# Patient Record
Sex: Female | Born: 1950
Health system: Southern US, Community
[De-identification: ages and names within clinical notes are randomized; demographics above are authoritative.]

## PROBLEM LIST (undated history)

## (undated) DIAGNOSIS — C439 Malignant melanoma of skin, unspecified: Secondary | ICD-10-CM

## (undated) DIAGNOSIS — IMO0002 Reserved for concepts with insufficient information to code with codable children: Secondary | ICD-10-CM

## (undated) DIAGNOSIS — C4491 Basal cell carcinoma of skin, unspecified: Secondary | ICD-10-CM

## (undated) DIAGNOSIS — K602 Anal fissure, unspecified: Secondary | ICD-10-CM

## (undated) DIAGNOSIS — F419 Anxiety disorder, unspecified: Secondary | ICD-10-CM

## (undated) DIAGNOSIS — I341 Nonrheumatic mitral (valve) prolapse: Secondary | ICD-10-CM

## (undated) DIAGNOSIS — M545 Low back pain, unspecified: Secondary | ICD-10-CM

## (undated) DIAGNOSIS — L719 Rosacea, unspecified: Secondary | ICD-10-CM

## (undated) DIAGNOSIS — K5790 Diverticulosis of intestine, part unspecified, without perforation or abscess without bleeding: Secondary | ICD-10-CM

## (undated) DIAGNOSIS — E876 Hypokalemia: Secondary | ICD-10-CM

## (undated) DIAGNOSIS — D649 Anemia, unspecified: Secondary | ICD-10-CM

## (undated) HISTORY — PX: VEIN SURGERY: SHX48

## (undated) HISTORY — DX: Hypokalemia: E87.6

## (undated) HISTORY — DX: Anxiety disorder, unspecified: F41.9

## (undated) HISTORY — PX: BREAST BIOPSY: SHX20

## (undated) HISTORY — DX: Basal cell carcinoma of skin, unspecified: C44.91

## (undated) HISTORY — DX: Diverticulosis of intestine, part unspecified, without perforation or abscess without bleeding: K57.90

## (undated) HISTORY — DX: Anal fissure, unspecified: K60.2

## (undated) HISTORY — DX: Reserved for concepts with insufficient information to code with codable children: IMO0002

## (undated) HISTORY — DX: Low back pain, unspecified: M54.50

## (undated) HISTORY — DX: Malignant melanoma of skin, unspecified: C43.9

## (undated) HISTORY — DX: Nonrheumatic mitral (valve) prolapse: I34.1

## (undated) HISTORY — DX: Rosacea, unspecified: L71.9

## (undated) HISTORY — PX: TUBAL LIGATION: SHX77

## (undated) HISTORY — DX: Anemia, unspecified: D64.9

## (undated) HISTORY — DX: Low back pain: M54.5

---
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
## 1983-02-10 HISTORY — PX: ABDOMINAL HYSTERECTOMY: SHX81

## 1987-02-10 HISTORY — PX: REPAIR KNEE LIGAMENT: SUR1188

## 1988-02-10 DIAGNOSIS — I341 Nonrheumatic mitral (valve) prolapse: Secondary | ICD-10-CM

## 1988-02-10 HISTORY — DX: Nonrheumatic mitral (valve) prolapse: I34.1

## 1992-02-10 HISTORY — PX: OOPHORECTOMY: SHX86

## 1997-08-29 ENCOUNTER — Other Ambulatory Visit: Admission: RE | Admit: 1997-08-29 | Discharge: 1997-08-29 | Payer: Self-pay | Admitting: Gynecology

## 1997-10-08 ENCOUNTER — Ambulatory Visit (HOSPITAL_COMMUNITY): Admission: RE | Admit: 1997-10-08 | Discharge: 1997-10-08 | Payer: Self-pay | Admitting: Otolaryngology

## 1999-08-19 ENCOUNTER — Other Ambulatory Visit: Admission: RE | Admit: 1999-08-19 | Discharge: 1999-08-19 | Payer: Self-pay | Admitting: Gynecology

## 2002-01-25 ENCOUNTER — Encounter: Admission: RE | Admit: 2002-01-25 | Discharge: 2002-01-25 | Payer: Self-pay | Admitting: Obstetrics & Gynecology

## 2002-01-25 ENCOUNTER — Encounter (INDEPENDENT_AMBULATORY_CARE_PROVIDER_SITE_OTHER): Payer: Self-pay | Admitting: *Deleted

## 2002-01-25 ENCOUNTER — Encounter: Payer: Self-pay | Admitting: Obstetrics & Gynecology

## 2002-01-30 ENCOUNTER — Encounter: Admission: RE | Admit: 2002-01-30 | Discharge: 2002-01-30 | Payer: Self-pay | Admitting: Obstetrics & Gynecology

## 2002-01-30 ENCOUNTER — Encounter: Payer: Self-pay | Admitting: Obstetrics & Gynecology

## 2002-02-22 ENCOUNTER — Encounter: Admission: RE | Admit: 2002-02-22 | Discharge: 2002-02-22 | Payer: Self-pay | Admitting: Obstetrics & Gynecology

## 2002-02-22 ENCOUNTER — Encounter: Payer: Self-pay | Admitting: Obstetrics & Gynecology

## 2002-04-13 ENCOUNTER — Encounter: Payer: Self-pay | Admitting: Internal Medicine

## 2002-04-13 DIAGNOSIS — K5730 Diverticulosis of large intestine without perforation or abscess without bleeding: Secondary | ICD-10-CM | POA: Insufficient documentation

## 2002-09-19 ENCOUNTER — Other Ambulatory Visit: Admission: RE | Admit: 2002-09-19 | Discharge: 2002-09-19 | Payer: Self-pay | Admitting: Gynecology

## 2004-03-03 ENCOUNTER — Ambulatory Visit: Payer: Self-pay | Admitting: Internal Medicine

## 2004-07-11 ENCOUNTER — Ambulatory Visit: Payer: Self-pay | Admitting: Family Medicine

## 2004-07-29 ENCOUNTER — Ambulatory Visit: Payer: Self-pay | Admitting: Family Medicine

## 2005-09-07 ENCOUNTER — Ambulatory Visit: Payer: Self-pay | Admitting: Family Medicine

## 2005-09-24 ENCOUNTER — Ambulatory Visit: Payer: Self-pay | Admitting: Family Medicine

## 2005-09-25 ENCOUNTER — Ambulatory Visit: Payer: Self-pay | Admitting: Internal Medicine

## 2005-10-01 ENCOUNTER — Ambulatory Visit: Payer: Self-pay | Admitting: Family Medicine

## 2005-10-04 ENCOUNTER — Emergency Department (HOSPITAL_COMMUNITY): Admission: EM | Admit: 2005-10-04 | Discharge: 2005-10-04 | Payer: Self-pay | Admitting: Emergency Medicine

## 2005-10-05 ENCOUNTER — Ambulatory Visit: Payer: Self-pay | Admitting: Family Medicine

## 2005-10-16 ENCOUNTER — Ambulatory Visit: Payer: Self-pay | Admitting: Internal Medicine

## 2005-10-26 ENCOUNTER — Ambulatory Visit: Payer: Self-pay | Admitting: Cardiovascular Disease

## 2005-11-23 ENCOUNTER — Ambulatory Visit: Payer: Self-pay | Admitting: Internal Medicine

## 2006-04-27 ENCOUNTER — Ambulatory Visit: Payer: Self-pay | Admitting: Family Medicine

## 2006-06-24 ENCOUNTER — Ambulatory Visit: Payer: Self-pay | Admitting: Family Medicine

## 2006-09-07 ENCOUNTER — Telehealth (INDEPENDENT_AMBULATORY_CARE_PROVIDER_SITE_OTHER): Payer: Self-pay | Admitting: *Deleted

## 2006-09-08 ENCOUNTER — Ambulatory Visit (HOSPITAL_COMMUNITY): Admission: RE | Admit: 2006-09-08 | Discharge: 2006-09-08 | Payer: Self-pay | Admitting: Internal Medicine

## 2006-09-08 ENCOUNTER — Ambulatory Visit: Payer: Self-pay | Admitting: Internal Medicine

## 2006-09-09 ENCOUNTER — Inpatient Hospital Stay (HOSPITAL_COMMUNITY): Admission: EM | Admit: 2006-09-09 | Discharge: 2006-09-12 | Payer: Self-pay | Admitting: Emergency Medicine

## 2006-09-09 LAB — CONVERTED CEMR LAB
ALT: 13 units/L (ref 0–35)
AST: 15 units/L (ref 0–37)
Albumin: 4 g/dL (ref 3.5–5.2)
Alkaline Phosphatase: 72 units/L (ref 39–117)
Amylase: 69 units/L (ref 27–131)
BUN: 8 mg/dL (ref 6–23)
Basophils Absolute: 0.2 10*3/uL — ABNORMAL HIGH (ref 0.0–0.1)
Basophils Relative: 1.8 % — ABNORMAL HIGH (ref 0.0–1.0)
Bilirubin Urine: NEGATIVE
Bilirubin, Direct: 0.1 mg/dL (ref 0.0–0.3)
CO2: 32 meq/L (ref 19–32)
Calcium: 10.1 mg/dL (ref 8.4–10.5)
Chloride: 101 meq/L (ref 96–112)
Creatinine, Ser: 0.7 mg/dL (ref 0.4–1.2)
Eosinophils Absolute: 0 10*3/uL (ref 0.0–0.6)
Eosinophils Relative: 0.1 % (ref 0.0–5.0)
GFR calc Af Amer: 111 mL/min
GFR calc non Af Amer: 92 mL/min
Glucose, Bld: 120 mg/dL — ABNORMAL HIGH (ref 70–99)
HCT: 37.3 % (ref 36.0–46.0)
Hemoglobin, Urine: NEGATIVE
Hemoglobin: 13 g/dL (ref 12.0–15.0)
Ketones, ur: 15 mg/dL — AB
Leukocytes, UA: NEGATIVE
Lymphocytes Relative: 12.4 % (ref 12.0–46.0)
MCHC: 34.8 g/dL (ref 30.0–36.0)
MCV: 93.3 fL (ref 78.0–100.0)
Monocytes Absolute: 0.9 10*3/uL — ABNORMAL HIGH (ref 0.2–0.7)
Monocytes Relative: 8.5 % (ref 3.0–11.0)
Neutro Abs: 8.2 10*3/uL — ABNORMAL HIGH (ref 1.4–7.7)
Neutrophils Relative %: 77.2 % — ABNORMAL HIGH (ref 43.0–77.0)
Nitrite: NEGATIVE
Platelets: 246 10*3/uL (ref 150–400)
Potassium: 3.9 meq/L (ref 3.5–5.1)
RBC / HPF: NONE SEEN
RBC: 4 M/uL (ref 3.87–5.11)
RDW: 11.4 % — ABNORMAL LOW (ref 11.5–14.6)
Sodium: 140 meq/L (ref 135–145)
Specific Gravity, Urine: 1.025 (ref 1.000–1.03)
Total Bilirubin: 0.6 mg/dL (ref 0.3–1.2)
Total Protein, Urine: NEGATIVE mg/dL
Total Protein: 7.5 g/dL (ref 6.0–8.3)
Urine Glucose: NEGATIVE mg/dL
Urobilinogen, UA: 0.2 (ref 0.0–1.0)
WBC: 10.6 10*3/uL — ABNORMAL HIGH (ref 4.5–10.5)
pH: 6 (ref 5.0–8.0)

## 2006-09-11 ENCOUNTER — Ambulatory Visit: Payer: Self-pay | Admitting: Internal Medicine

## 2006-09-12 ENCOUNTER — Encounter (INDEPENDENT_AMBULATORY_CARE_PROVIDER_SITE_OTHER): Payer: Self-pay | Admitting: *Deleted

## 2006-09-15 ENCOUNTER — Ambulatory Visit: Payer: Self-pay | Admitting: Gastroenterology

## 2006-09-15 ENCOUNTER — Ambulatory Visit: Payer: Self-pay | Admitting: Family Medicine

## 2006-09-20 ENCOUNTER — Ambulatory Visit: Payer: Self-pay | Admitting: Family Medicine

## 2006-09-20 ENCOUNTER — Telehealth: Payer: Self-pay | Admitting: Family Medicine

## 2006-09-20 DIAGNOSIS — I4949 Other premature depolarization: Secondary | ICD-10-CM | POA: Insufficient documentation

## 2006-09-24 ENCOUNTER — Ambulatory Visit: Payer: Self-pay | Admitting: Internal Medicine

## 2006-09-27 ENCOUNTER — Ambulatory Visit: Payer: Self-pay | Admitting: Family Medicine

## 2006-09-27 LAB — CONVERTED CEMR LAB
AST: 15 units/L (ref 0–37)
Albumin: 4.1 g/dL (ref 3.5–5.2)
Basophils Absolute: 0 10*3/uL (ref 0.0–0.1)
Basophils Relative: 0.4 % (ref 0.0–1.0)
Bilirubin Urine: NEGATIVE
Bilirubin, Direct: 0.1 mg/dL (ref 0.0–0.3)
CO2: 33 meq/L — ABNORMAL HIGH (ref 19–32)
Eosinophils Absolute: 0 10*3/uL (ref 0.0–0.6)
Eosinophils Relative: 0.7 % (ref 0.0–5.0)
GFR calc Af Amer: 111 mL/min
HDL: 49.8 mg/dL (ref >39.0)
MCV: 94.2 fL (ref 78.0–100.0)
Neutrophils Relative %: 56 % (ref 43.0–77.0)
RBC: 3.67 M/uL — ABNORMAL LOW (ref 3.87–5.11)
TSH: 0.8 microintl units/mL (ref 0.35–5.50)
Total Bilirubin: 0.9 mg/dL (ref 0.3–1.2)
Total CHOL/HDL Ratio: 3.5
Triglycerides: 97 mg/dL (ref 0–149)
WBC: 6.5 10*3/uL (ref 4.5–10.5)
pH: 6

## 2006-11-11 ENCOUNTER — Ambulatory Visit: Payer: Self-pay | Admitting: Family Medicine

## 2007-04-06 ENCOUNTER — Telehealth (INDEPENDENT_AMBULATORY_CARE_PROVIDER_SITE_OTHER): Payer: Self-pay | Admitting: *Deleted

## 2007-04-07 ENCOUNTER — Ambulatory Visit: Payer: Self-pay | Admitting: Family Medicine

## 2007-04-07 DIAGNOSIS — L578 Other skin changes due to chronic exposure to nonionizing radiation: Secondary | ICD-10-CM

## 2007-04-07 DIAGNOSIS — J309 Allergic rhinitis, unspecified: Secondary | ICD-10-CM | POA: Insufficient documentation

## 2007-05-14 DIAGNOSIS — G43909 Migraine, unspecified, not intractable, without status migrainosus: Secondary | ICD-10-CM

## 2007-05-14 DIAGNOSIS — K589 Irritable bowel syndrome without diarrhea: Secondary | ICD-10-CM | POA: Insufficient documentation

## 2007-05-14 DIAGNOSIS — Z8679 Personal history of other diseases of the circulatory system: Secondary | ICD-10-CM | POA: Insufficient documentation

## 2007-09-28 ENCOUNTER — Ambulatory Visit: Payer: Self-pay | Admitting: Family Medicine

## 2007-09-28 LAB — CONVERTED CEMR LAB
BUN: 10 mg/dL (ref 6–23)
Basophils Absolute: 0 10*3/uL (ref 0.0–0.1)
Basophils Relative: 0.2 % (ref 0.0–3.0)
Bilirubin Urine: NEGATIVE
Bilirubin, Direct: 0.1 mg/dL (ref 0.0–0.3)
Blood in Urine, dipstick: NEGATIVE
Creatinine, Ser: 0.8 mg/dL (ref 0.4–1.2)
GFR calc Af Amer: 94 mL/min
HCT: 39.2 % (ref 36.0–46.0)
Lymphocytes Relative: 17.3 % (ref 12.0–46.0)
MCHC: 34.3 g/dL (ref 30.0–36.0)
MCV: 96.2 fL (ref 78.0–100.0)
Monocytes Absolute: 0.9 10*3/uL (ref 0.1–1.0)
Neutro Abs: 5.4 10*3/uL (ref 1.4–7.7)
Neutrophils Relative %: 70.7 % (ref 43.0–77.0)
Nitrite: NEGATIVE
Potassium: 4.3 meq/L (ref 3.5–5.1)
Sodium: 142 meq/L (ref 135–145)
TSH: 0.79 microintl units/mL (ref 0.35–5.50)
Total Bilirubin: 0.7 mg/dL (ref 0.3–1.2)
VLDL: 27 mg/dL (ref 0–40)
WBC Urine, dipstick: NEGATIVE
pH: 7

## 2007-10-03 ENCOUNTER — Telehealth: Payer: Self-pay | Admitting: Internal Medicine

## 2007-11-09 ENCOUNTER — Encounter: Payer: Self-pay | Admitting: Family Medicine

## 2007-11-09 ENCOUNTER — Ambulatory Visit: Payer: Self-pay | Admitting: Family Medicine

## 2007-11-14 ENCOUNTER — Ambulatory Visit: Payer: Self-pay | Admitting: Family Medicine

## 2008-02-09 ENCOUNTER — Telehealth: Payer: Self-pay | Admitting: Internal Medicine

## 2008-02-17 ENCOUNTER — Ambulatory Visit: Payer: Self-pay | Admitting: Internal Medicine

## 2008-02-20 ENCOUNTER — Ambulatory Visit: Payer: Self-pay | Admitting: Cardiology

## 2008-02-21 ENCOUNTER — Ambulatory Visit: Payer: Self-pay | Admitting: Internal Medicine

## 2008-02-21 LAB — CONVERTED CEMR LAB
BUN: 16 mg/dL (ref 6–23)
Basophils Relative: 0.1 % (ref 0.0–3.0)
Calcium Ionized: 1.33 mmol/L — ABNORMAL HIGH (ref 1.12–1.32)
Calcium: 10.6 mg/dL — ABNORMAL HIGH (ref 8.4–10.5)
Chloride: 101 meq/L (ref 96–112)
Creatinine, Ser: 0.6 mg/dL (ref 0.4–1.2)
Eosinophils Absolute: 0.1 10*3/uL (ref 0.0–0.7)
Eosinophils Relative: 0.8 % (ref 0.0–5.0)
GFR calc Af Amer: 132 mL/min
Glucose, Bld: 107 mg/dL — ABNORMAL HIGH (ref 70–99)
Hemoglobin: 13.5 g/dL (ref 12.0–15.0)
MCHC: 34.9 g/dL (ref 30.0–36.0)
MCV: 94.6 fL (ref 78.0–100.0)
Monocytes Relative: 7.1 % (ref 3.0–12.0)
Neutro Abs: 8.4 10*3/uL — ABNORMAL HIGH (ref 1.4–7.7)
Platelets: 300 10*3/uL (ref 150–400)
Potassium: 4.4 meq/L (ref 3.5–5.1)
RBC: 4.1 M/uL (ref 3.87–5.11)

## 2008-02-22 ENCOUNTER — Telehealth: Payer: Self-pay | Admitting: Gastroenterology

## 2008-02-22 ENCOUNTER — Telehealth: Payer: Self-pay | Admitting: Internal Medicine

## 2008-02-29 ENCOUNTER — Telehealth: Payer: Self-pay | Admitting: Internal Medicine

## 2008-03-01 ENCOUNTER — Ambulatory Visit: Payer: Self-pay | Admitting: Internal Medicine

## 2008-03-02 ENCOUNTER — Telehealth: Payer: Self-pay | Admitting: Internal Medicine

## 2008-03-08 ENCOUNTER — Encounter (INDEPENDENT_AMBULATORY_CARE_PROVIDER_SITE_OTHER): Payer: Self-pay | Admitting: *Deleted

## 2008-03-08 ENCOUNTER — Emergency Department (HOSPITAL_COMMUNITY): Admission: EM | Admit: 2008-03-08 | Discharge: 2008-03-08 | Payer: Self-pay | Admitting: Emergency Medicine

## 2008-03-09 ENCOUNTER — Telehealth: Payer: Self-pay | Admitting: Internal Medicine

## 2008-04-16 ENCOUNTER — Ambulatory Visit: Payer: Self-pay | Admitting: Internal Medicine

## 2008-04-19 ENCOUNTER — Ambulatory Visit: Payer: Self-pay | Admitting: Internal Medicine

## 2008-08-17 ENCOUNTER — Encounter: Admission: RE | Admit: 2008-08-17 | Discharge: 2008-08-17 | Payer: Self-pay | Admitting: Obstetrics and Gynecology

## 2008-08-27 ENCOUNTER — Encounter: Admission: RE | Admit: 2008-08-27 | Discharge: 2008-08-27 | Payer: Self-pay | Admitting: Obstetrics and Gynecology

## 2008-11-06 ENCOUNTER — Telehealth: Payer: Self-pay | Admitting: Internal Medicine

## 2008-11-08 ENCOUNTER — Telehealth: Payer: Self-pay | Admitting: Internal Medicine

## 2008-11-09 ENCOUNTER — Ambulatory Visit: Payer: Self-pay | Admitting: Gastroenterology

## 2008-11-09 LAB — CONVERTED CEMR LAB
Basophils Absolute: 0 10*3/uL (ref 0.0–0.1)
MCHC: 34 g/dL (ref 30.0–36.0)
Neutro Abs: 8.1 10*3/uL — ABNORMAL HIGH (ref 1.4–7.7)
Platelets: 274 10*3/uL (ref 150.0–400.0)
RDW: 12.1 % (ref 11.5–14.6)

## 2008-11-16 ENCOUNTER — Ambulatory Visit: Payer: Self-pay | Admitting: Family Medicine

## 2008-11-16 LAB — CONVERTED CEMR LAB
AST: 18 units/L (ref 0–37)
Alkaline Phosphatase: 55 units/L (ref 39–117)
BUN: 15 mg/dL (ref 6–23)
Basophils Relative: 0.2 % (ref 0.0–3.0)
Bilirubin, Direct: 0.1 mg/dL (ref 0.0–0.3)
Blood in Urine, dipstick: NEGATIVE
Calcium: 9.9 mg/dL (ref 8.4–10.5)
Chloride: 104 meq/L (ref 96–112)
Creatinine, Ser: 0.7 mg/dL (ref 0.4–1.2)
Glucose, Bld: 93 mg/dL (ref 70–99)
HCT: 40.4 % (ref 36.0–46.0)
Lymphocytes Relative: 31.2 % (ref 12.0–46.0)
Lymphs Abs: 1.8 10*3/uL (ref 0.7–4.0)
Monocytes Relative: 7.5 % (ref 3.0–12.0)
Neutro Abs: 3.5 10*3/uL (ref 1.4–7.7)
Neutrophils Relative %: 60 % (ref 43.0–77.0)
Nitrite: NEGATIVE
Platelets: 243 10*3/uL (ref 150.0–400.0)
RDW: 11.9 % (ref 11.5–14.6)
TSH: 0.68 microintl units/mL (ref 0.35–5.50)
Total Bilirubin: 0.8 mg/dL (ref 0.3–1.2)
Total CHOL/HDL Ratio: 3
Total Protein: 7.6 g/dL (ref 6.0–8.3)
Triglycerides: 85 mg/dL (ref 0.0–149.0)
VLDL: 17 mg/dL (ref 0.0–40.0)
WBC: 5.8 10*3/uL (ref 4.5–10.5)
pH: 7

## 2008-11-30 ENCOUNTER — Ambulatory Visit: Payer: Self-pay | Admitting: Family Medicine

## 2009-08-27 ENCOUNTER — Encounter: Admission: RE | Admit: 2009-08-27 | Discharge: 2009-08-27 | Payer: Self-pay | Admitting: Obstetrics and Gynecology

## 2009-09-20 ENCOUNTER — Encounter: Admission: RE | Admit: 2009-09-20 | Discharge: 2009-09-20 | Payer: Self-pay | Admitting: Obstetrics and Gynecology

## 2009-11-26 ENCOUNTER — Ambulatory Visit: Payer: Self-pay | Admitting: Family Medicine

## 2009-11-26 LAB — CONVERTED CEMR LAB
ALT: 11 units/L (ref 0–35)
AST: 17 units/L (ref 0–37)
Basophils Absolute: 0 10*3/uL (ref 0.0–0.1)
CO2: 32 meq/L (ref 19–32)
Chloride: 104 meq/L (ref 96–112)
Cholesterol: 163 mg/dL (ref 0–200)
Eosinophils Relative: 1.3 % (ref 0.0–5.0)
GFR calc non Af Amer: 84.75 mL/min (ref >60)
Glucose, Urine, Semiquant: NEGATIVE
HDL: 55.7 mg/dL (ref >39.00)
Hemoglobin: 12 g/dL (ref 12.0–15.0)
Ketones, urine, test strip: NEGATIVE
LDL Cholesterol: 90 mg/dL (ref 0–99)
Lymphocytes Relative: 33.5 % (ref 12.0–46.0)
MCHC: 33.8 g/dL (ref 30.0–36.0)
MCV: 96.2 fL (ref 78.0–100.0)
Monocytes Absolute: 0.5 10*3/uL (ref 0.1–1.0)
Monocytes Relative: 9 % (ref 3.0–12.0)
Neutro Abs: 3.4 10*3/uL (ref 1.4–7.7)
Neutrophils Relative %: 55.6 % (ref 43.0–77.0)
Platelets: 240 10*3/uL (ref 150.0–400.0)
RBC: 3.69 M/uL — ABNORMAL LOW (ref 3.87–5.11)
RDW: 13.2 % (ref 11.5–14.6)
Triglycerides: 86 mg/dL (ref 0.0–149.0)
pH: 7

## 2009-12-04 ENCOUNTER — Telehealth: Payer: Self-pay | Admitting: Family Medicine

## 2009-12-09 ENCOUNTER — Ambulatory Visit: Payer: Self-pay | Admitting: Family Medicine

## 2009-12-09 ENCOUNTER — Encounter: Payer: Self-pay | Admitting: Family Medicine

## 2009-12-09 ENCOUNTER — Telehealth: Payer: Self-pay | Admitting: Family Medicine

## 2009-12-10 ENCOUNTER — Encounter: Admission: RE | Admit: 2009-12-10 | Discharge: 2009-12-10 | Payer: Self-pay | Admitting: Gynecology

## 2010-03-11 NOTE — Assessment & Plan Note (Signed)
Summary: cpx/cjr   Vital Signs:  Patient profile:   60 year old female Height:      61 inches Weight:      117 pounds BMI:     22.19 Temp:     97.8 degrees F oral BP sitting:   120 / 78  (left arm) Cuff size:   regular  Vitals Entered By: Kern Reap CMA Duncan Dull) (December 09, 2009 2:20 PM) CC: cpox Is Patient Diabetic? No   Primary Care Provider:  Kelle Darting, MD  CC:  cpox.  History of Present Illness: [name] is a 60 year old, married female, nonsmoker, who comes in today for evaluation.  A couple weeks ago she noted a black spot on her left anterior chest wall.  She went to see her dermatologist, Dr. Mayford Knife and he removed the lesion..the path   showed it to be.  malignant with some superficial margins involved.  She subsequently went and had wide excision.  This past Friday.  She's always had a light skin and light eyes and a lot of sex push her in the past.  Has not worn her sunscreens religiously.  She will in the future  She also had a mammogram a couple weeks ago.  She is on recall because of a shadow in her left breast.  She feels a lump.  It's tender movable pain.  She continues to take estrogen from Dr. Greta Doom her GYN.  Advised to stop the estrogen.  She gets routine eye care, dental care, BSE monthly, and your mammography, colonoscopy, normal, and GI, intermittent bright red rectal bleeding with constipation.  Normal colonoscopy 4 years ago.  Tetanus 2003  Allergies: 1)  ! Sulfa 2)  ! * Combid 3)  ! * "glyc..." 4)  ! Prednisone (Prednisone) 5)  ! Codeine  Past History:  Past medical, surgical, family and social histories (including risk factors) reviewed, and no changes noted (except as noted below).  Past Medical History: Reviewed history from 11/09/2008 and no changes required. MVP/PSA Rosacea  Low back pain Hypokalemia migraine headaches hysterectomy in 1985, DU B. torn ligament left knee childbirth x 2 BTL 80 tonsillectomy 53 Anxiety  Past  Surgical History: Reviewed history from 03/01/2008 and no changes required. Vein Surgery Lt. Breast Biopsy; secondary to infection Hysterectomy (1985) Tonsillectomy (1955) Tubal ligation (1982) Lt. Oophorectomy (1993) Lt. knee; torn ligament (1989) colonoscopy  Family History: Reviewed history from 11/09/2008 and no changes required.  no brothers or sisters No FH of Colon Cancer: Family History of Colitis/Crohn's: Father (colitis) Family History of Heart Disease: Mother, Father  Social History: Reviewed history from 03/01/2008 and no changes required. Occupation: [employer] Services operational officer Married Never Smoked Alcohol use-yes-rare Daily Caffeine Use-1 cup daily Illicit Drug Use - no Patient does not get regular exercise.   Review of Systems      See HPI  Physical Exam  General:  Well-developed,well-nourished,in no acute distress; alert,appropriate and cooperative throughout examination Head:  Normocephalic and atraumatic without obvious abnormalities. No apparent alopecia or balding. Eyes:  No corneal or conjunctival inflammation noted. EOMI. Perrla. Funduscopic exam benign, without hemorrhages, exudates or papilledema. Vision grossly normal. Ears:  External ear exam shows no significant lesions or deformities.  Otoscopic examination reveals clear canals, tympanic membranes are intact bilaterally without bulging, retraction, inflammation or discharge. Hearing is grossly normal bilaterally. Nose:  External nasal examination shows no deformity or inflammation. Nasal mucosa are pink and moist without lesions or exudates. Mouth:  Oral mucosa and oropharynx without lesions or exudates.  Teeth  in good repair. Neck:  No deformities, masses, or tenderness noted. Chest Wall:  No deformities, masses, or tenderness noted. Breasts:  right breast normal left breast normal except for a marble size, soft, movable lesion, an inch from the nipple at the 4 o'clock position.  Left breast Lungs:  Normal  respiratory effort, chest expands symmetrically. Lungs are clear to auscultation, no crackles or wheezes. Heart:  Normal rate and regular rhythm. S1 and S2 normal without gallop, murmur, click, rub or other extra sounds. Abdomen:  Bowel sounds positive,abdomen soft and non-tender without masses, organomegaly or hernias noted. Msk:  No deformity or scoliosis noted of thoracic or lumbar spine.   Pulses:  R and L carotid,radial,femoral,dorsalis pedis and posterior tibial pulses are full and equal bilaterally Extremities:  No clubbing, cyanosis, edema, or deformity noted with normal full range of motion of all joints.   Neurologic:  No cranial nerve deficits noted. Station and gait are normal. Plantar reflexes are down-going bilaterally. DTRs are symmetrical throughout. Sensory, motor and coordinative functions appear intact. Skin:  total body skin exam normal except for a Band-Aid.  Left upper chest wall, where she's had the melanoma removed Cervical Nodes:  No lymphadenopathy noted Axillary Nodes:  No palpable lymphadenopathy Inguinal Nodes:  No significant adenopathy Psych:  Cognition and judgment appear intact. Alert and cooperative with normal attention span and concentration. No apparent delusions, illusions, hallucinations   Impression & Recommendations:  Problem # 1:  DERMATITIS DUE TO SOLAR RADIATION (ICD-692.79) Assessment Unchanged  Orders: Prescription Created Electronically [phone])  Problem # 2:  HEALTH SCREENING (ICD-V70.0) Assessment: Unchanged  Orders: Prescription Created Electronically [phone])  Complete Medication List: 1)  Atenolol 25 Mg Tabs (Atenolol) .Marland Kitchen.. 1 tab @ bedtime 2)  Robitussin Dm 100-10 Mg/55ml Syrp (Dextromethorphan-guaifenesin) .... As needed 3)  Fiorinal 50-325-40 Mg Caps (Butalbital-aspirin-caffeine) .... One q6h as needed migraine 4)  Florastor 250 Mg Caps (Saccharomyces boulardii) .Marland Kitchen.. 1 tab once daily 5)  Cephalexin 500 Mg Caps (Cephalexin) .... Use  as directed 6)  Vivelle-dot 0.0375 Mg/24hr Pttw (Estradiol) .... Use as directed 7)  Ra Calcium Plus Vitamin D 600-125 Mg-unit Tabs (Calcium-vitamin d) .... Take one tab by mouth once daily 8)  Vitamin E 100 Unit Caps (Vitamin e) .... Once daily 9)  Vit C  .... Once daily 10)  B Complex-b12 Tabs (B complex vitamins) .... Take one tab by mouth once daily  Patient Instructions: 1)  I would recommend that you stop the estrogen. 2)  Please schedule a follow-up appointment in 3 months.for complete skin exam 3)  It is important that you exercise regularly at least 20 minutes 5 times a week. If you develop chest pain, have severe difficulty breathing, or feel very tired , stop exercising immediately and seek medical attention. 4)  Schedule your mammogram. 5)  Schedule a colonoscopy/sigmoidoscopy to help detect colon cancer. 6)  Take calcium +Vitamin D daily. 7)  Take an Aspirin every day.   Orders Added: 1)  Prescription Created Electronically [[id number]] 2)  Est. Patient 40-64 years [phone]

## 2010-03-11 NOTE — Progress Notes (Signed)
Summary: fyi  Phone Note Call from Patient Call back at Work Phone [phone]   Caller: Patient Summary of Call: Pt called req lab results. Pt is sch to have a melanoma removed on Friday and just wants to make sure everything is ok.    Initial call taken by: Lucy Antigua,  December 04, 2009 8:39 AM  Follow-up for Phone Call        Fleet Contras please call labs normal Follow-up by: Roderick Pee MD,  December 04, 2009 10:24 AM  Additional Follow-up for Phone Call Additional follow up Details #1::        patient is going to see dr Kelly Splinter on friday for bx. Additional Follow-up by: Kern Reap CMA Duncan Dull),  December 04, 2009 11:57 AM

## 2010-03-11 NOTE — Progress Notes (Signed)
  Prescriptions: FIORINAL 50-325-40 MG  CAPS (BUTALBITAL-ASPIRIN-CAFFEINE) one q6h as needed migraine  #30 x 2   Entered and Authorized by:   Roderick Pee MD   Signed by:   Roderick Pee MD on 12/09/2009   Method used:   Print then Give to Patient   RxID:   [id number] ATENOLOL 25 MG  TABS (ATENOLOL) 1 tab @ bedtime  #100 x 3   Entered and Authorized by:   Roderick Pee MD   Signed by:   Roderick Pee MD on 12/09/2009   Method used:   Print then Give to Patient   RxID:   [id number]

## 2010-04-04 ENCOUNTER — Emergency Department (HOSPITAL_BASED_OUTPATIENT_CLINIC_OR_DEPARTMENT_OTHER)
Admission: EM | Admit: 2010-04-04 | Discharge: 2010-04-04 | Disposition: A | Discharge status: Home / Self Care | Payer: BC Managed Care – PPO | Attending: Emergency Medicine | Admitting: Emergency Medicine

## 2010-04-04 DIAGNOSIS — R51 Headache: Secondary | ICD-10-CM | POA: Insufficient documentation

## 2010-04-04 DIAGNOSIS — Y929 Unspecified place or not applicable: Secondary | ICD-10-CM | POA: Insufficient documentation

## 2010-04-04 DIAGNOSIS — M542 Cervicalgia: Secondary | ICD-10-CM | POA: Insufficient documentation

## 2010-04-04 DIAGNOSIS — M2569 Stiffness of other specified joint, not elsewhere classified: Secondary | ICD-10-CM | POA: Insufficient documentation

## 2010-04-04 DIAGNOSIS — Z79899 Other long term (current) drug therapy: Secondary | ICD-10-CM | POA: Insufficient documentation

## 2010-04-04 DIAGNOSIS — W08XXXA Fall from other furniture, initial encounter: Secondary | ICD-10-CM | POA: Insufficient documentation

## 2010-04-04 DIAGNOSIS — Y990 Civilian activity done for income or pay: Secondary | ICD-10-CM | POA: Insufficient documentation

## 2010-04-15 ENCOUNTER — Emergency Department (HOSPITAL_BASED_OUTPATIENT_CLINIC_OR_DEPARTMENT_OTHER)
Admission: EM | Admit: 2010-04-15 | Discharge: 2010-04-15 | Disposition: A | Discharge status: Home / Self Care | Payer: BC Managed Care – PPO | Attending: Emergency Medicine | Admitting: Emergency Medicine

## 2010-04-15 ENCOUNTER — Emergency Department (INDEPENDENT_AMBULATORY_CARE_PROVIDER_SITE_OTHER): Payer: BC Managed Care – PPO

## 2010-04-15 DIAGNOSIS — M5030 Other cervical disc degeneration, unspecified cervical region: Secondary | ICD-10-CM | POA: Insufficient documentation

## 2010-04-15 DIAGNOSIS — R51 Headache: Secondary | ICD-10-CM

## 2010-04-15 DIAGNOSIS — W19XXXA Unspecified fall, initial encounter: Secondary | ICD-10-CM

## 2010-04-15 DIAGNOSIS — M542 Cervicalgia: Secondary | ICD-10-CM

## 2010-05-14 ENCOUNTER — Other Ambulatory Visit: Payer: Self-pay | Admitting: Orthopedic Surgery

## 2010-05-14 DIAGNOSIS — M542 Cervicalgia: Secondary | ICD-10-CM

## 2010-05-15 ENCOUNTER — Other Ambulatory Visit: Payer: Self-pay | Admitting: Gastroenterology

## 2010-05-16 ENCOUNTER — Other Ambulatory Visit: Payer: BC Managed Care – PPO

## 2010-05-17 ENCOUNTER — Other Ambulatory Visit: Payer: BC Managed Care – PPO

## 2010-05-26 LAB — BASIC METABOLIC PANEL
CO2: 24 mEq/L (ref 19–32)
Glucose, Bld: 120 mg/dL — ABNORMAL HIGH (ref 70–99)
Potassium: 3.9 mEq/L (ref 3.5–5.1)

## 2010-06-03 ENCOUNTER — Other Ambulatory Visit: Payer: Self-pay | Admitting: Gynecology

## 2010-06-03 DIAGNOSIS — Z09 Encounter for follow-up examination after completed treatment for conditions other than malignant neoplasm: Secondary | ICD-10-CM

## 2010-06-10 ENCOUNTER — Ambulatory Visit: Payer: No Typology Code available for payment source | Attending: Orthopedic Surgery | Admitting: Physical Therapy

## 2010-06-10 ENCOUNTER — Ambulatory Visit
Admission: RE | Admit: 2010-06-10 | Discharge: 2010-06-10 | Disposition: A | Discharge status: Home / Self Care | Payer: BC Managed Care – PPO | Source: Ambulatory Visit | Attending: Gynecology | Admitting: Gynecology

## 2010-06-10 DIAGNOSIS — M542 Cervicalgia: Secondary | ICD-10-CM | POA: Insufficient documentation

## 2010-06-10 DIAGNOSIS — Z09 Encounter for follow-up examination after completed treatment for conditions other than malignant neoplasm: Secondary | ICD-10-CM

## 2010-06-10 DIAGNOSIS — X58XXXA Exposure to other specified factors, initial encounter: Secondary | ICD-10-CM | POA: Insufficient documentation

## 2010-06-10 DIAGNOSIS — IMO0001 Reserved for inherently not codable concepts without codable children: Secondary | ICD-10-CM | POA: Insufficient documentation

## 2010-06-10 DIAGNOSIS — M2569 Stiffness of other specified joint, not elsewhere classified: Secondary | ICD-10-CM | POA: Insufficient documentation

## 2010-06-23 ENCOUNTER — Ambulatory Visit: Payer: No Typology Code available for payment source | Admitting: Physical Therapy

## 2010-06-24 NOTE — Assessment & Plan Note (Signed)
[facility]                                 ON-CALL NOTE   [name], [name]                          MRN:          [id number]  DATE:09/14/2006                            DOB:          11-13-1950    Phone [phone]   Dr. Nelida Meuse patient   Patient was recently hospitalized for severe diarrhea and discharged 2  days ago after receiving a good amount of IV fluids, and is on 2  antibiotics, Cipro and Flagyl.  She had some leg edema when she was  discharged, and Dr. Juanda Chance and Dr. Arlyce Dice had noted it, and felt it was  secondary to her IV fluids.  However, today she noticed from her knee up  to her thigh, it was severely swollen without significant pain, chest  pain, or shortness of breath.  Her diarrhea is much better.  She is  concerned about her leg swelling.  Her hospital admission weight was  209, and she now weighs 225.  We discussed this as probably fluid  overload, and she needs to be seen earlier than the 2 weeks post  hospitalization, to call or come in early in the morning to see Dr.  Tawanna Cooler.  Call back on alarmed symptoms such as chest pain, shortness of  breath, severe cough.     Neta Mends. Panosh, MD  Electronically Signed    WKP/MedQ  DD: 09/15/2006  DT: 09/15/2006  Job #: [id number]

## 2010-06-24 NOTE — Assessment & Plan Note (Signed)
[facility]                         GASTROENTEROLOGY OFFICE NOTE   [name]                        MRN:          [id number]  DATE:09/24/2006                            DOB:          01/16/1951    [name] is a 60 year old white female with a history of irritable  bowel syndrome and a recent hospitalization at [facility] from  September 09, 2006 to September 12, 2006, for acute gastroenteritis and  infectious diarrhea.  She was treated with Cipro and Flagyl after  presenting with watery stool, crampy abdominal pain.  Her stool for C.  diff was negative as well as O&P.  Her stool lactoferrin was positive  but stool culture was not obtained.  After 3 days of hydration, the  patient was discharged on a low residue diet and has done quite well.  She is about 75-80% improved.  Stools are now formed.  There is no  fever.   PHYSICAL EXAMINATION:  VITAL SIGNS:  Blood pressure 98/52, pulse 76, and  weight 109 pounds, usually weighs 120 pounds.  GENERAL:  She was alert, oriented, appeared healthy.  LUNGS:  Clear to auscultation.  COR:  Irregular heart beat.  ABDOMEN:  Soft, nontender with normoactive bowel sounds.  No distention.  No abnormal rashes.   IMPRESSION:  A 60 year old white female with status post infectious  diarrhea most likely salmonella.  Stool cultures were not obtained.  She  responded to Flagyl and Cipro.  Antibiotics have been discontinued and  she is doing well.   PLAN:  1. Continue to increase fiber in her diet.  2. No further antibiotics unless stools become loose again.  3. If diarrhea recurs obtain stool cultures for the usual pathogens.     Hedwig Morton. Juanda Chance, MD  Electronically Signed    DMB/MedQ  DD: 09/24/2006  DT: 09/24/2006  Job #: [id number]

## 2010-06-24 NOTE — Discharge Summary (Signed)
[name], [name]                 ACCOUNT NO.:  [id number]   MEDICAL RECORD NO.:  [id number]          PATIENT TYPE:  INP   LOCATION:  6734                         FACILITY:  [facility]   PHYSICIAN:  Bruce Rexene Edison. Swords, MD    DATE OF BIRTH:  November 27, 1950   DATE OF ADMISSION:  09/09/2006  DATE OF DISCHARGE:  09/12/2006                               DISCHARGE SUMMARY   DISCHARGE DIAGNOSES:  1. Acute gastroenteritis with diarrhea, nausea felt secondary to      infection (etiology unknown).  2. Migraine headaches.  3. Diverticulosis.   DISCHARGE MEDICATIONS:  1. Atenolol 12.5 mg p.o. daily.  2. Topamax 25 mg b.i.d.  3. Cipro 500 mg b.i.d. x3 days.  4. Flagyl 250 mg p.o. t.i.d. x3 days.   FOLLOWUP PLANS:  Dr. Tawanna Cooler in 2-3 weeks.   CONDITION ON DISCHARGE:  Improved   HOSPITAL LABORATORIES:  C. difficile toxins were negative.  Fecal  lactoferrin was positive.  CBC on September 11, 2006, with a hemoglobin of  10.9, white count 9.4, platelets 203.  Fecal occult blood negative.  Stool for Giardia and cryptosporidium negative.   PROCEDURES:  None.   CONSULTATIONS:  Gastroenterology.   HOSPITAL COURSE:  #1 - The patient was admitted to the hospital service  on September 09, 2006.  I saw the patient as an outpatient previous to that.  She had abdominal discomfort.  CT scan was obtained and was essentially  unremarkable.  She developed diarrhea after the CT scan.  She was  admitted for abdominal discomfort and diarrhea.  She has improved  dramatically.  She feels well, had one bowel movement this morning.  She  was eating a normal diet.  No further evaluation necessary.  #2 - MIGRAINE HEADACHES.  Not an issue in the hospital.  #3 - HISTORY OF HYPERTENSION.  Blood pressure well controlled.  Note:  She will resume her Atenolol because she takes that as migraine  prophylactic medication.      Bruce Rexene Edison Swords, MD  Electronically Signed     BHS/MEDQ  D:  09/12/2006  T:  09/12/2006  Job:  [id number]   cc:   Tinnie Gens A. Tawanna Cooler, MD

## 2010-06-24 NOTE — H&P (Signed)
[name], [name] NO.:  [id number]   MEDICAL RECORD NO.:  [id number]          PATIENT TYPE:  INP   LOCATION:  1828                         FACILITY:  [facility]   PHYSICIAN:  Hollice Espy, M.D.DATE OF BIRTH:  [birth year]   DATE OF ADMISSION:  09/09/2006  DATE OF DISCHARGE:                              HISTORY & PHYSICAL   The patient's PCP is Dr. Tawanna Rivera of [facility].   CHIEF COMPLAINT:  Abdominal pain and diarrhea.   HISTORY OF PRESENT ILLNESS:  The patient is a 60 year old white female  with past medical history of diverticulosis and hypertension who  starting 4 days ago along with her husband starting having complaints of  diarrhea. These symptoms persisted for several days, and finally, she  followed up with Dr. Nelida Rivera partner, Dr. Cato Rivera, as an outpatient. Dr.  Cato Rivera after examining her performed an abdominal exam, pushing hard on  her belly. She said since then she started having crampy abdominal pain  in all of her lower quadrants. She has also continued to have diarrhea.  She has noticed no blood, no diarrhea. She has had no belly pain in her  upper abdominal area. She has also had some nausea and vomiting. These  symptoms persisted. She was referred to radiology and received a CAT  scan which was done day prior on September 08, 2006. This CAT scan ended up  being completely normal. She was also put on Cipro and Flagyl for 1 day.  Her symptoms persisted with the abdominal pain and diarrhea, and she  came to the emergency room for further evaluation here at [facility]. In  the emergency room, she was noted to have a temperature of 101.2 and a  heart rate of 103. Her blood pressure is ranging anywhere from 95/54 to  110/69, respirations 20, O2 saturation 96% on room air. Labs were  ordered on the patient. She was found to have a white count of 10.1, but  she was noted to have a 92% shift. The rest of her labs were essentially  unremarkable.   The patient was  given multiple medications for pain which helped her  symptoms initially, but then her pain started to recur. With these  symptoms persisting, it was felt best she come in for further  evaluation.   Currently, she is feeling a little better. She is complaining of a mild  headache but no vision changes. No dysphagia. No chest pain,  palpitations, shortness of breath. No wheezing. She did complain of some  lower quadrant abdominal pain but no hematuria, dysuria or constipation.  She continues to have diarrhea. No focal extremity numbness, weakness or  pain. Review of systems is otherwise essentially negative.   The patient's past medical history includes:  1. Hypertension.  2. Diverticulosis.   MEDICATIONS:  She is on atenolol and Topamax for migraines. She was just  started on Cipro and Flagyl recently.   SHE HAS ALLERGIES TO COMPAZINE.   SOCIAL HISTORY:  She denies any tobacco, heavy alcohol or heavy drug  use.   FAMILY HISTORY:  Noncontributory.  PHYSICAL EXAMINATION:  The patient's vital signs on admission:  Temperature 101.2, heart rate 103, blood pressure 110/69, respirations  20, O2 saturation 96% on room air.  In general, the patient is alert and oriented x3 in no apparent  distress.  HEENT:  Normocephalic, atraumatic. Mucous membranes are moist. She has  no carotid bruits.  HEART:  Regular rate and rhythm. S1/S2.  LUNGS:  Clear to auscultation bilaterally.  ABDOMEN:  Soft, slightly distended, nontender.  EXTREMITIES:  Showed no clubbing, cyanosis, or edema. Good 2+ pulses.   LABORATORY DATA:  White count 10.1, H&H 11.9 and 35, MCV 93, platelet  count 223, 93% neutrophils. UA:  Only notes 40 of ketones. Sodium 135,  potassium 6.4, chloride 101, bicarb 27, BUN 6, creatinine 0.9, glucose  120.   ASSESSMENT AND PLAN:  1. Diarrhea. Considering the nature of the symptoms and the fact that      she has a little bit of a white count with a differential and a       temperature, I suspect that she has a gastroenteritis, viral versus      bacterial versus food poisoning. She has not been on any      antibiotics, and with her white count being close to normal, I do      not think she has clostridium difficile. Will send stool studies      for fecal white blood cells, ova and parasites. If these return      back positive, then we will treat according. In the meantime, we      will treat this more like viral gastritis with supportive measures      and pain and nausea control.  2. Abdominal pain. Again, suspect a viral gastritis.  3. Hypertension. Stable. Currently, the patient is slightly      hypotensive. Will treat with IV fluids.      Hollice Espy, M.D.  Electronically Signed     SKK/MEDQ  D:  09/09/2006  T:  09/09/2006  Job:  [id number]   cc:   Tinnie Gens A. Tracey Cooler, MD

## 2010-06-24 NOTE — Assessment & Plan Note (Signed)
[facility]                                 ON-CALL NOTE   [name], [name]                          MRN:          [id number]  DATE:09/08/2006                            DOB:          1950/07/25    TIME:  9:30 p.m.   PHONE NUMBER:  [phone].   OBJECTIVE:  The patient has diarrhea.  Was seen in the hospital, had a  CT scan of the lower abdomen done, evidently they called the hospital  doctor on call, he told them to cover her with Cipro and Flagyl.  I am  not sure what the CT scan report was.  She is now having diarrhea, and  wants to know if she can take Imodium.   ASSESSMENT:  Diarrhea after CT scan.   PLAN:  She was given contrast for the CT scan.  She had to drink that  stuff.  Would have her hold taking any medicine for diarrhea and allow  the diarrhea to progress, at least at this point.  If she either becomes  rectally uncomfortable or diarrhea continues throughout the night and  she becomes lightheaded or dizzy or feels dehydrated, would then  possibly take one dose of Imodium, but would otherwise try and hold off.  If she is still having diarrhea tomorrow, would call the office and try  to be seen.  She is to call anyway to learn the CT scan results.  Presume that she has diverticulitis and is being treated with Cipro and  Flagyl.   PRIMARY CARE Jasiel [facility]:  Dr. Tawanna Cooler.   HOME OFFICE:  [facility].     Arta Silence, MD  Electronically Signed    RNS/MedQ  DD: 09/08/2006  DT: 09/09/2006  Job #: [id number]

## 2010-06-27 NOTE — Assessment & Plan Note (Signed)
[facility]                                   ON-CALL NOTE   [name], [name]                        MRN:          [id number]  DATE:10/03/2005                            DOB:          12/17/1950    TIME CALLED:  9:45 a.m.   CHIEF COMPLAINT:  Diarrhea.   HISTORY:  I spoke with the patient yesterday. She is on Cipro and Flagyl for  diverticulitis and had a dose of milk of magnesia and started to have  diarrhea yesterday.  She said now her diarrhea is severe.  She cannot take a  sip of fluid without running to the bathroom and having very watery bowel  movement.  At this point she is also having abdominal pain again which was  improved yesterday.  I told her given the fact that she has diverticulitis  and is on antibiotics, I am not sure if the diarrhea is from the milk of  magnesia, the antibiotics or the diverticulitis.  And given the fact that  her abdominal pain is coming back, I really feel that she needs to be seen  today and I advised her to go to the emergency room for further evaluation  now.                                   [name], MD   MAT/MedQ  DD:  10/04/2005  DT:  10/05/2005  Job #:  [id number]   cc:   Tinnie Gens A. Tawanna Cooler, MD

## 2010-06-27 NOTE — Assessment & Plan Note (Signed)
[facility]                                   ON-CALL NOTE   [name], [name]                        MRN:          [id number]  DATE:10/03/2005                            DOB:          05-23-1950    TIME CALLED:  8:24 p.m.   TELEPHONE NUMBER:  [phone]   CHIEF COMPLAINT:  Diarrhea.   HISTORY:  The patient said she saw Dr. Tawanna Cooler on Thursday with diagnosis of  diverticulitis, is on clear liquids, Cipro and Flagyl.  She was also told to  take two tablespoons of milk of magnesia b.i.d.  She said that overall her  pain is better.  She is not having fever but the diarrhea is much worse.  She took one dose of milk of magnesia at 12 o'clock and has been to the  bathroom 10 times since.  She says that her stools are watery without much  cramping.  She wants to know if this is from the milk of magnesia and if she  should stop it and what she should do if it does not improved.  I told her  to take sips of clear fluid through the night to try to keep from getting  dehydrated and watch the diarrhea and to not take anymore milk of magnesia  until further notice.  Then if her pain returns or her symptoms worsen in  any way to call back or go to the emergency room and if she develops fever,  also to contact us.  If her diarrhea does not improve in the next day, she  will also need followup with Dr. Tawanna Cooler in the office.                                   [name], MD   MAT/MedQ  DD:  10/04/2005  DT:  10/05/2005  Job #:  [id number]   cc:   Tinnie Gens A. Tawanna Cooler, MD

## 2010-06-27 NOTE — Assessment & Plan Note (Signed)
[facility]                                 ON-CALL NOTE   [name], [name]                          MRN:          [id number]  DATE:05/28/2006                            DOB:          [birth year]    PRIMARY:  Dr. Tawanna Cooler.   PHONE NUMBER:  [phone].   SUBJECTIVE:  A 60 year old female who is currently in [place] on  vacation.  She noticed, on her right forehead, near her temple, near her  hairline, enlarged vessels over the past few days.  She says that they  are mildly tender, about 4/10 on a pain scale.  She has no other  symptoms, such as vision change, headache, nausea, vomiting, or ill  feelings.  She does have a sensation that, when she moves her jaw, there  is a pulling sensation in her forehead.   ASSESSMENT AND PLAN:  Recommended if symptoms worsen, seeing a physician  before returning home from [place] at an urgent care.  If her symptoms  do continue, follow up with Dr. Tawanna Cooler early next week.     Kerby Nora, MD  Electronically Signed    AB/MedQ  DD: 05/28/2006  DT: 05/29/2006  Job #: [id number]

## 2010-06-27 NOTE — Consult Note (Signed)
NAMEALYCEN, [name] NO.:  [id number]   MEDICAL RECORD NO.:  [id number]          PATIENT TYPE:  INP   LOCATION:  6734                         FACILITY:  [facility]   PHYSICIAN:  Georgiana Spinner, M.D.    DATE OF BIRTH:  27-Mar-1950   DATE OF CONSULTATION:  DATE OF DISCHARGE:  09/12/2006                                 CONSULTATION   Ms. Kocak called this morning, stating that she had been up all night  with nausea, vomiting and diarrhea.  Diarrhea was watery.  There was no  abdominal pain, no fever, but there was feeling of chills.  She states  that this started abruptly last night after going to bed and she was  concerned because about four weeks ago she was hospitalized for 3 to 4  days with salmonella enteritis.  She was given Flagyl and Cipro and  responded to that and was concerned about these symptoms.  She was  concerned about also dehydration and the fact that she had her high  school reunion to attend later this evening.  At this point, I told her  that it sounded more like a viral illness or possibly it could be an  early C. Difficile colitis infection secondary to the antibiotics that  she had gotten.  Just a few weeks ago, I suggested she try drinking  fluids and getting Imodium and if her symptoms persisted she could be  seen at an Urgent Care Center or come to the emergency room for further  evaluation, but told her that if she were in fact dehydrated, she would  probably need fluids and would probably have to stay in the hospital for  some time.  She told me she would see how things went over the next hour  and if her diarrhea persisted she would come to the emergency room.           ______________________________  Georgiana Spinner, M.D.     GMO/MEDQ  D:  10/16/2006  T:  10/16/2006  Job:  [id number]   cc:   Hedwig Morton. Juanda Chance, MD  [address]  [place]  [place] [address]

## 2010-06-27 NOTE — Assessment & Plan Note (Signed)
[facility]                           GASTROENTEROLOGY OFFICE NOTE   [name], [name]                        MRN:          [id number]  DATE:11/23/2005                            DOB:          1950/09/09    [name] is a very nice 60 year old female who has had upper abdominal  pain.  Her current GI problems started acutely on September 29, 2005 when she  took Trimox antibiotic before dental procedure.  She took 3 pills and, 2  days later, developed back pain, for which she took Naprosyn 2 tablets x2  doses.  She then woke up in the middle of the night with severe lower  abdominal pain and severe diarrhea, which lasted several days.  She saw Dr.  Tawanna Rivera and was thought to have diverticulitis, and put on Cipro and Flagyl.  Her diarrhea persisted, as well as abdominal pain, and she was evaluated on  October 05, 2005 in the emergency room, and again continued on Flagyl and  Cipro for presumed diverticulitis.  CT scan of the abdomen and pelvis on  September 17 showed a presence of diverticula but no inflammatory changes in  her colon.  She got better, but suddenly started having epigastric pain.  This morning, she had severe pain in the epigastrium, but no nausea or  vomiting.  Her diarrhea has resolved.  She has had some mild constipation.   MEDICATIONS:  Atenolol.  Calcium supplements.  Vivelle.   PAST HISTORY:  Significant for heart rhythm problems, chronic headaches.   OPERATIONS:  Hysterectomy, tubal ligation, and breast surgery.  She also has  a history of mitral valve prolapse.   FAMILY HISTORY:  Father has heart disease.   SOCIAL HISTORY:  Married, 2 children.  She works in the lower school in the  office.  She does not smoke.  Does not drink alcohol.   REVIEW OF SYSTEMS:  Positive for back pain.   PHYSICAL EXAMINATION:  Blood pressure 106/64, pulse 60, weight 115 pounds.  She was alert and oriented, in no distress.  NECK:  Supple.  No  adenopathy.  LUNGS:  Clear to auscultation bilaterally.  COR:  Normal S1, normal S2.  ABDOMEN:  Soft with normoactive bowel sounds.  There was tenderness in the  epigastrium and above the umbilicus in the midline.  Liver edge at costal  margin.  Lower abdomen at this time was unremarkable.  RECTAL:  Soft, Hemoccult negative stool.   IMPRESSION:  A 60 year old female with acute epigastric pain following  treatment for diverticulitis.  A CT scan of the abdomen recently did not  show any inflammatory changes or any mass.  I think we are dealing with  gastritis related to the use of Naprosyn.  Possibly, we may be also dealing  with post-infectious irritable bowel syndrome.  I did a colonoscopy on her  in March 2004, which showed diverticulosis of the left colon.   PLAN:  1. Samples of Protonix 40 mg daily.  2. Prescription for Robinul 2 mg take 1/2 to 1 tablet up to twice a day  p.r.n. crampy abdominal pain.  3. Soft diet for the next 3 days, then advance to high-fiber diet.  If the      symptoms continue I would consider upper endoscopy.       [name]. Tracey Chance, MD      DMB/MedQ  DD:  11/23/2005  DT:  11/24/2005  Job #:  [id number]   cc:   Tracey Gens A. Tracey Cooler, MD

## 2010-07-09 ENCOUNTER — Other Ambulatory Visit: Payer: Self-pay | Admitting: Dermatology

## 2010-07-09 ENCOUNTER — Ambulatory Visit (INDEPENDENT_AMBULATORY_CARE_PROVIDER_SITE_OTHER): Payer: No Typology Code available for payment source | Admitting: Family Medicine

## 2010-07-09 ENCOUNTER — Encounter: Payer: Self-pay | Admitting: Family Medicine

## 2010-07-09 DIAGNOSIS — K625 Hemorrhage of anus and rectum: Secondary | ICD-10-CM

## 2010-07-09 DIAGNOSIS — R0789 Other chest pain: Secondary | ICD-10-CM

## 2010-07-09 DIAGNOSIS — R079 Chest pain, unspecified: Secondary | ICD-10-CM

## 2010-07-09 MED ORDER — HYDROCORTISONE ACETATE 25 MG RE SUPP: RECTAL | Status: AC

## 2010-07-09 NOTE — Progress Notes (Signed)
  Subjective:    Patient ID: [name], female    DOB: [birth year], 60 y.o.   MRN: [id number]  HPI Darel Hong is a 60 year old, married female, nonsmoker, who comes in today for evaluation of two problems.  She states today she began having chest pain.  She describes it as sometimes dull, sometimes sharp.  She points to the left lower sternal area as the source for pain.  It's been constant for the last 3 hours and, now it's gone.  She has no cardiac or pulmonary symptoms.  She does have a history of reflux esophagitis.  She also noticed some bright red blood the bleeding.  This morning.  It was painless.  Colonoscopy 5 years ago, normal   Review of Systems    General GI and cardiovascular review of systems negative Objective:   Physical Exam    Well-developed and nourished, female in no acute distress.  Cardiopulmonary exam negative.  Abdominal exam negative.  Rectum shows a rectal fissure at the 6 o'clock position, however, it's painless.  Also, internally I can palpate no abnormal lesions.  Stool was brown but guaiac-positive    Assessment & Plan:  Atypical chest pain reassured.  Bright red rectal bleeding.  Plan stool softener warm soaks daily suppository daily at bedtime for two weeks.  Follow-up in 3 weeks

## 2010-07-09 NOTE — Patient Instructions (Signed)
Warm soaks nightly, and insert one of the medicated suppositories in your rectum nightly for the next two weeks.  Follow-up with me in 3 weeks.  Take Prilosec OTC one tab daily in the morning for 6 weeks for the reflux esophagitis

## 2010-07-10 ENCOUNTER — Ambulatory Visit: Payer: No Typology Code available for payment source | Admitting: Physical Therapy

## 2010-07-30 ENCOUNTER — Ambulatory Visit: Payer: No Typology Code available for payment source | Attending: Orthopedic Surgery | Admitting: Physical Therapy

## 2010-07-30 ENCOUNTER — Ambulatory Visit (INDEPENDENT_AMBULATORY_CARE_PROVIDER_SITE_OTHER): Payer: No Typology Code available for payment source | Admitting: Family Medicine

## 2010-07-30 DIAGNOSIS — M542 Cervicalgia: Secondary | ICD-10-CM | POA: Insufficient documentation

## 2010-07-30 DIAGNOSIS — X58XXXA Exposure to other specified factors, initial encounter: Secondary | ICD-10-CM | POA: Insufficient documentation

## 2010-07-30 DIAGNOSIS — K5900 Constipation, unspecified: Secondary | ICD-10-CM

## 2010-07-30 DIAGNOSIS — K625 Hemorrhage of anus and rectum: Secondary | ICD-10-CM

## 2010-07-30 DIAGNOSIS — K644 Residual hemorrhoidal skin tags: Secondary | ICD-10-CM

## 2010-07-30 DIAGNOSIS — M2569 Stiffness of other specified joint, not elsewhere classified: Secondary | ICD-10-CM | POA: Insufficient documentation

## 2010-07-30 DIAGNOSIS — IMO0001 Reserved for inherently not codable concepts without codable children: Secondary | ICD-10-CM | POA: Insufficient documentation

## 2010-07-30 MED ORDER — HYDROCORTISONE 2.5 % RE CREA: TOPICAL_CREAM | RECTAL | Status: DC

## 2010-07-30 NOTE — Progress Notes (Signed)
  Subjective:    Patient ID: [name], female    DOB: [birth year], 60 y.o.   MRN: [id number]  HPIShe is a delightful, 60 year old female, married, nonsmoker, who comes in today for evaluation of multiple issues.  We saw a couple weeks ago, which had a flareup in her IBS constipation type and developed some rectal bleeding.  We put her on a bowel program comes in now soft.  Bleeding has normal stopped.  She occasionally has some bright red bleeding.  She's been using the warm soaks in the Anusol suppositories.  She's also has some soreness.  The basilar scar where she fell a couple months ago.  She went to emerge from O. Exam CT scan, etc., were normal.  She also has a spot on the back of her leg.  She would like checked.  Recent history of melanoma    Review of Systems General in GI review of systems otherwise negative    Objective:   Physical Exam    Well-developed well-nourished, female, in no acute distress.  Examination and was normal.  Examination rectum shows the hemorrhoid is mostly healed.  Also the small rectal fissure is about 95% healed.  Also.  Spot on her leg appears to be a seborrheic keratosis.  Skull exam normal.    Assessment & Plan:  Rectal bleeding internal hemorrhoid and rectal fissure, resolving slowly.  Plan continue diet, stool softener, warm soaks with medicated cream.  GI follow-up in two to 3 weeks.  If bleeding persists

## 2010-07-30 NOTE — Patient Instructions (Signed)
Continue the stool softener so your having two to 3 loose bowel movements daily.  Warm soaks at bedtime and applied the medicated ointment to the rectum.  Do this for the next two to 3 weeks and the bleeding should completely stopped.  If not, then I would recommend that you see your gastroenterologist for further treatment.

## 2010-08-01 ENCOUNTER — Ambulatory Visit: Payer: No Typology Code available for payment source | Admitting: Physical Therapy

## 2010-08-20 ENCOUNTER — Ambulatory Visit: Payer: BC Managed Care – PPO | Attending: Orthopedic Surgery | Admitting: Physical Therapy

## 2010-08-20 DIAGNOSIS — IMO0001 Reserved for inherently not codable concepts without codable children: Secondary | ICD-10-CM | POA: Insufficient documentation

## 2010-08-20 DIAGNOSIS — X58XXXA Exposure to other specified factors, initial encounter: Secondary | ICD-10-CM | POA: Insufficient documentation

## 2010-08-20 DIAGNOSIS — M542 Cervicalgia: Secondary | ICD-10-CM | POA: Insufficient documentation

## 2010-08-20 DIAGNOSIS — M2569 Stiffness of other specified joint, not elsewhere classified: Secondary | ICD-10-CM | POA: Insufficient documentation

## 2010-09-22 ENCOUNTER — Other Ambulatory Visit: Payer: Self-pay | Admitting: Family Medicine

## 2010-09-23 ENCOUNTER — Other Ambulatory Visit: Payer: Self-pay | Admitting: *Deleted

## 2010-09-23 NOTE — Telephone Encounter (Signed)
Pt is requesting a refill of Fiorinal is this okay to fill?

## 2010-09-23 NOTE — Telephone Encounter (Signed)
rx called in

## 2010-09-23 NOTE — Telephone Encounter (Signed)
[facility] please call to my knowledge, they no longer make this medication

## 2010-10-07 ENCOUNTER — Ambulatory Visit: Payer: BC Managed Care – PPO | Attending: Orthopedic Surgery | Admitting: Physical Therapy

## 2010-10-07 DIAGNOSIS — M2569 Stiffness of other specified joint, not elsewhere classified: Secondary | ICD-10-CM | POA: Insufficient documentation

## 2010-10-07 DIAGNOSIS — X58XXXA Exposure to other specified factors, initial encounter: Secondary | ICD-10-CM | POA: Insufficient documentation

## 2010-10-07 DIAGNOSIS — M542 Cervicalgia: Secondary | ICD-10-CM | POA: Insufficient documentation

## 2010-10-07 DIAGNOSIS — IMO0001 Reserved for inherently not codable concepts without codable children: Secondary | ICD-10-CM | POA: Insufficient documentation

## 2010-10-22 ENCOUNTER — Ambulatory Visit: Payer: BC Managed Care – PPO | Attending: Orthopedic Surgery | Admitting: Physical Therapy

## 2010-10-22 DIAGNOSIS — M542 Cervicalgia: Secondary | ICD-10-CM | POA: Insufficient documentation

## 2010-10-22 DIAGNOSIS — IMO0001 Reserved for inherently not codable concepts without codable children: Secondary | ICD-10-CM | POA: Insufficient documentation

## 2010-10-22 DIAGNOSIS — X58XXXA Exposure to other specified factors, initial encounter: Secondary | ICD-10-CM | POA: Insufficient documentation

## 2010-10-22 DIAGNOSIS — M2569 Stiffness of other specified joint, not elsewhere classified: Secondary | ICD-10-CM | POA: Insufficient documentation

## 2010-10-29 ENCOUNTER — Other Ambulatory Visit: Payer: Self-pay | Admitting: Dermatology

## 2010-11-24 LAB — I-STAT 8, (EC8 V) (CONVERTED LAB)
BUN: 6
Chloride: 101
Glucose, Bld: 120 — ABNORMAL HIGH
Hemoglobin: 12.2
Potassium: 3.4 — ABNORMAL LOW
Sodium: 135
pH, Ven: 7.44 — ABNORMAL HIGH

## 2010-11-24 LAB — CBC
HCT: 35.1 — ABNORMAL LOW
Hemoglobin: 10.9 — ABNORMAL LOW
Hemoglobin: 9.9 — ABNORMAL LOW
Hemoglobin: 11.9 — ABNORMAL LOW
MCV: 93.9
Platelets: 179
Platelets: 223
RBC: 3.44 — ABNORMAL LOW
RBC: 3.74 — ABNORMAL LOW
RDW: 12.3
RDW: 12.6
WBC: 10.1

## 2010-11-24 LAB — FECAL LACTOFERRIN, QUANT: Fecal Lactoferrin: POSITIVE

## 2010-11-24 LAB — CLOSTRIDIUM DIFFICILE EIA: C difficile Toxins A+B, EIA: NEGATIVE

## 2010-11-24 LAB — DIFFERENTIAL
Basophils Absolute: 0
Eosinophils Absolute: 0
Eosinophils Relative: 0
Lymphocytes Relative: 31
Lymphs Abs: 0.4 — ABNORMAL LOW
Monocytes Absolute: 0.3
Monocytes Relative: 3
Neutro Abs: 3.2
Neutrophils Relative %: 53

## 2010-11-24 LAB — URINALYSIS, ROUTINE W REFLEX MICROSCOPIC
Glucose, UA: NEGATIVE
Hgb urine dipstick: NEGATIVE
Protein, ur: NEGATIVE
pH: 5

## 2010-11-24 LAB — GIARDIA/CRYPTOSPORIDIUM SCREEN(EIA)
Giardia Screen - EIA: NEGATIVE
Giardia Screen - EIA: NEGATIVE
Giardia Screen - EIA: NEGATIVE
Giardia Screen - EIA: NEGATIVE

## 2010-11-24 LAB — OCCULT BLOOD X 1 CARD TO LAB, STOOL
Fecal Occult Bld: NEGATIVE
Fecal Occult Bld: NEGATIVE

## 2010-12-01 ENCOUNTER — Ambulatory Visit (INDEPENDENT_AMBULATORY_CARE_PROVIDER_SITE_OTHER): Payer: No Typology Code available for payment source | Admitting: Internal Medicine

## 2010-12-01 ENCOUNTER — Encounter: Payer: Self-pay | Admitting: Internal Medicine

## 2010-12-01 VITALS — BP 100/60 | HR 64 | Ht 61.5 in | Wt 117.0 lb

## 2010-12-01 DIAGNOSIS — K602 Anal fissure, unspecified: Secondary | ICD-10-CM

## 2010-12-01 DIAGNOSIS — K5900 Constipation, unspecified: Secondary | ICD-10-CM

## 2010-12-01 NOTE — Progress Notes (Signed)
[name] August 24, 1950 MRN [id number]    History of Present Illness:  This is a 60 year old white female with irritable bowel syndrome and predominant constipation who has noticed intermittent low-volume rectal bleeding. She was diagnosed with an anal fissure by Dr. Tawanna Cooler in June 2012. She has used topical cortisone cream. She still sees some blood occasionally. She remains constipated, taking Colace 50 mg daily. She just started on a high-fiber diet. There is a history of melanoma in November 2011. Her last colonoscopy in March 2004 showed mild diverticulosis but no polyps. A CT scan of the abdomen in June 2012 was negative for active disease. She has occasional burning in the epigastrium for which she takes Prilosec 20 mg daily.   Past Medical History  Diagnosis Date  . MVP (mitral valve prolapse) 1990    psa  . Rosacea   . LBP (low back pain)   . Hypokalemia   . Migraine   . Anxiety   . Diverticulosis   . DDD (degenerative disc disease)    Past Surgical History  Procedure Date  . Abdominal hysterectomy 1985  . Repair knee ligament 1989    left  . Tubal ligation   . Tonsillectomy 1955  . Vein surgery   . Breast surgery     left breast bx: secondary to infection  . Salpingoophorectomy 1994    left  . Breast biopsy 2003    left    reports that she has never smoked. She has never used smokeless tobacco. She reports that she drinks alcohol. She reports that she does not use illicit drugs. family history includes Colitis in her father; Colon polyps in her father and mother; and Heart disease in her father and mother. Allergies  Allergen Reactions  . Codeine   . Compazine   . Prednisone     REACTION: makes heart beat hard \\T \ fast  . Sulfonamide Derivatives         Review of Systems: Denies dysphagia chest pain odynophagia or shortness of breath  The remainder of the 10 point ROS is negative except as outlined in H&P   Physical Exam: General appearance  Well  developed, in no distress. Eyes- non icteric. HEENT nontraumatic, normocephalic. Mouth no lesions, tongue papillated, no cheilosis. Neck supple without adenopathy, thyroid not enlarged, no carotid bruits, no JVD. Lungs Clear to auscultation bilaterally. Cor normal S1, normal S2, regular rhythm, no murmur,  quiet precordium. Abdomen: Soft, nontender with normal active bowel sounds. Rectal and anoscopic exam: reveals normal perianal area. Normal rectal sphincter tone. Healed anal fissure. At 3:00, palpated digitally was a hard nodular tissue. Direct visualization shows mild indentation and healed up scar tissue. There is no bleeding from the fissure. There are no significant hemorrhoids except a small hemorrhoid at 9:00 within the anal canal which does not seem to be active. Stool is Hemoccult negative. Skin no lesions. Neurological alert and oriented x 3. Psychological normal mood and affect.  Assessment and Plan:  Problem #1 Healed anal fissure is likely source of intermittent bleeding as a result of straining and breaking the scar tissue. She does not have any pain from the fissure and generally has been improved using the topical steroids. We have discussed treating her constipation. She will double up on the stool softeners and start Benefiber 1 teaspoon daily. She will also continue on a high-fiber diet. I would not recommend surgical treatment of the fissure at this time. She will be due for a colonoscopy in March 2014  or sooner if she develops specific problems.   12/01/2010 Lina Sar

## 2010-12-01 NOTE — Patient Instructions (Signed)
You will be due for a recall colonoscopy in 04/2012. We will send you a reminder in the mail when it gets closer to that time. CC:Dr Tawanna Cooler

## 2010-12-05 ENCOUNTER — Ambulatory Visit (INDEPENDENT_AMBULATORY_CARE_PROVIDER_SITE_OTHER): Payer: No Typology Code available for payment source | Admitting: Family Medicine

## 2010-12-05 ENCOUNTER — Other Ambulatory Visit: Payer: No Typology Code available for payment source

## 2010-12-05 ENCOUNTER — Encounter: Payer: Self-pay | Admitting: Family Medicine

## 2010-12-05 ENCOUNTER — Telehealth: Payer: Self-pay | Admitting: Family Medicine

## 2010-12-05 VITALS — BP 108/78 | HR 58 | Temp 97.6°F | Wt 117.0 lb

## 2010-12-05 DIAGNOSIS — D649 Anemia, unspecified: Secondary | ICD-10-CM

## 2010-12-05 DIAGNOSIS — S239XXA Sprain of unspecified parts of thorax, initial encounter: Secondary | ICD-10-CM

## 2010-12-05 DIAGNOSIS — R42 Dizziness and giddiness: Secondary | ICD-10-CM

## 2010-12-05 DIAGNOSIS — IMO0002 Reserved for concepts with insufficient information to code with codable children: Secondary | ICD-10-CM

## 2010-12-05 DIAGNOSIS — Z0000 Encounter for general adult medical examination without abnormal findings: Secondary | ICD-10-CM

## 2010-12-05 LAB — CBC WITH DIFFERENTIAL/PLATELET
Basophils Absolute: 0 10*3/uL (ref 0.0–0.1)
Basophils Relative: 0.5 % (ref 0.0–3.0)
HCT: 40.3 % (ref 36.0–46.0)
Hemoglobin: 13.5 g/dL (ref 12.0–15.0)
Lymphocytes Relative: 19.3 % (ref 12.0–46.0)
Lymphs Abs: 1.7 10*3/uL (ref 0.7–4.0)
Monocytes Relative: 5.2 % (ref 3.0–12.0)
Neutro Abs: 6.4 10*3/uL (ref 1.4–7.7)
RBC: 4.22 Mil/uL (ref 3.87–5.11)
RDW: 13.3 % (ref 11.5–14.6)

## 2010-12-05 LAB — VITAMIN B12: Vitamin B-12: 1189 pg/mL — ABNORMAL HIGH (ref 211–911)

## 2010-12-05 LAB — BASIC METABOLIC PANEL
CO2: 30 mEq/L (ref 19–32)
Calcium: 9.7 mg/dL (ref 8.4–10.5)
GFR: 90.05 mL/min (ref >60.00)
Glucose, Bld: 97 mg/dL (ref 70–99)
Potassium: 4.1 mEq/L (ref 3.5–5.1)
Sodium: 142 mEq/L (ref 135–145)

## 2010-12-05 LAB — TSH: TSH: 0.73 u[IU]/mL (ref 0.35–5.50)

## 2010-12-05 LAB — POCT URINALYSIS DIPSTICK
Bilirubin, UA: NEGATIVE
Glucose, UA: NEGATIVE
Ketones, UA: NEGATIVE
Leukocytes, UA: NEGATIVE
Nitrite, UA: NEGATIVE
pH, UA: 7

## 2010-12-05 LAB — HEPATIC FUNCTION PANEL
ALT: 13 U/L (ref 0–35)
AST: 16 U/L (ref 0–37)
Albumin: 4.6 g/dL (ref 3.5–5.2)
Alkaline Phosphatase: 60 U/L (ref 39–117)
Total Protein: 7.9 g/dL (ref 6.0–8.3)

## 2010-12-05 LAB — LIPID PANEL: Total CHOL/HDL Ratio: 3

## 2010-12-05 LAB — FERRITIN: Ferritin: 41.7 ng/mL (ref 10.0–291.0)

## 2010-12-05 MED ORDER — KETOROLAC TROMETHAMINE 60 MG/2ML IM SOLN
60.0000 mg | Freq: Once | INTRAMUSCULAR | Status: AC
  Administered 2010-12-05: 60 mg via INTRAMUSCULAR

## 2010-12-05 MED ORDER — CYCLOBENZAPRINE HCL 10 MG PO TABS: 10.0000 mg | ORAL_TABLET | Freq: Three times a day (TID) | ORAL | Status: AC | PRN

## 2010-12-05 NOTE — Telephone Encounter (Signed)
Spoke with pt and it is okay.

## 2010-12-05 NOTE — Telephone Encounter (Signed)
Pt was in and rcvd a toradol inj by Dr Clent Ridges and the doctor mentioned pt needed to get a neck massage. Pt wants to know if Dr Clent Ridges really wanted pt to get one, or not?

## 2010-12-05 NOTE — Progress Notes (Signed)
  Subjective:    Patient ID: [name], female    DOB: [birth year], 60 y.o.   MRN: [id number]  HPI Here for 3 things. First she has been dizzy off and on for about a week. This is true vertigo with the room spinning when she moves suddenly. Meclizine helps. No HA or tinnitis. No nausea. Also she has had 3 days of a sharp pain in the left upper back. She thinks this came from reaching up to work on a porch swing. Third she recently saw Dr. Juanda Chance for rectal bleeding, and she wants to check some labs for this.    Review of Systems  Constitutional: Negative.   HENT: Negative.   Respiratory: Negative.   Cardiovascular: Negative.   Musculoskeletal: Positive for back pain.  Neurological: Positive for dizziness.       Objective:   Physical Exam  Constitutional: She is oriented to person, place, and time. She appears well-developed and well-nourished.  HENT:  Head: Normocephalic and atraumatic.  Right Ear: External ear normal.  Left Ear: External ear normal.  Nose: Nose normal.  Mouth/Throat: Oropharynx is clear and moist.  Eyes: Conjunctivae are normal. Pupils are equal, round, and reactive to light.  Neck: Neck supple. No thyromegaly present.  Musculoskeletal:       Tender in the left upper back near the scapula  Lymphadenopathy:    She has no cervical adenopathy.  Neurological: She is alert and oriented to person, place, and time. No cranial nerve deficit. Coordination normal.          Assessment & Plan:  Given a Toradol shot for the back strain. Use Motrin at home. Add FLexeril prn. Labs ordered. Use Meclizine for the vertigo

## 2010-12-05 NOTE — Progress Notes (Signed)
Addended by: Aniceto Boss A on: 12/05/2010 12:02 PM   Modules accepted: Orders

## 2010-12-06 ENCOUNTER — Ambulatory Visit (INDEPENDENT_AMBULATORY_CARE_PROVIDER_SITE_OTHER): Payer: No Typology Code available for payment source | Admitting: Family Medicine

## 2010-12-06 ENCOUNTER — Encounter: Payer: Self-pay | Admitting: Family Medicine

## 2010-12-06 VITALS — BP 110/80 | HR 72 | Temp 97.5°F | Wt 118.8 lb

## 2010-12-06 DIAGNOSIS — M62838 Other muscle spasm: Secondary | ICD-10-CM

## 2010-12-06 MED ORDER — HYDROCODONE-ACETAMINOPHEN 5-325 MG PO TABS: 1.0000 | ORAL_TABLET | Freq: Four times a day (QID) | ORAL | Status: AC | PRN

## 2010-12-06 NOTE — Progress Notes (Signed)
Back pain.  Saw Dr. Abran Cantor yesterday. Flexeril isn't helping but she was a little drowsy.  Had toradol injection yesterday. Was working on a swing and that may be the trigger.  L sided upper back pain.  Massage didn't help.   Trouble sleeping from pain.  She had some left over hydrocodone, took if a few minutes before the eval today.  No fevers. No other trauma.  Using heat and ice.    Meds, vitals, and allergies reviewed.   ROS: See HPI.  Otherwise, noncontributory.  nad rrr ctab ttp with spasm medial to L scapula. Normal ROM L shoulder.

## 2010-12-06 NOTE — Patient Instructions (Signed)
Massage the area, use a heating pad, take flexeril and vicodin.  Sedation caution on both. Should gradually improve.

## 2010-12-06 NOTE — Assessment & Plan Note (Signed)
Flexeril and hydrocodone prn for pain, stretch and local heat. F/u prn with PMD.

## 2010-12-10 ENCOUNTER — Telehealth: Payer: Self-pay | Admitting: Family Medicine

## 2010-12-10 NOTE — Telephone Encounter (Signed)
Message copied by Baldemar Friday on Wed Dec 10, 2010  5:20 PM ------      Message from: Gershon Crane A      Created: Mon Dec 08, 2010  6:03 AM       All normal. She is not anemic

## 2010-12-10 NOTE — Telephone Encounter (Signed)
Left voice message with results.

## 2010-12-11 ENCOUNTER — Other Ambulatory Visit: Payer: Self-pay | Admitting: Dermatology

## 2010-12-11 ENCOUNTER — Encounter: Payer: No Typology Code available for payment source | Admitting: Family Medicine

## 2010-12-15 ENCOUNTER — Ambulatory Visit (INDEPENDENT_AMBULATORY_CARE_PROVIDER_SITE_OTHER): Payer: No Typology Code available for payment source | Admitting: Family Medicine

## 2010-12-15 ENCOUNTER — Encounter: Payer: Self-pay | Admitting: Family Medicine

## 2010-12-15 VITALS — BP 104/76 | Temp 97.4°F | Ht 61.5 in | Wt 116.0 lb

## 2010-12-15 DIAGNOSIS — Z820 Family history of epilepsy and other diseases of the nervous system: Secondary | ICD-10-CM

## 2010-12-15 DIAGNOSIS — I4949 Other premature depolarization: Secondary | ICD-10-CM

## 2010-12-15 MED ORDER — ATENOLOL 25 MG PO TABS: 12.5000 mg | ORAL_TABLET | Freq: Every day | ORAL | Status: DC

## 2010-12-15 MED ORDER — BUTALBITAL-ASA-CAFFEINE 50-325-40 MG PO CAPS: 1.0000 | ORAL_CAPSULE | ORAL | Status: DC | PRN

## 2010-12-15 NOTE — Progress Notes (Signed)
  Subjective:    Patient ID: [name], female    DOB: 16-Jul-1950, 60 y.o.   MRN: [id number]  [name] is a 60 year old, married female, nonsmoker, who comes in today for annual physical examination  She has a history of migraine headaches for which he takes 12.5 mg of Tenormin daily and Fiorinal p.r.n.  She is currently taking estrogen via GYN advised to taper.  She takes Prilosec OTC 20 mg daily for reflux.  Recently had an episode of back pain, which only turned out to be shingles.  She was given Valtrex by her dermatologist, which he is tapering.     Review of Systems  Constitutional: Negative.   HENT: Negative.   Eyes: Negative.   Respiratory: Negative.   Cardiovascular: Negative.   Gastrointestinal: Negative.   Genitourinary: Negative.   Musculoskeletal: Negative.   Neurological: Negative.   Hematological: Negative.   Psychiatric/Behavioral: Negative.        Objective:   Physical Exam  Constitutional: She appears well-developed and well-nourished.  HENT:  Head: Normocephalic and atraumatic.  Right Ear: External ear normal.  Left Ear: External ear normal.  Nose: Nose normal.  Mouth/Throat: Oropharynx is clear and moist.  Eyes: EOM are normal. Pupils are equal, round, and reactive to light.  Neck: Normal range of motion. Neck supple. No thyromegaly present.  Cardiovascular: Normal rate, regular rhythm, normal heart sounds and intact distal pulses.  Exam reveals no gallop and no friction rub.   No murmur heard. Pulmonary/Chest: Effort normal and breath sounds normal.  Abdominal: Soft. Bowel sounds are normal. She exhibits no distension and no mass. There is no tenderness. There is no rebound.  Genitourinary:       Bilateral breast exam shows scar from previous biopsy.  No palpable masses except very small BB sized diffuse fibrocystic changes  Musculoskeletal: Normal range of motion.  Lymphadenopathy:    She has no cervical adenopathy.  Neurological: She is  alert. She has normal reflexes. No cranial nerve deficit. She exhibits normal muscle tone. Coordination normal.  Skin: Skin is warm and dry.  Psychiatric: She has a normal mood and affect. Her behavior is normal. Judgment and thought content normal.          Assessment & Plan:  Healthy female.  Migraine headaches.  Continue current medication.  Reflux esophagitis.  Continue current medication.  Follow-up in one year sooner if any problems

## 2010-12-15 NOTE — Patient Instructions (Signed)
Continue your current medications.  Follow-up in one year, sooner if any problem

## 2010-12-16 ENCOUNTER — Telehealth: Payer: Self-pay | Admitting: Family Medicine

## 2010-12-16 DIAGNOSIS — I4949 Other premature depolarization: Secondary | ICD-10-CM

## 2010-12-16 MED ORDER — ATENOLOL 25 MG PO TABS: 25.0000 mg | ORAL_TABLET | Freq: Every day | ORAL | Status: DC

## 2010-12-16 NOTE — Telephone Encounter (Signed)
Patient is still having tingling and numbness down left hand and finger.  There is burring and increases at night.  It is better but not gone.  Should med's or change the prescription?  Patient will need a refill.

## 2010-12-16 NOTE — Telephone Encounter (Signed)
Changes made to atenolol sig and resent to [facility].

## 2010-12-16 NOTE — Telephone Encounter (Signed)
Wants to speak with [facility] Contras about her medication. Still having issues with her back. Thanks.

## 2010-12-18 MED ORDER — VALACYCLOVIR HCL 1 G PO TABS: 1000.0000 mg | ORAL_TABLET | Freq: Two times a day (BID) | ORAL | Status: DC

## 2010-12-18 NOTE — Telephone Encounter (Signed)
Per Dr. Tawanna Cooler, patient to take half tab twice day until next appointment.  Patient aware, rx sent, and appointment made.

## 2010-12-29 ENCOUNTER — Telehealth: Payer: Self-pay | Admitting: Family Medicine

## 2010-12-29 DIAGNOSIS — R05 Cough: Secondary | ICD-10-CM

## 2010-12-29 NOTE — Telephone Encounter (Signed)
Patient is calling -  1- in the morning she has coughed up bright red blood.  It is not coming from her nose and it only happens in the morning. Any suggestions? 2- she is still having pain with her left shoulder where her shingles outbreak was.  It is not burning but more of an ache. She does have an appointment coming up and she is still taking her Rx for shingles. 3- she is coming in on Wednesday afternoon with her husband.  Is it okay for her to receive her flu vaccine? Call back on her cell phone please.

## 2010-12-29 NOTE — Telephone Encounter (Signed)
Pt has shingles. Pt could taste blood in her mouth. Pt has concerns requesting rachel to return her call.

## 2010-12-29 NOTE — Telephone Encounter (Signed)
Rachel please call.......... Send her for a chest x-ray, because she's coughing up blood.  I need to sit down and talk to her about the options, and she still having symptoms from her shingles.  Located at the flu vaccine

## 2010-12-30 NOTE — Telephone Encounter (Signed)
Spoke with patient and x-ray ordered and patient coming in tomorrow for flu vaccine

## 2010-12-31 ENCOUNTER — Telehealth: Payer: Self-pay | Admitting: Family Medicine

## 2010-12-31 ENCOUNTER — Ambulatory Visit (INDEPENDENT_AMBULATORY_CARE_PROVIDER_SITE_OTHER)
Admission: RE | Admit: 2010-12-31 | Discharge: 2010-12-31 | Disposition: A | Discharge status: Home / Self Care | Payer: No Typology Code available for payment source | Source: Ambulatory Visit | Attending: Family Medicine | Admitting: Family Medicine

## 2010-12-31 ENCOUNTER — Ambulatory Visit (INDEPENDENT_AMBULATORY_CARE_PROVIDER_SITE_OTHER): Payer: No Typology Code available for payment source | Admitting: Family Medicine

## 2010-12-31 DIAGNOSIS — R05 Cough: Secondary | ICD-10-CM

## 2010-12-31 DIAGNOSIS — Z23 Encounter for immunization: Secondary | ICD-10-CM

## 2010-12-31 NOTE — Telephone Encounter (Signed)
Spoke with patient.

## 2010-12-31 NOTE — Telephone Encounter (Signed)
Pt is getting over shingles and has a slight runny nose and wanted to check to make sure it was ok to get her flu shot

## 2011-01-12 ENCOUNTER — Ambulatory Visit (INDEPENDENT_AMBULATORY_CARE_PROVIDER_SITE_OTHER): Payer: No Typology Code available for payment source | Admitting: Family Medicine

## 2011-01-12 ENCOUNTER — Encounter: Payer: Self-pay | Admitting: Family Medicine

## 2011-01-12 VITALS — BP 108/68

## 2011-01-12 DIAGNOSIS — B029 Zoster without complications: Secondary | ICD-10-CM

## 2011-01-12 MED ORDER — AMITRIPTYLINE HCL 25 MG PO TABS: 25.0000 mg | ORAL_TABLET | Freq: Every day | ORAL | Status: DC

## 2011-01-12 NOTE — Progress Notes (Signed)
  Subjective:    Patient ID: [name], female    DOB: [birth year], 60 y.o.   MRN: [id number]  HPI Darel Hong is a delightful, 60 year old, married female, nonsmoker, who comes in today to follow-up on zoster.  Her rash and pain has mostly resolved except she has an occasional sharp pain that occurs in her back and shoots down her left arm.  It's diminishing over time.  She tells takes Valtrex 1000 mg daily.     Review of Systems    General and dermatologic review of systems otherwise negative except for sleep dysfunction Objective:   Physical Exam Well-developed well-nourished, female, in no acute distress.  Examination.  Skin shows the shingles lesions completely healed.       Assessment & Plan:  Post herpetic neuralgia, mild.  Plan Elavil 25 nightly

## 2011-01-12 NOTE — Patient Instructions (Signed)
Stop the acyclovir.  Begin Elavil 25 mg at bedtime.  Return p.r.n.

## 2011-02-05 ENCOUNTER — Telehealth: Payer: Self-pay | Admitting: Family Medicine

## 2011-02-05 NOTE — Telephone Encounter (Signed)
Pt called and said that she has a place on lft side of temple, that blood vessel is protruding and needs to know if she needs to be concerned. Pt is req call from nurse.

## 2011-02-05 NOTE — Telephone Encounter (Signed)
Rachel please call......Marland Kitchen Date symptomatically with ice for the swelling, blood vessel, if it persists for a week.  Office visit next week

## 2011-02-05 NOTE — Telephone Encounter (Signed)
Left message on machine for patient

## 2011-03-10 DIAGNOSIS — C44310 Basal cell carcinoma of skin of unspecified parts of face: Secondary | ICD-10-CM | POA: Insufficient documentation

## 2011-04-20 ENCOUNTER — Other Ambulatory Visit: Payer: Self-pay | Admitting: Dermatology

## 2011-07-02 ENCOUNTER — Other Ambulatory Visit: Payer: Self-pay | Admitting: Family Medicine

## 2011-07-14 ENCOUNTER — Encounter: Payer: Self-pay | Admitting: Family Medicine

## 2011-07-14 ENCOUNTER — Ambulatory Visit (INDEPENDENT_AMBULATORY_CARE_PROVIDER_SITE_OTHER): Payer: BC Managed Care – PPO | Admitting: Family Medicine

## 2011-07-14 VITALS — BP 102/72 | Temp 97.7°F | Wt 115.0 lb

## 2011-07-14 DIAGNOSIS — K589 Irritable bowel syndrome without diarrhea: Secondary | ICD-10-CM

## 2011-07-14 DIAGNOSIS — C4490 Unspecified malignant neoplasm of skin, unspecified: Secondary | ICD-10-CM | POA: Insufficient documentation

## 2011-07-14 DIAGNOSIS — Z2911 Encounter for prophylactic immunotherapy for respiratory syncytial virus (RSV): Secondary | ICD-10-CM

## 2011-07-14 DIAGNOSIS — Z0000 Encounter for general adult medical examination without abnormal findings: Secondary | ICD-10-CM

## 2011-07-14 NOTE — Progress Notes (Signed)
  Subjective:    Patient ID: [name], female    DOB: 27-Jun-1950, 61 y.o.   MRN: [id number]  HPI [name] is a 61 -year-old married female nonsmoker who comes in today for evaluation of 2 problems  She has a history of IBS and recently has been having the alternating constipation and diarrhea times.  In the winter she had a basal cell removed and had Mohs surgery right forehead and would like a skin exam   Review of Systems    review of systems negative except for panic attacks related to an amusement park rides Objective:   Physical Exam Well-developed well-nourished female in acute distress total body skin exam shows no obvious abnormal appearing lesions scars from previous lesions removed are well-healed including the right for head  Abdominal exam negative       Assessment & Plan:  IBS reassured  Chronic sun damage with history of skin cancers,,,,,,,, SPF 50+ recheck skin every 3 months

## 2011-07-14 NOTE — Patient Instructions (Signed)
Remember to wear your sunscreens  Going forward I would recommend we check your skin every 3 months  Try avoiding lactose and gluten for a week to see if that does not help your abdominal discomfort

## 2011-10-28 ENCOUNTER — Other Ambulatory Visit: Payer: Self-pay | Admitting: Dermatology

## 2011-11-11 DIAGNOSIS — Z719 Counseling, unspecified: Secondary | ICD-10-CM | POA: Insufficient documentation

## 2011-12-01 ENCOUNTER — Telehealth: Payer: Self-pay | Admitting: Family Medicine

## 2011-12-01 NOTE — Telephone Encounter (Signed)
Caller: [name]/Patient; Patient Name: [name]; PCP: Kelle Darting [facility]); Best Callback Phone Number: [phone];   Patient states she developed redness, swelling, inflammation around cuticle of fingernail of right index finger. Onset 11/30/11. States area is warm and tender to touch. No drainage. Afebrile. Triage per Hand Non-Injury Protocol. No emergent sx identified. Care advice given per guidelines related to positive triage assessment for  "New signs of local infection." Patient advised warm soaks/compresses with Epsom salts, elevation. Patient advised Ibuprofen 600mg . q 6 hours as needed for pain, advised to take with food. RN recommeded that patient be seen within 24 hours, per Protocol. Patient states she is out of town until 12/07/11. Patient advised to be seen at local Urgent Care within 24 hours since she is out of town. Call back parameters reviewed. Patient verbalizes understanding.

## 2011-12-15 ENCOUNTER — Other Ambulatory Visit (INDEPENDENT_AMBULATORY_CARE_PROVIDER_SITE_OTHER): Payer: BC Managed Care – PPO

## 2011-12-15 DIAGNOSIS — Z0000 Encounter for general adult medical examination without abnormal findings: Secondary | ICD-10-CM

## 2011-12-15 LAB — CBC WITH DIFFERENTIAL/PLATELET
Basophils Absolute: 0.1 10*3/uL (ref 0.0–0.1)
Eosinophils Absolute: 0.1 10*3/uL (ref 0.0–0.7)
HCT: 39.9 % (ref 36.0–46.0)
Hemoglobin: 13.1 g/dL (ref 12.0–15.0)
Lymphs Abs: 2.2 10*3/uL (ref 0.7–4.0)
MCHC: 32.8 g/dL (ref 30.0–36.0)
Neutro Abs: 3.7 10*3/uL (ref 1.4–7.7)
Platelets: 276 10*3/uL (ref 150.0–400.0)
RDW: 13 % (ref 11.5–14.6)

## 2011-12-15 LAB — HEPATIC FUNCTION PANEL
Albumin: 4.1 g/dL (ref 3.5–5.2)
Total Bilirubin: 0.6 mg/dL (ref 0.3–1.2)

## 2011-12-15 LAB — BASIC METABOLIC PANEL
BUN: 13 mg/dL (ref 6–23)
CO2: 30 mEq/L (ref 19–32)
Calcium: 9.5 mg/dL (ref 8.4–10.5)
GFR: 97.77 mL/min (ref >60.00)
Glucose, Bld: 90 mg/dL (ref 70–99)
Sodium: 136 mEq/L (ref 135–145)

## 2011-12-15 LAB — POCT URINALYSIS DIPSTICK
Bilirubin, UA: NEGATIVE
Glucose, UA: NEGATIVE
Leukocytes, UA: NEGATIVE
Nitrite, UA: NEGATIVE
Urobilinogen, UA: 0.2

## 2011-12-15 LAB — TSH: TSH: 0.84 u[IU]/mL (ref 0.35–5.50)

## 2011-12-15 LAB — LIPID PANEL
Cholesterol: 167 mg/dL (ref 0–200)
Total CHOL/HDL Ratio: 3
Triglycerides: 125 mg/dL (ref 0.0–149.0)

## 2011-12-22 ENCOUNTER — Encounter: Payer: Self-pay | Admitting: Family Medicine

## 2011-12-22 ENCOUNTER — Ambulatory Visit (INDEPENDENT_AMBULATORY_CARE_PROVIDER_SITE_OTHER): Payer: BC Managed Care – PPO | Admitting: Family Medicine

## 2011-12-22 VITALS — BP 110/70 | Temp 98.2°F | Ht 61.25 in | Wt 116.0 lb

## 2011-12-22 DIAGNOSIS — J309 Allergic rhinitis, unspecified: Secondary | ICD-10-CM

## 2011-12-22 DIAGNOSIS — N61 Mastitis without abscess: Secondary | ICD-10-CM

## 2011-12-22 DIAGNOSIS — B029 Zoster without complications: Secondary | ICD-10-CM

## 2011-12-22 DIAGNOSIS — C4490 Unspecified malignant neoplasm of skin, unspecified: Secondary | ICD-10-CM

## 2011-12-22 DIAGNOSIS — K589 Irritable bowel syndrome without diarrhea: Secondary | ICD-10-CM

## 2011-12-22 DIAGNOSIS — G43909 Migraine, unspecified, not intractable, without status migrainosus: Secondary | ICD-10-CM

## 2011-12-22 DIAGNOSIS — N951 Menopausal and female climacteric states: Secondary | ICD-10-CM | POA: Insufficient documentation

## 2011-12-22 DIAGNOSIS — K5730 Diverticulosis of large intestine without perforation or abscess without bleeding: Secondary | ICD-10-CM

## 2011-12-22 DIAGNOSIS — Z23 Encounter for immunization: Secondary | ICD-10-CM

## 2011-12-22 DIAGNOSIS — Z8679 Personal history of other diseases of the circulatory system: Secondary | ICD-10-CM

## 2011-12-22 DIAGNOSIS — L578 Other skin changes due to chronic exposure to nonionizing radiation: Secondary | ICD-10-CM

## 2011-12-22 DIAGNOSIS — N959 Unspecified menopausal and perimenopausal disorder: Secondary | ICD-10-CM

## 2011-12-22 DIAGNOSIS — I4949 Other premature depolarization: Secondary | ICD-10-CM

## 2011-12-22 MED ORDER — OMEPRAZOLE 20 MG PO CPDR: 20.0000 mg | DELAYED_RELEASE_CAPSULE | Freq: Every day | ORAL | Status: DC

## 2011-12-22 MED ORDER — ESTROGENS CONJUGATED 0.3 MG PO TABS: ORAL_TABLET | ORAL | Status: DC

## 2011-12-22 MED ORDER — AMITRIPTYLINE HCL 25 MG PO TABS: 25.0000 mg | ORAL_TABLET | Freq: Every day | ORAL | Status: DC

## 2011-12-22 MED ORDER — CEPHALEXIN 500 MG PO CAPS: ORAL_CAPSULE | ORAL | Status: DC

## 2011-12-22 MED ORDER — ATENOLOL 25 MG PO TABS: 25.0000 mg | ORAL_TABLET | Freq: Every day | ORAL | Status: DC

## 2011-12-22 NOTE — Patient Instructions (Signed)
Return next week for removal of the lesion on your back that has been persistently itching  Stop the hormonal patches  Begin Premarin 0.3,,,,,,,,,, 1 tab Monday Wednesday Friday  Keflex 500 mg............ 2 tabs twice daily if the mastitis or the finger infection recurs  Another option would be to see Dr. Margaree Mackintosh the hand surgeon

## 2011-12-22 NOTE — Progress Notes (Signed)
  Subjective:    Patient ID: [name], female    DOB: 06-28-1950, 61 y.o.   MRN: [id number]  HPI Darel Hong is a delightful 61 year old married female nonsmoker who comes in today for general physical examination  She takes Elavil 25 mg each bedtime for sleep dysfunction  She takes Tenormin 25 mg daily because of palpitations she has underlying MVP  She continues to hormonal patches one twice weekly and I have convinced her to try to taper off the hormones. Because she's had a T AH and BSO we will start on low dose Premarin and taper over the next couple months.  She uses Prilosec daily for reflux  She's had recurrent mastitis of her breasts. Every time she and her husband have sex and he rubs her breasts she gets secondary mastitis.  She has recurrent infections on the index finger of her right hand at the cuticle     Review of Systems  Constitutional: Negative.   HENT: Negative.   Eyes: Negative.   Respiratory: Negative.   Cardiovascular: Negative.   Gastrointestinal: Negative.   Genitourinary: Negative.   Musculoskeletal: Negative.   Neurological: Negative.   Hematological: Negative.   Psychiatric/Behavioral: Negative.        Objective:   Physical Exam  Constitutional: She appears well-developed and well-nourished.  HENT:  Head: Normocephalic and atraumatic.  Right Ear: External ear normal.  Left Ear: External ear normal.  Nose: Nose normal.  Mouth/Throat: Oropharynx is clear and moist.  Eyes: EOM are normal. Pupils are equal, round, and reactive to light.  Neck: Normal range of motion. Neck supple. No thyromegaly present.  Cardiovascular: Normal rate, regular rhythm, normal heart sounds and intact distal pulses.  Exam reveals no gallop and no friction rub.   No murmur heard. Pulmonary/Chest: Effort normal and breath sounds normal.  Abdominal: Soft. Bowel sounds are normal. She exhibits no distension and no mass. There is no tenderness. There is no rebound.    Genitourinary: Vagina normal and uterus normal. Guaiac negative stool. No vaginal discharge found.  Musculoskeletal: Normal range of motion.  Lymphadenopathy:    She has no cervical adenopathy.  Neurological: She is alert. She has normal reflexes. No cranial nerve deficit. She exhibits normal muscle tone. Coordination normal.  Skin: Skin is warm and dry.       Total body skin exam normal she had a lesion removed mid back which was a basal cell another lesion above that that she says itches all the time advised to return for removal  Psychiatric: She has a normal mood and affect. Her behavior is normal. Judgment and thought content normal.          Assessment & Plan:  Healthy female  Postmenopausal begin tapering with low-dose Premarin  Sleep dysfunction continue Elavil  Palpitations continue atenolol 25 mg daily  Reflux esophagitis continue Prilosec  History of skin cancer with new lesion itching on her back return for removal  Recurrent mastitis unknown etiology Keflex when necessary  Recurrent paronychia right index finger and consult when necessary

## 2012-01-05 ENCOUNTER — Ambulatory Visit (INDEPENDENT_AMBULATORY_CARE_PROVIDER_SITE_OTHER): Payer: BC Managed Care – PPO | Admitting: Family Medicine

## 2012-01-05 DIAGNOSIS — D239 Other benign neoplasm of skin, unspecified: Secondary | ICD-10-CM

## 2012-01-05 DIAGNOSIS — L820 Inflamed seborrheic keratosis: Secondary | ICD-10-CM

## 2012-01-05 DIAGNOSIS — D229 Melanocytic nevi, unspecified: Secondary | ICD-10-CM

## 2012-01-05 NOTE — Progress Notes (Signed)
  Subjective:    Patient ID: [name], female    DOB: 28-May-1950, 61 y.o.   MRN: [id number]  HPI [name] is a 61 year old married female nonsmoker who comes in today for removal of a periodic lesion on her back  She's had a history of chronic sun exposure and multiple abnormal lesions including on occasion 1 melanoma.  She's had a. Lesion on her back now for a couple weeks  After informed consent she was taken the treatment room the lesion was cleaned with alcohol and anesthetized with 1% Xylocaine with epinephrine. It was removed with 2 mm margins the base was cauterized Band-Aids was applied. The specimen was spent for pathologic analysis clinically appears to be it inflamed seborrheic keratosis Path pending   Review of Systems Dermatologic review of systems pertinent she's had a history of skin cancer    Objective:   Physical Exam  Procedure see above      Assessment & Plan:  Clinically appears to be a inflamed seborrheic keratosis Path pending

## 2012-01-05 NOTE — Patient Instructions (Signed)
Within 2 weeks we will call you the report 

## 2012-01-25 ENCOUNTER — Telehealth: Payer: Self-pay | Admitting: Family Medicine

## 2012-01-25 NOTE — Telephone Encounter (Signed)
Dr Todd spoke with patient 

## 2012-01-25 NOTE — Telephone Encounter (Signed)
Pt states that inside of nose is very sore and irritated. No bleeding. Tried Neosporin. Would like a cream sent to [facility]. Please advise.

## 2012-03-04 ENCOUNTER — Other Ambulatory Visit: Payer: Self-pay | Admitting: *Deleted

## 2012-03-07 ENCOUNTER — Encounter: Payer: Self-pay | Admitting: Family Medicine

## 2012-03-07 ENCOUNTER — Ambulatory Visit (INDEPENDENT_AMBULATORY_CARE_PROVIDER_SITE_OTHER): Payer: BC Managed Care – PPO | Admitting: Family Medicine

## 2012-03-07 ENCOUNTER — Ambulatory Visit: Payer: BC Managed Care – PPO | Admitting: Family Medicine

## 2012-03-07 VITALS — BP 110/80 | Temp 97.8°F | Wt 115.0 lb

## 2012-03-07 DIAGNOSIS — I499 Cardiac arrhythmia, unspecified: Secondary | ICD-10-CM

## 2012-03-07 DIAGNOSIS — I4949 Other premature depolarization: Secondary | ICD-10-CM

## 2012-03-07 NOTE — Patient Instructions (Signed)
Increase the Tenormin to 1 tablet twice daily until the palpitations stop and go back to one a day  Remember in the future to avoid all pseudoephedrine like products

## 2012-03-07 NOTE — Progress Notes (Signed)
  Subjective:    Patient ID: [name], female    DOB: 1950/06/11, 62 y.o.   MRN: [id number]  HPI [name] is a 62 year old married female nonsmoker who comes in today for palpitations  She states about 2 weeks ago she took some pseudoephedrine which made her heart race. That lasted for about 2 days. Then she began having skipping. She's had a history of mitral valve prolapse with PVCs. She's currently on Tenormin 25 mg daily.   Review of Systems    cardiac review of systems otherwise negative Objective:   Physical Exam  Well-developed well nourished female no acute distress cardiac exam is normal  EKG shows a unifocal PVC      Assessment & Plan:  PVCs plan increase beta blocker again avoid pseudoephedrine

## 2012-03-14 ENCOUNTER — Telehealth: Payer: Self-pay | Admitting: Internal Medicine

## 2012-03-14 NOTE — Telephone Encounter (Signed)
Advised pt that she will need to be seen pt agreed. appt made.

## 2012-03-14 NOTE — Telephone Encounter (Signed)
New Problem:    Patient called in because she is currently taking atenolol (TENORMIN) 25 MG tablet and patient took sutafin and her heart began skipping beats.  After seeing her PCP was told to increase her atenolol (TENORMIN) 25 MG tablet and after a week of this her heart is still skipping beats.  Please call back.

## 2012-04-04 ENCOUNTER — Telehealth: Payer: Self-pay | Admitting: Family Medicine

## 2012-04-04 ENCOUNTER — Encounter: Payer: Self-pay | Admitting: Internal Medicine

## 2012-04-04 ENCOUNTER — Ambulatory Visit (INDEPENDENT_AMBULATORY_CARE_PROVIDER_SITE_OTHER): Payer: BC Managed Care – PPO | Admitting: Internal Medicine

## 2012-04-04 VITALS — BP 107/64 | HR 61 | Ht 62.0 in | Wt 117.0 lb

## 2012-04-04 DIAGNOSIS — R002 Palpitations: Secondary | ICD-10-CM

## 2012-04-04 NOTE — Progress Notes (Signed)
HPI Patient is a 62 yo woman who presents for reevaluation of palpitations The patent says that I saw her may years ago.  Records not available  The patient says that she has had a long history of palpitiations.  Isolated skips.  She has been on atenolol for these and has cut back at times.  Now on 25 qd Recently the patient notes that after taking pseudophed she noticed more isolated skips  She was seen by Jim Desanctis.  Has stopped using decongestant and continued on b blocker.  This has improved.  The patient otherwise has been doing well from a cardiac standpoint.  No SOB  No CP  Very active.  No dizziness Allergies  Allergen Reactions  . Codeine   . Compazine   . Gabapentin   . Prednisone     REACTION: makes heart beat hard \\T \ fast  . Sulfonamide Derivatives     Current Outpatient Prescriptions  Medication Sig Dispense Refill  . Ascorbic Acid (VITAMIN C) 100 MG tablet Take 100 mg by mouth daily.        Marland Kitchen aspirin 81 MG tablet Take 81 mg by mouth. ON OCCASION      . atenolol (TENORMIN) 25 MG tablet Take 1 tablet (25 mg total) by mouth daily.  90 tablet  3  . butalbital-aspirin-caffeine (FIORINAL) 50-325-40 MG per capsule TAKE 1 CAPSULE BY MOUTH EVERY 4 HOURS AS NEEDED HEADACHE  30 capsule  0  . calcium-vitamin D 250-100 MG-UNIT per tablet Take 1 tablet by mouth daily.        . cephALEXin (KEFLEX) 500 MG capsule ON HOLD WHILE PT IS TAKING THE TAMIFLU      . hydrocortisone (ANUSOL-HC) 2.5 % rectal cream Apply rectally at bedtime, x 3 weeks  30 g  1  . Lysine 500 MG TABS Take by mouth. DAILY      . MINIVELLE 0.0375 MG/24HR 1 PATCH TWICE A WEEK ON MON & tHUR      . omeprazole (PRILOSEC) 20 MG capsule Take 20 mg by mouth. AS NEEDED      . vitamin E 100 UNIT capsule Take 100 Units by mouth daily.         No current facility-administered medications for this visit.    Past Medical History  Diagnosis Date  . MVP (mitral valve prolapse) 1990    psa  . Rosacea   . LBP (low back pain)   .  Hypokalemia   . Migraine   . Anxiety   . Diverticulosis   . DDD (degenerative disc disease)     Past Surgical History  Procedure Laterality Date  . Abdominal hysterectomy  1985  . Repair knee ligament  1989    left  . Tubal ligation    . Tonsillectomy  1955  . Vein surgery    . Breast surgery      left breast bx: secondary to infection  . Salpingoophorectomy  1994    left  . Breast biopsy  2003    left    Family History  Problem Relation Age of Onset  . Heart disease Mother   . Heart disease Father   . Colitis Father   . Colon polyps Mother   . Colon polyps Father     History   Social History  . Marital Status: Married    Spouse Name: N/A    Number of Children: 2  . Years of Education: N/A   Occupational History  . retired school system  Social History Main Topics  . Smoking status: Never Smoker   . Smokeless tobacco: Never Used  . Alcohol Use: 0.5 oz/week    1 drink(s) per week     Comment: social wine  . Drug Use: No  . Sexually Active: Not on file   Other Topics Concern  . Not on file   Social History Narrative  . No narrative on file    Review of Systems:  All systems reviewed.  They are negative to the above problem except as previously stated.  Vital Signs: BP 107/64  Pulse 61  Ht 5\' 2"  (1.575 m)  Wt 117 lb (53.071 kg)  BMI 21.39 kg/m2  Physical Exam Patinet is in NAD HEENT:  Normocephalic, atraumatic. EOMI, PERRLA.  Neck: JVP is normal.  No bruits.  Lungs: clear to auscultation. No rales no wheezes.  Heart: Regular rate and rhythm. Normal S1, S2. No S3.   No significant murmurs. PMI not displaced.  Abdomen:  Supple, nontender. Normal bowel sounds. No masses. No hepatomegaly.  Extremities:   Good distal pulses throughout. No lower extremity edema.  Musculoskeletal :moving all extremities.  Neuro:   alert and oriented x3.  CN II-XII grossly intact.  EKG:  SR with PVC  61 bpm  Rhythm strip without PVCs  Assessment and Plan:  1.   Palpitations.  They sound like isolated skips, may be PVCs.  Overall improved. I reassured patient .  Her exam is normal.  I am not concerned that these are a harbinger of more cardiac problems.   I will review old records  Requested from storage. Stay active.  Keep on atenolol.  Avoid caffeine, decongestants, other stimulants Will see as needed.  2.  Hx MVP  Told had MVP in past.  No clicks or murmurs on exam

## 2012-04-04 NOTE — Telephone Encounter (Signed)
[facility] Triage Call Report Triage Record Num: [id number] Operator: Caswell Corwin Patient Name: [name] Call Date & Time: 04/02/2012 8:44:26AM Patient Phone: [phone] PCP: Eugenio Hoes. Todd Patient Gender: Female PCP Fax : [phone] Patient DOB: Jan 21, 1951 [facility] Name: [facility] Reason for Call: Caller: [name]/Patient; PCP: Kelle Darting [facility]); CB#: [phone]; Call regarding Cough/Congestion. She was seen at Urgent Care 04/01/12 and diagnosed with Flu and started on Tamilflu and Tessalon Pearles. She also had some antibiotics this past week for a Dental visit and ongoing finger infection. Now can she take anything else for the congestion? Triaged Upper Respiratory Infection. All emergent SX R/O. Disposition is to provide home care for ear congestion pressure and fullness. Home care and call back instructions given. Recommended Neti Pot. Protocol(s) Used: Upper Respiratory Infection (URI) Recommended Outcome per Protocol: Provide Home/Self Care Reason for Outcome: Ear congestion, pressure, fullness Care Advice: ~ Use a cool mist humidifier to moisten air. Be sure to clean according to manufacturer's instructions. ~ Call provider if symptoms do not resolve in 3 days or if new symptoms develop. Resting or sleeping with head elevated, such as semi-reclining in a recliner, may help reduce inner ear pressure and discomfort. ~ Consider nonprescription decongestant (Sudafed, Drixoral) for relief of symptoms after checking with a provider, especially if there is a history of hypertension, hyperthyroidism, heart disease, diabetes, glaucoma, urinary retention caused by prostatic hypertrophy. ~ Analgesic/Antipyretic Advice - Acetaminophen: Consider acetaminophen as directed on label or by pharmacist/provider for pain or fever PRECAUTIONS: - Use if there is no history of liver disease, alcoholism, or intake of three or more alcohol drinks per day - Only if  approved by provider during pregnancy or when breastfeeding - During pregnancy, acetaminophen should not be taken more than 3 consecutive days without telling provider - Do not exceed recommended dose or frequency ~ Speak with your provider as soon as possible if: - any temperature elevation in a frail elderly or immunocompromised patient (such as diabetes, HIV/AIDS, renal disease, chemotherapy, organ transplant, or chronic steroid use). - pregnant and temperature elevation of 100.5 F (38C) or above. - fever does not respond despite 2 doses of fever reducing medication. - fever responds to home care but persists for 3 days or more. ~ Analgesic/Antipyretic Advice - NSAIDs: Consider aspirin, ibuprofen, naproxen or ketoprofen for pain or fever as directed on label or by pharmacist/provider. PRECAUTIONS: - If over 64 years of age, should not take longer than 1 week without consulting provider. EXCEPTIONS: - Should not be used if taking blood thinners or have bleeding problems. - Do not use if have history of sensitivity/allergy to any of these medications; or history of cardiovascular, ulcer, kidney, liver disease or diabetes unless approved by provider. - Do not exceed recommended dose or frequency. ~ 04/02/2012 9:08:41AM Page 1 of 1 [id number]

## 2012-04-04 NOTE — Telephone Encounter (Signed)
Patient Information:  Caller Name: [name]  Phone: [phone]  Patient: ,   Gender: Female  DOB: [birth year]  Age: 62 Years  PCP: Kelle Darting [facility])  Office Follow Up:  Does the office need to follow up with this patient?: Yes  Instructions For The Office: Patient wishes for Dr Tawanna Cooler to know that [place] and is on Tamiflu.  Also taking Mucinex which is helping with cough and secretions.  RN Note:  Patient verbalized understanding of care advice.  Symptoms  Reason For Call & Symptoms: Has tested postive for the flu and is on Tamiflu, Bad cough, especially at ngiht  Reviewed Health History In EMR: Yes  Reviewed Medications In EMR: Yes  Reviewed Allergies In EMR: Yes  Reviewed Surgeries / Procedures: Yes  Date of Onset of Symptoms: 03/31/2012  Guideline(s) Used:  Cough  Disposition Per Guideline:   Home Care  Reason For Disposition Reached:   Cough with no complications  Advice Given:  Prevent Dehydration:  Drink adequate liquids.  This will help soothe an irritated or dry throat and loosen up the phlegm.  Avoid Tobacco Smoke:  Smoking or being exposed to smoke makes coughs much worse.  Cough Medicines:  Home Remedy - Honey: This old home remedy has been shown to help decrease coughing at night. The adult dosage is 2 teaspoons (10 ml) at bedtime. Honey should not be given to infants under one year of age.  Reassurance  Coughing is the way that our lungs remove irritants and mucus. It helps protect our lungs from getting pneumonia.  Here is some care advice that should help.  Coughing Spasms:  Drink warm fluids. Inhale warm mist (Reason: both relax the airway and loosen up the phlegm).  Prevent Dehydration:  Drink adequate liquids.  This will help soothe an irritated or dry throat and loosen up the phlegm.  Call Back If:  Difficulty breathing  You become worse.

## 2012-04-04 NOTE — Patient Instructions (Signed)
Follow up PRN with Dr. Tenny Craw.

## 2012-04-05 NOTE — Telephone Encounter (Signed)
Noted and Dr Tawanna Cooler informed

## 2012-04-20 ENCOUNTER — Encounter: Payer: Self-pay | Admitting: Internal Medicine

## 2012-04-20 ENCOUNTER — Ambulatory Visit (INDEPENDENT_AMBULATORY_CARE_PROVIDER_SITE_OTHER): Payer: BC Managed Care – PPO | Admitting: Family Medicine

## 2012-04-20 ENCOUNTER — Encounter: Payer: Self-pay | Admitting: Family Medicine

## 2012-04-20 VITALS — BP 110/70 | HR 65 | Temp 97.8°F | Wt 117.0 lb

## 2012-04-20 DIAGNOSIS — H109 Unspecified conjunctivitis: Secondary | ICD-10-CM

## 2012-04-20 MED ORDER — NEOMYCIN-POLYMYXIN-HC 3.5-10000-1 OP SUSP: 2.0000 [drp] | Freq: Four times a day (QID) | OPHTHALMIC | Status: DC

## 2012-04-21 ENCOUNTER — Encounter: Payer: Self-pay | Admitting: Family Medicine

## 2012-04-21 NOTE — Progress Notes (Signed)
  Subjective:    Patient ID: [name], female    DOB: [birth year], 62 y.o.   MRN: [id number]  HPI Here for 2 days of redness, burning, and crusty DC in the right eye. The left eye is fine. No URI syptoms. She did take care of several young grandchildren 2 days ago who both had colds.    Review of Systems  Constitutional: Negative.   HENT: Negative.   Eyes: Positive for discharge, redness and itching. Negative for photophobia, pain and visual disturbance.  Respiratory: Negative.        Objective:   Physical Exam  Constitutional: She appears well-developed and well-nourished.  HENT:  Right Ear: External ear normal.  Left Ear: External ear normal.  Nose: Nose normal.  Mouth/Throat: Oropharynx is clear and moist.  Eyes: Pupils are equal, round, and reactive to light.  Right conjunctiva is pink, cornea is clear. The left eye is clear.  Neck: No thyromegaly present.  Pulmonary/Chest: Effort normal and breath sounds normal.  Lymphadenopathy:    She has no cervical adenopathy.          Assessment & Plan:  Recheck prn

## 2012-04-26 ENCOUNTER — Other Ambulatory Visit: Payer: Self-pay | Admitting: *Deleted

## 2012-04-26 NOTE — Telephone Encounter (Signed)
Patient is calling requesting a refill of fiorinal. Okay to fill?

## 2012-04-27 MED ORDER — BUTALBITAL-ASA-CAFFEINE 50-325-40 MG PO CAPS: ORAL_CAPSULE | ORAL | Status: DC

## 2012-04-27 NOTE — Telephone Encounter (Signed)
Okay to refill per Dr Tawanna Cooler

## 2012-04-28 ENCOUNTER — Telehealth: Payer: Self-pay | Admitting: Internal Medicine

## 2012-04-28 ENCOUNTER — Telehealth: Payer: Self-pay | Admitting: Family Medicine

## 2012-04-28 NOTE — Telephone Encounter (Signed)
RX called in .

## 2012-04-29 NOTE — Telephone Encounter (Signed)
She needs an OV with me first to prevent her from possible cancellation due to anxiety.

## 2012-04-29 NOTE — Telephone Encounter (Signed)
Left a message for patient to call me. 

## 2012-04-29 NOTE — Telephone Encounter (Signed)
Spoke with patient and she is due for 10 year recall colonoscopy. She states she would like it to be done at [facility]. She has a tremendous fear of elevators. She takes the stairs everywhere because she has panic attacks in elevators. She states she is much worse with this than she was 10 years ago. She also has trouble with the taste of things and is concerned she will not be able to drink the prep. Please, advise.

## 2012-05-02 NOTE — Telephone Encounter (Signed)
Scheduled patient on 05/17/12 at 2:00 PM with Dr. Juanda Chance.

## 2012-05-05 ENCOUNTER — Encounter: Payer: Self-pay | Admitting: *Deleted

## 2012-05-16 IMAGING — CR DG CHEST 2V
2 series · 2 of 2 positions shown · non-contrast
Comparison: None.

CLINICAL DATA: Cough, hemoptysis, chest pain

CHEST - 2 VIEW

[view not recorded (1 of 2)]
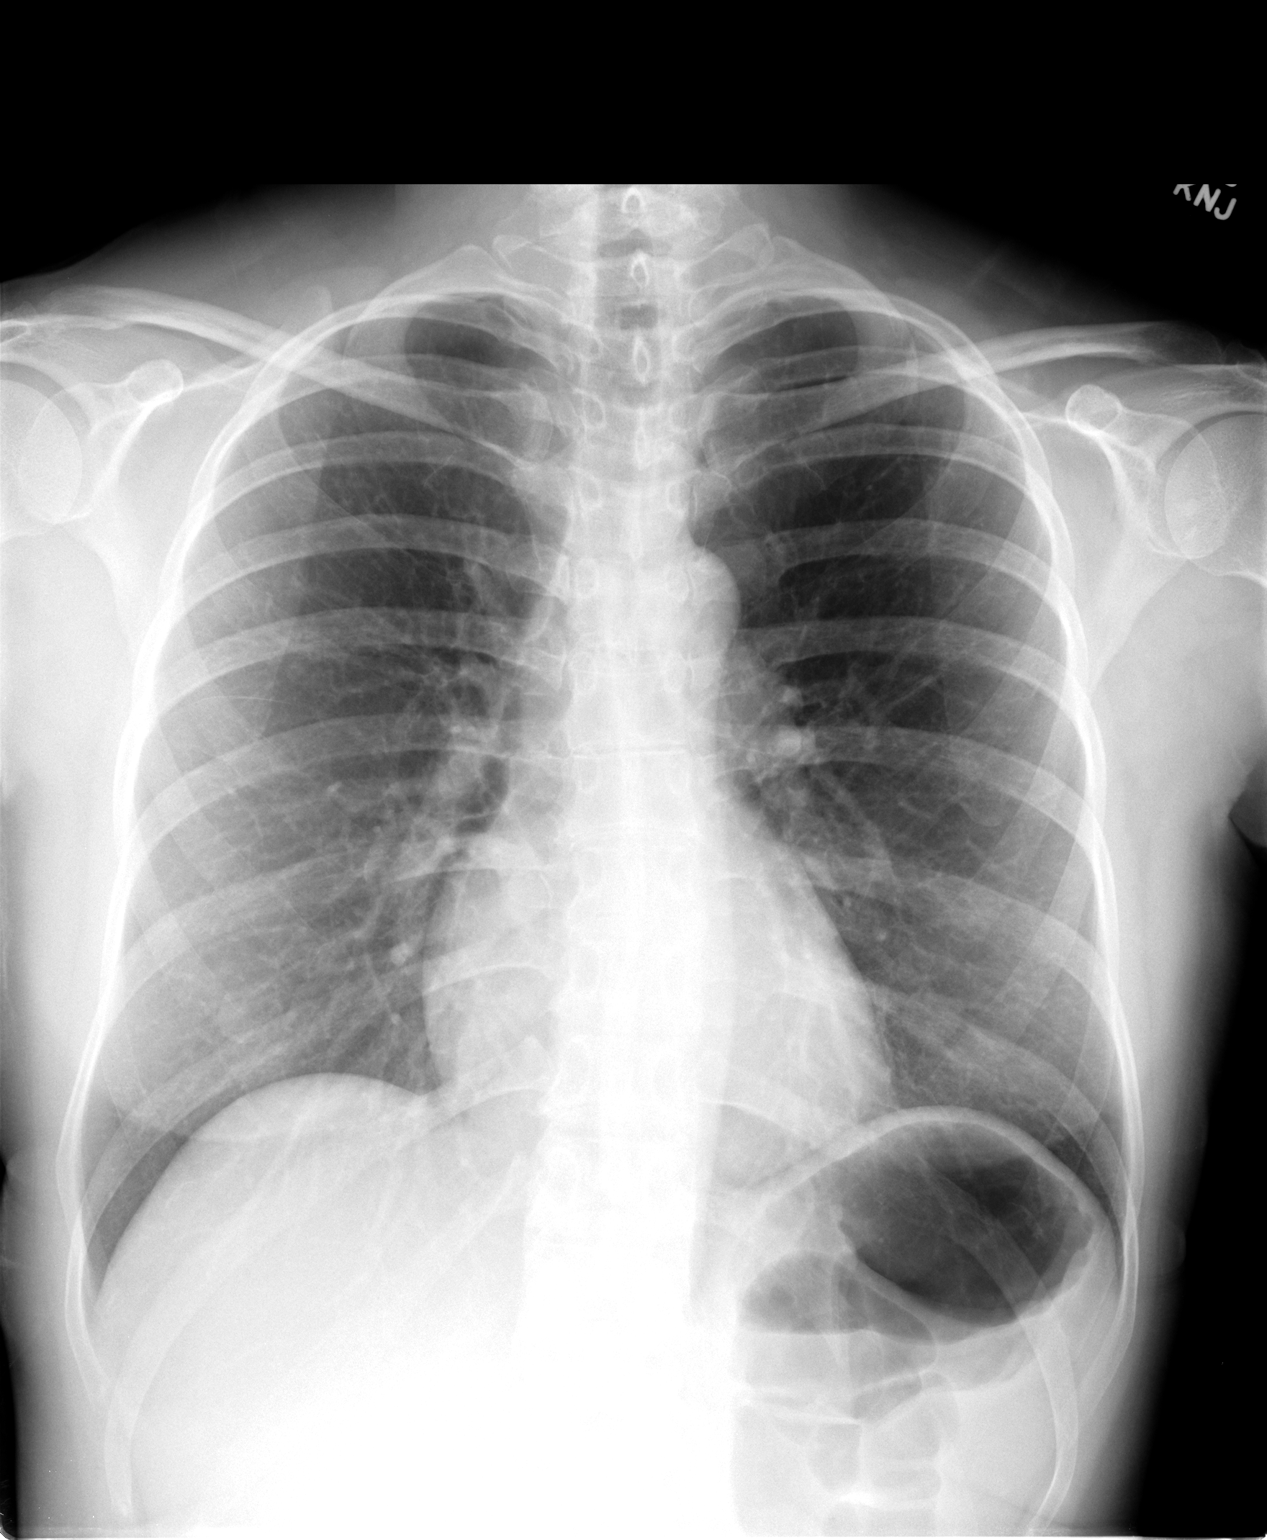

[view not recorded (2 of 2)]
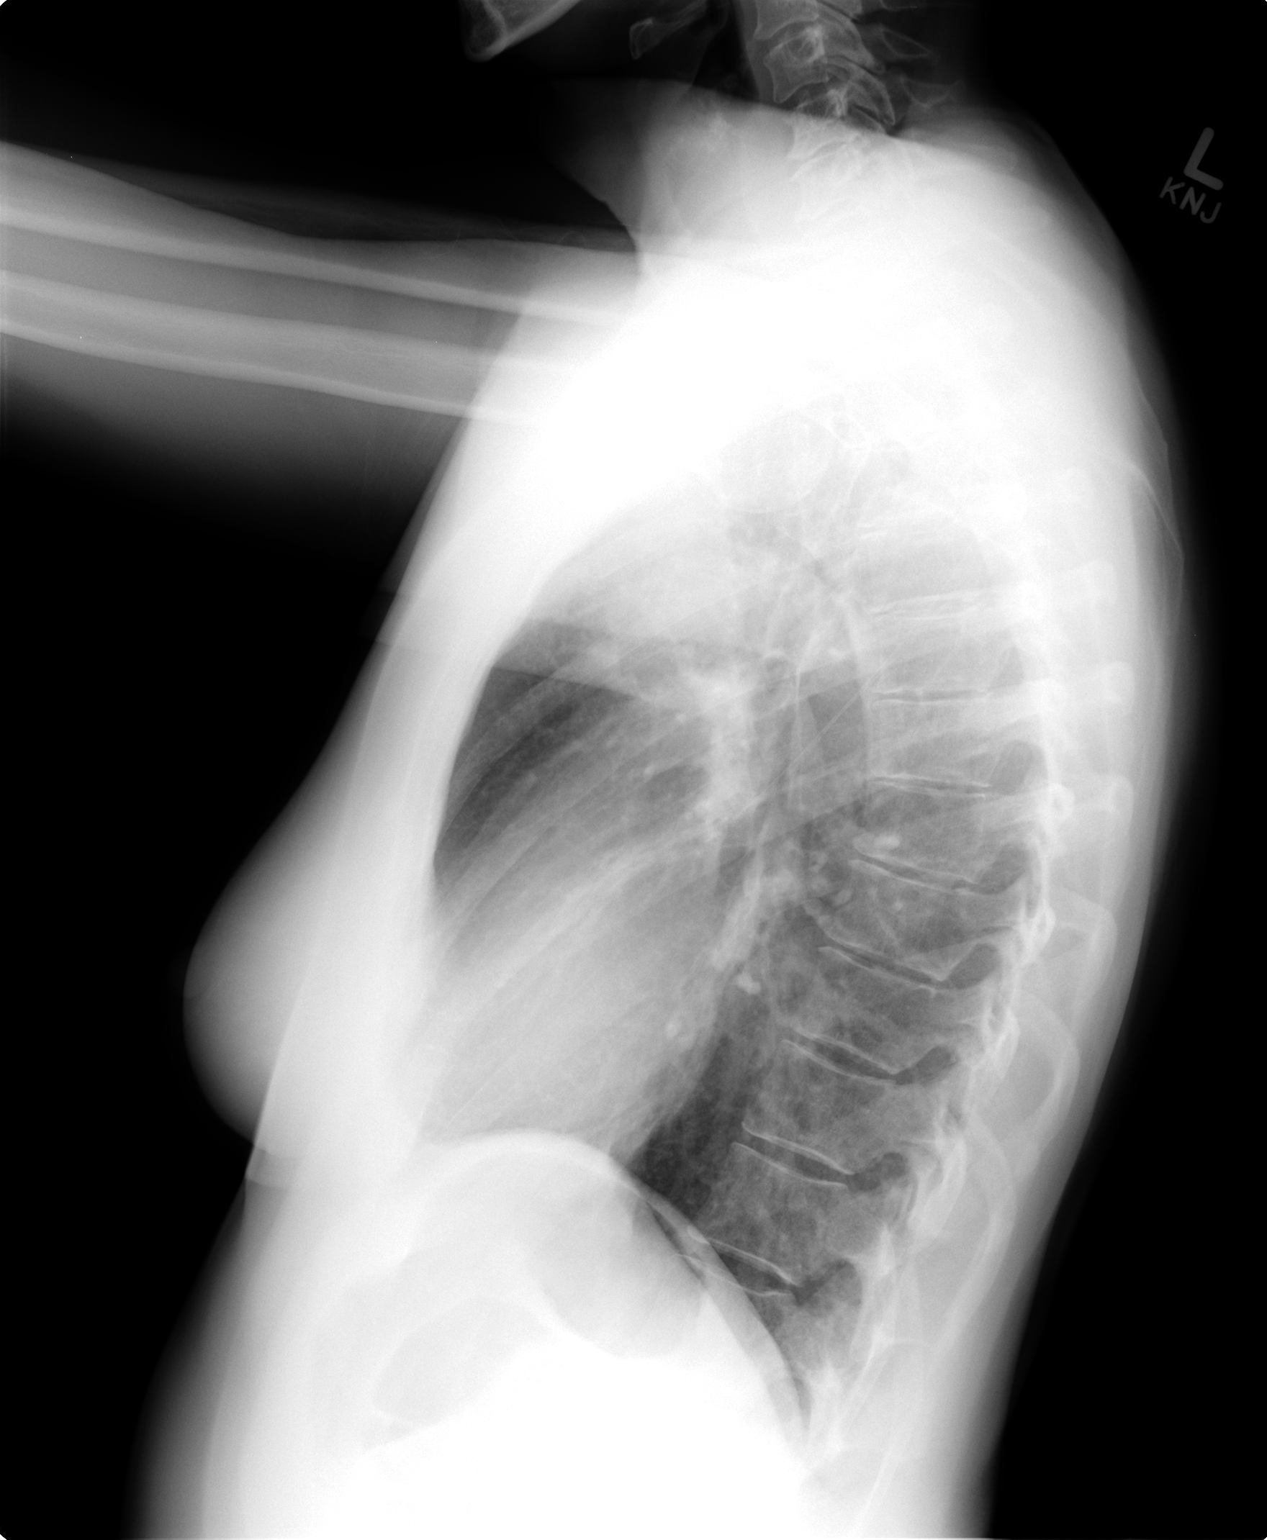

[2 of 2 positions shown; findings below may reference images not displayed]

FINDINGS: Cardiomediastinal silhouette is unremarkable.  No acute
infiltrate or pleural effusion.  No pulmonary edema.  Minimal lower
thoracic levoscoliosis.
IMPRESSION: No active disease.  Minimal lower thoracic levoscoliosis.

## 2012-05-17 ENCOUNTER — Encounter: Payer: Self-pay | Admitting: Internal Medicine

## 2012-05-17 ENCOUNTER — Ambulatory Visit (INDEPENDENT_AMBULATORY_CARE_PROVIDER_SITE_OTHER): Payer: BC Managed Care – PPO | Admitting: Internal Medicine

## 2012-05-17 VITALS — BP 98/60 | HR 63 | Ht 61.5 in | Wt 117.0 lb

## 2012-05-17 DIAGNOSIS — Z1211 Encounter for screening for malignant neoplasm of colon: Secondary | ICD-10-CM

## 2012-05-17 MED ORDER — MOVIPREP 100 G PO SOLR: 1.0000 | Freq: Once | ORAL | Status: DC

## 2012-05-17 MED ORDER — POTASSIUM CHLORIDE CRYS ER 20 MEQ PO TBCR: EXTENDED_RELEASE_TABLET | ORAL | Status: DC

## 2012-05-17 NOTE — Progress Notes (Signed)
PHYILLIS DASCOLI 1951-01-09 MRN [id number]  History of Present Illness:  This is a  62 year old white female who is due for a screening colonoscopy. She wanted to discuss the prep and the location of the procedure since she is afraid of elevators. She had a screening colonoscopy in March 2004 which showed moderate diverticulosis of the left colon. She has occasional constipation for which she takes stool softeners and probiotisc. She very rarely sees a small amount of blood which she attributes to straining. There is no family history of colon cancer.   Past Medical History  Diagnosis Date  . MVP (mitral valve prolapse) 1990    psa  . Rosacea   . LBP (low back pain)   . Hypokalemia   . Migraine   . Anxiety   . Diverticulosis   . DDD (degenerative disc disease)   . Melanoma   . Anal fissure   . Basal cell carcinoma    Past Surgical History  Procedure Laterality Date  . Abdominal hysterectomy  1985  . Repair knee ligament Left 1989  . Tubal ligation    . Tonsillectomy  1955  . Vein surgery    . Breast biopsy Left     secondary to infection  . Oophorectomy Left 1994    reports that she has never smoked. She has never used smokeless tobacco. She reports that she drinks about 0.5 ounces of alcohol per week. She reports that she does not use illicit drugs. family history includes Colitis in her father; Colon polyps in her father and mother; and Heart disease in her father and mother.  There is no history of Colon cancer. Allergies  Allergen Reactions  . Codeine   . Compazine   . Gabapentin   . Prednisone     REACTION: makes heart beat hard \\T \ fast  . Sulfonamide Derivatives         Review of Systems:  The remainder of the 10 point ROS is negative except as outlined in H&P   Assessment and Plan:  Problem #1 This is a 62 year old white female who is due for screening colonoscopy. We have discussed extensively her fear of elevators. She is willing to have the procedure done  in [facility] and will walk up the stairs for the procedure. On the way home, she wants to walk down the stairs with assistance. We have discussed moviprep, the sedation as well as the procedure itself. Because of frequent palpitations, I will give her supplemental potassium during her prep and on the day of the procedure. We will monitor her cardiac rhythm and will send a strip to her cardiologist.   05/17/2012 Lina Sar

## 2012-05-17 NOTE — Patient Instructions (Addendum)
You have been scheduled for a colonoscopy with propofol. Please follow written instructions given to you at your visit today.  Please pick up your prep kit at the pharmacy within the next 1-3 days. If you use inhalers (even only as needed), please bring them with you on the day of your procedure.  Dr Juanda Chance has requested that you take Potassium 20 mEq before your procedure as follows: Take 1 potassium tablet the morning before your test (05/30/12) Take 1 potassium tablet the day before your test AFTER drinking the 1st part of your prep. Take 1 potassium tablet the day of your test (05/31/12) AFTER drinking the 2nd part of your prep.  CC: Dr Tawanna Cooler

## 2012-05-31 ENCOUNTER — Telehealth: Payer: Self-pay | Admitting: Internal Medicine

## 2012-05-31 ENCOUNTER — Encounter: Payer: Self-pay | Admitting: Internal Medicine

## 2012-05-31 ENCOUNTER — Ambulatory Visit (AMBULATORY_SURGERY_CENTER): Payer: BC Managed Care – PPO | Admitting: Internal Medicine

## 2012-05-31 VITALS — BP 108/57 | HR 52 | Temp 96.8°F | Resp 9 | Ht 61.5 in | Wt 117.0 lb

## 2012-05-31 DIAGNOSIS — Z1211 Encounter for screening for malignant neoplasm of colon: Secondary | ICD-10-CM

## 2012-05-31 DIAGNOSIS — D126 Benign neoplasm of colon, unspecified: Secondary | ICD-10-CM

## 2012-05-31 MED ORDER — SODIUM CHLORIDE 0.9 % IV SOLN: 500.0000 mL | INTRAVENOUS | Status: DC

## 2012-05-31 NOTE — Progress Notes (Addendum)
Patient did not have preoperative order for IV antibiotic SSI prophylaxis. [phone])  Patient has card regarding colon clips.  Patient did not experience any of the following events: a burn prior to discharge; a fall within the facility; wrong site/side/patient/procedure/implant event; or a hospital transfer or hospital admission upon discharge from the facility. [phone])

## 2012-05-31 NOTE — Telephone Encounter (Signed)
Pt wondered if bowel movement would dislodge clip.  RN advised not to worry but immediately report an increase in amount or frequency of bleeding.  Asked size of polyp (15mm per report).

## 2012-05-31 NOTE — Patient Instructions (Addendum)
YOU HAD AN ENDOSCOPIC PROCEDURE TODAY AT THE [facility]: Refer to the procedure report that was given to you for any specific questions about what was found during the examination.  If the procedure report does not answer your questions, please call your gastroenterologist to clarify.  If you requested that your care partner not be given the details of your procedure findings, then the procedure report has been included in a sealed envelope for you to review at your convenience later.  YOU SHOULD EXPECT: Some feelings of bloating in the abdomen. Passage of more gas than usual.  Walking can help get rid of the air that was put into your GI tract during the procedure and reduce the bloating. If you had a lower endoscopy (such as a colonoscopy or flexible sigmoidoscopy) you may notice spotting of blood in your stool or on the toilet paper. If you underwent a bowel prep for your procedure, then you may not have a normal bowel movement for a few days. NO ASPIRIN NOR NSAIDS FOR 10 DAYS DUE TO METAL CLIPS IN YOUR COLON.  DIET: Your first meal following the procedure should be a light meal and then it is ok to progress to your normal diet.  A half-sandwich or bowl of soup is an example of a good first meal.  Heavy or fried foods are harder to digest and may make you feel nauseous or bloated.  Likewise meals heavy in dairy and vegetables can cause extra gas to form and this can also increase the bloating.  Drink plenty of fluids but you should avoid alcoholic beverages for 24 hours.  ACTIVITY: Your care partner should take you home directly after the procedure.  You should plan to take it easy, moving slowly for the rest of the day.  You can resume normal activity the day after the procedure however you should NOT DRIVE or use heavy machinery for 24 hours (because of the sedation medicines used during the test).    SYMPTOMS TO REPORT IMMEDIATELY: A gastroenterologist can be reached at any hour.  During  normal business hours, 8:30 AM to 5:00 PM Monday through Friday, call [phone].  After hours and on weekends, please call the GI answering service at [phone] who will take a message and have the physician on call contact you.   Following lower endoscopy (colonoscopy or flexible sigmoidoscopy):  Excessive amounts of blood in the stool  Significant tenderness or worsening of abdominal pains  Swelling of the abdomen that is new, acute  Fever of 100F or higher  FOLLOW UP: If any biopsies were taken you will be contacted by phone or by letter within the next 1-3 weeks.  Call your gastroenterologist if you have not heard about the biopsies in 3 weeks.  Our staff will call the home number listed on your records the next business day following your procedure to check on you and address any questions or concerns that you may have at that time regarding the information given to you following your procedure. This is a courtesy call and so if there is no answer at the home number and we have not heard from you through the emergency physician on call, we will assume that you have returned to your regular daily activities without incident.  SIGNATURES/CONFIDENTIALITY: You and/or your care partner have signed paperwork which will be entered into your electronic medical record.  These signatures attest to the fact that that the information above on your After Visit Summary  has been reviewed and is understood.  Full responsibility of the confidentiality of this discharge information lies with you and/or your care-partner. 

## 2012-05-31 NOTE — Progress Notes (Signed)
Called to room to assist during endoscopic procedure.  Patient ID and intended procedure confirmed with present staff. Received instructions for my participation in the procedure from the performing physician.  Attempt times once to use hot snare. Failed use cold snare and two clips applied.

## 2012-05-31 NOTE — Op Note (Signed)
[facility] [address] [place], [address]   COLONOSCOPY PROCEDURE REPORT  PATIENT: Tracey, Rivera  MR#: [id number] BIRTHDATE: December 09, 1950 , 61  yrs. old GENDER: Female ENDOSCOPIST: Hart Carwin, MD REFERRED BY:  recall colonoscopy PROCEDURE DATE:  05/31/2012 PROCEDURE:   Colonoscopy with snare polypectomy ASA CLASS:   Class II INDICATIONS:Average risk patient for colon cancer and last colon 2004- mild diverticulosis. MEDICATIONS: MAC sedation, administered by CRNA and propofol (Diprivan) 250mg  IV  DESCRIPTION OF PROCEDURE:   After the risks and benefits and of the procedure were explained, informed consent was obtained.  A digital rectal exam revealed no abnormalities of the rectum.    The LB [id number]-[id number]  endoscope was introduced through the anus and advanced to the cecum, which was identified by both the appendix and ileocecal valve .  The quality of the prep was excellent, using MoviPrep .  The instrument was then slowly withdrawn as the colon was fully examined.     COLON FINDINGS: A semi-pedunculated polyp measuring 15 mm in size with a friable surface was found in the rectum.  A polypectomy was performed using snare cautery.However the cauetery malfunctioned and polypectomy was carried out cold. Another  Cautery unit was brought in and the residual stalk was cauterized but, again no heat was generated. At this point 2 Endo clips were placed on the postpolypectomy site, resulting in complete hemostasis .  The resection was complete and the polyp tissue was completely retrieved.     Retroflexed views revealed no abnormalities.     The scope was then withdrawn from the patient and the procedure completed.  COMPLICATIONS: There were no complications. ENDOSCOPIC IMPRESSION: Semi-pedunculated polyp measuring 15 mm in size was found in the rectum; polypectomy was performed using snare cautery , attempted hot snare but  due to cautery  malfunction it was a cold snare polypectomt.  Endo clips applied to the polypectomy site resulting in complete hemostasis.  RECOMMENDATIONS: Await pathology results No ASA or NSAIDs x 10 days.   REPEAT EXAM: In 5 year(s)  for Colonoscopy.  cc:  _______________________________ eSignedHart Carwin, MD 05/31/2012 10:26 AM     PATIENT NAME:  Tracey, Rivera MR#: [id number]

## 2012-06-01 ENCOUNTER — Telehealth: Payer: Self-pay | Admitting: Family Medicine

## 2012-06-01 ENCOUNTER — Telehealth: Payer: Self-pay | Admitting: *Deleted

## 2012-06-01 NOTE — Telephone Encounter (Signed)
Pt had colonoscopy yesterday. A polyp was removed near the rectum.  Pt wanted you to know.

## 2012-06-01 NOTE — Telephone Encounter (Signed)
  Follow up Call-  Call back number 05/31/2012  Post procedure Call Back phone  # [phone]  Permission to leave phone message Yes     Patient questions:  Do you have a fever, pain , or abdominal swelling? no Pain Score  0 *  Have you tolerated food without any problems? yes  Have you been able to return to your normal activities? yes  Do you have any questions about your discharge instructions: Diet   no Medications  no Follow up visit  no  Do you have questions or concerns about your Care? no  Actions: * If pain score is 4 or above: No action needed, pain <4.  Spoke with spouse

## 2012-06-02 ENCOUNTER — Telehealth: Payer: Self-pay | Admitting: Internal Medicine

## 2012-06-02 NOTE — Telephone Encounter (Signed)
Patient states she is just checking in to let us know she has had 2 bowel movements and has had bright, red blood small amount with both. She also had some blood on the tissue paper when wiping. The amount has not increased. Reassured patient this can occur for several days. She understands to call us if bleeding increases or has pain.

## 2012-06-02 NOTE — Telephone Encounter (Signed)
She could have blood in stool for 1 week. If she calls again, please, obtain CBC,

## 2012-06-07 ENCOUNTER — Encounter: Payer: Self-pay | Admitting: Internal Medicine

## 2012-06-10 ENCOUNTER — Telehealth: Payer: Self-pay | Admitting: Internal Medicine

## 2012-06-10 NOTE — Telephone Encounter (Signed)
Informed pt of path results and the implications of the adenomatous polyp. Pt reports she was afraid she had rectal cancer and we then discussed her COLON further; she stated understanding. She has not received her path letter so I mailed another one with the path report.

## 2012-07-01 ENCOUNTER — Telehealth: Payer: Self-pay | Admitting: Family Medicine

## 2012-07-01 NOTE — Telephone Encounter (Signed)
Patient Information:  Caller Name: Darel Hong  Phone: [phone]  Patient: [name]  Gender: Female  DOB: 30-Aug-1950  Age: 63 Years  PCP: Kelle Darting [facility])  Office Follow Up:  Does the office need to follow up with this patient?: No  Instructions For The Office: N/A  RN Note:  [facility] [address] [place],  [phone].  Per standing orders will phone in RX to pharmacy provided.  Symptoms  Reason For Call & Symptoms: Pt treated for conjunctivitis.  Pt left medication at home for pink eyel  Sx are presenting again in right eye.  Crusting in the eye lashes and eye stuck together in am.    Reviewed Health History In EMR: Yes  Reviewed Medications In EMR: Yes  Reviewed Allergies In EMR: Yes  Reviewed Surgeries / Procedures: Yes  Date of Onset of Symptoms: 06/24/2012  Treatments Tried: Flushed eye.  Treatments Tried Worked: No  Guideline(s) Used:  Eye - Pus or Discharge  Disposition Per Guideline:   Home Care  Reason For Disposition Reached:   Eye with yellow/green discharge or eyelashes stick together, and PCP standing order to call in antibiotic eye drops  Advice Given:  Eyelid Cleansing:   Gently wash eyelids and lashes with warm water and wet cotton balls (or cotton gauze). Remove all the dried and liquid pus.  Do this as often as needed.  Contagiousness:  Pinkeye is contagious. It is very easy to spread to other people. You can spread it by shaking hands.  Try not to touch your eyes. Wash your hands often. Do not share towels. Try not to touch your eyes. Wash your hands frequently. Do not share towels.  You may return to work or school. Avoid physical contact (e.g., shaking hands) until the symptoms have resolved.  Call Back If  Pus increases in amount  Pus in corner of eye lasts over 3 days  Eyelid becomes red or swollen  You become worse.  Patient Will Follow Care Advice:  YES

## 2012-07-25 ENCOUNTER — Ambulatory Visit: Payer: BC Managed Care – PPO | Attending: Physical Medicine and Rehabilitation | Admitting: Physical Therapy

## 2012-07-25 DIAGNOSIS — M545 Low back pain, unspecified: Secondary | ICD-10-CM | POA: Insufficient documentation

## 2012-07-25 DIAGNOSIS — IMO0001 Reserved for inherently not codable concepts without codable children: Secondary | ICD-10-CM | POA: Insufficient documentation

## 2012-07-27 ENCOUNTER — Ambulatory Visit: Payer: BC Managed Care – PPO | Admitting: Physical Therapy

## 2012-07-28 ENCOUNTER — Ambulatory Visit: Payer: BC Managed Care – PPO | Admitting: Physical Therapy

## 2012-08-01 ENCOUNTER — Encounter: Payer: BC Managed Care – PPO | Admitting: Physical Therapy

## 2012-09-09 ENCOUNTER — Encounter (HOSPITAL_BASED_OUTPATIENT_CLINIC_OR_DEPARTMENT_OTHER): Payer: Self-pay | Admitting: *Deleted

## 2012-09-09 ENCOUNTER — Other Ambulatory Visit: Payer: Self-pay | Admitting: Orthopedic Surgery

## 2012-09-09 ENCOUNTER — Encounter (HOSPITAL_BASED_OUTPATIENT_CLINIC_OR_DEPARTMENT_OTHER)
Admission: RE | Disposition: A | Discharge status: Home / Self Care | Payer: Self-pay | Source: Ambulatory Visit | Attending: Orthopedic Surgery

## 2012-09-09 ENCOUNTER — Ambulatory Visit (HOSPITAL_BASED_OUTPATIENT_CLINIC_OR_DEPARTMENT_OTHER)
Admission: RE | Admit: 2012-09-09 | Discharge: 2012-09-09 | Disposition: A | Discharge status: Home / Self Care | Payer: BC Managed Care – PPO | Source: Ambulatory Visit | Attending: Orthopedic Surgery | Admitting: Orthopedic Surgery

## 2012-09-09 DIAGNOSIS — L03019 Cellulitis of unspecified finger: Secondary | ICD-10-CM | POA: Insufficient documentation

## 2012-09-09 HISTORY — PX: I&D EXTREMITY: SHX5045

## 2012-09-09 SURGERY — MINOR IRRIGATION AND DEBRIDEMENT EXTREMITY
Anesthesia: LOCAL | Site: Thumb | Laterality: Right | Wound class: Dirty or Infected

## 2012-09-09 MED ORDER — CHLORHEXIDINE GLUCONATE 4 % EX LIQD: 60.0000 mL | Freq: Once | CUTANEOUS | Status: DC

## 2012-09-09 MED ORDER — BUPIVACAINE HCL (PF) 0.25 % IJ SOLN
INTRAMUSCULAR | Status: DC | PRN
  Administered 2012-09-09: 3 mL

## 2012-09-09 MED ORDER — HYDROCODONE-ACETAMINOPHEN 5-325 MG PO TABS: 1.0000 | ORAL_TABLET | Freq: Four times a day (QID) | ORAL | Status: DC | PRN

## 2012-09-09 MED ORDER — LIDOCAINE HCL (PF) 1 % IJ SOLN
INTRAMUSCULAR | Status: DC | PRN
  Administered 2012-09-09: 3 mL

## 2012-09-09 SURGICAL SUPPLY — 45 items
BAG DECANTER FOR FLEXI CONT (MISCELLANEOUS) IMPLANT
BANDAGE GAUZE ELAST BULKY 4 IN (GAUZE/BANDAGES/DRESSINGS) IMPLANT
BLADE MINI RND TIP GREEN BEAV (BLADE) IMPLANT
BLADE SURG 15 STRL LF DISP TIS (BLADE) ×1 IMPLANT
BLADE SURG 15 STRL SS (BLADE) ×2
BNDG CMPR 9X4 STRL LF SNTH (GAUZE/BANDAGES/DRESSINGS)
BNDG COHESIVE 1X5 TAN STRL LF (GAUZE/BANDAGES/DRESSINGS) ×2 IMPLANT
BNDG COHESIVE 3X5 TAN STRL LF (GAUZE/BANDAGES/DRESSINGS) IMPLANT
BNDG ESMARK 4X9 LF (GAUZE/BANDAGES/DRESSINGS) IMPLANT
CHLORAPREP W/TINT 26ML (MISCELLANEOUS) ×2 IMPLANT
CLOTH BEACON ORANGE TIMEOUT ST (SAFETY) ×2 IMPLANT
CORDS BIPOLAR (ELECTRODE) IMPLANT
COVER MAYO STAND STRL (DRAPES) ×2 IMPLANT
CUFF TOURNIQUET SINGLE 18IN (TOURNIQUET CUFF) IMPLANT
DRAIN PENROSE 1/2X12 LTX STRL (WOUND CARE) IMPLANT
DRAIN PENROSE 1/4X12 LTX STRL (WOUND CARE) IMPLANT
DRAPE SURG 17X23 STRL (DRAPES) IMPLANT
GAUZE PACKING IODOFORM 1/4X5 (PACKING) IMPLANT
GAUZE XEROFORM 1X8 LF (GAUZE/BANDAGES/DRESSINGS) ×2 IMPLANT
GLOVE BIO SURGEON STRL SZ 6.5 (GLOVE) ×2 IMPLANT
GLOVE BIOGEL PI IND STRL 8 (GLOVE) ×1 IMPLANT
GLOVE BIOGEL PI INDICATOR 8 (GLOVE) ×1
GLOVE SURG ORTHO 8.0 STRL STRW (GLOVE) ×2 IMPLANT
LOOP VESSEL MAXI BLUE (MISCELLANEOUS) IMPLANT
NEEDLE 27GAX1X1/2 (NEEDLE) ×2 IMPLANT
NS IRRIG 1000ML POUR BTL (IV SOLUTION) ×2 IMPLANT
PACK BASIN DAY SURGERY FS (CUSTOM PROCEDURE TRAY) ×2 IMPLANT
PAD CAST 3X4 CTTN HI CHSV (CAST SUPPLIES) IMPLANT
PADDING CAST ABS 4INX4YD NS (CAST SUPPLIES)
PADDING CAST ABS COTTON 4X4 ST (CAST SUPPLIES) IMPLANT
PADDING CAST COTTON 3X4 STRL (CAST SUPPLIES)
SPLINT FINGER 5/8X3.25 (SOFTGOODS) ×1 IMPLANT
SPLINT FINGER FOAM 3 9119 05 (SOFTGOODS) ×2
SPLINT PLASTER CAST XFAST 3X15 (CAST SUPPLIES) IMPLANT
SPLINT PLASTER XTRA FASTSET 3X (CAST SUPPLIES)
SPONGE GAUZE 4X4 12PLY (GAUZE/BANDAGES/DRESSINGS) ×2 IMPLANT
SUT VICRYL RAPID 5 0 P 3 (SUTURE) IMPLANT
SUT VICRYL RAPIDE 4/0 PS 2 (SUTURE) IMPLANT
SWAB COLLECTION DEVICE MRSA (MISCELLANEOUS) ×2 IMPLANT
SYR BULB 3OZ (MISCELLANEOUS) ×2 IMPLANT
SYR CONTROL 10ML LL (SYRINGE) ×2 IMPLANT
TOWEL OR 17X24 6PK STRL BLUE (TOWEL DISPOSABLE) ×2 IMPLANT
TUBE ANAEROBIC SPECIMEN COL (MISCELLANEOUS) ×2 IMPLANT
TUBE FEEDING 5FR 15 INCH (TUBING) IMPLANT
UNDERPAD 30X30 INCONTINENT (UNDERPADS AND DIAPERS) ×2 IMPLANT

## 2012-09-09 NOTE — H&P (Signed)
[name] returns to the office today for examination of her right thumb.  She is a 62 year-old right-hand dominant female, she has had pain and bruising of her right thumb.  This began this past week.  She was placed on doxycycline 50 mg., she has only taken one. She states it began as a bruise in the thumb nail, became very red and swollen and has increased over the past day or two.  She recalls no history of diabetes, she is not taking any other medications for this.  She is preparing to leave for [facility].  ALLERGIES:   Codeine.  MEDICATIONS:  Atenolol, doxycycline, meloxicam.  SURGICAL HISTORY:  Hysterectomy, tonsillectomy.  FAMILY MEDICAL HISTORY:  Positive for heart disease, high blood pressure and arthritis.  SOCIAL HISTORY:  She does not smoke or drink, married and retired.   REVIEW OF SYSTEMS:   Positive for blood in her stool, headaches, otherwise negative 14 points [name] is an 62 y.o. female.   Chief Complaint: paronychia rt thumb HPI: see above  Past Medical History  Diagnosis Date  . MVP (mitral valve prolapse) 1990    psa  . Rosacea   . LBP (low back pain)   . Hypokalemia   . Migraine   . Anxiety   . Diverticulosis   . DDD (degenerative disc disease)   . Melanoma   . Anal fissure   . Basal cell carcinoma   . Anemia     during pregnancy  . Ulcer     as a child    Past Surgical History  Procedure Laterality Date  . Abdominal hysterectomy  1985  . Repair knee ligament Left 1989  . Tubal ligation    . Tonsillectomy  1955  . Vein surgery    . Breast biopsy Left     secondary to infection  . Oophorectomy Left 1994    Family History  Problem Relation Age of Onset  . Heart disease Mother   . Colon polyps Mother   . Heart disease Father   . Colitis Father   . Colon polyps Father   . Colon cancer Neg Hx   . Esophageal cancer Maternal Aunt    Social History:  reports that she has never smoked. She has never used smokeless tobacco. She  reports that she drinks about 0.5 ounces of alcohol per week. She reports that she does not use illicit drugs.  Allergies:  Allergies  Allergen Reactions  . Codeine   . Compazine   . Gabapentin   . Prednisone     REACTION: makes heart beat hard \\T \ fast  . Sulfonamide Derivatives     Medications Prior to Admission  Medication Sig Dispense Refill  . Ascorbic Acid (VITAMIN C) 100 MG tablet Take 100 mg by mouth daily.        Marland Kitchen aspirin 81 MG tablet Take 81 mg by mouth. ON OCCASION      . atenolol (TENORMIN) 25 MG tablet Take 1 tablet (25 mg total) by mouth daily.  90 tablet  3  . butalbital-aspirin-caffeine (FIORINAL) 50-325-40 MG per capsule TAKE 1 CAPSULE BY MOUTH EVERY 4 HOURS AS NEEDED HEADACHE  90 capsule  1  . calcium-vitamin D 250-100 MG-UNIT per tablet Take 1 tablet by mouth daily.        . hydrocortisone (ANUSOL-HC) 2.5 % rectal cream Apply rectally at bedtime, x 3 weeks  30 g  1  . meloxicam (MOBIC) 15 MG tablet Take 15 mg  by mouth daily.      Marland Kitchen MINIVELLE 0.0375 MG/24HR 1 PATCH TWICE A WEEK ON MON & tHUR      . neomycin-polymyxin-hydrocortisone (CORTISPORIN) 3.5-10000-1 ophthalmic suspension Place 2 drops into the right eye 4 (four) times daily.  7.5 mL  0  . omeprazole (PRILOSEC) 20 MG capsule Take 20 mg by mouth. AS NEEDED      . vitamin E 100 UNIT capsule Take 100 Units by mouth daily.        . cephALEXin (KEFLEX) 500 MG capsule ON HOLD WHILE PT IS TAKING THE TAMIFLU      . Lysine 500 MG TABS Take by mouth. DAILY      . potassium chloride SA (K-DUR,KLOR-CON) 20 MEQ tablet Use as directed with colonoscopy prep  5 tablet  0  . Urea Nail Film 40 % SUSP         No results found for this or any previous visit (from the past 48 hour(s)).  No results found.   Pertinent items are noted in HPI.  Blood pressure 137/71, pulse 59, temperature 97.6 F (36.4 C), temperature source Oral, resp. rate 20, SpO2 100.00%.  General appearance: alert, cooperative and appears stated  age Head: Normocephalic, without obvious abnormality Neck: no JVD Resp: clear to auscultation bilaterally Cardio: regular rate and rhythm, S1, S2 normal, no murmur, click, rub or gallop GI: soft, non-tender; bowel sounds normal; no masses,  no organomegaly Extremities: extremities normal, atraumatic, no cyanosis or edema Pulses: 2+ and symmetric Skin: Skin color, texture, turgor normal. No rashes or lesions Neurologic: Grossly normal Incision/Wound: Nail bed infection  Assessment/Plan RADIOGRAPHS:   X-rays are negative.    RECOMMENDATIONS/PLAN:   We would recommend removal of her nail with increasing her doxycycline.  We will see her back on Monday for whirlpool.  This will be scheduled today as an outpatient under metacarpal block for I&D right thumb. We will plan on removal of the nail plate.  Rhealyn Cullen R 09/09/2012, 11:51 AM

## 2012-09-09 NOTE — Brief Op Note (Signed)
09/09/2012  12:50 PM  PATIENT:  [name]  62 y.o. female  PRE-OPERATIVE DIAGNOSIS:  paronychia right thumb  POST-OPERATIVE DIAGNOSIS:  paronychia right thumb  PROCEDURE:  Procedure(s): Minor IRRIGATION AND DEBRIDEMENT EXTREMITY paronychia of right thumb (Right)  SURGEON:  Surgeon(s) and Role:    * Nicki Reaper, MD - Primary  PHYSICIAN ASSISTANT:   ASSISTANTS: none   ANESTHESIA:   local  EBL:     BLOOD ADMINISTERED:none  DRAINS: none   LOCAL MEDICATIONS USED:  MARCAINE    and XYLOCAINE   SPECIMEN:  Source of Specimen:  C&S  DISPOSITION OF SPECIMEN:  PATHOLOGY  COUNTS:  YES  TOURNIQUET:   Total Tourniquet Time Documented: area (Right) - 14 minutes Total: area (Right) - 14 minutes   DICTATION: .Other Dictation: Dictation Number [id number]  PLAN OF CARE: Discharge to home after PACU  PATIENT DISPOSITION:  PACU - hemodynamically stable.

## 2012-09-09 NOTE — Op Note (Signed)
NAMEHOLLEE, FATE NO.:  [id number]  MEDICAL RECORD NO.:  [id number]  LOCATION:                                 FACILITY:  PHYSICIAN:  Cindee Salt, M.D.       DATE OF BIRTH:  1951-02-07  DATE OF PROCEDURE:  09/09/2012 DATE OF DISCHARGE:                              OPERATIVE REPORT   PREOPERATIVE DIAGNOSIS:  Paronychia, right thumb.  POSTOPERATIVE DIAGNOSIS:  Paronychia, right thumb.  OPERATION:  I and D paronychia with removal of nail plate.  SURGEON:  Cindee Salt, MD  ANESTHESIA:  Metacarpal block.  HISTORY:  The patient is a 62 year old female with a history of a week and half of swelling of her right thumb paronychial area.  She has been on doxycycline 50 mg a day.  This has progressed.  She has an area of fluctuance under the nail plate proximally.  She is admitted for incision and drainage with removal of the nail plate.  Pre, peri, and postoperative course have been discussed along with risks and complications.  She is aware of the possibility of further infection, inadequate drainage, possibility of further debridement, irregularity of the nail matrix as it regrows.  In the preoperative area, the patient was seen, the extremity marked by both patient and surgeon.  DESCRIPTION OF PROCEDURE:  The patient was brought to the operating room, where a metacarpal block was given with 0.25% Marcaine without epinephrine, 1% Xylocaine without epinephrine, 8 mL was used.  A Penrose drain was used for tourniquet control at the base of the finger.  After adequate anesthesia was afforded, the nail plate was removed and lifting the proximal nail fold, pus immediately extruded from the area.  This was cultured for both aerobic and anaerobic bacteria.  The nail plate was entirely removed.  The area copiously irrigated with saline, packed open.  Sterile compressive dressing and splint was applied.  On deflation of the removal of the Penrose, the finger immediately  pinked. This was placed before the compressive dressing and splint was applied. The patient tolerated the procedure well and was taken to the recovery room.  She was discharged on Vicodin and doxycycline.          ______________________________ Cindee Salt, M.D.     GK/MEDQ  D:  09/09/2012  T:  09/09/2012  Job:  [id number]

## 2012-09-09 NOTE — Op Note (Signed)
Dictation Number [id number]

## 2012-09-12 LAB — WOUND CULTURE: Gram Stain: NONE SEEN

## 2012-09-13 ENCOUNTER — Encounter (HOSPITAL_BASED_OUTPATIENT_CLINIC_OR_DEPARTMENT_OTHER): Payer: Self-pay | Admitting: Orthopedic Surgery

## 2012-09-26 ENCOUNTER — Telehealth: Payer: Self-pay | Admitting: Family Medicine

## 2012-09-26 NOTE — Telephone Encounter (Signed)
OTC Prilosec twice daily

## 2012-09-26 NOTE — Telephone Encounter (Signed)
Fleet Contras - pt could not get through to Triage. She states she's having increased indigestion problems - unable to eat. She is out of town but has had this before. Wants to know if there is any OTC treatment you can recommend. Please call.

## 2012-09-26 NOTE — Telephone Encounter (Signed)
Patient is aware and she will try the prilosec

## 2012-09-27 ENCOUNTER — Telehealth: Payer: Self-pay | Admitting: Internal Medicine

## 2012-09-27 NOTE — Telephone Encounter (Signed)
Patient states when she eats it uncomfortable. States it hurts at the top of her stomach right under her bra. She called her PCP and was told to take Prilosec OTC BID. She has done this for 2 days and it is not helping. She does report lots of stress. [facility] [place] [facility] [employer] [facility] [place] Please, advise.

## 2012-09-27 NOTE — Telephone Encounter (Signed)
Please call in GI cocktail,  8 oz, 2 tablesp po tid prn epigastric pain

## 2012-09-28 NOTE — Telephone Encounter (Signed)
Patient has an allergy to Compazine. A warning comes up with GI cocktail. Please, advise.

## 2012-09-28 NOTE — Telephone Encounter (Signed)
Gaviscon liquid 30cc po pc and hs

## 2012-09-28 NOTE — Telephone Encounter (Signed)
Left a message for patient to call me. 

## 2012-09-28 NOTE — Telephone Encounter (Signed)
Spoke with patient and gave her Dr. Brodie's recommendation.  

## 2012-10-02 LAB — FUNGUS CULTURE W SMEAR: Fungal Smear: NONE SEEN

## 2012-10-22 LAB — AFB CULTURE WITH SMEAR (NOT AT ARMC): Acid Fast Smear: NONE SEEN

## 2012-10-27 ENCOUNTER — Other Ambulatory Visit: Payer: Self-pay | Admitting: Dermatology

## 2012-12-15 ENCOUNTER — Other Ambulatory Visit: Payer: Self-pay

## 2012-12-15 ENCOUNTER — Other Ambulatory Visit (INDEPENDENT_AMBULATORY_CARE_PROVIDER_SITE_OTHER): Payer: BC Managed Care – PPO

## 2012-12-15 DIAGNOSIS — Z0000 Encounter for general adult medical examination without abnormal findings: Secondary | ICD-10-CM

## 2012-12-15 LAB — TSH: TSH: 0.69 u[IU]/mL (ref 0.35–5.50)

## 2012-12-15 LAB — POCT URINALYSIS DIPSTICK
Bilirubin, UA: NEGATIVE
Blood, UA: NEGATIVE
Leukocytes, UA: NEGATIVE
Nitrite, UA: NEGATIVE
Protein, UA: NEGATIVE
Urobilinogen, UA: 0.2
pH, UA: 7

## 2012-12-15 LAB — CBC WITH DIFFERENTIAL/PLATELET
Basophils Relative: 0.6 % (ref 0.0–3.0)
Eosinophils Relative: 1.7 % (ref 0.0–5.0)
HCT: 38.8 % (ref 36.0–46.0)
Hemoglobin: 13.1 g/dL (ref 12.0–15.0)
Lymphs Abs: 2 10*3/uL (ref 0.7–4.0)
MCV: 93.5 fl (ref 78.0–100.0)
Monocytes Absolute: 0.6 10*3/uL (ref 0.1–1.0)
Monocytes Relative: 7.7 % (ref 3.0–12.0)
Neutro Abs: 4.5 10*3/uL (ref 1.4–7.7)
Platelets: 269 10*3/uL (ref 150.0–400.0)
WBC: 7.3 10*3/uL (ref 4.5–10.5)

## 2012-12-15 LAB — LIPID PANEL
HDL: 57.6 mg/dL (ref >39.00)
Total CHOL/HDL Ratio: 3

## 2012-12-15 LAB — BASIC METABOLIC PANEL
BUN: 15 mg/dL (ref 6–23)
Chloride: 102 mEq/L (ref 96–112)
GFR: 85.24 mL/min (ref >60.00)
Glucose, Bld: 98 mg/dL (ref 70–99)
Potassium: 4.2 mEq/L (ref 3.5–5.1)
Sodium: 139 mEq/L (ref 135–145)

## 2012-12-15 LAB — HEPATIC FUNCTION PANEL
ALT: 14 U/L (ref 0–35)
AST: 17 U/L (ref 0–37)
Albumin: 4.2 g/dL (ref 3.5–5.2)
Total Bilirubin: 0.6 mg/dL (ref 0.3–1.2)
Total Protein: 7.5 g/dL (ref 6.0–8.3)

## 2012-12-26 ENCOUNTER — Encounter: Payer: Self-pay | Admitting: Family Medicine

## 2012-12-27 ENCOUNTER — Encounter: Payer: Self-pay | Admitting: Family Medicine

## 2012-12-27 ENCOUNTER — Ambulatory Visit (INDEPENDENT_AMBULATORY_CARE_PROVIDER_SITE_OTHER): Payer: BC Managed Care – PPO | Admitting: Family Medicine

## 2012-12-27 VITALS — BP 110/70 | Temp 97.6°F | Ht 61.25 in | Wt 116.0 lb

## 2012-12-27 DIAGNOSIS — G43909 Migraine, unspecified, not intractable, without status migrainosus: Secondary | ICD-10-CM

## 2012-12-27 DIAGNOSIS — Z8679 Personal history of other diseases of the circulatory system: Secondary | ICD-10-CM

## 2012-12-27 DIAGNOSIS — I4949 Other premature depolarization: Secondary | ICD-10-CM

## 2012-12-27 DIAGNOSIS — J309 Allergic rhinitis, unspecified: Secondary | ICD-10-CM

## 2012-12-27 MED ORDER — BUTALBITAL-ASA-CAFFEINE 50-325-40 MG PO CAPS: ORAL_CAPSULE | ORAL | Status: DC

## 2012-12-27 MED ORDER — ATENOLOL 25 MG PO TABS: 25.0000 mg | ORAL_TABLET | Freq: Every day | ORAL | Status: DC

## 2012-12-27 NOTE — Progress Notes (Signed)
  Subjective:    Patient ID: [name], female    DOB: 1951/01/03, 62 y.o.   MRN: [id number]  HPI  Darel Hong is a 62 year old married female nonsmoker who comes in today for general physical examination it's because of a history of migraine headaches 6  She also has a history of IBS followed in GI. She also is continuing on the post menopausal scar multiple patches but she's cut the dose back to a half a patch twice a week.  She gets routine eye care, dental care, BSE monthly, and you mammography, recent colonoscopy showed a polyp  She's also been followed by ophthalmology Dr. Hazle Quant and found to have dry eyes so she's on no medication  She takes omeprazole when necessary for reflux  She also went to orthopedics because of some hip pain. Evaluation was negative except for some arthritis  Review of Systems  Constitutional: Negative.   HENT: Negative.   Eyes: Negative.   Respiratory: Negative.   Cardiovascular: Negative.   Gastrointestinal: Negative.   Endocrine: Negative.   Genitourinary: Negative.   Musculoskeletal: Negative.   Allergic/Immunologic: Negative.   Neurological: Negative.   Hematological: Negative.   Psychiatric/Behavioral: Negative.        Objective:   Physical Exam  Nursing note and vitals reviewed. Constitutional: She appears well-developed and well-nourished.  HENT:  Head: Normocephalic and atraumatic.  Right Ear: External ear normal.  Left Ear: External ear normal.  Nose: Nose normal.  Mouth/Throat: Oropharynx is clear and moist.  Eyes: EOM are normal. Pupils are equal, round, and reactive to light.  Neck: Normal range of motion. Neck supple. No thyromegaly present.  Cardiovascular: Normal rate, regular rhythm, normal heart sounds and intact distal pulses.  Exam reveals no gallop and no friction rub.   No murmur heard. Pulmonary/Chest: Effort normal and breath sounds normal.  Abdominal: Soft. Bowel sounds are normal. She exhibits no distension and no  mass. There is no tenderness. There is no rebound.  Genitourinary:  Bilateral breast exam normal  Musculoskeletal: Normal range of motion.  Lymphadenopathy:    She has no cervical adenopathy.  Neurological: She is alert. She has normal reflexes. No cranial nerve deficit. She exhibits normal muscle tone. Coordination normal.  Skin: Skin is warm and dry.  Total body skin exam normal except for scars from previous skin cancer removal the most prominent is on the left side of her upper chest.  Psychiatric: She has a normal mood and affect. Her behavior is normal. Judgment and thought content normal.          Assessment & Plan:  Healthy female  Migraine headaches refill Fiorinal and Tenormin  Reflux esophagitis Prilosec 20 mg when necessary  Again discussed the of the post chronic long-term estrogen which increases her chance of getting breast cancer.  Dry eyes followed by ophthalmology Dr. Hazle Quant

## 2012-12-27 NOTE — Progress Notes (Signed)
Pre visit review using our clinic review tool, if applicable. No additional management support is needed unless otherwise documented below in the visit note. 

## 2012-12-27 NOTE — Patient Instructions (Signed)
Continue your good health habits and exercise  Return in one year sooner if any problems

## 2013-08-29 ENCOUNTER — Other Ambulatory Visit: Payer: Self-pay | Admitting: Orthopedic Surgery

## 2013-08-29 DIAGNOSIS — M545 Low back pain, unspecified: Secondary | ICD-10-CM

## 2013-09-01 ENCOUNTER — Other Ambulatory Visit: Payer: BC Managed Care – PPO

## 2013-09-13 ENCOUNTER — Ambulatory Visit
Admission: RE | Admit: 2013-09-13 | Discharge: 2013-09-13 | Disposition: A | Discharge status: Home / Self Care | Payer: PRIVATE HEALTH INSURANCE | Source: Ambulatory Visit | Attending: Orthopedic Surgery | Admitting: Orthopedic Surgery

## 2013-09-13 DIAGNOSIS — M545 Low back pain, unspecified: Secondary | ICD-10-CM

## 2013-09-26 ENCOUNTER — Ambulatory Visit: Payer: Medicare Other | Attending: Orthopedic Surgery | Admitting: Physical Therapy

## 2013-09-26 DIAGNOSIS — M545 Low back pain, unspecified: Secondary | ICD-10-CM | POA: Insufficient documentation

## 2013-10-03 ENCOUNTER — Ambulatory Visit: Payer: Medicare Other | Admitting: Physical Therapy

## 2013-10-03 DIAGNOSIS — M545 Low back pain, unspecified: Secondary | ICD-10-CM | POA: Diagnosis not present

## 2013-10-05 ENCOUNTER — Ambulatory Visit: Payer: Medicare Other | Admitting: Physical Therapy

## 2013-10-05 DIAGNOSIS — M545 Low back pain, unspecified: Secondary | ICD-10-CM | POA: Diagnosis not present

## 2013-10-09 ENCOUNTER — Ambulatory Visit: Payer: Medicare Other | Admitting: Physical Therapy

## 2013-10-09 DIAGNOSIS — M545 Low back pain, unspecified: Secondary | ICD-10-CM | POA: Diagnosis not present

## 2013-12-21 ENCOUNTER — Other Ambulatory Visit (INDEPENDENT_AMBULATORY_CARE_PROVIDER_SITE_OTHER): Payer: 59

## 2013-12-21 DIAGNOSIS — Z8679 Personal history of other diseases of the circulatory system: Secondary | ICD-10-CM

## 2013-12-21 DIAGNOSIS — I4949 Other premature depolarization: Secondary | ICD-10-CM

## 2013-12-21 DIAGNOSIS — Z0000 Encounter for general adult medical examination without abnormal findings: Secondary | ICD-10-CM

## 2013-12-21 LAB — BASIC METABOLIC PANEL
BUN: 16 mg/dL (ref 6–23)
CHLORIDE: 101 meq/L (ref 96–112)
CO2: 30 mEq/L (ref 19–32)
CREATININE: 0.7 mg/dL (ref 0.4–1.2)
Calcium: 9.6 mg/dL (ref 8.4–10.5)
GFR: 92.22 mL/min (ref >60.00)
Glucose, Bld: 98 mg/dL (ref 70–99)
Potassium: 3.4 mEq/L — ABNORMAL LOW (ref 3.5–5.1)
Sodium: 136 mEq/L (ref 135–145)

## 2013-12-21 LAB — CBC WITH DIFFERENTIAL/PLATELET
BASOS ABS: 0.1 10*3/uL (ref 0.0–0.1)
Basophils Relative: 0.8 % (ref 0.0–3.0)
EOS ABS: 0.1 10*3/uL (ref 0.0–0.7)
Eosinophils Relative: 1.4 % (ref 0.0–5.0)
HCT: 39 % (ref 36.0–46.0)
HEMOGLOBIN: 12.9 g/dL (ref 12.0–15.0)
LYMPHS PCT: 32.4 % (ref 12.0–46.0)
Lymphs Abs: 2.5 10*3/uL (ref 0.7–4.0)
MCHC: 33 g/dL (ref 30.0–36.0)
MCV: 95.2 fl (ref 78.0–100.0)
Monocytes Absolute: 0.6 10*3/uL (ref 0.1–1.0)
Monocytes Relative: 7.2 % (ref 3.0–12.0)
NEUTROS ABS: 4.6 10*3/uL (ref 1.4–7.7)
NEUTROS PCT: 58.2 % (ref 43.0–77.0)
Platelets: 267 10*3/uL (ref 150.0–400.0)
RBC: 4.1 Mil/uL (ref 3.87–5.11)
RDW: 12.8 % (ref 11.5–15.5)
WBC: 7.9 10*3/uL (ref 4.0–10.5)

## 2013-12-21 LAB — LIPID PANEL
CHOLESTEROL: 182 mg/dL (ref 0–200)
HDL: 51.9 mg/dL (ref >39.00)
LDL CALC: 110 mg/dL — AB (ref 0–99)
NonHDL: 130.1
TRIGLYCERIDES: 103 mg/dL (ref 0.0–149.0)
Total CHOL/HDL Ratio: 4
VLDL: 20.6 mg/dL (ref 0.0–40.0)

## 2013-12-21 LAB — POCT URINALYSIS DIPSTICK
BILIRUBIN UA: NEGATIVE
Blood, UA: NEGATIVE
GLUCOSE UA: NEGATIVE
KETONES UA: NEGATIVE
Leukocytes, UA: NEGATIVE
Nitrite, UA: NEGATIVE
Protein, UA: NEGATIVE
SPEC GRAV UA: 1.01
Urobilinogen, UA: 0.2
pH, UA: 7

## 2013-12-21 LAB — HEPATIC FUNCTION PANEL
ALK PHOS: 65 U/L (ref 39–117)
ALT: 15 U/L (ref 0–35)
AST: 20 U/L (ref 0–37)
Albumin: 3.7 g/dL (ref 3.5–5.2)
BILIRUBIN TOTAL: 0.6 mg/dL (ref 0.2–1.2)
Bilirubin, Direct: 0 mg/dL (ref 0.0–0.3)
TOTAL PROTEIN: 7.4 g/dL (ref 6.0–8.3)

## 2013-12-21 LAB — TSH: TSH: 1.33 u[IU]/mL (ref 0.35–4.50)

## 2013-12-28 ENCOUNTER — Encounter: Payer: Self-pay | Admitting: Family Medicine

## 2013-12-28 ENCOUNTER — Ambulatory Visit (INDEPENDENT_AMBULATORY_CARE_PROVIDER_SITE_OTHER): Payer: Medicare Other | Admitting: Family Medicine

## 2013-12-28 VITALS — BP 110/70 | Temp 97.9°F | Ht 61.0 in | Wt 115.0 lb

## 2013-12-28 DIAGNOSIS — K589 Irritable bowel syndrome without diarrhea: Secondary | ICD-10-CM

## 2013-12-28 DIAGNOSIS — J309 Allergic rhinitis, unspecified: Secondary | ICD-10-CM

## 2013-12-28 DIAGNOSIS — Z23 Encounter for immunization: Secondary | ICD-10-CM

## 2013-12-28 DIAGNOSIS — Z8679 Personal history of other diseases of the circulatory system: Secondary | ICD-10-CM

## 2013-12-28 DIAGNOSIS — Z8669 Personal history of other diseases of the nervous system and sense organs: Secondary | ICD-10-CM

## 2013-12-28 DIAGNOSIS — G43809 Other migraine, not intractable, without status migrainosus: Secondary | ICD-10-CM

## 2013-12-28 MED ORDER — ATENOLOL 25 MG PO TABS: 25.0000 mg | ORAL_TABLET | Freq: Every day | ORAL | Status: DC

## 2013-12-28 MED ORDER — BUTALBITAL-ASA-CAFFEINE 50-325-40 MG PO CAPS: ORAL_CAPSULE | ORAL | Status: DC

## 2013-12-28 NOTE — Patient Instructions (Signed)
Continue your current medications  Do a thorough skin exam monthly at home  Return to see me in one year sooner if any problems

## 2013-12-28 NOTE — Progress Notes (Signed)
Pre visit review using our clinic review tool, if applicable. No additional management support is needed unless otherwise documented below in the visit note. 

## 2013-12-28 NOTE — [facility] Notes (Signed)
   Subjective:    Patient ID: [name], female    DOB: Jun 07, 1950, 63 y.o.   MRN: [id number]  HPI [name] is a 63 year old married female nonsmoker who comes in today for evaluation of migraine headaches  She takes Tenormin 25 mg daily and Fiorinal 1 capsule every 4 hours. She has about 1 migraine per month.  She continues estrogen patches from her GYN.  She gets routine eye care, dental care, BSE monthly, and you mammography, colonoscopy 2014 normal  Vaccinations updated by [name]  She says she is under a lot of stress her daughter [name] and shear having a disagreement. Over the grandchildren   Review of Systems  Constitutional: Negative.   HENT: Negative.   Eyes: Negative.   Respiratory: Negative.   Cardiovascular: Negative.   Gastrointestinal: Negative.   Endocrine: Negative.   Genitourinary: Negative.   Musculoskeletal: Negative.   Skin: Negative.   Allergic/Immunologic: Negative.   Neurological: Negative.   Hematological: Negative.   Psychiatric/Behavioral: Negative.        Objective:   Physical Exam  Constitutional: She appears well-developed and well-nourished.  HENT:  Head: Normocephalic and atraumatic.  Right Ear: External ear normal.  Left Ear: External ear normal.  Nose: Nose normal.  Mouth/Throat: Oropharynx is clear and moist.  Eyes: EOM are normal. Pupils are equal, round, and reactive to light.  Neck: Normal range of motion. Neck supple. No JVD present. No tracheal deviation present. No thyromegaly present.  Cardiovascular: Normal rate, regular rhythm, normal heart sounds and intact distal pulses.  Exam reveals no gallop and no friction rub.   No murmur heard. Pulmonary/Chest: Effort normal and breath sounds normal. No stridor. No respiratory distress. She has no wheezes. She has no rales. She exhibits no tenderness.  Abdominal: Soft. Bowel sounds are normal. She exhibits no distension and no mass. There is no tenderness. There is no rebound and no  guarding.  Genitourinary:  Bilateral breast exam normal    Pelvic by GYN therefore deferred  Musculoskeletal: Normal range of motion. She exhibits no edema or tenderness.  Lymphadenopathy:    She has no cervical adenopathy.  Neurological: She is alert. She has normal reflexes. No cranial nerve deficit. She exhibits normal muscle tone. Coordination normal.  Skin: Skin is warm and dry. No rash noted. No erythema. No pallor.  Total body skin exam normal except for scar right upper anterior chest wall she had a melanoma removed from that site level 0,,,,,,,,, the register freckles and moles look okay  Psychiatric: She has a normal mood and affect. Her behavior is normal. Judgment and thought content normal.  Nursing note and vitals reviewed.         Assessment & Plan:  Healthy female  Migraine headaches continue current protocol  History of skin cancer,,,,,, melanoma level 0,,,,,,,,,,,, continue sunscreens and monthly surveillance at home yearly evaluation here every year by dermatology to,,,,

## 2014-01-26 ENCOUNTER — Encounter (HOSPITAL_BASED_OUTPATIENT_CLINIC_OR_DEPARTMENT_OTHER)
Admission: RE | Disposition: A | Discharge status: Home / Self Care | Payer: Self-pay | Source: Ambulatory Visit | Attending: Orthopedic Surgery

## 2014-01-26 ENCOUNTER — Ambulatory Visit (HOSPITAL_BASED_OUTPATIENT_CLINIC_OR_DEPARTMENT_OTHER)
Admission: RE | Admit: 2014-01-26 | Discharge: 2014-01-26 | Disposition: A | Discharge status: Home / Self Care | Payer: Medicare Other | Source: Ambulatory Visit | Attending: Orthopedic Surgery | Admitting: Orthopedic Surgery

## 2014-01-26 ENCOUNTER — Other Ambulatory Visit: Payer: Self-pay | Admitting: Orthopedic Surgery

## 2014-01-26 ENCOUNTER — Encounter (HOSPITAL_BASED_OUTPATIENT_CLINIC_OR_DEPARTMENT_OTHER): Payer: Self-pay | Admitting: *Deleted

## 2014-01-26 DIAGNOSIS — F419 Anxiety disorder, unspecified: Secondary | ICD-10-CM | POA: Diagnosis not present

## 2014-01-26 DIAGNOSIS — L03012 Cellulitis of left finger: Secondary | ICD-10-CM | POA: Diagnosis present

## 2014-01-26 DIAGNOSIS — G43909 Migraine, unspecified, not intractable, without status migrainosus: Secondary | ICD-10-CM | POA: Insufficient documentation

## 2014-01-26 DIAGNOSIS — Z85828 Personal history of other malignant neoplasm of skin: Secondary | ICD-10-CM | POA: Insufficient documentation

## 2014-01-26 DIAGNOSIS — Z7982 Long term (current) use of aspirin: Secondary | ICD-10-CM | POA: Insufficient documentation

## 2014-01-26 HISTORY — PX: I&D EXTREMITY: SHX5045

## 2014-01-26 SURGERY — MINOR IRRIGATION AND DEBRIDEMENT EXTREMITY
Anesthesia: LOCAL | Site: Finger | Laterality: Left

## 2014-01-26 MED ORDER — CHLORHEXIDINE GLUCONATE 4 % EX LIQD: 60.0000 mL | Freq: Once | CUTANEOUS | Status: DC

## 2014-01-26 MED ORDER — LIDOCAINE HCL 1 % IJ SOLN
INTRAMUSCULAR | Status: DC | PRN
  Administered 2014-01-26: 7 mL via INTRAMUSCULAR

## 2014-01-26 MED ORDER — HYDROCODONE-ACETAMINOPHEN 5-325 MG PO TABS: 1.0000 | ORAL_TABLET | Freq: Four times a day (QID) | ORAL | Status: DC | PRN

## 2014-01-26 MED ORDER — DOXYCYCLINE HYCLATE 50 MG PO CAPS: 50.0000 mg | ORAL_CAPSULE | Freq: Two times a day (BID) | ORAL | Status: DC

## 2014-01-26 SURGICAL SUPPLY — 39 items
BAG DECANTER FOR FLEXI CONT (MISCELLANEOUS) IMPLANT
BLADE MINI RND TIP GREEN BEAV (BLADE) IMPLANT
BLADE SURG 15 STRL LF DISP TIS (BLADE) ×1 IMPLANT
BLADE SURG 15 STRL SS (BLADE) ×2
BNDG CMPR 9X4 STRL LF SNTH (GAUZE/BANDAGES/DRESSINGS)
BNDG COHESIVE 1X5 TAN STRL LF (GAUZE/BANDAGES/DRESSINGS) IMPLANT
BNDG COHESIVE 3X5 TAN STRL LF (GAUZE/BANDAGES/DRESSINGS) IMPLANT
BNDG ESMARK 4X9 LF (GAUZE/BANDAGES/DRESSINGS) IMPLANT
BNDG GAUZE ELAST 4 BULKY (GAUZE/BANDAGES/DRESSINGS) IMPLANT
CHLORAPREP W/TINT 26ML (MISCELLANEOUS) ×2 IMPLANT
CORDS BIPOLAR (ELECTRODE) IMPLANT
COVER MAYO STAND STRL (DRAPES) ×2 IMPLANT
CUFF TOURNIQUET SINGLE 18IN (TOURNIQUET CUFF) IMPLANT
DRAIN PENROSE 1/2X12 LTX STRL (WOUND CARE) IMPLANT
DRAIN PENROSE 1/4X12 LTX STRL (WOUND CARE) IMPLANT
DRAPE SURG 17X23 STRL (DRAPES) ×2 IMPLANT
GAUZE PACKING IODOFORM 1/4X15 (GAUZE/BANDAGES/DRESSINGS) IMPLANT
GAUZE SPONGE 4X4 12PLY STRL (GAUZE/BANDAGES/DRESSINGS) ×2 IMPLANT
GAUZE XEROFORM 1X8 LF (GAUZE/BANDAGES/DRESSINGS) ×2 IMPLANT
GLOVE BIOGEL PI IND STRL 8 (GLOVE) ×1 IMPLANT
GLOVE BIOGEL PI INDICATOR 8 (GLOVE) ×1
GLOVE SURG ORTHO 8.0 STRL STRW (GLOVE) ×2 IMPLANT
LOOP VESSEL MAXI BLUE (MISCELLANEOUS) IMPLANT
NEEDLE PRECISIONGLIDE 27X1.5 (NEEDLE) IMPLANT
NS IRRIG 1000ML POUR BTL (IV SOLUTION) ×2 IMPLANT
PACK BASIN DAY SURGERY FS (CUSTOM PROCEDURE TRAY) ×2 IMPLANT
PAD CAST 3X4 CTTN HI CHSV (CAST SUPPLIES) IMPLANT
PADDING CAST ABS 4INX4YD NS (CAST SUPPLIES) ×1
PADDING CAST ABS COTTON 4X4 ST (CAST SUPPLIES) ×1 IMPLANT
PADDING CAST COTTON 3X4 STRL (CAST SUPPLIES)
SPLINT PLASTER CAST XFAST 3X15 (CAST SUPPLIES) IMPLANT
SPLINT PLASTER XTRA FASTSET 3X (CAST SUPPLIES)
SWAB COLLECTION DEVICE MRSA (MISCELLANEOUS) IMPLANT
SYR BULB 3OZ (MISCELLANEOUS) ×2 IMPLANT
SYR CONTROL 10ML LL (SYRINGE) IMPLANT
TOWEL OR 17X24 6PK STRL BLUE (TOWEL DISPOSABLE) ×4 IMPLANT
TUBE ANAEROBIC SPECIMEN COL (MISCELLANEOUS) IMPLANT
TUBE FEEDING 5FR 15 INCH (TUBING) IMPLANT
UNDERPAD 30X30 INCONTINENT (UNDERPADS AND DIAPERS) ×2 IMPLANT

## 2014-01-26 NOTE — Op Note (Signed)
Dictation Number [id number]

## 2014-01-26 NOTE — H&P (Signed)
[name] is a 63 year-old right-hand dominant female who calls today complaining of pain and swelling of her left index finger.  This has been going on for the past three to four days, gradually increasing.  It got particularly painful for her. She says she had her nails done a week ago with a hang nail which was pulled by the patient.  She is complaining of increasing pain and redness.  ALLERGIES:   Codeine, Combid and sulfa.  MEDICATIONS:  Atenolol and Minivelle.  SURGICAL HISTORY:  Tonsillectomy, hysterectomy, paronychias in the past.    FAMILY MEDICAL HISTORY:  Negative.  SOCIAL HISTORY:   She does not smoke, relates no alcohol.  She is married, retired.    REVIEW OF SYSTEMS:   Headaches, otherwise negative 14 points.   [name] is an 63 y.o. female.   Chief Complaint: pain swelling left index HPI: see above  Past Medical History  Diagnosis Date  . MVP (mitral valve prolapse) 1990    psa  . Rosacea   . LBP (low back pain)   . Hypokalemia   . Migraine   . Anxiety   . Diverticulosis   . DDD (degenerative disc disease)   . Melanoma   . Anal fissure   . Basal cell carcinoma   . Anemia     during pregnancy  . Ulcer     as a child    Past Surgical History  Procedure Laterality Date  . Abdominal hysterectomy  1985  . Repair knee ligament Left 1989  . Tubal ligation    . Tonsillectomy  1955  . Vein surgery    . Breast biopsy Left     secondary to infection  . Oophorectomy Left 1994  . I&d extremity Right 09/09/2012    Procedure: Minor IRRIGATION AND DEBRIDEMENT EXTREMITY paronychia of right thumb;  Surgeon: Wynonia Sours, MD;  Location: [facility];  Service: Orthopedics;  Laterality: Right;    Family History  Problem Relation Age of Onset  . Heart disease Mother   . Colon polyps Mother   . Heart disease Father   . Colitis Father   . Colon polyps Father   . Colon cancer Neg Hx   . Esophageal cancer Maternal Aunt    Social History:  reports  that she has never smoked. She has never used smokeless tobacco. She reports that she drinks about 0.5 oz of alcohol per week. She reports that she does not use illicit drugs.  Allergies:  Allergies  Allergen Reactions  . Codeine   . Compazine   . Gabapentin   . Prednisone     REACTION: makes heart beat hard \\T \ fast  . Sulfonamide Derivatives     Medications Prior to Admission  Medication Sig Dispense Refill  . Ascorbic Acid (VITAMIN C) 100 MG tablet Take 100 mg by mouth daily.      Marland Kitchen aspirin 81 MG tablet Take 81 mg by mouth. ON OCCASION    . atenolol (TENORMIN) 25 MG tablet Take 1 tablet (25 mg total) by mouth daily. 90 tablet 3  . butalbital-aspirin-caffeine (FIORINAL) 50-325-40 MG per capsule TAKE 1 CAPSULE BY MOUTH EVERY 4 HOURS AS NEEDED HEADACHE 20 capsule 1  . calcium-vitamin D 250-100 MG-UNIT per tablet Take 1 tablet by mouth daily.      . carboxymethylcellulose (REFRESH PLUS) 0.5 % SOLN 1 drop 3 (three) times daily as needed.    . doxylamine, Sleep, (UNISOM) 25 MG tablet Take 25  mg by mouth at bedtime as needed.    . hydrocortisone (ANUSOL-HC) 2.5 % rectal cream Apply rectally at bedtime, x 3 weeks 30 g 1  . Lysine 500 MG TABS Take by mouth. DAILY    . meloxicam (MOBIC) 15 MG tablet Take 15 mg by mouth daily.    Marland Kitchen MINIVELLE 0.0375 MG/24HR 1 PATCH TWICE A WEEK ON MON & tHUR    . neomycin-polymyxin-hydrocortisone (CORTISPORIN) 3.5-10000-1 ophthalmic suspension Place 2 drops into the right eye 4 (four) times daily. 7.5 mL 0  . NON FORMULARY Hydro eye    . omeprazole (PRILOSEC) 20 MG capsule Take 20 mg by mouth. AS NEEDED    . Polyethyl Glycol-Propyl Glycol (SYSTANE) 0.4-0.3 % SOLN Apply to eye.    . potassium chloride SA (K-DUR,KLOR-CON) 20 MEQ tablet Use as directed with colonoscopy prep 5 tablet 0  . Urea Nail Film 40 % SUSP     . vitamin E 100 UNIT capsule Take 100 Units by mouth daily.        No results found for this or any previous visit (from the past 48  hour(s)).  No results found.   Pertinent items are noted in HPI.  Blood pressure 121/52, pulse 81, temperature 97.5 F (36.4 C), temperature source Oral, resp. rate 16, SpO2 100 %.  General appearance: alert, cooperative and appears stated age Head: Normocephalic, without obvious abnormality Neck: no JVD Resp: clear to auscultation bilaterally Cardio: regular rate and rhythm, S1, S2 normal, no murmur, click, rub or gallop GI: soft, non-tender; bowel sounds normal; no masses,  no organomegaly Extremities: swelling left index finger Pulses: 2+ and symmetric Skin: Skin color, texture, turgor normal. No rashes or lesions Neurologic: Grossly normal Incision/Wound: na  Assessment/Plan RADIOGRAPHS:     X-rays are negative.   DIAGNOSIS:    Paronychia, left index finger.   RECOMMENDATIONS/PLAN:   We would recommend incision and drainage, removal of nail plate.  This will be scheduled as an outpatient today under local anesthesia.  She is well aware of risks and complications.   Anshi Jalloh R 01/26/2014, 2:43 PM

## 2014-01-26 NOTE — Brief Op Note (Signed)
01/26/2014  3:55 PM  PATIENT:  [name]  63 y.o. female  PRE-OPERATIVE DIAGNOSIS:  paronychia Left index finger   POST-OPERATIVE DIAGNOSIS:  paronychia Left index finger   PROCEDURE:  Procedure(s) with comments: MINOR IINCISION AND DRAINAGE paronychia left index finger (Left) - left index  SURGEON:  Surgeon(s) and Role:    * Daryll Brod, MD - Primary  PHYSICIAN ASSISTANT:   ASSISTANTS: none   ANESTHESIA:   local  EBL:     BLOOD ADMINISTERED:none  DRAINS: none   LOCAL MEDICATIONS USED:  BUPIVICAINE  and XYLOCAINE   SPECIMEN:  Source of Specimen:  culture  DISPOSITION OF SPECIMEN:  PATHOLOGY  COUNTS:  YES  TOURNIQUET:    DICTATION: .Other Dictation: Dictation Number [id number]  PLAN OF CARE: Discharge to home after PACU  PATIENT DISPOSITION:  PACU - hemodynamically stable.

## 2014-01-27 NOTE — Op Note (Signed)
[name], [name] NO.:  [id number]  MEDICAL RECORD NO.:  [id number]  LOCATION:                                 FACILITY:  PHYSICIAN:  Daryll Brod, M.D.       DATE OF BIRTH:  Jan 04, 1951  DATE OF PROCEDURE:  01/26/2014 DATE OF DISCHARGE:                              OPERATIVE REPORT   PREOPERATIVE DIAGNOSIS:  Paronychia, left index finger.  POSTOPERATIVE DIAGNOSIS:  Paronychia, left index finger.  OPERATION:  Incision and drainage, removal of nail plate paronychia, left index finger.  SURGEON:  Daryll Brod, M.D.  ANESTHESIA:  Metacarpal block.  HISTORY:  The patient is a 63 year old female with a history of paronychia of her left index finger.  This has been present for approximately 3 days despite being on Keflex.  She has had progressive pain and swelling, has fluctuance with obvious purulence under the nail matrix.  She is admitted for incision and drainage.  Pre, peri, and postoperative course have been discussed along with risks and complications.  She has been through this in the past with a thumb on right side.  DESCRIPTION OF PROCEDURE:  The patient was seen in the preoperative area.  The extremity marked by both the patient and surgeon.  Questions encouraged and answered.  She was brought to the operating room, where the finger which had been marked in the preoperative area was prepped using ChloraPrep.  A time-out taken confirming the patient and procedure.  The left index finger was then injected with a metacarpal block using 0.25% bupivacaine and 1% Xylocaine both without epinephrine. After adequate anesthesia was afforded, a Penrose drain was applied to the base of the finger.  The nail plate was removed.  Purulent material was immediately encountered under the nail plate and the nail fold. This was opened.  Cultures were taken for both aerobic and anaerobic cultures.  The wound was copiously irrigated with saline.  Then packed with a  nonadherent gauze, especially into the nail fold.  A sterile compressive dressing and splint was applied.  The tourniquet removed. The patient taken to the recovery room for observation in satisfactory condition.  She will be discharged home to return to the [facility] in 1 week on Norco and doxycycline.          ______________________________ Daryll Brod, M.D.     GK/MEDQ  D:  01/26/2014  T:  01/27/2014  Job:  [id number]

## 2014-01-29 ENCOUNTER — Encounter (HOSPITAL_BASED_OUTPATIENT_CLINIC_OR_DEPARTMENT_OTHER): Payer: Self-pay | Admitting: Orthopedic Surgery

## 2014-01-29 LAB — WOUND CULTURE

## 2014-01-31 LAB — ANAEROBIC CULTURE

## 2014-02-09 LAB — HM MAMMOGRAPHY

## 2014-02-20 ENCOUNTER — Other Ambulatory Visit: Payer: Self-pay | Admitting: Family Medicine

## 2014-03-01 ENCOUNTER — Ambulatory Visit (INDEPENDENT_AMBULATORY_CARE_PROVIDER_SITE_OTHER): Payer: Medicare Other | Admitting: Family Medicine

## 2014-03-01 VITALS — BP 118/64 | Temp 97.6°F | Wt 114.0 lb

## 2014-03-01 DIAGNOSIS — H109 Unspecified conjunctivitis: Secondary | ICD-10-CM

## 2014-03-01 DIAGNOSIS — J309 Allergic rhinitis, unspecified: Secondary | ICD-10-CM

## 2014-03-01 MED ORDER — FLUTICASONE PROPIONATE 50 MCG/ACT NA SUSP: 1.0000 | Freq: Every day | NASAL | Status: DC

## 2014-03-01 NOTE — Progress Notes (Signed)
Pre visit review using our clinic review tool, if applicable. No additional management support is needed unless otherwise documented below in the visit note. 

## 2014-03-01 NOTE — Patient Instructions (Signed)
Afrin nasal spray......Marland Kitchen 1 shot up each nostril at bedtime...Marland KitchenMarland KitchenMarland Kitchen 5 night limit  Then use these steroid nasal spray...Marland KitchenMarland KitchenMarland Kitchen 1 shot up each nostril at bedtime  OTC Clear Eyes........ one drop 4 times daily for conjunctivitis

## 2014-03-01 NOTE — [facility] Notes (Signed)
   Subjective:    Patient ID: Phillips Odor, female    DOB: 25-Jun-1950, 64 y.o.   MRN: [id number]  HPI [name] is a 64 year old married female nonsmoker who comes in today with a four-day history of nasal congestion and conjunctivitis  She has history of allergic rhinitis in the past.    Review of Systems Review of systems otherwise negative    Objective:   Physical Exam Well-developed well-nourished female no acute distress vital signs stable she's afebrile left eye normal right eye shows some conjunctivitis  Nasal exam shows septum in the midline a marked nasal edema       Assessment & Plan:  Conjunctivitis right eye...... OTC Clear Eyes 4 times a day  Nasal edema..........Marland Kitchen Afrin and steroid nasal spray

## 2014-05-31 ENCOUNTER — Encounter: Payer: Self-pay | Admitting: Physical Therapy

## 2014-05-31 ENCOUNTER — Ambulatory Visit: Payer: Medicare Other | Attending: Orthopedic Surgery | Admitting: Physical Therapy

## 2014-05-31 DIAGNOSIS — M25511 Pain in right shoulder: Secondary | ICD-10-CM

## 2014-05-31 DIAGNOSIS — M25611 Stiffness of right shoulder, not elsewhere classified: Secondary | ICD-10-CM

## 2014-05-31 NOTE — Therapy (Signed)
[facility] [address] [place], [place], [address] Phone: [phone]   Fax:  [phone]  Physical Therapy Evaluation  Patient Details  Name: [name] MRN: [id number] Date of Birth: 14-Apr-1950 Referring Provider:  Almedia Balls, MD  Encounter Date: 05/31/2014      PT End of Session - 05/31/14 1050    Visit Number 1   Date for PT Re-Evaluation 07/31/14   PT Start Time 1005   PT Stop Time 1057   PT Time Calculation (min) 52 min   Activity Tolerance Patient tolerated treatment well      Past Medical History  Diagnosis Date  . MVP (mitral valve prolapse) 1990    psa  . Rosacea   . LBP (low back pain)   . Hypokalemia   . Migraine   . Anxiety   . Diverticulosis   . DDD (degenerative disc disease)   . Melanoma   . Anal fissure   . Basal cell carcinoma   . Anemia     during pregnancy  . Ulcer     as a child    Past Surgical History  Procedure Laterality Date  . Abdominal hysterectomy  1985  . Repair knee ligament Left 1989  . Tubal ligation    . Tonsillectomy  1955  . Vein surgery    . Breast biopsy Left     secondary to infection  . Oophorectomy Left 1994  . I&d extremity Right 09/09/2012    Procedure: Minor IRRIGATION AND DEBRIDEMENT EXTREMITY paronychia of right thumb;  Surgeon: Wynonia Sours, MD;  Location: [facility];  Service: Orthopedics;  Laterality: Right;  . I&d extremity Left 01/26/2014    Procedure: MINOR IINCISION AND DRAINAGE paronychia left index finger;  Surgeon: Daryll Brod, MD;  Location: [facility];  Service: Orthopedics;  Laterality: Left;  left index    There were no vitals filed for this visit.  Visit Diagnosis:  Right shoulder pain - Plan: PT plan of care cert/re-cert  Decreased ROM of right shoulder - Plan: PT plan of care cert/re-cert      Subjective Assessment - 05/31/14 1015    Subjective Patient reports that about 8 weeks ago, no known  cause.  Since the pain started she reports that she has lost ROM.  Had a cortisone injection yesterday   Limitations Lifting;House hold activities   Diagnostic tests x-rays negative   Patient Stated Goals dress and do things without pain   Currently in Pain? Yes   Pain Score 8    Pain Location Shoulder   Pain Orientation Right   Pain Descriptors / Indicators Stabbing;Burning   Pain Type Chronic pain   Pain Onset More than a month ago   Pain Frequency Intermittent   Aggravating Factors  trying to dress and do hair, reaching   Pain Relieving Factors nothing really helps   Effect of Pain on Daily Activities limits ADL's            [facility] PT Assessment - 05/31/14 0001    Assessment   Medical Diagnosis right frozen shoulder   Onset Date 04/05/14   Prior Therapy none   Precautions   Precautions None   Balance Screen   Has the patient fallen in the past 6 months No   Has the patient had a decrease in activity level because of a fear of falling?  No   Is the patient reluctant to leave their home  because of a fear of falling?  No   Home Environment   Additional Comments does her own housework, and gardening   Prior Function   Leisure walks for exercise   Posture/Postural Control   Posture Comments fwd head and rounded sholders   ROM / Strength   AROM / PROM / Strength --  strength was 4-/5 in narrow ROM   AROM   Right Shoulder Flexion 90 Degrees   Right Shoulder ABduction 92 Degrees   Right Shoulder Internal Rotation 60 Degrees   Right Shoulder External Rotation 60 Degrees   PROM   Overall PROM Comments PROM was flexion 99, abduction 100, ER 65 and IR 60 degrees respectively   Palpation   Palpation she is very tender over the right biceps and into the deltoid area   Special Tests    Special Tests Rotator Cuff Impingement   Rotator Cuff Impingment tests Empty Can test   Neer Impingement test    Findings Positive   Empty Can test   Findings Negative                    [facility] Adult PT Treatment/Exercise - 05/31/14 0001    Modalities   Modalities Ultrasound   Ultrasound   Ultrasound Location right anterior and lateral shoulder   Ultrasound Parameters 100% 1MHz   Ultrasound Goals Pain                PT Education - 05/31/14 1049    Education provided Yes   Education Details gentle PROM of the shoulder   Person(s) Educated Patient   Methods Explanation;Demonstration;Handout   Comprehension Verbalized understanding;Returned demonstration;Verbal cues required;Tactile cues required          PT Short Term Goals - 05/31/14 1052    PT SHORT TERM GOAL #1   Title independent with initial HEP   Time 2   Period Weeks   Status New           PT Long Term Goals - 05/31/14 1053    PT LONG TERM GOAL #1   Title decrease pain 50%   Time 8   Period Weeks   Status New   PT LONG TERM GOAL #2   Title increase AROM of the right shoulder to 150 degrees flexion, and IR by 70 degrees    Time 8   Period Weeks   Status New   PT LONG TERM GOAL #3   Title Dress and do hair without pain   Time 8   Period Weeks   Status New   PT LONG TERM GOAL #4   Title play golf   Time 8   Period Weeks   Status New               Plan - 05/31/14 1050    Clinical Impression Statement Patient with right shoulder pain and diagnosis of adhesive capsulitis.  She is right handed, has significant pain and limited ROM, having difficulty dressing and doing hair.  Pain is anterior and lateral shoulder, reports that prior to the cortisone injection yesterday reports tht pain was posterior and lateral   Pt will benefit from skilled therapeutic intervention in order to improve on the following deficits Decreased range of motion;Decreased strength;Impaired flexibility;Impaired UE functional use;Pain   Rehab Potential Good   PT Frequency 2x / week   PT Duration 8 weeks   PT Treatment/Interventions Cryotherapy;Electrical  Stimulation;Ultrasound;Moist Heat;Therapeutic exercise;Therapeutic activities;Patient/family education;Manual techniques   PT Next Visit Plan  add PROM   Consulted and Agree with Plan of Care Patient          G-Codes - 2014/06/23 1055    Functional Assessment Tool Used FOTO   Functional Limitation Other PT primary   Other PT Primary Current Status (M2111) At least 60 percent but less than 80 percent impaired, limited or restricted   Other PT Primary Goal Status (B5208) At least 40 percent but less than 60 percent impaired, limited or restricted       Problem List Patient Active Problem List   Diagnosis Date Noted  . Conjunctivitis 03/01/2014  . Seborrheic keratosis, inflamed 01/05/2012  . Postmenopausal disorder 12/22/2011  . Skin cancer 07/14/2011  . Migraine 05/14/2007  . IRRITABLE BOWEL SYNDROME 05/14/2007  . History of cardiovascular disorder 05/14/2007  . Allergic rhinitis 04/07/2007  . DERMATITIS DUE TO SOLAR RADIATION 04/07/2007  . PREMATURE VENTRICULAR CONTRACTIONS, FREQUENT 09/20/2006  . DIVERTICULOSIS, COLON 04/13/2002    Sumner Boast, PT 06/23/2014, 11:01 AM  [facility] [address] [place], [place], [address] Phone: [phone]   Fax:  [phone]

## 2014-06-11 ENCOUNTER — Ambulatory Visit: Payer: Medicare Other | Attending: Orthopedic Surgery | Admitting: Physical Therapy

## 2014-06-11 ENCOUNTER — Encounter: Payer: Self-pay | Admitting: Physical Therapy

## 2014-06-11 DIAGNOSIS — M25611 Stiffness of right shoulder, not elsewhere classified: Secondary | ICD-10-CM

## 2014-06-11 DIAGNOSIS — M25511 Pain in right shoulder: Secondary | ICD-10-CM | POA: Diagnosis present

## 2014-06-11 NOTE — Therapy (Addendum)
[facility] [address] [place], [place], [address] Phone: [phone]   Fax:  [phone]  Physical Therapy Treatment  Patient Details  Name: [name] MRN: [id number] Date of Birth: 1950-06-22 Referring Provider:  Almedia Balls, MD  Encounter Date: 06/11/2014      PT End of Session - 06/11/14 1351    Visit Number 2   PT Start Time 0938   PT Stop Time 1829   PT Time Calculation (min) 51 min      Past Medical History  Diagnosis Date  . MVP (mitral valve prolapse) 1990    psa  . Rosacea   . LBP (low back pain)   . Hypokalemia   . Migraine   . Anxiety   . Diverticulosis   . DDD (degenerative disc disease)   . Melanoma   . Anal fissure   . Basal cell carcinoma   . Anemia     during pregnancy  . Ulcer     as a child    Past Surgical History  Procedure Laterality Date  . Abdominal hysterectomy  1985  . Repair knee ligament Left 1989  . Tubal ligation    . Tonsillectomy  1955  . Vein surgery    . Breast biopsy Left     secondary to infection  . Oophorectomy Left 1994  . I&d extremity Right 09/09/2012    Procedure: Minor IRRIGATION AND DEBRIDEMENT EXTREMITY paronychia of right thumb;  Surgeon: Wynonia Sours, MD;  Location: [facility];  Service: Orthopedics;  Laterality: Right;  . I&d extremity Left 01/26/2014    Procedure: MINOR IINCISION AND DRAINAGE paronychia left index finger;  Surgeon: Daryll Brod, MD;  Location: [facility];  Service: Orthopedics;  Laterality: Left;  left index    There were no vitals filed for this visit.  Visit Diagnosis:  Right shoulder pain  Decreased ROM of right shoulder      Subjective Assessment - 06/11/14 1315    Subjective I am feeling better, less pain in the front of the shoulder   Currently in Pain? Yes   Pain Score 2    Pain Location Shoulder   Pain Orientation Right   Pain Descriptors / Indicators Aching   Pain Type Chronic  pain   Pain Onset More than a month ago                         [facility] Adult PT Treatment/Exercise - 06/11/14 0001    Neck Exercises: Machines for Strengthening   UBE (Upper Arm Bike) Level 2 x 4 minutes   Cybex Row 15#   Cybex Chest Press lats 15#   Other Machines for Strengthening all wand exercises   Other Machines for Strengthening reviewed and performed HEP   Modalities   Modalities Electrical Stimulation;Moist Heat   Moist Heat Therapy   Number Minutes Moist Heat 15 Minutes   Moist Heat Location Shoulder   Electrical Stimulation   Electrical Stimulation Location right shoulder   Electrical Stimulation Parameters IFC   Electrical Stimulation Goals Pain   Manual Therapy   Manual Therapy Passive ROM;Myofascial release   Myofascial Release right upper arm   Passive ROM right shouldr all motions                  PT Short Term Goals - 05/31/14 1052    PT SHORT TERM GOAL #1   Title  independent with initial HEP   Time 2   Period Weeks   Status New           PT Long Term Goals - 05/31/14 1053    PT LONG TERM GOAL #1   Title decrease pain 50%   Time 8   Period Weeks   Status New   PT LONG TERM GOAL #2   Title increase AROM of the right shoulder to 150 degrees flexion, and IR by 70 degrees    Time 8   Period Weeks   Status New   PT LONG TERM GOAL #3   Title Dress and do hair without pain   Time 8   Period Weeks   Status New   PT LONG TERM GOAL #4   Title play golf   Time 8   Period Weeks   Status New               Plan - 06/11/14 1352    Clinical Impression Statement Much improved right shoulder AROM, still sore and tight, does not like to stretch into pain/discomfort.  Has a tender knot in the right lateral upper arm   PT Next Visit Plan may see in a week to work on advanced HEP   Consulted and Agree with Plan of Care Patient        Problem List Patient Active Problem List   Diagnosis Date Noted  . Conjunctivitis  03/01/2014  . Seborrheic keratosis, inflamed 01/05/2012  . Postmenopausal disorder 12/22/2011  . Skin cancer 07/14/2011  . Migraine 05/14/2007  . IRRITABLE BOWEL SYNDROME 05/14/2007  . History of cardiovascular disorder 05/14/2007  . Allergic rhinitis 04/07/2007  . DERMATITIS DUE TO SOLAR RADIATION 04/07/2007  . PREMATURE VENTRICULAR CONTRACTIONS, FREQUENT 09/20/2006  . DIVERTICULOSIS, COLON 04/13/2002    Sumner Boast, PT 06/11/2014, 1:54 PM  [address] [address] [place], [place], [address] Phone: [phone]   Fax:  [phone]     PHYSICAL THERAPY DISCHARGE SUMMARY  Visits from Start of Care: 2  Current functional level related to goals / functional outcomes: Good improvement in shoulder pain    Remaining deficits: Some tenderness and continued discomfort    Education / Equipment: HEP  Plan: Patient agrees to discharge.  Patient goals were partially met. Patient is being discharged due to being pleased with the current functional level.  ?????   Celyn P. Helene Kelp PT, MPH 12/26/2014 11:25 AM

## 2014-06-13 ENCOUNTER — Other Ambulatory Visit (HOSPITAL_COMMUNITY): Payer: Self-pay | Admitting: Dermatology

## 2014-07-17 ENCOUNTER — Ambulatory Visit (INDEPENDENT_AMBULATORY_CARE_PROVIDER_SITE_OTHER): Payer: Medicare Other | Admitting: Family Medicine

## 2014-07-17 ENCOUNTER — Encounter: Payer: Self-pay | Admitting: Family Medicine

## 2014-07-17 VITALS — BP 116/70 | HR 64 | Temp 97.9°F | Wt 116.0 lb

## 2014-07-17 DIAGNOSIS — R531 Weakness: Secondary | ICD-10-CM

## 2014-07-17 LAB — POCT GLUCOSE (DEVICE FOR HOME USE): GLUCOSE FASTING, POC: 103 mg/dL — AB (ref 70–99)

## 2014-07-17 NOTE — Progress Notes (Signed)
Subjective:    Patient ID: [name], female    DOB: 08/29/1950, 64 y.o.   MRN: [id number]  HPI Patient with concerns for possible "hypoglycemia". She is not diabetic and does not take any medications which should lower blood sugar. She states for years she tends to feel shaky when she goes long periods without eating. She came this morning fasting feeling somewhat shaky and blood sugar 103. Recent thyroid functions normal. No appetite or weight changes. No tachycardia. She has occasional palpitations and skipped heartbeats. She does drink a significant amount of caffeine. No recent exertional chest pains.  Past Medical History  Diagnosis Date  . MVP (mitral valve prolapse) 1990    psa  . Rosacea   . LBP (low back pain)   . Hypokalemia   . Migraine   . Anxiety   . Diverticulosis   . DDD (degenerative disc disease)   . Melanoma   . Anal fissure   . Basal cell carcinoma   . Anemia     during pregnancy  . Ulcer     as a child   Past Surgical History  Procedure Laterality Date  . Abdominal hysterectomy  1985  . Repair knee ligament Left 1989  . Tubal ligation    . Tonsillectomy  1955  . Vein surgery    . Breast biopsy Left     secondary to infection  . Oophorectomy Left 1994  . I&d extremity Right 09/09/2012    Procedure: Minor IRRIGATION AND DEBRIDEMENT EXTREMITY paronychia of right thumb;  Surgeon: Wynonia Sours, MD;  Location: [facility];  Service: Orthopedics;  Laterality: Right;  . I&d extremity Left 01/26/2014    Procedure: MINOR IINCISION AND DRAINAGE paronychia left index finger;  Surgeon: Daryll Brod, MD;  Location: [place];  Service: Orthopedics;  Laterality: Left;  left index    reports that she has never smoked. She has never used smokeless tobacco. She reports that she drinks about 0.5 oz of alcohol per week. She reports that she does not use illicit drugs. family history includes Colitis in her father; Colon polyps in her father  and mother; Esophageal cancer in her maternal aunt; Heart disease in her father and mother. There is no history of Colon cancer. Allergies  Allergen Reactions  . Codeine   . Compazine   . Gabapentin   . Prednisone     REACTION: makes heart beat hard \\T \ fast  . Sulfonamide Derivatives       Review of Systems  Constitutional: Negative for fever, chills, appetite change and unexpected weight change.  Respiratory: Negative for shortness of breath.   Cardiovascular: Negative for chest pain.  Endocrine: Negative for cold intolerance, heat intolerance, polydipsia and polyuria.  Neurological: Positive for weakness. Negative for dizziness and syncope.       Objective:   Physical Exam  Constitutional: She appears well-developed and well-nourished. No distress.  Neck: Neck supple. No thyromegaly present.  Cardiovascular: Normal rate.   Occasional skipped beat.  Pulmonary/Chest: Effort normal and breath sounds normal. No respiratory distress. She has no wheezes. She has no rales.  Musculoskeletal: She exhibits no edema.  Lymphadenopathy:    She has no cervical adenopathy.          Assessment & Plan:  Patient does with long-standing history of occasional mild weakness which she attributes to low blood sugars. She has blood sugar today of 103 with no documented hypoglycemia. We have talked about reducing high glycemic  foods and balancing carbohydrate intake with quality proteins and fats. Reassurance given. Gradually reduce caffeine intake.

## 2014-07-17 NOTE — Addendum Note (Signed)
Addended by: Noe Gens E on: 07/17/2014 10:40 AM   Modules accepted: Orders

## 2014-07-17 NOTE — Patient Instructions (Signed)

## 2014-07-17 NOTE — Progress Notes (Signed)
Pre visit review using our clinic review tool, if applicable. No additional management support is needed unless otherwise documented below in the visit note. 

## 2014-07-23 ENCOUNTER — Telehealth: Payer: Self-pay

## 2014-07-23 NOTE — Telephone Encounter (Signed)
Pt states she had mammorgam Jan 2016 at [facility] (Dr Melinda Crutch) I asked she to have results faxed to [place].

## 2014-09-05 ENCOUNTER — Encounter: Payer: Self-pay | Admitting: Cardiology

## 2014-10-12 ENCOUNTER — Other Ambulatory Visit: Payer: Self-pay | Admitting: Family Medicine

## 2014-10-12 DIAGNOSIS — L659 Nonscarring hair loss, unspecified: Secondary | ICD-10-CM

## 2014-10-17 ENCOUNTER — Other Ambulatory Visit (INDEPENDENT_AMBULATORY_CARE_PROVIDER_SITE_OTHER): Payer: Medicare Other

## 2014-10-17 DIAGNOSIS — L659 Nonscarring hair loss, unspecified: Secondary | ICD-10-CM | POA: Diagnosis not present

## 2014-10-17 LAB — TSH: TSH: 1.29 u[IU]/mL (ref 0.35–4.50)

## 2014-10-22 ENCOUNTER — Telehealth: Payer: Self-pay | Admitting: Family Medicine

## 2014-10-22 NOTE — Telephone Encounter (Signed)
Pt would like results of labs. Please call back. °

## 2014-10-23 NOTE — Telephone Encounter (Signed)
Patient is aware of lab results.

## 2014-12-06 ENCOUNTER — Ambulatory Visit (INDEPENDENT_AMBULATORY_CARE_PROVIDER_SITE_OTHER): Payer: Medicare Other

## 2014-12-06 ENCOUNTER — Ambulatory Visit: Payer: Medicare Other

## 2014-12-06 DIAGNOSIS — Z23 Encounter for immunization: Secondary | ICD-10-CM | POA: Diagnosis not present

## 2014-12-31 ENCOUNTER — Other Ambulatory Visit (INDEPENDENT_AMBULATORY_CARE_PROVIDER_SITE_OTHER): Payer: Medicare Other

## 2014-12-31 DIAGNOSIS — Z0000 Encounter for general adult medical examination without abnormal findings: Secondary | ICD-10-CM

## 2014-12-31 LAB — HEPATIC FUNCTION PANEL
ALK PHOS: 64 U/L (ref 39–117)
ALT: 11 U/L (ref 0–35)
AST: 15 U/L (ref 0–37)
Albumin: 4.1 g/dL (ref 3.5–5.2)
BILIRUBIN TOTAL: 0.5 mg/dL (ref 0.2–1.2)
Bilirubin, Direct: 0.1 mg/dL (ref 0.0–0.3)
Total Protein: 7.1 g/dL (ref 6.0–8.3)

## 2014-12-31 LAB — BASIC METABOLIC PANEL
BUN: 13 mg/dL (ref 6–23)
CO2: 29 mEq/L (ref 19–32)
Calcium: 9.8 mg/dL (ref 8.4–10.5)
Chloride: 103 mEq/L (ref 96–112)
Creatinine, Ser: 0.71 mg/dL (ref 0.40–1.20)
GFR: 87.46 mL/min (ref >60.00)
Glucose, Bld: 97 mg/dL (ref 70–99)
POTASSIUM: 4.5 meq/L (ref 3.5–5.1)
Sodium: 139 mEq/L (ref 135–145)

## 2014-12-31 LAB — POCT URINALYSIS DIPSTICK
Bilirubin, UA: NEGATIVE
Glucose, UA: NEGATIVE
Ketones, UA: NEGATIVE
NITRITE UA: NEGATIVE
Protein, UA: NEGATIVE
Spec Grav, UA: 1.02
Urobilinogen, UA: 0.2
pH, UA: 6.5

## 2014-12-31 LAB — LIPID PANEL
CHOL/HDL RATIO: 3
Cholesterol: 171 mg/dL (ref 0–200)
HDL: 59 mg/dL (ref >39.00)
LDL CALC: 89 mg/dL (ref 0–99)
NONHDL: 111.51
Triglycerides: 113 mg/dL (ref 0.0–149.0)
VLDL: 22.6 mg/dL (ref 0.0–40.0)

## 2014-12-31 LAB — TSH: TSH: 1.39 u[IU]/mL (ref 0.35–4.50)

## 2015-01-01 LAB — CBC WITH DIFFERENTIAL/PLATELET
Basophils Absolute: 0 10*3/uL (ref 0.0–0.1)
Basophils Relative: 0.2 % (ref 0.0–3.0)
EOS PCT: 2 % (ref 0.0–5.0)
Eosinophils Absolute: 0.1 10*3/uL (ref 0.0–0.7)
HCT: 37.6 % (ref 36.0–46.0)
HEMOGLOBIN: 12.4 g/dL (ref 12.0–15.0)
Lymphocytes Relative: 29.8 % (ref 12.0–46.0)
Lymphs Abs: 2.2 10*3/uL (ref 0.7–4.0)
MCHC: 33 g/dL (ref 30.0–36.0)
MCV: 94.5 fl (ref 78.0–100.0)
MONO ABS: 0.4 10*3/uL (ref 0.1–1.0)
MONOS PCT: 4.7 % (ref 3.0–12.0)
NEUTROS PCT: 63.3 % (ref 43.0–77.0)
Neutro Abs: 4.7 10*3/uL (ref 1.4–7.7)
Platelets: 248 10*3/uL (ref 150.0–400.0)
RBC: 3.98 Mil/uL (ref 3.87–5.11)
RDW: 13.4 % (ref 11.5–15.5)
WBC: 7.5 10*3/uL (ref 4.0–10.5)

## 2015-01-01 LAB — HEPATITIS C ANTIBODY: HCV Ab: NEGATIVE

## 2015-01-08 ENCOUNTER — Encounter: Payer: Self-pay | Admitting: Family Medicine

## 2015-01-08 ENCOUNTER — Ambulatory Visit (INDEPENDENT_AMBULATORY_CARE_PROVIDER_SITE_OTHER): Payer: Medicare Other | Admitting: Family Medicine

## 2015-01-08 VITALS — BP 110/60 | HR 65 | Temp 97.6°F | Ht 61.02 in | Wt 115.0 lb

## 2015-01-08 DIAGNOSIS — I4949 Other premature depolarization: Secondary | ICD-10-CM | POA: Diagnosis not present

## 2015-01-08 DIAGNOSIS — C4490 Unspecified malignant neoplasm of skin, unspecified: Secondary | ICD-10-CM

## 2015-01-08 DIAGNOSIS — Z8679 Personal history of other diseases of the circulatory system: Secondary | ICD-10-CM | POA: Diagnosis not present

## 2015-01-08 DIAGNOSIS — M8580 Other specified disorders of bone density and structure, unspecified site: Secondary | ICD-10-CM

## 2015-01-08 DIAGNOSIS — Z23 Encounter for immunization: Secondary | ICD-10-CM | POA: Diagnosis not present

## 2015-01-08 DIAGNOSIS — Z8669 Personal history of other diseases of the nervous system and sense organs: Secondary | ICD-10-CM

## 2015-01-08 MED ORDER — ATENOLOL 25 MG PO TABS: 25.0000 mg | ORAL_TABLET | Freq: Every day | ORAL | Status: DC

## 2015-01-08 MED ORDER — BUTALBITAL-ASA-CAFFEINE 50-325-40 MG PO CAPS: ORAL_CAPSULE | ORAL | Status: DC

## 2015-01-08 NOTE — Progress Notes (Signed)
Subjective:    Patient ID: [name], female    DOB: Jun 30, 1950, 64 y.o.   MRN: [id number]  HPI [name] is a 64 year old married female nonsmoker who comes in today for general physical examination because of a history of skin cancer... She had a melanoma in her chest 5 years ago.. Cardiac arrhythmia palpitations control with Tenormin 25 mg daily  She also has allergic rhinitis for uses steroid nasal spray. She has reflux esophagitis occasionally and takes Prilosec when necessary  She sees her GYN on a regular basis. She is on a hormonal patch. Advised to take an aspirin tablet daily. She gets routine eye care, dental care, BSE monthly, annual mammography, colonoscopy 2014 normal  She tells me she had a bone density about 8 years ago showed some osteopenia.  Vaccinations she was given the Pneumovax 23.  Cognitive function normal she walks on a regular basis home self safety reviewed no issues identified, no guns in the house, she does not have a healthcare power of attorney nor living well...Marland KitchenMarland KitchenMarland Kitchen encouraged to do that   Review of Systems  Constitutional: Negative.   HENT: Negative.   Eyes: Negative.   Respiratory: Negative.   Cardiovascular: Negative.   Gastrointestinal: Negative.   Endocrine: Negative.   Genitourinary: Negative.   Musculoskeletal: Negative.   Skin: Negative.   Allergic/Immunologic: Negative.   Neurological: Negative.   Hematological: Negative.   Psychiatric/Behavioral: Negative.        Objective:   Physical Exam  Constitutional: She appears well-developed and well-nourished.  HENT:  Head: Normocephalic and atraumatic.  Right Ear: External ear normal.  Left Ear: External ear normal.  Nose: Nose normal.  Mouth/Throat: Oropharynx is clear and moist.  Eyes: EOM are normal. Pupils are equal, round, and reactive to light.  Neck: Normal range of motion. Neck supple. No JVD present. No tracheal deviation present. No thyromegaly present.  Cardiovascular:  Normal rate, regular rhythm, normal heart sounds and intact distal pulses.  Exam reveals no gallop and no friction rub.   No murmur heard. Pulmonary/Chest: Effort normal and breath sounds normal. No stridor. No respiratory distress. She has no wheezes. She has no rales. She exhibits no tenderness.  Abdominal: Soft. Bowel sounds are normal. She exhibits no distension and no mass. There is no tenderness. There is no rebound and no guarding.  Genitourinary:  Bilateral breast exam normal  Musculoskeletal: Normal range of motion.  Lymphadenopathy:    She has no cervical adenopathy.  Neurological: She is alert. She has normal reflexes. No cranial nerve deficit. She exhibits normal muscle tone. Coordination normal.  Skin: Skin is warm and dry. No rash noted. No erythema. No pallor.  Total body skin exam with a bright halogen light shows no evidence of any appreciable abnormalities. She has a scar in the upper left chest wall anterior from previous melanoma removal. She has a garden variety of freckles mole seborrheic keratosis and capillary hemangiomas.  She has a seborrheic keratosis on her face which would like removed. I referred her to dermatology  Psychiatric: She has a normal mood and affect. Her behavior is normal. Judgment and thought content normal.  Nursing note and vitals reviewed.         Assessment & Plan:  Healthy female  History of palpitations......... continue Inderal 25 mg daily  Allergic rhinitis........ antihistamine and steroid nasal spray  Postmenopausal HRT view GYN.......Marland Kitchen recommend daily aspirin because of the hands of blood clots with estrogen.  Migraine headaches under fair control refill  Fiorinal.

## 2015-01-08 NOTE — Progress Notes (Signed)
Pre visit review using our clinic review tool, if applicable. No additional management support is needed unless otherwise documented below in the visit note. 

## 2015-01-08 NOTE — Patient Instructions (Signed)
Continue your good health habits diet and exercise  Return in one year sooner if any problems  [name]'s extension is [phone]  We'll also set you up for a bone density

## 2015-01-09 ENCOUNTER — Telehealth: Payer: Self-pay | Admitting: Family Medicine

## 2015-01-09 NOTE — Telephone Encounter (Signed)
Pt has allergic reaction to PCN injection. Pt was seen yesterday. Pt arm is swollen a little red. Pt advise

## 2015-01-09 NOTE — Telephone Encounter (Signed)
Spoke with patient.  She is taking benadryl and it is improving.  An appointment was made, but will call back and cancel if not needed.

## 2015-01-10 ENCOUNTER — Ambulatory Visit (INDEPENDENT_AMBULATORY_CARE_PROVIDER_SITE_OTHER): Payer: Medicare Other | Admitting: Family Medicine

## 2015-01-10 ENCOUNTER — Encounter: Payer: Self-pay | Admitting: Family Medicine

## 2015-01-10 ENCOUNTER — Ambulatory Visit: Payer: Medicare Other | Admitting: Adult Health

## 2015-01-10 VITALS — BP 100/72 | HR 62 | Temp 97.6°F | Ht 61.02 in

## 2015-01-10 DIAGNOSIS — L089 Local infection of the skin and subcutaneous tissue, unspecified: Secondary | ICD-10-CM | POA: Diagnosis not present

## 2015-01-10 DIAGNOSIS — T50Z95A Adverse effect of other vaccines and biological substances, initial encounter: Secondary | ICD-10-CM | POA: Diagnosis not present

## 2015-01-10 NOTE — Progress Notes (Signed)
HPI:  Acute visit for:  Possible reaction to pneumonia vaccine: -redness and soreness at vaccine site after pneumoccocal vaccine 01/08/15 -was swollen as well - [facility] -much better now, but still with some redness and soreness -denies: fevers, chills, malaise, SOB, swelling elsewhere, facial swelling  -she is finishing up course of keflex for a toe infection  ROS: See pertinent positives and negatives per HPI.  Past Medical History  Diagnosis Date  . MVP (mitral valve prolapse) 1990    psa  . Rosacea   . LBP (low back pain)   . Hypokalemia   . Migraine   . Anxiety   . Diverticulosis   . DDD (degenerative disc disease)   . Melanoma ([place])   . Anal fissure   . Basal cell carcinoma   . Anemia     during pregnancy  . Ulcer     as a child    Past Surgical History  Procedure Laterality Date  . Abdominal hysterectomy  1985  . Repair knee ligament Left 1989  . Tubal ligation    . Tonsillectomy  1955  . Vein surgery    . Breast biopsy Left     secondary to infection  . Oophorectomy Left 1994  . I&d extremity Right 09/09/2012    Procedure: Minor IRRIGATION AND DEBRIDEMENT EXTREMITY paronychia of right thumb;  Surgeon: Wynonia Sours, MD;  Location: [place];  Service: Orthopedics;  Laterality: Right;  . I&d extremity Left 01/26/2014    Procedure: MINOR IINCISION AND DRAINAGE paronychia left index finger;  Surgeon: Daryll Brod, MD;  Location: [place];  Service: Orthopedics;  Laterality: Left;  left index    Family History  Problem Relation Age of Onset  . Heart disease Mother   . Colon polyps Mother   . Heart disease Father   . Colitis Father   . Colon polyps Father   . Colon cancer Neg Hx   . Esophageal cancer Maternal Aunt     Social History   Social History  . Marital Status: Married    Spouse Name: N/A  . Number of Children: 2  . Years of Education: N/A   Occupational History  . retired  school system    Social History Main Topics  . Smoking status: Never Smoker   . Smokeless tobacco: Never Used  . Alcohol Use: 0.5 oz/week    1 drink(s) per week     Comment: social wine  . Drug Use: No  . Sexual Activity: Not Asked   Other Topics Concern  . None   Social History Narrative     Current outpatient prescriptions:  .  Ascorbic Acid (VITAMIN C) 100 MG tablet, Take 100 mg by mouth daily.  , Disp: , Rfl:  .  aspirin 81 MG tablet, Take 81 mg by mouth. ON OCCASION, Disp: , Rfl:  .  atenolol (TENORMIN) 25 MG tablet, Take 1 tablet (25 mg total) by mouth daily., Disp: 90 tablet, Rfl: 3 .  BIOTIN PO, Take by mouth., Disp: , Rfl:  .  butalbital-aspirin-caffeine (FIORINAL) 50-325-40 MG capsule, TAKE 1 CAPSULE BY MOUTH EVERY 4 HOURS AS NEEDED HEADACHE, Disp: 20 capsule, Rfl: 1 .  calcium-vitamin D 250-100 MG-UNIT per tablet, Take 1 tablet by mouth daily.  , Disp: , Rfl:  .  carboxymethylcellulose (REFRESH PLUS) 0.5 % SOLN, 1 drop 3 (three) times daily as needed., Disp: , Rfl:  .  cephALEXin (KEFLEX) 500 MG capsule,  TAKE 1 CAPSULE (500 MG TOTAL) EVERY 6 HOURS FOR 10 DAYS., Disp: , Rfl: 0 .  doxylamine, Sleep, (UNISOM) 25 MG tablet, Take 25 mg by mouth at bedtime as needed., Disp: , Rfl:  .  fluticasone (FLONASE) 50 MCG/ACT nasal spray, Place 1 spray into both nostrils daily., Disp: 16 g, Rfl: 6 .  hydrocortisone (ANUSOL-HC) 2.5 % rectal cream, Apply rectally at bedtime, x 3 weeks, Disp: 30 g, Rfl: 1 .  loteprednol (LOTEMAX) 0.2 % SUSP, 1 drop 4 (four) times daily., Disp: , Rfl:  .  MINIVELLE 0.0375 MG/24HR, 1 PATCH TWICE A WEEK ON MON & tHUR, Disp: , Rfl:  .  NON FORMULARY, Hydro eye, Disp: , Rfl:  .  omeprazole (PRILOSEC) 20 MG capsule, Take 20 mg by mouth. AS NEEDED, Disp: , Rfl:  .  Polyethyl Glycol-Propyl Glycol (SYSTANE) 0.4-0.3 % SOLN, Apply to eye., Disp: , Rfl:  .  Urea Nail Film 40 % SUSP, , Disp: , Rfl:  .  vitamin E 100 UNIT capsule, Take 100 Units by mouth daily.  ,  Disp: , Rfl:   EXAM:  Filed Vitals:   01/10/15 1339  BP: 100/72  Pulse: 62  Temp: 97.6 F (36.4 C)    There is no weight on file to calculate BMI.  GENERAL: vitals reviewed and listed above, alert, oriented, appears well hydrated and in no acute distress  HEENT: atraumatic, conjunttiva clear, no obvious abnormalities on inspection of external nose and ears  NECK: no obvious masses on inspection  LUNGS: clear to auscultation bilaterally, no wheezes, rales or rhonchi, good air movement  CV: HRRR, no peripheral edema  SKIN: patch of mild erythema L UE upper arm without induration, edema or ulceration  MS: moves all extremities without noticeable abnormality  PSYCH: pleasant and cooperative, no obvious depression or anxiety  ASSESSMENT AND PLAN:  Discussed the following assessment and plan:  Vaccine reaction, initial encounter  Skin infection  -likely vaccine reaction given improving - not likely infection, but she is on keflex for other infection -return precuations -Patient advised to return or notify a doctor immediately if symptoms worsen or persist or new concerns arise.  Patient Instructions  Continue benadryl at night OR pepcid  Ice and tylenol or ibuprofen for pain if needed  Can continue keflex for 3 more days  Follow up if worsening, fevers, malaise, symptoms persist longer then expected or other concerns     [name] R.

## 2015-01-10 NOTE — Progress Notes (Signed)
Pre visit review using our clinic review tool, if applicable. No additional management support is needed unless otherwise documented below in the visit note. Patient declines weight measurement today. 

## 2015-01-10 NOTE — Patient Instructions (Signed)
Continue benadryl at night OR pepcid  Ice and tylenol or ibuprofen for pain if needed  Can continue keflex for 3 more days  Follow up if worsening, fevers, malaise, symptoms persist longer then expected or other concerns

## 2015-01-14 ENCOUNTER — Encounter: Payer: Self-pay | Admitting: Internal Medicine

## 2015-02-21 ENCOUNTER — Encounter: Payer: Self-pay | Admitting: *Deleted

## 2015-03-04 ENCOUNTER — Other Ambulatory Visit: Payer: Medicare Other

## 2015-03-08 ENCOUNTER — Ambulatory Visit (INDEPENDENT_AMBULATORY_CARE_PROVIDER_SITE_OTHER)
Admission: RE | Admit: 2015-03-08 | Discharge: 2015-03-08 | Disposition: A | Discharge status: Home / Self Care | Payer: Medicare Other | Source: Ambulatory Visit | Attending: Family Medicine | Admitting: Family Medicine

## 2015-03-08 DIAGNOSIS — M8580 Other specified disorders of bone density and structure, unspecified site: Secondary | ICD-10-CM

## 2015-03-21 ENCOUNTER — Telehealth: Payer: Self-pay | Admitting: Family Medicine

## 2015-03-21 NOTE — Telephone Encounter (Signed)
Patient would like the results of her recent Bone Density test.

## 2015-03-22 NOTE — Telephone Encounter (Signed)
Patient is aware 

## 2015-08-15 ENCOUNTER — Other Ambulatory Visit: Payer: Self-pay | Admitting: Family Medicine

## 2015-09-24 ENCOUNTER — Encounter: Payer: Self-pay | Admitting: Family Medicine

## 2015-09-24 ENCOUNTER — Telehealth: Payer: Self-pay | Admitting: Family Medicine

## 2015-09-24 ENCOUNTER — Ambulatory Visit (INDEPENDENT_AMBULATORY_CARE_PROVIDER_SITE_OTHER)
Admission: RE | Admit: 2015-09-24 | Discharge: 2015-09-24 | Disposition: A | Discharge status: Home / Self Care | Payer: Medicare Other | Source: Ambulatory Visit | Attending: Family Medicine | Admitting: Family Medicine

## 2015-09-24 ENCOUNTER — Ambulatory Visit (INDEPENDENT_AMBULATORY_CARE_PROVIDER_SITE_OTHER): Payer: Medicare Other | Admitting: Family Medicine

## 2015-09-24 VITALS — BP 130/78 | HR 72 | Temp 97.7°F | Resp 16 | Ht 61.02 in | Wt 115.2 lb

## 2015-09-24 DIAGNOSIS — K219 Gastro-esophageal reflux disease without esophagitis: Secondary | ICD-10-CM

## 2015-09-24 DIAGNOSIS — J069 Acute upper respiratory infection, unspecified: Secondary | ICD-10-CM | POA: Diagnosis not present

## 2015-09-24 DIAGNOSIS — R059 Cough, unspecified: Secondary | ICD-10-CM

## 2015-09-24 DIAGNOSIS — R05 Cough: Secondary | ICD-10-CM

## 2015-09-24 MED ORDER — BENZONATATE 100 MG PO CAPS: 200.0000 mg | ORAL_CAPSULE | Freq: Two times a day (BID) | ORAL | 0 refills | Status: AC | PRN

## 2015-09-24 NOTE — Progress Notes (Signed)
Pre visit review using our clinic review tool, if applicable. No additional management support is needed unless otherwise documented below in the visit note. 

## 2015-09-24 NOTE — Telephone Encounter (Signed)
Pt calling to see if Xray result had been received and would to have results please.

## 2015-09-24 NOTE — Telephone Encounter (Signed)
Called and gave patient the results based off of Dr. Doug Sou patient email, pt doesn't use mychart. Patient verbalized understanding & will make an appointment to follow up with Dr. Sherren Mocha in September.

## 2015-09-24 NOTE — Patient Instructions (Addendum)
  [name] I have seen you today for an acute visit because your primary care provider was not available. Monitor for signs of worsening symptoms and seek immediate medical attention if any concerning/warning symptom as we discussed. If symptoms are not resolved in 1-2 weeks you should schedule a follow up appointment with your doctor, before if symptoms get worse.  A few things to remember from today's visit:   Cough - Plan: DG Chest 2 View, benzonatate (TESSALON) 100 MG capsule  URI, acute  Gastroesophageal reflux disease without esophagitis   Current illness seems to be a vital infection.  Viral infections are self-limited and we treat each symptom depending of severity.  Over the counter medications as decongestants and cold medications usually help, they need to be taken with caution if there is a history of high blood pressure or palpitations. Tylenol and/or Ibuprofen also helps with most symptoms (headache, muscle aching, fever,etc) Plenty of fluids. Honey helps with cough. Steam inhalations helps with runny nose, nasal congestion, and may prevent sinus infections. Cough and nasal congestion could last a few days and sometimes weeks. Please follow in not any better in 1-2 weeks or if symptoms get worse.  Cough can exacerbate acid reflux, so resume omeprazole 20 mg daily and take it for 2-3 weeks. Avoid food intake within 3 hours before going to bed.  Antibiotic can cause diarrhea, so a daily probiotic recommended.  Please be sure medication list is accurate. If a new problem present, please set up appointment sooner than planned today.

## 2015-09-24 NOTE — Progress Notes (Addendum)
HPI:  ACUTE VISIT:  Chief Complaint  Patient presents with  . Cough    [facility]. Patient was coughing up green mucous, now it is clear. patient had a coughing spell last night and couldn't catch her breath    [name] is a 65 y.o. female, who is here today with husband complaining of 4 days of respiratory symptoms.  2 days ago she was evaluated at the [facility] and prescribed Augmentin, has taken 5 tabs total. It is causing mild diarrhea.  Productive cough, worse at night, small amount of greenish sputum, causes chest wall pain. She would like a CXR done.  + fever, fatigue, and muscle aches. Last time she had fever about 2 days ago, 68 F.  No chill or arthralgias. + Nasal congestion, rhinorrhea, sore throat, and post nasal drainage.  Denies exertional chest pain, dyspnea, or wheezing.   No Hx of recent travel. Sick contact: grandson Dx with strep about 13 days ago.  . No known insect bite. No Hx of allergies.   Symptoms otherwise stable.  Hx of GERD, no longer on Omeprazole, acid reflux at night aggravated by cough spells. Mild nausea this morning, resolved. No vomiting or abdominal pain.    Review of Systems  Constitutional: Positive for fever. Negative for appetite change, chills, fatigue and unexpected weight change.  HENT: Positive for congestion, postnasal drip, rhinorrhea, sneezing and sore throat. Negative for ear pain, mouth sores, sinus pressure, trouble swallowing and voice change.   Eyes: Negative for discharge, redness and itching.  Respiratory: Positive for cough. Negative for shortness of breath, wheezing and stridor.   Cardiovascular: Positive for chest pain (with cough, no exertional CP). Negative for palpitations and leg swelling.  Gastrointestinal: Positive for diarrhea. Negative for abdominal pain, blood in stool, nausea and vomiting.       Acid reflux at night aggravated by cough   Musculoskeletal: Negative for back pain, myalgias and neck pain.  Skin: Negative for color change and rash.  Allergic/Immunologic: Negative for environmental allergies.  Neurological: Negative for syncope, weakness, numbness and headaches.  Hematological: Negative for adenopathy. Does not bruise/bleed easily.  Psychiatric/Behavioral: Negative for confusion. The patient is nervous/anxious.       Current Outpatient Prescriptions on File Prior to Visit  Medication Sig Dispense Refill  . Ascorbic Acid (VITAMIN C) 100 MG tablet Take 100 mg by mouth daily.      Marland Kitchen aspirin 81 MG tablet Take 81 mg by mouth. ON OCCASION    . atenolol (TENORMIN) 25 MG tablet Take 1 tablet (25 mg total) by mouth daily. 90 tablet 3  . BIOTIN PO Take by mouth.    . butalbital-aspirin-caffeine (FIORINAL) 50-325-40 MG capsule TAKE 1 CAPSULE BY MOUTH EVERY 4 HOURS AS NEEDED FOR HEADACHE 20 capsule 0  . calcium-vitamin D 250-100 MG-UNIT per tablet Take 1 tablet by mouth daily.      Marland Kitchen doxylamine, Sleep, (UNISOM) 25 MG tablet Take 25 mg by mouth at bedtime as needed.    . hydrocortisone (ANUSOL-HC) 2.5 % rectal cream Apply rectally at bedtime, x 3 weeks 30 g 1  . loteprednol (LOTEMAX) 0.2 % SUSP 1 drop 4 (four) times daily.    . NON FORMULARY Hydro eye    . omeprazole (PRILOSEC) 20 MG capsule Take 20 mg by mouth. AS NEEDED    . Polyethyl Glycol-Propyl Glycol (SYSTANE) 0.4-0.3 % SOLN Apply to eye.    Marland Kitchen  vitamin E 100 UNIT capsule Take 100 Units by mouth daily.       No current facility-administered medications on file prior to visit.      Past Medical History:  Diagnosis Date  . Anal fissure   . Anemia    during pregnancy  . Anxiety   . Basal cell carcinoma   . DDD (degenerative disc disease)   . Diverticulosis   . Hypokalemia   . LBP (low back pain)   . Melanoma ([place])   . Migraine   . MVP (mitral valve prolapse) 1990   psa  . Rosacea   . Ulcer    as a child   Allergies  Allergen Reactions  . Codeine     . Compazine   . Gabapentin   . Prednisone     REACTION: makes heart beat hard \\T \ fast  . Sulfonamide Derivatives     Social History   Social History  . Marital status: Married    Spouse name: N/A  . Number of children: 2  . Years of education: N/A   Occupational History  . retired school system    Social History Main Topics  . Smoking status: Never Smoker  . Smokeless tobacco: Never Used  . Alcohol use 0.5 oz/week    1 drink(s) per week     Comment: social wine  . Drug use: No  . Sexual activity: Not Asked   Other Topics Concern  . None   Social History Narrative  . None    Vitals:   09/24/15 0829  BP: 130/78  Pulse: 72  Resp: 16  Temp: 97.7 F (36.5 C)   Body mass index is 21.76 kg/m.   O2 at RA 98%.   Physical Exam  Constitutional: She is oriented to person, place, and time. She appears well-developed and well-nourished. She does not appear ill. No distress.  HENT:  Head: Atraumatic.  Right Ear: Hearing, tympanic membrane, external ear and ear canal normal.  Left Ear: Hearing, tympanic membrane, external ear and ear canal normal.  Nose: Rhinorrhea and septal deviation present. Right sinus exhibits no maxillary sinus tenderness and no frontal sinus tenderness. Left sinus exhibits no maxillary sinus tenderness and no frontal sinus tenderness.  Mouth/Throat: Uvula is midline, oropharynx is clear and moist and mucous membranes are normal.  Nasal voice. Hypertrophic turbinates. Mild postnasal drainage.  Eyes: Conjunctivae and EOM are normal.  Neck: No muscular tenderness present. No edema and no erythema present.  Cardiovascular: Normal rate and regular rhythm.   Respiratory: Effort normal and breath sounds normal. No respiratory distress.  Lymphadenopathy:       Head (right side): No submandibular adenopathy present.       Head (left side): No submandibular adenopathy present.    She has no cervical adenopathy.  Neurological: She is alert and  oriented to person, place, and time.  Skin: Skin is warm. No rash noted. No erythema.  Psychiatric: Her speech is normal. Her mood appears anxious.  Well groomed, good eye contact.      ASSESSMENT AND PLAN:     Kylie was seen today for cough.  Diagnoses and all orders for this visit:  URI, acute  Symptoms suggests a viral etiology, I explained patient that symptomatic treatment is usually recommended in this case. Some side effects of antibiotics discussed, diarrhea and nausea could be related with abx; recommended taking a daily probiotic.  Instructed to monitor for signs of complications, including new onset of fever among some,  clearly instructed about warning signs. I also explained that cough and nasal congestion can last a few days and sometimes weeks. F/U as needed.   Cough  Explained that cough can last a few weeks, lung auscultation today negative. CXR ordered.  -     DG Chest 2 View; Future -     benzonatate (TESSALON) 100 MG capsule; Take 2 capsules (200 mg total) by mouth 2 (two) times daily as needed for cough.   Gastroesophageal reflux disease without esophagitis  Explained that GERD can be aggravated by cough and could cause persistent cough. Resume Omeprazole 40 mg and take it for 2-3 weeks. GERD precautions. Follow-up as needed.       -[name] advised to return or notify a doctor immediately if symptoms worsen or persist or new concerns arise.       Averill Winters G. [place], MD  [facility]. [place] office.

## 2015-11-18 ENCOUNTER — Ambulatory Visit (INDEPENDENT_AMBULATORY_CARE_PROVIDER_SITE_OTHER): Payer: Medicare Other | Admitting: Family Medicine

## 2015-11-18 ENCOUNTER — Encounter: Payer: Self-pay | Admitting: Family Medicine

## 2015-11-18 DIAGNOSIS — Z23 Encounter for immunization: Secondary | ICD-10-CM

## 2015-11-18 DIAGNOSIS — R938 Abnormal findings on diagnostic imaging of other specified body structures: Secondary | ICD-10-CM

## 2015-11-18 DIAGNOSIS — R9389 Abnormal findings on diagnostic imaging of other specified body structures: Secondary | ICD-10-CM | POA: Insufficient documentation

## 2015-11-18 NOTE — Progress Notes (Signed)
[name] is a 65 year old married female nonsmoker comes in today for evaluation of an abnormal chest x-ray  She was the beach this summer and had a cold and a cough. She returned had a chest x-ray which showed no pneumonia however there is a nodular density adjacent to the right heart border. Reviewing her chest x-ray from 2012 it's unchanged. Pulmonary-wise she is asymptomatic. I do not think she needs any follow-up x-rays  Physical examination vital signs stable she's afebrile examination of the lungs is normal  Impression #1 nodular density right middle lobe unchanged from many years observe

## 2015-11-18 NOTE — Patient Instructions (Signed)
Return when necessary 

## 2015-11-19 ENCOUNTER — Other Ambulatory Visit: Payer: Self-pay | Admitting: Emergency Medicine

## 2015-11-19 MED ORDER — ATENOLOL 25 MG PO TABS: 25.0000 mg | ORAL_TABLET | Freq: Every day | ORAL | 2 refills | Status: DC

## 2016-01-30 ENCOUNTER — Other Ambulatory Visit (INDEPENDENT_AMBULATORY_CARE_PROVIDER_SITE_OTHER): Payer: Medicare Other

## 2016-01-30 DIAGNOSIS — Z0000 Encounter for general adult medical examination without abnormal findings: Secondary | ICD-10-CM | POA: Diagnosis not present

## 2016-01-30 LAB — HEPATIC FUNCTION PANEL
ALBUMIN: 4.3 g/dL (ref 3.5–5.2)
ALK PHOS: 56 U/L (ref 39–117)
ALT: 13 U/L (ref 0–35)
AST: 16 U/L (ref 0–37)
Bilirubin, Direct: 0.1 mg/dL (ref 0.0–0.3)
Total Bilirubin: 0.4 mg/dL (ref 0.2–1.2)
Total Protein: 7.5 g/dL (ref 6.0–8.3)

## 2016-01-30 LAB — LIPID PANEL
CHOL/HDL RATIO: 3
CHOLESTEROL: 173 mg/dL (ref 0–200)
HDL: 66.3 mg/dL (ref >39.00)
LDL Cholesterol: 89 mg/dL (ref 0–99)
NonHDL: 106.72
TRIGLYCERIDES: 89 mg/dL (ref 0.0–149.0)
VLDL: 17.8 mg/dL (ref 0.0–40.0)

## 2016-01-30 LAB — CBC WITH DIFFERENTIAL/PLATELET
Basophils Absolute: 0 10*3/uL (ref 0.0–0.1)
Basophils Relative: 0.4 % (ref 0.0–3.0)
EOS PCT: 1.4 % (ref 0.0–5.0)
Eosinophils Absolute: 0.1 10*3/uL (ref 0.0–0.7)
HEMATOCRIT: 38.7 % (ref 36.0–46.0)
HEMOGLOBIN: 13.1 g/dL (ref 12.0–15.0)
LYMPHS PCT: 33.5 % (ref 12.0–46.0)
Lymphs Abs: 2.7 10*3/uL (ref 0.7–4.0)
MCHC: 33.8 g/dL (ref 30.0–36.0)
MCV: 94.1 fl (ref 78.0–100.0)
MONOS PCT: 8.4 % (ref 3.0–12.0)
Monocytes Absolute: 0.7 10*3/uL (ref 0.1–1.0)
Neutro Abs: 4.6 10*3/uL (ref 1.4–7.7)
Neutrophils Relative %: 56.3 % (ref 43.0–77.0)
Platelets: 273 10*3/uL (ref 150.0–400.0)
RBC: 4.11 Mil/uL (ref 3.87–5.11)
RDW: 12.6 % (ref 11.5–15.5)
WBC: 8.1 10*3/uL (ref 4.0–10.5)

## 2016-01-30 LAB — TSH: TSH: 1.62 u[IU]/mL (ref 0.35–4.50)

## 2016-01-30 LAB — POC URINALSYSI DIPSTICK (AUTOMATED)
Bilirubin, UA: NEGATIVE
Blood, UA: NEGATIVE
GLUCOSE UA: NEGATIVE
Ketones, UA: NEGATIVE
LEUKOCYTES UA: NEGATIVE
NITRITE UA: NEGATIVE
PROTEIN UA: NEGATIVE
SPEC GRAV UA: 1.015
UROBILINOGEN UA: 0.2
pH, UA: 7

## 2016-01-30 LAB — BASIC METABOLIC PANEL
BUN: 17 mg/dL (ref 6–23)
CO2: 33 mEq/L — ABNORMAL HIGH (ref 19–32)
Calcium: 9.7 mg/dL (ref 8.4–10.5)
Chloride: 101 mEq/L (ref 96–112)
Creatinine, Ser: 0.72 mg/dL (ref 0.40–1.20)
GFR: 85.78 mL/min (ref >60.00)
GLUCOSE: 95 mg/dL (ref 70–99)
POTASSIUM: 3.8 meq/L (ref 3.5–5.1)
SODIUM: 138 meq/L (ref 135–145)

## 2016-02-18 ENCOUNTER — Ambulatory Visit (INDEPENDENT_AMBULATORY_CARE_PROVIDER_SITE_OTHER): Payer: Medicare Other | Admitting: Family Medicine

## 2016-02-18 ENCOUNTER — Encounter: Payer: Self-pay | Admitting: Family Medicine

## 2016-02-18 VITALS — BP 102/68 | HR 63 | Temp 97.3°F | Ht 61.0 in | Wt 118.7 lb

## 2016-02-18 DIAGNOSIS — K625 Hemorrhage of anus and rectum: Secondary | ICD-10-CM

## 2016-02-18 DIAGNOSIS — K644 Residual hemorrhoidal skin tags: Secondary | ICD-10-CM

## 2016-02-18 DIAGNOSIS — G43C0 Periodic headache syndromes in child or adult, not intractable: Secondary | ICD-10-CM

## 2016-02-18 DIAGNOSIS — N951 Menopausal and female climacteric states: Secondary | ICD-10-CM

## 2016-02-18 DIAGNOSIS — I4949 Other premature depolarization: Secondary | ICD-10-CM | POA: Diagnosis not present

## 2016-02-18 MED ORDER — BUTALBITAL-ASPIRIN-CAFFEINE 50-325-40 MG PO CAPS: ORAL_CAPSULE | ORAL | 1 refills | Status: DC

## 2016-02-18 MED ORDER — ZOLMITRIPTAN 2.5 MG PO TBDP: 2.5000 mg | ORAL_TABLET | ORAL | 4 refills | Status: DC | PRN

## 2016-02-18 MED ORDER — HYDROCORTISONE 2.5 % RE CREA: TOPICAL_CREAM | RECTAL | 4 refills | Status: DC

## 2016-02-18 MED ORDER — ATENOLOL 25 MG PO TABS: 25.0000 mg | ORAL_TABLET | Freq: Every day | ORAL | 4 refills | Status: DC

## 2016-02-18 MED ORDER — OMEPRAZOLE 20 MG PO CPDR: 20.0000 mg | DELAYED_RELEASE_CAPSULE | Freq: Every day | ORAL | 4 refills | Status: DC

## 2016-02-18 NOTE — Progress Notes (Signed)
[name] is a 66 year old married female nonsmoker who comes in today for general physical examination because of a history of palpitations well control with Tenormin 25 mg daily, migraine headaches for which she takes 1 or 2 Fiorinal a month. We talked about some of the new medication she would like to try some sublingual prophylactic medication  She takes Estrace 0.5 mg daily because of postmenopausal symptoms.  She uses Anusol. For hemorrhoids and Prilosec 20 mg daily because history of reflux.  She's on amoxicillin 500 mg 3 times a day by her dentist Dr. Gloriann Loan the right TMJ was inflamed but no evidence of infection.  She gets routine eye care, dental care, BSE monthly, annual mammography, colonoscopy 2014 normal  Vaccinations up-to-date she has had the shingles vaccine.  She had her uterus removed 1985 left ovary 1997. She continues to see her GYN yearly although she does not need pelvics her Paps anymore  She has light skin and light eyes and a lot of skin lesions removed so been some low-grade cancers.  Social history...Marland KitchenMarland KitchenMarland Kitchen married lives here in [place] daughter [name] has 3 children. She exercises on a daily basis  Cognitive function normal home health safety reviewed no issues identified, no guns in the house, she does have a healthcare power of attorney and living well  14 point review of systems reviewed and otherwise negative  BP 102/68 (BP Location: Left Arm, Patient Position: Sitting, Cuff Size: Normal)   Pulse 63   Temp 97.3 F (36.3 C) (Oral)   Ht 5\' 1"  (1.549 m)   Wt 118 lb 11.2 oz (53.8 kg)   BMI 22.43 kg/m  Examination of the HEENT were negative neck was supple no adenopathy thyroid normal cardiopulmonary exam normal breast exam normal abdominal exam normal pelvic rectal deferred extremities normal skin no peripheral pulses normal  #1 palpitations..... Continue Tenormin 25 mg daily  #2 migraine headaches....... trial of sublingual medication  #3  postmenopausal.......Marland Kitchen refill Estrace  #4 reflux esophagitis............. Prilosec but decreased to Monday Wednesday Friday  #5 history of skin cancer........ normal exam today

## 2016-02-18 NOTE — Patient Instructions (Signed)
Zomig 2.5 mg sublingual.......... one sublingual at the first sign of a headache may repeat in 2 hours as needed  Continue other medications  Follow-up in one year sooner if any problem

## 2016-03-23 ENCOUNTER — Telehealth: Payer: Self-pay

## 2016-03-23 NOTE — Telephone Encounter (Signed)
Received PA request from insurance company for Fiorinal [id number] stating that the medication would no longer be covered. PA submitted & is pending. Key: [name]

## 2016-03-24 NOTE — Telephone Encounter (Signed)
PA denied because the use is not supported by the Food and Drug Administration or by one of the Medicare approved references for treating your medical condition.

## 2016-03-25 ENCOUNTER — Telehealth: Payer: Self-pay | Admitting: Emergency Medicine

## 2016-03-25 NOTE — Telephone Encounter (Signed)
Spoke with Pt and gave her Dr.Todd recommendations since PA was denied for fiorinal to take [facility] and it should be a lot cheaper.

## 2016-04-28 ENCOUNTER — Telehealth: Payer: Self-pay

## 2016-04-28 NOTE — Telephone Encounter (Signed)
Spoke with pt and she states that this morning her BP and HR have returned to normal. Advised pt to continue to monitor today and if remains normal can take night time dose of medication. Advised pt not to drive when dizzy. Scheduled appt with Dr. Elease Hashimoto tomorrow as Dr. Sherren Mocha not in office. Pt advised if BP or HR extremely out of range to call O/C or seek UC/ED for evaluation.    [facility] Patient Name: [name] Gender: Female DOB: Dec 30, 1950 Age: 10 Y 14 M 13 D Return Phone Number: [phone] (Primary) City/State/Zip: [place] Client [facility] Night - Client Client Site [facility] - Night Physician Christie Nottingham - MD Who Is Calling Patient / Member / Family / Caregiver Call Type Triage / Clinical Caller Name [name] Relationship To Patient Self Return Phone Number [phone] (Primary) Chief Complaint Dizziness Reason for Call Symptomatic / Request for Health Information Initial Comment Call states she has problems with dizziness. BP is 109/52, pulse 54. It is time to take her dose of atenolol 25 mg, and not she is not sure if she should take it or not with these symptoms. Date/Time Eilene Ghazi Time): 04/27/2016 10:04:49 PM Confirm and document reason for call. If symptomatic, describe symptoms. ---Caller states was dizzy earlier, has rapid HR and mitral valve prolapse and take atenolol. BP 109/52 normally 110/68. Current HR 54, usually 64-72. Wonders if she should take night time dose. 25mg  atenolol. DoctorName Phone DateTime Action Result/Outcome Notes Pricilla Holm- MD [phone] 04/27/2016 10:13:04 PM Doctor Paged Hulen Skains On Call Provider - Caprice Kluver- MD 04/27/2016 10:33:21 PM Message Result Spoke with On Call - General MD recommends pt hold atenolol dose tonight and re-check in the morning. RN relayed message to caller who verbalized understandng. RN advised caller to contact pcp  tomorrow

## 2016-04-29 ENCOUNTER — Encounter: Payer: Self-pay | Admitting: Family Medicine

## 2016-04-29 ENCOUNTER — Ambulatory Visit (INDEPENDENT_AMBULATORY_CARE_PROVIDER_SITE_OTHER): Payer: Medicare Other | Admitting: Family Medicine

## 2016-04-29 VITALS — BP 102/68 | HR 69 | Temp 97.6°F | Wt 117.0 lb

## 2016-04-29 DIAGNOSIS — R001 Bradycardia, unspecified: Secondary | ICD-10-CM | POA: Diagnosis not present

## 2016-04-29 DIAGNOSIS — R42 Dizziness and giddiness: Secondary | ICD-10-CM

## 2016-04-29 NOTE — Patient Instructions (Signed)
Continue to minimize caffeine intake HOLD Atenolol for now.  Stay well hydrated Follow up for any recurrent dizziness or other concerns.

## 2016-04-29 NOTE — Progress Notes (Signed)
Subjective:     Patient ID: [name], female   DOB: 12/27/1950, 66 y.o.   MRN: [id number]  HPI   Patient seen with some recent dizziness and low pulse. She states she was diagnosed several years ago with "mitral valve prolapse ". She's not sure how this diagnosis was made. She is not sure she's had previous echocardiogram. In any event, she has been for several years on atenolol 25 mg once daily.   She was at the beach last week and Sunday night noticed some lightheadedness. No syncope. Occasional palpitation. She checked her blood pressure and obtained reading 109/52 with pulse of 54. She stopped her atenolol that point and her pulse has climbed up around mid to upper 60s with recent blood pressures around 416 to 606 systolic. Lightheadedness has improved slightly.  She states she had tachycardia when started on atenolol with question of PSVT. She's not had any chest pain. Good exercise capacity. Does drink fair amount of caffeine in the form of tea.  Past Medical History:  Diagnosis Date  . Anal fissure   . Anemia    during pregnancy  . Anxiety   . Basal cell carcinoma   . DDD (degenerative disc disease)   . Diverticulosis   . Hypokalemia   . LBP (low back pain)   . Melanoma ([place])   . Migraine   . MVP (mitral valve prolapse) 1990   psa  . Rosacea   . Ulcer ([place])    as a child   Past Surgical History:  Procedure Laterality Date  . ABDOMINAL HYSTERECTOMY  1985  . BREAST BIOPSY Left    secondary to infection  . I&D EXTREMITY Right 09/09/2012   Procedure: Minor IRRIGATION AND DEBRIDEMENT EXTREMITY paronychia of right thumb;  Surgeon: Wynonia Sours, MD;  Location: [facility];  Service: Orthopedics;  Laterality: Right;  . I&D EXTREMITY Left 01/26/2014   Procedure: MINOR IINCISION AND DRAINAGE paronychia left index finger;  Surgeon: Daryll Brod, MD;  Location: [facility];  Service: Orthopedics;  Laterality: Left;  left index  . OOPHORECTOMY Left 1994   . REPAIR KNEE LIGAMENT Left 1989  . TONSILLECTOMY  1955  . TUBAL LIGATION    . VEIN SURGERY      reports that she has never smoked. She has never used smokeless tobacco. She reports that she drinks about 0.5 oz of alcohol per week . She reports that she does not use drugs. family history includes Colitis in her father; Colon polyps in her father and mother; Esophageal cancer in her maternal aunt; Heart disease in her father and mother. Allergies  Allergen Reactions  . Codeine   . Compazine   . Gabapentin   . Prednisone     REACTION: makes heart beat hard \\T \ fast  . Sulfonamide Derivatives      Review of Systems  Constitutional: Negative for fatigue.  Eyes: Negative for visual disturbance.  Respiratory: Negative for cough, chest tightness, shortness of breath and wheezing.   Cardiovascular: Negative for chest pain and leg swelling.  Neurological: Positive for dizziness and light-headedness. Negative for seizures, syncope, weakness and headaches.       Objective:   Physical Exam  Constitutional: She appears well-developed and well-nourished.  Eyes: Pupils are equal, round, and reactive to light.  Neck: Neck supple. No JVD present. No thyromegaly present.  Cardiovascular: Normal rate and regular rhythm.  Exam reveals no gallop.   No murmur or mid-systolic click  Pulmonary/Chest: Effort  normal and breath sounds normal. No respiratory distress. She has no wheezes. She has no rales.  Musculoskeletal: She exhibits no edema.  Neurological: She is alert.       Assessment:     #1 vague history of reported "mitral valve prolapse ". No murmur noted on exam today and diagnosis questionable  #2 recent bradycardia and nonspecific lightheadedness with symptoms improved after holding beta blocker  # 3 hx of "tachycardia"-?PSVT    Plan:     -minimize Caffeine use -continue to hold atenolol for now -Set up echocardiogram to further assess  Eulas Post MD [facility]

## 2016-04-29 NOTE — Progress Notes (Signed)
Pre visit review using our clinic review tool, if applicable. No additional management support is needed unless otherwise documented below in the visit note. 

## 2016-05-01 ENCOUNTER — Other Ambulatory Visit: Payer: Self-pay

## 2016-05-01 ENCOUNTER — Ambulatory Visit (HOSPITAL_COMMUNITY): Payer: Medicare Other | Attending: Cardiology

## 2016-05-01 DIAGNOSIS — I081 Rheumatic disorders of both mitral and tricuspid valves: Secondary | ICD-10-CM | POA: Diagnosis not present

## 2016-05-01 DIAGNOSIS — R42 Dizziness and giddiness: Secondary | ICD-10-CM

## 2016-05-14 ENCOUNTER — Other Ambulatory Visit (HOSPITAL_COMMUNITY): Payer: Medicare Other

## 2016-10-30 ENCOUNTER — Encounter: Payer: Self-pay | Admitting: Family Medicine

## 2016-11-16 ENCOUNTER — Ambulatory Visit (INDEPENDENT_AMBULATORY_CARE_PROVIDER_SITE_OTHER): Payer: Medicare Other | Admitting: Family Medicine

## 2016-11-16 DIAGNOSIS — Z23 Encounter for immunization: Secondary | ICD-10-CM | POA: Diagnosis not present

## 2017-01-07 ENCOUNTER — Encounter (INDEPENDENT_AMBULATORY_CARE_PROVIDER_SITE_OTHER): Payer: Medicare Other | Admitting: Ophthalmology

## 2017-01-07 DIAGNOSIS — H2513 Age-related nuclear cataract, bilateral: Secondary | ICD-10-CM

## 2017-01-07 DIAGNOSIS — H3411 Central retinal artery occlusion, right eye: Secondary | ICD-10-CM | POA: Diagnosis not present

## 2017-01-07 DIAGNOSIS — H43813 Vitreous degeneration, bilateral: Secondary | ICD-10-CM

## 2017-01-21 ENCOUNTER — Encounter (INDEPENDENT_AMBULATORY_CARE_PROVIDER_SITE_OTHER): Payer: Medicare Other | Admitting: Ophthalmology

## 2017-01-21 DIAGNOSIS — H43813 Vitreous degeneration, bilateral: Secondary | ICD-10-CM

## 2017-01-21 DIAGNOSIS — H2513 Age-related nuclear cataract, bilateral: Secondary | ICD-10-CM | POA: Diagnosis not present

## 2017-01-21 DIAGNOSIS — H4311 Vitreous hemorrhage, right eye: Secondary | ICD-10-CM

## 2017-01-21 DIAGNOSIS — H00015 Hordeolum externum left lower eyelid: Secondary | ICD-10-CM

## 2017-02-15 ENCOUNTER — Encounter (INDEPENDENT_AMBULATORY_CARE_PROVIDER_SITE_OTHER): Payer: Medicare Other | Admitting: Ophthalmology

## 2017-02-15 DIAGNOSIS — H2513 Age-related nuclear cataract, bilateral: Secondary | ICD-10-CM | POA: Diagnosis not present

## 2017-02-15 DIAGNOSIS — H43813 Vitreous degeneration, bilateral: Secondary | ICD-10-CM | POA: Diagnosis not present

## 2017-02-24 ENCOUNTER — Encounter: Payer: Self-pay | Admitting: Family Medicine

## 2017-02-24 ENCOUNTER — Encounter: Payer: Medicare Other | Admitting: Family Medicine

## 2017-02-24 ENCOUNTER — Ambulatory Visit (INDEPENDENT_AMBULATORY_CARE_PROVIDER_SITE_OTHER): Payer: Medicare Other | Admitting: Family Medicine

## 2017-02-24 VITALS — BP 100/68 | HR 78 | Ht 61.0 in | Wt 112.0 lb

## 2017-02-24 DIAGNOSIS — G4700 Insomnia, unspecified: Secondary | ICD-10-CM | POA: Insufficient documentation

## 2017-02-24 DIAGNOSIS — J309 Allergic rhinitis, unspecified: Secondary | ICD-10-CM

## 2017-02-24 DIAGNOSIS — Z0000 Encounter for general adult medical examination without abnormal findings: Secondary | ICD-10-CM | POA: Diagnosis not present

## 2017-02-24 DIAGNOSIS — G43C0 Periodic headache syndromes in child or adult, not intractable: Secondary | ICD-10-CM

## 2017-02-24 DIAGNOSIS — Z719 Counseling, unspecified: Secondary | ICD-10-CM | POA: Insufficient documentation

## 2017-02-24 LAB — POCT URINALYSIS DIPSTICK
Bilirubin, UA: NEGATIVE
Blood, UA: NEGATIVE
Glucose, UA: NEGATIVE
Ketones, UA: NEGATIVE
Leukocytes, UA: NEGATIVE
Nitrite, UA: NEGATIVE
Protein, UA: NEGATIVE
Spec Grav, UA: 1.015 (ref 1.010–1.025)
Urobilinogen, UA: 0.2 E.U./dL
pH, UA: 6.5 (ref 5.0–8.0)

## 2017-02-24 LAB — CBC WITH DIFFERENTIAL/PLATELET
Basophils Absolute: 0.1 10*3/uL (ref 0.0–0.1)
Basophils Relative: 0.6 % (ref 0.0–3.0)
Eosinophils Absolute: 0.1 10*3/uL (ref 0.0–0.7)
Eosinophils Relative: 0.7 % (ref 0.0–5.0)
HCT: 42.3 % (ref 36.0–46.0)
Hemoglobin: 13.8 g/dL (ref 12.0–15.0)
Lymphocytes Relative: 15 % (ref 12.0–46.0)
Lymphs Abs: 1.3 10*3/uL (ref 0.7–4.0)
MCHC: 32.6 g/dL (ref 30.0–36.0)
MCV: 96.7 fl (ref 78.0–100.0)
Monocytes Absolute: 0.6 10*3/uL (ref 0.1–1.0)
Monocytes Relative: 6.3 % (ref 3.0–12.0)
Neutro Abs: 6.7 10*3/uL (ref 1.4–7.7)
Neutrophils Relative %: 77.4 % — ABNORMAL HIGH (ref 43.0–77.0)
Platelets: 286 10*3/uL (ref 150.0–400.0)
RBC: 4.37 Mil/uL (ref 3.87–5.11)
RDW: 12.6 % (ref 11.5–15.5)
WBC: 8.7 10*3/uL (ref 4.0–10.5)

## 2017-02-24 LAB — BASIC METABOLIC PANEL
BUN: 17 mg/dL (ref 6–23)
CALCIUM: 10 mg/dL (ref 8.4–10.5)
CO2: 33 mEq/L — ABNORMAL HIGH (ref 19–32)
Chloride: 101 mEq/L (ref 96–112)
Creatinine, Ser: 0.67 mg/dL (ref 0.40–1.20)
GFR: 92.91 mL/min (ref >60.00)
GLUCOSE: 88 mg/dL (ref 70–99)
Potassium: 3.9 mEq/L (ref 3.5–5.1)
SODIUM: 140 meq/L (ref 135–145)

## 2017-02-24 LAB — TSH: TSH: 1.5 u[IU]/mL (ref 0.35–4.50)

## 2017-02-24 MED ORDER — ZOLMITRIPTAN 2.5 MG PO TBDP: 2.5000 mg | ORAL_TABLET | ORAL | 4 refills | Status: DC | PRN

## 2017-02-24 MED ORDER — ATENOLOL 25 MG PO TABS: ORAL_TABLET | ORAL | 4 refills | Status: DC

## 2017-02-24 MED ORDER — SERTRALINE HCL 25 MG PO TABS: 25.0000 mg | ORAL_TABLET | Freq: Every day | ORAL | 4 refills | Status: DC

## 2017-02-24 NOTE — Progress Notes (Signed)
[name] is a 67 year old married female nonsmoker who comes in today for general physical examination because of the following issues  For the past 2 months she's had sleep dysfunction. She wakes up at 12:30 and can't go back to sleep then she'll sleep and wake up at 4:30. This is been going on for about 2-3 months. She's dealing with illness in her family and she thinks this is all stress related. She states she has a friend who took Zoloft 25 mg at bedtime and it helped her and she wants to try that.  She's also difficulty with her right eye. She had a bleed in the retina. She saw Dr. Zigmund Daniel. No treatment was indicated at the time they're just following her. She does have some blurriness in that right eye.  Since June she's had postnasal drip and slight cough.  She has migraine headaches for which she takes Zomig when necessary. In 2018 she had about 3-4 migraine she states. All of which went away with a Zomig if she took it immediately.  She's off the Tenormin. She took that in the past for episodes of rapid heart rate. It occurred occasionally she would like to have some around just in case  She takes a estrogen tablet Estrace 0.5 daily via GYN  She gets routine eye care, dental care, colonoscopy up-to-date  Vaccinations up-to-date information given on the shingles vaccine  Cognitive function normal she walks daily home health safety reviewed no issues identified, no guns in the house, she does have a healthcare power of attorney and living well  14 point review of systems reviewed and otherwise negative  BP 100/68 (BP Location: Left Arm, Patient Position: Sitting, Cuff Size: Normal)   Pulse 78   Ht 5\' 1"  (1.549 m)   Wt 112 lb (50.8 kg)   BMI 21.16 kg/m  Well-developed well-nourished female no acute distress HEENT were negative neck was supple thyroid is not enlarged cardiopulmonary exam normal breast exam normal Dahms exam normal pelvic and rectal by GYN therefore deferred extremities  normal skin normal peripheral pulses normal.  #1 healthy female  #2 stress with sleep disturbance and anxiety over family situation.........Marland Kitchen Zoloft 25 mg daily at bedtime  #3 allergic rhinitis...........Marland Kitchen plain Zyrtec and one shot of steroid nasal spray up each nostril at bedtime  #4 history of palpitations............ Tenormin 12.5 mg when necessary  #5 postmenopausal............ continue Estrace view GYN  #6 migraine headaches......... continue Zomig when necessary

## 2017-02-24 NOTE — Patient Instructions (Signed)
Labs today I will call you if there is anything abnormal  Zoloft 25 mg...........Marland Kitchen 1 tablet at bedtime  Tenormin 25 mg........... one half tablet when necessary for palpitations and rapid heart rate  Remember to take the Zomig immediately if you have any symptoms of a migraine headache starting.  Walk 30 minutes daily  Return in one year for general physical exam sooner if any problems  For urology problems would recommend the following........Marland Kitchen plain Zyrtec at the bedtime and one shot of steroid nasal spray up each nostril at bedtime

## 2017-03-02 ENCOUNTER — Encounter: Payer: Medicare Other | Admitting: Family Medicine

## 2017-04-14 LAB — HM MAMMOGRAPHY

## 2017-04-22 ENCOUNTER — Encounter: Payer: Self-pay | Admitting: Family Medicine

## 2017-05-11 ENCOUNTER — Encounter: Payer: Self-pay | Admitting: Adult Health

## 2017-05-11 ENCOUNTER — Ambulatory Visit: Payer: Medicare Other | Admitting: Adult Health

## 2017-05-11 VITALS — BP 124/56 | Temp 97.9°F | Wt 116.0 lb

## 2017-05-11 DIAGNOSIS — B029 Zoster without complications: Secondary | ICD-10-CM

## 2017-05-11 MED ORDER — VALACYCLOVIR HCL 1 G PO TABS: 1000.0000 mg | ORAL_TABLET | Freq: Three times a day (TID) | ORAL | 0 refills | Status: DC

## 2017-05-11 NOTE — Progress Notes (Signed)
Subjective:    Patient ID: [name], female    DOB: July 30, 1950, 67 y.o.   MRN: [id number]  HPI  67 year old female who  has a past medical history of Anal fissure, Anemia, Anxiety, Basal cell carcinoma, DDD (degenerative disc disease), Diverticulosis, Hypokalemia, LBP (low back pain), Melanoma ([place]), Migraine, MVP (mitral valve prolapse) (1990), Rosacea, and Ulcer.   She presents to the office today for two painful "bumps" on her left shoulder. She reports having shingles many years ago and the pain is the same, " hurts to the bone". She also reports being outside over the weekend and was working in the yard.   Review of Systems See HPI   Past Medical History:  Diagnosis Date  . Anal fissure   . Anemia    during pregnancy  . Anxiety   . Basal cell carcinoma   . DDD (degenerative disc disease)   . Diverticulosis   . Hypokalemia   . LBP (low back pain)   . Melanoma ([place])   . Migraine   . MVP (mitral valve prolapse) 1990   psa  . Rosacea   . Ulcer    as a child    Social History   Socioeconomic History  . Marital status: Married    Spouse name: Not on file  . Number of children: 2  . Years of education: Not on file  . Highest education level: Not on file  Occupational History  . Occupation: retired Chief of Staff  . Financial resource strain: Not on file  . Food insecurity:    Worry: Not on file    Inability: Not on file  . Transportation needs:    Medical: Not on file    Non-medical: Not on file  Tobacco Use  . Smoking status: Never Smoker  . Smokeless tobacco: Never Used  Substance and Sexual Activity  . Alcohol use: Yes    Alcohol/week: 0.5 oz    Types: 1 Standard drinks or equivalent per week    Comment: social wine  . Drug use: No  . Sexual activity: Yes  Lifestyle  . Physical activity:    Days per week: Not on file    Minutes per session: Not on file  . Stress: Not on file  Relationships  . Social connections:    Talks on  phone: Not on file    Gets together: Not on file    Attends religious service: Not on file    Active member of club or organization: Not on file    Attends meetings of clubs or organizations: Not on file    Relationship status: Not on file  . Intimate partner violence:    Fear of current or ex partner: Not on file    Emotionally abused: Not on file    Physically abused: Not on file    Forced sexual activity: Not on file  Other Topics Concern  . Not on file  Social History Narrative  . Not on file    Past Surgical History:  Procedure Laterality Date  . ABDOMINAL HYSTERECTOMY  1985  . BREAST BIOPSY Left    secondary to infection  . I&D EXTREMITY Right 09/09/2012   Procedure: Minor IRRIGATION AND DEBRIDEMENT EXTREMITY paronychia of right thumb;  Surgeon: Wynonia Sours, MD;  Location: [facility];  Service: Orthopedics;  Laterality: Right;  . I&D EXTREMITY Left 01/26/2014   Procedure: MINOR IINCISION AND DRAINAGE paronychia left index finger;  Surgeon:  Daryll Brod, MD;  Location: [facility];  Service: Orthopedics;  Laterality: Left;  left index  . OOPHORECTOMY Left 1994  . REPAIR KNEE LIGAMENT Left 1989  . TONSILLECTOMY  1955  . TUBAL LIGATION    . VEIN SURGERY      Family History  Problem Relation Age of Onset  . Heart disease Mother   . Colon polyps Mother   . Heart disease Father   . Colitis Father   . Colon polyps Father   . Esophageal cancer Maternal Aunt   . Colon cancer Neg Hx     Allergies  Allergen Reactions  . Codeine   . Compazine   . Gabapentin   . Prednisone     REACTION: makes heart beat hard \\T \ fast  . Sulfonamide Derivatives     Current Outpatient Medications on File Prior to Visit  Medication Sig Dispense Refill  . Ascorbic Acid (VITAMIN C) 100 MG tablet Take 100 mg by mouth daily.      Marland Kitchen aspirin 81 MG tablet Take 81 mg by mouth. ON OCCASION    . atenolol (TENORMIN) 25 MG tablet One half tablet when necessary for  palpitations 30 tablet 4  . BIOTIN PO Take by mouth.    . calcium-vitamin D 250-100 MG-UNIT per tablet Take 1 tablet by mouth daily.      Marland Kitchen doxycycline (VIBRAMYCIN) 100 MG capsule doxycycline hyclate 100 mg capsule  TK ONE C PO BID WF FOR 7 DAYS    . estradiol (ESTRACE) 0.5 MG tablet Take 0.5 mg by mouth daily.    . hydrocortisone (ANUSOL-HC) 2.5 % rectal cream Apply rectally at bedtime, x 3 weeks 30 g 4  . loteprednol (LOTEMAX) 0.2 % SUSP 1 drop 4 (four) times daily.    . NON FORMULARY Hydro eye    . omeprazole (PRILOSEC) 20 MG capsule Take 1 capsule (20 mg total) by mouth daily. AS NEEDED 100 capsule 4  . vitamin E 100 UNIT capsule Take 100 Units by mouth daily.      Marland Kitchen zolmitriptan (ZOMIG ZMT) 2.5 MG disintegrating tablet Take 1 tablet (2.5 mg total) by mouth as needed for migraine. 10 tablet 4   No current facility-administered medications on file prior to visit.     BP (!) 124/56 (BP Location: Left Arm)   Temp 97.9 F (36.6 C) (Oral)   Wt 116 lb (52.6 kg)   BMI 21.92 kg/m       Objective:   Physical Exam  Constitutional: She is oriented to person, place, and time. She appears well-developed and well-nourished. No distress.  Cardiovascular: Normal rate, regular rhythm, normal heart sounds and intact distal pulses. Exam reveals no gallop and no friction rub.  No murmur heard. Pulmonary/Chest: Effort normal and breath sounds normal. No respiratory distress. She has no wheezes. She has no rales. She exhibits no tenderness.  Neurological: She is alert and oriented to person, place, and time.  Skin: Skin is warm and dry. She is not diaphoretic.  Two small red bumps noted on left shoulder blade. Not vesicular in nature. Painful to touch. No drainage noted.   Psychiatric: She has a normal mood and affect. Her behavior is normal. Judgment and thought content normal.  Nursing note and vitals reviewed.     Assessment & Plan:  1. Herpes zoster without complication - Appears as  possible shingles outbreak vs bugbite. Will treat with Valtrex 1gm TID x 7 days  - She can also apply cortisone cream  as needed - Follow up if no improvement   Dorothyann Peng, NP

## 2017-05-12 ENCOUNTER — Telehealth: Payer: Self-pay | Admitting: Adult Health

## 2017-05-12 ENCOUNTER — Other Ambulatory Visit: Payer: Self-pay | Admitting: Adult Health

## 2017-05-12 MED ORDER — VALACYCLOVIR HCL 500 MG PO TABS: 1000.0000 mg | ORAL_TABLET | Freq: Three times a day (TID) | ORAL | 0 refills | Status: AC

## 2017-05-12 NOTE — Telephone Encounter (Signed)
Pt notified that rx was sent to the pharmacy.

## 2017-05-12 NOTE — Telephone Encounter (Signed)
Sent to pharmacy 

## 2017-05-12 NOTE — Telephone Encounter (Signed)
Copied from [employer]. Topic: Quick Communication - See Telephone Encounter >> May 12, 2017 10:11 AM Ahmed Prima L wrote: CRM for notification. See Telephone encounter for: 05/12/17.  Patient was seen in the office 4/2 by [facility] He gave her valACYclovir (VALTREX) 1000 MG tablet, she said the pills are to large to swallow and does not prefer to cut them in half. She wants to know if she can have the same script but change it to 500mg . Please advise.   [facility] [place], [address] AT [address]

## 2017-05-17 ENCOUNTER — Encounter (INDEPENDENT_AMBULATORY_CARE_PROVIDER_SITE_OTHER): Payer: Medicare Other | Admitting: Ophthalmology

## 2017-05-18 ENCOUNTER — Encounter: Payer: Self-pay | Admitting: Gastroenterology

## 2017-05-20 ENCOUNTER — Encounter (INDEPENDENT_AMBULATORY_CARE_PROVIDER_SITE_OTHER): Payer: Medicare Other | Admitting: Ophthalmology

## 2017-05-20 DIAGNOSIS — H2513 Age-related nuclear cataract, bilateral: Secondary | ICD-10-CM

## 2017-05-20 DIAGNOSIS — H43813 Vitreous degeneration, bilateral: Secondary | ICD-10-CM

## 2017-11-22 ENCOUNTER — Encounter (INDEPENDENT_AMBULATORY_CARE_PROVIDER_SITE_OTHER): Payer: Medicare Other | Admitting: Ophthalmology

## 2017-11-23 ENCOUNTER — Encounter: Payer: Self-pay | Admitting: Plastic Surgery

## 2017-11-23 ENCOUNTER — Ambulatory Visit: Payer: Medicare Other | Admitting: Plastic Surgery

## 2017-11-23 DIAGNOSIS — Z719 Counseling, unspecified: Secondary | ICD-10-CM

## 2017-11-23 NOTE — Progress Notes (Signed)
   Subjective:    Patient ID: [name], female    DOB: 07-24-1950, 67 y.o.   MRN: [id number]  The patient is a 67 year old white female here for evaluation of her face.  She would like to learn more about what her options are for looking younger in facial rejuvenation.  She does not like the wrinkles in her face or looking tired.  She has some loss of volume of her midface and some laxity of skin.  She has some sun exposure but states it was recent due to being on doxycycline and not realizing it would make her photosensitive.  She is due to see the dermatologist later this week.  She does not want to look fake or like she has had surgery.  She would like to look refreshed.  She is otherwise in good health.  She does not smoke.  She has not had any recent illnesses.  She does have a history of basal cell carcinoma on the face and is seen regularly for follow-up.  She is not particularly interested in Botox however she will consider filler laser and surgical intervention   Review of Systems  Constitutional: Negative for activity change and appetite change.  HENT: Negative.   Eyes: Negative.   Respiratory: Negative.   Cardiovascular: Negative.  Negative for leg swelling.  Genitourinary: Negative.   Musculoskeletal: Negative.   Skin: Negative.   Hematological: Negative.       Objective:   Physical Exam  Constitutional: She is oriented to person, place, and time. She appears well-developed and well-nourished.  HENT:  Head: Normocephalic and atraumatic.  Eyes: Pupils are equal, round, and reactive to light. EOM are normal.  Cardiovascular: Normal rate.  Pulmonary/Chest: Effort normal.  Neurological: She is alert and oriented to person, place, and time.  Skin: Skin is warm.  Psychiatric: She has a normal mood and affect. Her behavior is normal. Thought content normal.       Assessment & Plan:  Encounter for counseling  We discussed the options for filler, Botox, laser and a face  lift.  She is not interested in Botox at this time.  She would like to consider a filler at the next appointment.  She is also interested in laser.  I explained that this would not do much for tightening of her skin but would be good for any hyperpigmentation or vascular irregularities like rosacea which she does have.  She would like to come and see [place] once we are in the new office and have the laser set up.  She will give [place] a call mid November and we should know more about her timing.

## 2017-12-21 ENCOUNTER — Ambulatory Visit: Payer: Medicare Other | Admitting: Family Medicine

## 2017-12-21 ENCOUNTER — Encounter: Payer: Self-pay | Admitting: Family Medicine

## 2017-12-21 VITALS — BP 102/60 | Temp 97.8°F | Wt 117.0 lb

## 2017-12-21 DIAGNOSIS — G4700 Insomnia, unspecified: Secondary | ICD-10-CM

## 2017-12-21 DIAGNOSIS — G43C0 Periodic headache syndromes in child or adult, not intractable: Secondary | ICD-10-CM

## 2017-12-21 DIAGNOSIS — R002 Palpitations: Secondary | ICD-10-CM

## 2017-12-21 DIAGNOSIS — K625 Hemorrhage of anus and rectum: Secondary | ICD-10-CM

## 2017-12-21 DIAGNOSIS — K644 Residual hemorrhoidal skin tags: Secondary | ICD-10-CM

## 2017-12-21 MED ORDER — ZOLMITRIPTAN 2.5 MG PO TBDP: 2.5000 mg | ORAL_TABLET | ORAL | 4 refills | Status: DC | PRN

## 2017-12-21 MED ORDER — HYDROCORTISONE 2.5 % RE CREA: TOPICAL_CREAM | RECTAL | 4 refills | Status: DC

## 2017-12-21 MED ORDER — TRAZODONE HCL 50 MG PO TABS: ORAL_TABLET | ORAL | 3 refills | Status: DC

## 2017-12-21 MED ORDER — ATENOLOL 25 MG PO TABS: ORAL_TABLET | ORAL | 4 refills | Status: DC

## 2017-12-21 MED ORDER — OMEPRAZOLE 20 MG PO CPDR: 20.0000 mg | DELAYED_RELEASE_CAPSULE | Freq: Every day | ORAL | 4 refills | Status: DC

## 2017-12-21 NOTE — Progress Notes (Signed)
[name] is a delightful 67 year old married female non-smoker who is been my patient for 40+ years who comes in today for the following issues  About a year ago she began having difficulty with sleep.  She says she goes to sleep okay around 11 PM but then wakes up at 2 AM and cannot go back to sleep.  She saw her gynecologist Dr. Okey Regal who gave her Ambien.  However she states the Ambien has caused some confusion and change in her memory.  She therefore stopped taking it.  She takes Tenormin 12.5 mg as needed for palpitations.  Uses estrogen 0.5 daily because of postmenopausal vaginal dryness and hot flushes.  She uses Anusol cream as needed for rectal irritation  Uses Zomig MLT because of migraines in the past 12 months she is only had 4 migraines  She exercises daily  She drinks caffeine throughout the day  In the past 12 months she spent most of her time taking care of her mom and dad.  They live down the street from her and have a lot of health problems  BP 102/60 (BP Location: Right Arm, Patient Position: Sitting, Cuff Size: Normal)   Temp 97.8 F (36.6 C) (Oral)   Wt 117 lb (53.1 kg)   BMI 22.11 kg/m  Well-developed well-nourished female no acute distress vital signs stable she is afebrile cardiac exam today normal  1.  Sleep dysfunction...Marland KitchenMarland KitchenMarland Kitchen recommended no caffeine after 9 AM.... Morning exercise...Marland KitchenMarland KitchenMarland Kitchen melatonin at bedtime........ trazodone if melatonin does not work

## 2017-12-21 NOTE — Patient Instructions (Signed)
For your sleep dysfunction I would recommend the following.......... 30 minutes of walking daily in the morning early afternoon....... no caffeine after 9 AM..... Melatonin at bedtime.... If after 4 to 6 weeks you see  no improvement in her sleep add trazodone 50 mg nightly  I want to thank you and your family for coming to see me all these years.  Good luck in the future.  And thanks for all my golf apparel through the years

## 2018-02-15 ENCOUNTER — Ambulatory Visit: Payer: Medicare Other | Admitting: Plastic Surgery

## 2018-02-22 ENCOUNTER — Other Ambulatory Visit: Payer: Self-pay

## 2018-02-22 ENCOUNTER — Encounter (HOSPITAL_BASED_OUTPATIENT_CLINIC_OR_DEPARTMENT_OTHER): Payer: Self-pay | Admitting: *Deleted

## 2018-02-22 ENCOUNTER — Ambulatory Visit: Payer: Self-pay | Admitting: *Deleted

## 2018-02-22 ENCOUNTER — Emergency Department (HOSPITAL_BASED_OUTPATIENT_CLINIC_OR_DEPARTMENT_OTHER)
Admission: EM | Admit: 2018-02-22 | Discharge: 2018-02-22 | Disposition: A | Discharge status: Home / Self Care | Payer: Medicare Other | Attending: Emergency Medicine | Admitting: Emergency Medicine

## 2018-02-22 ENCOUNTER — Emergency Department (HOSPITAL_BASED_OUTPATIENT_CLINIC_OR_DEPARTMENT_OTHER): Payer: Medicare Other

## 2018-02-22 DIAGNOSIS — Z85828 Personal history of other malignant neoplasm of skin: Secondary | ICD-10-CM | POA: Insufficient documentation

## 2018-02-22 DIAGNOSIS — Z79899 Other long term (current) drug therapy: Secondary | ICD-10-CM | POA: Diagnosis not present

## 2018-02-22 DIAGNOSIS — Z7982 Long term (current) use of aspirin: Secondary | ICD-10-CM | POA: Insufficient documentation

## 2018-02-22 DIAGNOSIS — R0789 Other chest pain: Secondary | ICD-10-CM | POA: Insufficient documentation

## 2018-02-22 LAB — BASIC METABOLIC PANEL
Anion gap: 8 (ref 5–15)
BUN: 15 mg/dL (ref 8–23)
CO2: 26 mmol/L (ref 22–32)
Calcium: 10.4 mg/dL — ABNORMAL HIGH (ref 8.9–10.3)
Chloride: 103 mmol/L (ref 98–111)
Creatinine, Ser: 0.77 mg/dL (ref 0.44–1.00)
GFR calc Af Amer: >60 mL/min (ref >60)
GFR calc non Af Amer: >60 mL/min (ref >60)
Glucose, Bld: 123 mg/dL — ABNORMAL HIGH (ref 70–99)
Potassium: 3.7 mmol/L (ref 3.5–5.1)
Sodium: 137 mmol/L (ref 135–145)

## 2018-02-22 LAB — TROPONIN I
Troponin I: <0.03 ng/mL (ref <0.03)
Troponin I: <0.03 ng/mL (ref <0.03)

## 2018-02-22 LAB — CBC
HCT: 41.3 % (ref 36.0–46.0)
Hemoglobin: 12.9 g/dL (ref 12.0–15.0)
MCH: 30.6 pg (ref 26.0–34.0)
MCHC: 31.2 g/dL (ref 30.0–36.0)
MCV: 98.1 fL (ref 80.0–100.0)
NRBC: 0 % (ref 0.0–0.2)
Platelets: 262 10*3/uL (ref 150–400)
RBC: 4.21 MIL/uL (ref 3.87–5.11)
RDW: 12.3 % (ref 11.5–15.5)
WBC: 8.9 10*3/uL (ref 4.0–10.5)

## 2018-02-22 MED ORDER — ALUM & MAG HYDROXIDE-SIMETH 200-200-20 MG/5ML PO SUSP
30.0000 mL | Freq: Once | ORAL | Status: AC
  Administered 2018-02-22: 30 mL via ORAL
  Filled 2018-02-22: qty 30

## 2018-02-22 NOTE — Telephone Encounter (Signed)
Pt has arrived at [facility].

## 2018-02-22 NOTE — ED Notes (Signed)
Pt verbalized understanding of dc instructions.

## 2018-02-22 NOTE — ED Triage Notes (Signed)
Epigastric pain that comes and goes since this am.

## 2018-02-22 NOTE — ED Notes (Signed)
Pt enrolled in aromatherapy pain trial 

## 2018-02-22 NOTE — ED Provider Notes (Signed)
[place] EMERGENCY DEPARTMENT Provider Note   CSN: [id number] Arrival date & time: 02/22/18  1330     History   Chief Complaint Chief Complaint  Patient presents with  . Chest Pain    HPI [name] is a 68 y.o. female.  Patient is a 68 year old female who presents with chest pain.  She states that she has had intermittent chest pain since this morning.  She describes it as feeling like a lemon is seen in her lower chest/epigastrium.  She states is been coming and going all day.  There is nothing really that brings it on.  She has been walking up and down stairs with no exacerbation of the pain.  No associated shortness of breath.  No radiation of the pain.  There was a note from triage nurse that said she had associated jaw pain but she denies this.  She states that 2 days ago she had some jaw pain associated with nausea and she attributed to the nausea but has not had any since then and does not have any associated with chest pain.  She has not had any chest pain prior to today.  She has no pleuritic type symptoms.  No increased GERD.  No fevers.  No nausea or vomiting associated with the pain.  No diaphoresis.  No history of heart problems in the past.  No history of diabetes, hypertension, hyperlipidemia or smoking.  No family history of early heart disease.     Past Medical History:  Diagnosis Date  . Anal fissure   . Anemia    during pregnancy  . Anxiety   . Basal cell carcinoma   . DDD (degenerative disc disease)   . Diverticulosis   . Hypokalemia   . LBP (low back pain)   . Melanoma ([place])   . Migraine   . MVP (mitral valve prolapse) 1990   psa  . Rosacea   . Ulcer    as a child    Patient Active Problem List   Diagnosis Date Noted  . Insomnia 02/24/2017  . Encounter for counseling 02/24/2017  . Conjunctivitis 03/01/2014  . Seborrheic keratosis, inflamed 01/05/2012  . Postmenopausal disorder 12/22/2011  . Skin cancer 07/14/2011  . Migraine  05/14/2007  . IRRITABLE BOWEL SYNDROME 05/14/2007  . Allergic rhinitis 04/07/2007  . DERMATITIS DUE TO SOLAR RADIATION 04/07/2007  . Premature beat 09/20/2006  . DIVERTICULOSIS, COLON 04/13/2002    Past Surgical History:  Procedure Laterality Date  . ABDOMINAL HYSTERECTOMY  1985  . BREAST BIOPSY Left    secondary to infection  . I&D EXTREMITY Right 09/09/2012   Procedure: Minor IRRIGATION AND DEBRIDEMENT EXTREMITY paronychia of right thumb;  Surgeon: Wynonia Sours, MD;  Location: [facility];  Service: Orthopedics;  Laterality: Right;  . I&D EXTREMITY Left 01/26/2014   Procedure: MINOR IINCISION AND DRAINAGE paronychia left index finger;  Surgeon: Daryll Brod, MD;  Location: [facility] [facility];  Service: Orthopedics;  Laterality: Left;  left index  . OOPHORECTOMY Left 1994  . REPAIR KNEE LIGAMENT Left 1989  . TONSILLECTOMY  1955  . TUBAL LIGATION    . VEIN SURGERY       OB History   No obstetric history on file.      Home Medications    Prior to Admission medications   Medication Sig Start Date End Date Taking? Authorizing Provider  Ascorbic Acid (VITAMIN C) 100 MG tablet Take 100 mg by mouth daily.  [provider]  aspirin 81 MG tablet Take 81 mg by mouth. ON OCCASION    [provider]  atenolol (TENORMIN) 25 MG tablet One half tablet when necessary for palpitations 12/21/17   Dorena Cookey, MD  BIOTIN PO Take by mouth.    [provider]  calcium-vitamin D 250-100 MG-UNIT per tablet Take 1 tablet by mouth daily.      [provider]  doxycycline (VIBRAMYCIN) 100 MG capsule doxycycline hyclate 100 mg capsule  TK ONE C PO BID WF FOR 7 DAYS    [provider]  estradiol (ESTRACE) 0.5 MG tablet Take 0.5 mg by mouth daily.    [provider]  hydrocortisone (ANUSOL-HC) 2.5 % rectal cream Apply rectally at bedtime, x 3 weeks 12/21/17   Dorena Cookey, MD  NON [facility] eye    [provider]  omeprazole (PRILOSEC) 20 MG capsule Take 1 capsule (20 mg total) by mouth daily. AS NEEDED 12/21/17   Dorena Cookey, MD  traZODone (DESYREL) 50 MG tablet 1 p.o. nightly for sleep 12/21/17   Dorena Cookey, MD  vitamin E 100 UNIT capsule Take 100 Units by mouth daily.      [provider]  zolmitriptan (ZOMIG ZMT) 2.5 MG disintegrating tablet Take 1 tablet (2.5 mg total) by mouth as needed for migraine. 12/21/17   Dorena Cookey, MD    Family History Family History  Problem Relation Age of Onset  . Heart disease Mother   . Colon polyps Mother   . Heart disease Father   . Colitis Father   . Colon polyps Father   . Esophageal cancer Maternal Aunt   . Colon cancer Neg Hx     Social History Social History   Tobacco Use  . Smoking status: Never Smoker  . Smokeless tobacco: Never Used  Substance Use Topics  . Alcohol use: Yes    Alcohol/week: 1.0 standard drinks    Types: 1 Standard drinks or equivalent per week    Comment: social wine  . Drug use: No     Allergies   Codeine; Compazine; Gabapentin; Prednisone; Prochlorperazine edisylate; Sulfa antibiotics; and Sulfonamide derivatives   Review of Systems Review of Systems  Constitutional: Negative for chills, diaphoresis, fatigue and fever.  HENT: Negative for congestion, rhinorrhea and sneezing.   Eyes: Negative.   Respiratory: Negative for cough, chest tightness and shortness of breath.   Cardiovascular: Positive for chest pain. Negative for leg swelling.  Gastrointestinal: Negative for abdominal pain, blood in stool, diarrhea, nausea and vomiting.  Genitourinary: Negative for difficulty urinating, flank pain, frequency and hematuria.  Musculoskeletal: Negative for arthralgias and back pain.  Skin: Negative for rash.  Neurological: Negative for dizziness, speech difficulty, weakness, numbness and headaches.     Physical Exam Updated Vital Signs BP 129/69   Pulse 62   Temp 98.1 F (36.7  C) (Oral)   Resp 14   Ht 5\' 2"  (1.575 m)   Wt 49.9 kg   SpO2 100%   BMI 20.12 kg/m   Physical Exam Constitutional:      Appearance: She is well-developed.  HENT:     Head: Normocephalic and atraumatic.  Eyes:     Pupils: Pupils are equal, round, and reactive to light.  Neck:     Musculoskeletal: Normal range of motion and neck supple.  Cardiovascular:     Rate and Rhythm: Normal rate and regular rhythm.     Heart sounds: Normal heart  sounds.  Pulmonary:     Effort: Pulmonary effort is normal. No respiratory distress.     Breath sounds: Normal breath sounds. No wheezing or rales.  Chest:     Chest wall: No tenderness.  Abdominal:     General: Bowel sounds are normal.     Palpations: Abdomen is soft.     Tenderness: There is abdominal tenderness (Mild tenderness to epigastrium). There is no guarding or rebound.  Musculoskeletal: Normal range of motion.  Lymphadenopathy:     Cervical: No cervical adenopathy.  Skin:    General: Skin is warm and dry.     Findings: No rash.  Neurological:     Mental Status: She is alert and oriented to person, place, and time.      ED Treatments / Results  Labs (all labs ordered are listed, but only abnormal results are displayed) Labs Reviewed  BASIC METABOLIC PANEL - Abnormal; Notable for the following components:      Result Value   Glucose, Bld 123 (*)    Calcium 10.4 (*)    All other components within normal limits  CBC  TROPONIN I  TROPONIN I    EKG EKG Interpretation  Date/Time:  Tuesday February 22 2018 15:36:00 EST Ventricular Rate:  62 PR Interval:  144 QRS Duration: 88 QT Interval:  387 QTC Calculation: 393 R Axis:   80 Text Interpretation:  Sinus rhythm since last tracing no significant change Confirmed by Malvin Johns [phone]) on 02/22/2018 4:24:41 PM   Radiology Dg Chest 2 View  Result Date: 02/22/2018 CLINICAL DATA:  Chest/epigastric pain EXAM: CHEST - 2 VIEW COMPARISON:  September 24, 2015 FINDINGS: Lungs  are clear. The heart size and pulmonary vascularity are normal. No adenopathy. There is mild degenerative change in the thoracic spine. No pneumothorax. IMPRESSION: No edema or consolidation. Electronically Signed   By: Lowella Grip III M.D.   On: 02/22/2018 13:59    Procedures Procedures (including critical care time)  Medications Ordered in ED Medications  alum & mag hydroxide-simeth (MAALOX/MYLANTA) 200-200-20 MG/5ML suspension 30 mL (30 mLs Oral Given 02/22/18 1541)     Initial Impression / Assessment and Plan / ED Course  I have reviewed the triage vital signs and the nursing notes.  Pertinent labs & imaging results that were available during my care of the patient were reviewed by me and considered in my medical decision making (see chart for details).     Patient presents with intermittent chest pain.  She feels like a lumen is in her lower chest wall.  Her discomfort last up to 2 to 3 minutes but usually less than a minute.  She has no associated symptoms.  No ischemic changes on EKG.  She is had 2 normal troponins.  She has a low Heart score.  Chest x-ray shows no acute abnormalities.  She was discharged home in good condition.  She is recommended to have close follow-up with her PCP or she has also been seen with a previously retired Film/video editor in [place].  She was given the contact information for [facility] health cardiology.  Return precautions were given.  Final Clinical Impressions(s) / ED Diagnoses   Final diagnoses:  Atypical chest pain    ED Discharge Orders    None       Malvin Johns, MD 02/22/18 [phone]

## 2018-02-22 NOTE — Telephone Encounter (Signed)
Pt called complaining of severe chest pain since 02/22/2018 around 0830; she has taken aspirin and pepsi; the pain is in the center of her chest and is constant; she says that she had chest pain last week that radiated to her jaw; he pt says that she is under a lot of stress due to family matters; recommendations made per nurse triage; the pt refuses disposition to go to ED; she would like to be seen in the office; further explained to pt that chest pain should be taken seriously and she should proceed to the ED for evaluation; spoke with Rachel Bo at [facility], and he agrees with previous recommendations; once again recommendation given to patient; she once again refused ED but agreed to at least go to urgent care for evaluation; pt previously seen by Dr Sherren Mocha at [facility] and has transfer of care appointment on 03/03/2018; will route to office for notification of this encounter.  Reason for Disposition . Pain also present in shoulder(s) or arm(s) or jaw  (Exception: pain is clearly made worse by movement)  Answer Assessment - Initial Assessment Questions 1. LOCATION: "Where does it hurt?"       Center of chest 2. RADIATION: "Does the pain go anywhere else?" (e.g., into neck, jaw, arms, back)     Radiating across shoulders earlier on 02/22/2018; radiating across jaw a couple of days ago 3. ONSET: "When did the chest pain begin?" (Minutes, hours or days)      02/22/18 4. PATTERN "Does the pain come and go, or has it been constant since it started?"  "Does it get worse with exertion?"      contstand 5. DURATION: "How long does it last" (e.g., seconds, minutes, hours)     hour 6. SEVERITY: "How bad is the pain?"  (e.g., Scale 1-10; mild, moderate, or severe)    - MILD (1-3): doesn't interfere with normal activities     - MODERATE (4-7): interferes with normal activities or awakens from sleep    - SEVERE (8-10): excruciating pain, unable to do any normal activities       5 out of 10 7. CARDIAC RISK  FACTORS: "Do you have any history of heart problems or risk factors for heart disease?" (e.g., prior heart attack, angina; high blood pressure, diabetes, being overweight, high cholesterol, smoking, or strong family history of heart disease)     MVP, PSVT 8. PULMONARY RISK FACTORS: "Do you have any history of lung disease?"  (e.g., blood clots in lung, asthma, emphysema, birth control pills)     no 9. CAUSE: "What do you think is causing the chest pain?"     Not sure 10. OTHER SYMPTOMS: "Do you have any other symptoms?" (e.g., dizziness, nausea, vomiting, sweating, fever, difficulty breathing, cough)       Pain radiating to jaw a few days ago  11. PREGNANCY: "Is there any chance you are pregnant?" "When was your last menstrual period?"       no  Protocols used: CHEST PAIN-A-AH

## 2018-02-23 ENCOUNTER — Ambulatory Visit: Payer: Medicare Other | Admitting: Family Medicine

## 2018-02-23 ENCOUNTER — Encounter: Payer: Self-pay | Admitting: Family Medicine

## 2018-02-23 VITALS — BP 118/70 | HR 65 | Ht 62.0 in | Wt 110.0 lb

## 2018-02-23 DIAGNOSIS — R0789 Other chest pain: Secondary | ICD-10-CM | POA: Diagnosis not present

## 2018-02-23 DIAGNOSIS — G4700 Insomnia, unspecified: Secondary | ICD-10-CM

## 2018-02-23 MED ORDER — TRAZODONE HCL 50 MG PO TABS: 25.0000 mg | ORAL_TABLET | Freq: Every evening | ORAL | 3 refills | Status: DC | PRN

## 2018-02-23 NOTE — Patient Instructions (Signed)

## 2018-02-23 NOTE — Progress Notes (Signed)
Established Patient Office Visit  Subjective:  Patient ID: [name], female    DOB: Mar 25, 1950  Age: 68 y.o. MRN: [id number]  CC:  Chief Complaint  Patient presents with  . Hospitalization Follow-up    HPI [name] presents for  hospital emergency room follow-up status post acute episode of chest pain.  Troponins EKG chest x-ray were all negative.  She was having chest pain when she was seen.  She described it as lemon pressing on her left lower chest from the inside.  There was no exertional symptomatology, shortness of breath nausea or vomiting or diaphoresis.  She has no history of hypertension or smoking.  Last recorded lipid profile was from 2 years ago with an LDL of 89 and an HDL of 66.  She does however have a history of coronary artery disease on both sides of her family.  She is accompanied by her husband today.  She is under a good deal of stress.  Her mother and father have been living with them for some time now.  Mom has recently been ill over Christmas and is now in assisted living for 20 days secondary to recent diagnoses of diverticulitis, pulmonary embolism congestive heart failure and so forth.  Dad is been not been doing well without his wife present.  Patient is stressed.  She has been taking a small amount of Ambien nightly for sleep.  Prior doctor had expressed concerns about her memory associated with Ambien usage.  He had suggested that she take melatonin and then try trazodone.  She has not tried the trazodone as of yet.  Past Medical History:  Diagnosis Date  . Anal fissure   . Anemia    during pregnancy  . Anxiety   . Basal cell carcinoma   . DDD (degenerative disc disease)   . Diverticulosis   . Hypokalemia   . LBP (low back pain)   . Melanoma ([place])   . Migraine   . MVP (mitral valve prolapse) 1990   psa  . Rosacea   . Ulcer    as a child    Past Surgical History:  Procedure Laterality Date  . ABDOMINAL HYSTERECTOMY  1985  . BREAST  BIOPSY Left    secondary to infection  . I&D EXTREMITY Right 09/09/2012   Procedure: Minor IRRIGATION AND DEBRIDEMENT EXTREMITY paronychia of right thumb;  Surgeon: Wynonia Sours, MD;  Location: [place];  Service: Orthopedics;  Laterality: Right;  . I&D EXTREMITY Left 01/26/2014   Procedure: MINOR IINCISION AND DRAINAGE paronychia left index finger;  Surgeon: Daryll Brod, MD;  Location: [place];  Service: Orthopedics;  Laterality: Left;  left index  . OOPHORECTOMY Left 1994  . REPAIR KNEE LIGAMENT Left 1989  . TONSILLECTOMY  1955  . TUBAL LIGATION    . VEIN SURGERY      Family History  Problem Relation Age of Onset  . Heart disease Mother   . Colon polyps Mother   . Heart disease Father   . Colitis Father   . Colon polyps Father   . Esophageal cancer Maternal Aunt   . Colon cancer Neg Hx     Social History   Socioeconomic History  . Marital status: Married    Spouse name: Not on file  . Number of children: 2  . Years of education: Not on file  . Highest education level: Not on file  Occupational History  . Occupation: retired Conservation officer, historic buildings  Social Needs  . Financial resource strain: Not on file  . Food insecurity:    Worry: Not on file    Inability: Not on file  . Transportation needs:    Medical: Not on file    Non-medical: Not on file  Tobacco Use  . Smoking status: Never Smoker  . Smokeless tobacco: Never Used  Substance and Sexual Activity  . Alcohol use: Yes    Alcohol/week: 1.0 standard drinks    Types: 1 Standard drinks or equivalent per week    Comment: social wine  . Drug use: No  . Sexual activity: Yes  Lifestyle  . Physical activity:    Days per week: Not on file    Minutes per session: Not on file  . Stress: Not on file  Relationships  . Social connections:    Talks on phone: Not on file    Gets together: Not on file    Attends religious service: Not on file    Active member of club or organization: Not on file      Attends meetings of clubs or organizations: Not on file    Relationship status: Not on file  . Intimate partner violence:    Fear of current or ex partner: Not on file    Emotionally abused: Not on file    Physically abused: Not on file    Forced sexual activity: Not on file  Other Topics Concern  . Not on file  Social History Narrative  . Not on file    Outpatient Medications Prior to Visit  Medication Sig Dispense Refill  . Ascorbic Acid (VITAMIN C) 100 MG tablet Take 100 mg by mouth daily.      Marland Kitchen aspirin 81 MG tablet Take 81 mg by mouth. ON OCCASION    . atenolol (TENORMIN) 25 MG tablet One half tablet when necessary for palpitations 30 tablet 4  . BIOTIN PO Take by mouth.    . calcium-vitamin D 250-100 MG-UNIT per tablet Take 1 tablet by mouth daily.      Marland Kitchen estradiol (ESTRACE) 0.5 MG tablet Take 0.5 mg by mouth daily.    . hydrocortisone (ANUSOL-HC) 2.5 % rectal cream Apply rectally at bedtime, x 3 weeks 30 g 4  . NON FORMULARY Hydro eye    . omeprazole (PRILOSEC) 20 MG capsule Take 1 capsule (20 mg total) by mouth daily. AS NEEDED 100 capsule 4  . vitamin E 100 UNIT capsule Take 100 Units by mouth daily.      Marland Kitchen zolmitriptan (ZOMIG ZMT) 2.5 MG disintegrating tablet Take 1 tablet (2.5 mg total) by mouth as needed for migraine. 10 tablet 4  . doxycycline (VIBRAMYCIN) 100 MG capsule doxycycline hyclate 100 mg capsule  TK ONE C PO BID WF FOR 7 DAYS    . traZODone (DESYREL) 50 MG tablet 1 p.o. nightly for sleep 100 tablet 3   No facility-administered medications prior to visit.     Allergies  Allergen Reactions  . Codeine   . Compazine   . Gabapentin   . Prednisone     REACTION: makes heart beat hard \\T \ fast  . Prochlorperazine Edisylate   . Sulfa Antibiotics   . Sulfonamide Derivatives     ROS Review of Systems  Constitutional: Negative.   HENT: Negative.   Eyes: Negative for photophobia and visual disturbance.  Respiratory: Negative for chest tightness,  shortness of breath and wheezing.   Cardiovascular: Negative.   Gastrointestinal: Negative.   Endocrine: Negative for polyphagia  and polyuria.  Genitourinary: Negative.   Musculoskeletal: Negative for gait problem and joint swelling.  Skin: Negative for pallor and rash.  Allergic/Immunologic: Negative for immunocompromised state.  Neurological: Negative for light-headedness, numbness and headaches.  Hematological: Does not bruise/bleed easily.  Psychiatric/Behavioral: Positive for sleep disturbance.   Depression screen [facility] 2/9 02/23/2018 02/18/2016 01/08/2015  Decreased Interest 1 0 0  Down, Depressed, Hopeless 1 0 0  PHQ - 2 Score 2 0 0  Altered sleeping 2 - -  Tired, decreased energy 2 - -  Change in appetite 0 - -  Feeling bad or failure about yourself  2 - -  Trouble concentrating 2 - -  Moving slowly or fidgety/restless 0 - -  Suicidal thoughts 0 - -  PHQ-9 Score 10 - -      Objective:    Physical Exam  Constitutional: She is oriented to person, place, and time. She appears well-developed and well-nourished. No distress.  HENT:  Head: Normocephalic and atraumatic.  Right Ear: External ear normal.  Left Ear: External ear normal.  Eyes: Conjunctivae are normal. Right eye exhibits no discharge. Left eye exhibits no discharge. No scleral icterus.  Neck: No JVD present. No tracheal deviation present.  Cardiovascular: Normal rate, regular rhythm and normal heart sounds.  Pulmonary/Chest: Effort normal and breath sounds normal. No stridor. No respiratory distress. She has no wheezes. She has no rales.  Musculoskeletal:        General: No edema.  Neurological: She is alert and oriented to person, place, and time.  Skin: Skin is warm and dry. She is not diaphoretic.  Psychiatric: She has a normal mood and affect. Her behavior is normal.    BP 118/70   Pulse 65   Ht 5\' 2"  (1.575 m)   Wt 110 lb 0.2 oz (49.9 kg)   SpO2 98%   BMI 20.12 kg/m  Wt Readings from Last 3  Encounters:  02/23/18 110 lb 0.2 oz (49.9 kg)  02/22/18 110 lb (49.9 kg)  12/21/17 117 lb (53.1 kg)   BP Readings from Last 3 Encounters:  02/23/18 118/70  02/22/18 129/69  12/21/17 102/60   Guideline developer:  UpToDate (see UpToDate for funding source) Date Released: June 2014  Health Maintenance Due  Topic Date Due  . COLONOSCOPY  05/31/2017    There are no preventive care reminders to display for this patient.  Lab Results  Component Value Date   TSH 1.50 02/24/2017   Lab Results  Component Value Date   WBC 8.9 02/22/2018   HGB 12.9 02/22/2018   HCT 41.3 02/22/2018   MCV 98.1 02/22/2018   PLT 262 02/22/2018   Lab Results  Component Value Date   NA 137 02/22/2018   K 3.7 02/22/2018   CO2 26 02/22/2018   GLUCOSE 123 (H) 02/22/2018   BUN 15 02/22/2018   CREATININE 0.77 02/22/2018   BILITOT 0.4 01/30/2016   ALKPHOS 56 01/30/2016   AST 16 01/30/2016   ALT 13 01/30/2016   PROT 7.5 01/30/2016   ALBUMIN 4.3 01/30/2016   CALCIUM 10.4 (H) 02/22/2018   ANIONGAP 8 02/22/2018   GFR 92.91 02/24/2017   Lab Results  Component Value Date   CHOL 173 01/30/2016   Lab Results  Component Value Date   HDL 66.30 01/30/2016   Lab Results  Component Value Date   LDLCALC 89 01/30/2016   Lab Results  Component Value Date   TRIG 89.0 01/30/2016   Lab Results  Component Value Date  CHOLHDL 3 01/30/2016   No results found for: HGBA1C    Assessment & Plan:   Problem List Items Addressed This Visit      Other   Insomnia - Primary   Relevant Medications   traZODone (DESYREL) 50 MG tablet   Other chest pain   Relevant Orders   ECHOCARDIOGRAM STRESS TEST      Meds ordered this encounter  Medications  . traZODone (DESYREL) 50 MG tablet    Sig: Take 0.5-1 tablets (25-50 mg total) by mouth at bedtime as needed for sleep.    Dispense:  30 tablet    Refill:  3    Follow-up: No follow-ups on file.  Patient is stressed.  I believe that is the origin of most  of her symptoms.  She did score a 10 on the PHQ 9 for depression.  She said that most of her answers related to the last few weeks when her mother is been so ill.  We will go ahead and send for stress echo test.  Anticipating normal results with her particular past medical history she will follow-up with me for a physical in the next week or so.

## 2018-02-24 ENCOUNTER — Telehealth: Payer: Self-pay

## 2018-02-24 NOTE — Telephone Encounter (Signed)
Copied from [place] [id number]. Topic: General - Other >> Feb 24, 2018  3:09 PM Rayann Heman wrote: Reason for CRM: pt called and would like to know the status of referral for stress test. Please advise

## 2018-03-01 ENCOUNTER — Encounter: Payer: Self-pay | Admitting: Plastic Surgery

## 2018-03-01 ENCOUNTER — Ambulatory Visit (INDEPENDENT_AMBULATORY_CARE_PROVIDER_SITE_OTHER): Payer: Medicare Other | Admitting: Plastic Surgery

## 2018-03-01 VITALS — BP 119/65 | HR 60 | Temp 97.8°F | Ht 62.0 in | Wt 110.0 lb

## 2018-03-01 DIAGNOSIS — Z719 Counseling, unspecified: Secondary | ICD-10-CM

## 2018-03-01 MED ORDER — LIDOCAINE-PRILOCAINE 2.5-2.5 % EX CREA: 1.0000 "application " | TOPICAL_CREAM | CUTANEOUS | 0 refills | Status: DC | PRN

## 2018-03-01 NOTE — Progress Notes (Signed)
   Subjective:    Patient ID: [name], female    DOB: 08/28/1950, 68 y.o.   MRN: [id number]  The patient is a 68 year old white female here with her husband for evaluation for facial rejuvenation.  She is dealing with a lot of stress right now with her parents and their aging process in failing health.  We had a long discussion about options for laser.  She is going to wait to not do it today.  But she does want to try some skin tightening with the NdYag.   Review of Systems  Constitutional: Negative.   HENT: Negative.   Eyes: Negative.   Respiratory: Negative.   Gastrointestinal: Negative.   Endocrine: Negative.   Genitourinary: Negative.   Musculoskeletal: Negative.   Skin: Negative.        Objective:   Physical Exam Vitals signs and nursing note reviewed.  Constitutional:      Appearance: Normal appearance.  HENT:     Head: Normocephalic and atraumatic.  Cardiovascular:     Rate and Rhythm: Normal rate.  Pulmonary:     Effort: Pulmonary effort is normal.  Skin:    General: Skin is warm.  Neurological:     Mental Status: She is alert.  Psychiatric:        Mood and Affect: Mood normal.        Thought Content: Thought content normal.        Judgment: Judgment normal.       Assessment & Plan:  Encounter for counseling I will call in EMLA cream and we will plan on a trial with the NdYag.

## 2018-03-02 NOTE — Telephone Encounter (Signed)
Patient is scheduled and aware of appointment

## 2018-03-03 ENCOUNTER — Ambulatory Visit: Payer: Medicare Other | Admitting: Family Medicine

## 2018-03-03 ENCOUNTER — Encounter: Payer: Self-pay | Admitting: Family Medicine

## 2018-03-03 ENCOUNTER — Telehealth (HOSPITAL_COMMUNITY): Payer: Self-pay

## 2018-03-03 VITALS — BP 110/64 | HR 87 | Ht 62.0 in | Wt 113.0 lb

## 2018-03-03 DIAGNOSIS — G4700 Insomnia, unspecified: Secondary | ICD-10-CM

## 2018-03-03 DIAGNOSIS — K644 Residual hemorrhoidal skin tags: Secondary | ICD-10-CM | POA: Diagnosis not present

## 2018-03-03 DIAGNOSIS — Z0000 Encounter for general adult medical examination without abnormal findings: Secondary | ICD-10-CM | POA: Diagnosis not present

## 2018-03-03 DIAGNOSIS — I4949 Other premature depolarization: Secondary | ICD-10-CM | POA: Diagnosis not present

## 2018-03-03 DIAGNOSIS — Z1211 Encounter for screening for malignant neoplasm of colon: Secondary | ICD-10-CM

## 2018-03-03 LAB — URINALYSIS, ROUTINE W REFLEX MICROSCOPIC
Bilirubin Urine: NEGATIVE
Hgb urine dipstick: NEGATIVE
Ketones, ur: NEGATIVE
Leukocytes, UA: NEGATIVE
Nitrite: NEGATIVE
RBC / HPF: NONE SEEN (ref 0–?)
Specific Gravity, Urine: 1.015 (ref 1.000–1.030)
Total Protein, Urine: NEGATIVE
Urine Glucose: NEGATIVE
Urobilinogen, UA: 0.2 (ref 0.0–1.0)
pH: 5.5 (ref 5.0–8.0)

## 2018-03-03 MED ORDER — ATENOLOL 25 MG PO TABS: ORAL_TABLET | ORAL | 4 refills | Status: DC

## 2018-03-03 MED ORDER — HYDROCORTISONE 2.5 % RE CREA: TOPICAL_CREAM | RECTAL | 2 refills | Status: DC

## 2018-03-03 NOTE — Patient Instructions (Signed)
Health Maintenance After Age 70 After age 70, you are at a higher risk for certain long-term diseases and infections as well as injuries from falls. Falls are a major cause of broken bones and head injuries in people who are older than age 70. Getting regular preventive care can help to keep you healthy and well. Preventive care includes getting regular testing and making lifestyle changes as recommended by your health care provider. Talk with your health care provider about:  Which screenings and tests you should have. A screening is a test that checks for a disease when you have no symptoms.  A diet and exercise plan that is right for you. What should I know about screenings and tests to prevent falls? Screening and testing are the best ways to find a health problem early. Early diagnosis and treatment give you the best chance of managing medical conditions that are common after age 70. Certain conditions and lifestyle choices may make you more likely to have a fall. Your health care provider may recommend:  Regular vision checks. Poor vision and conditions such as cataracts can make you more likely to have a fall. If you wear glasses, make sure to get your prescription updated if your vision changes.  Medicine review. Work with your health care provider to regularly review all of the medicines you are taking, including over-the-counter medicines. Ask your health care provider about any side effects that may make you more likely to have a fall. Tell your health care provider if any medicines that you take make you feel dizzy or sleepy.  Osteoporosis screening. Osteoporosis is a condition that causes the bones to get weaker. This can make the bones weak and cause them to break more easily.  Blood pressure screening. Blood pressure changes and medicines to control blood pressure can make you feel dizzy.  Strength and balance checks. Your health care provider may recommend certain tests to check your  strength and balance while standing, walking, or changing positions.  Foot health exam. Foot pain and numbness, as well as not wearing proper footwear, can make you more likely to have a fall.  Depression screening. You may be more likely to have a fall if you have a fear of falling, feel emotionally low, or feel unable to do activities that you used to do.  Alcohol use screening. Using too much alcohol can affect your balance and may make you more likely to have a fall. What actions can I take to lower my risk of falls? General instructions  Talk with your health care provider about your risks for falling. Tell your health care provider if: ? You fall. Be sure to tell your health care provider about all falls, even ones that seem minor. ? You feel dizzy, sleepy, or off-balance.  Take over-the-counter and prescription medicines only as told by your health care provider. These include any supplements.  Eat a healthy diet and maintain a healthy weight. A healthy diet includes low-fat dairy products, low-fat (lean) meats, and fiber from whole grains, beans, and lots of fruits and vegetables. Home safety  Remove any tripping hazards, such as rugs, cords, and clutter.  Install safety equipment such as grab bars in bathrooms and safety rails on stairs.  Keep rooms and walkways well-lit. Activity   Follow a regular exercise program to stay fit. This will help you maintain your balance. Ask your health care provider what types of exercise are appropriate for you.  If you need a cane or   walker, use it as recommended by your health care provider.  Wear supportive shoes that have nonskid soles. Lifestyle  Do not drink alcohol if your health care provider tells you not to drink.  If you drink alcohol, limit how much you have: ? 0-1 drink a day for women. ? 0-2 drinks a day for men.  Be aware of how much alcohol is in your drink. In the [place] one drink equals one typical bottle of beer (12  oz), one-half glass of wine (5 oz), or one shot of hard liquor (1 oz).  Do not use any products that contain nicotine or tobacco, such as cigarettes and e-cigarettes. If you need help quitting, ask your health care provider. Summary  Having a healthy lifestyle and getting preventive care can help to protect your health and wellness after age 70.  Screening and testing are the best way to find a health problem early and help you avoid having a fall. Early diagnosis and treatment give you the best chance for managing medical conditions that are more common for people who are older than age 70.  Falls are a major cause of broken bones and head injuries in people who are older than age 70. Take precautions to prevent a fall at home.  Work with your health care provider to learn what changes you can make to improve your health and wellness and to prevent falls. This information is not intended to replace advice given to you by your health care provider. Make sure you discuss any questions you have with your health care provider. Document Released: 12/09/2016 Document Revised: 12/09/2016 Document Reviewed: 12/09/2016 Elsevier Interactive Patient Education  2019 Elsevier Inc.  

## 2018-03-03 NOTE — Progress Notes (Addendum)
Established Patient Office Visit  Subjective:  Patient ID: [name], female    DOB: 1950-10-02  Age: 68 y.o. MRN: [id number]  CC:  Chief Complaint  Patient presents with  . Annual Exam    HPI [name] presents for   Past Medical History:  Diagnosis Date  . Anal fissure   . Anemia    during pregnancy  . Anxiety   . Basal cell carcinoma   . DDD (degenerative disc disease)   . Diverticulosis   . Hypokalemia   . LBP (low back pain)   . Melanoma ([place])   . Migraine   . MVP (mitral valve prolapse) 1990   psa  . Rosacea   . Ulcer    as a child    Past Surgical History:  Procedure Laterality Date  . ABDOMINAL HYSTERECTOMY  1985  . BREAST BIOPSY Left    secondary to infection  . I&D EXTREMITY Right 09/09/2012   Procedure: Minor IRRIGATION AND DEBRIDEMENT EXTREMITY paronychia of right thumb;  Surgeon: Wynonia Sours, MD;  Location: [facility];  Service: Orthopedics;  Laterality: Right;  . I&D EXTREMITY Left 01/26/2014   Procedure: MINOR IINCISION AND DRAINAGE paronychia left index finger;  Surgeon: Daryll Brod, MD;  Location: [place];  Service: Orthopedics;  Laterality: Left;  left index  . OOPHORECTOMY Left 1994  . REPAIR KNEE LIGAMENT Left 1989  . TONSILLECTOMY  1955  . TUBAL LIGATION    . VEIN SURGERY      Family History  Problem Relation Age of Onset  . Heart disease Mother   . Colon polyps Mother   . Heart disease Father   . Colitis Father   . Colon polyps Father   . Esophageal cancer Maternal Aunt   . Colon cancer Neg Hx     Social History   Socioeconomic History  . Marital status: Married    Spouse name: Not on file  . Number of children: 2  . Years of education: Not on file  . Highest education level: Not on file  Occupational History  . Occupation: retired Chief of Staff  . Financial resource strain: Not on file  . Food insecurity:    Worry: Not on file    Inability: Not on file  .  Transportation needs:    Medical: Not on file    Non-medical: Not on file  Tobacco Use  . Smoking status: Never Smoker  . Smokeless tobacco: Never Used  Substance and Sexual Activity  . Alcohol use: Yes    Alcohol/week: 1.0 standard drinks    Types: 1 Standard drinks or equivalent per week    Comment: social wine  . Drug use: No  . Sexual activity: Yes  Lifestyle  . Physical activity:    Days per week: Not on file    Minutes per session: Not on file  . Stress: Not on file  Relationships  . Social connections:    Talks on phone: Not on file    Gets together: Not on file    Attends religious service: Not on file    Active member of club or organization: Not on file    Attends meetings of clubs or organizations: Not on file    Relationship status: Not on file  . Intimate partner violence:    Fear of current or ex partner: Not on file    Emotionally abused: Not on file    Physically abused: Not  on file    Forced sexual activity: Not on file  Other Topics Concern  . Not on file  Social History Narrative  . Not on file    Outpatient Medications Prior to Visit  Medication Sig Dispense Refill  . Ascorbic Acid (VITAMIN C) 100 MG tablet Take 100 mg by mouth daily.      Marland Kitchen aspirin 81 MG tablet Take 81 mg by mouth. ON OCCASION    . BIOTIN PO Take by mouth.    . calcium-vitamin D 250-100 MG-UNIT per tablet Take 1 tablet by mouth daily.      Marland Kitchen estradiol (ESTRACE) 0.5 MG tablet Take 0.5 mg by mouth daily.    Marland Kitchen lidocaine-prilocaine (EMLA) cream Apply 1 application topically as needed. Apply to face 30 minutes prior to procedure 30 g 0  . NON FORMULARY Hydro eye    . omeprazole (PRILOSEC) 20 MG capsule Take 1 capsule (20 mg total) by mouth daily. AS NEEDED 100 capsule 4  . vitamin E 100 UNIT capsule Take 100 Units by mouth daily.      Marland Kitchen zolmitriptan (ZOMIG ZMT) 2.5 MG disintegrating tablet Take 1 tablet (2.5 mg total) by mouth as needed for migraine. 10 tablet 4  . atenolol (TENORMIN)  25 MG tablet One half tablet when necessary for palpitations 30 tablet 4  . hydrocortisone (ANUSOL-HC) 2.5 % rectal cream Apply rectally at bedtime, x 3 weeks 30 g 4   No facility-administered medications prior to visit.     Allergies  Allergen Reactions  . Codeine   . Compazine   . Gabapentin   . Prednisone     REACTION: makes heart beat hard \\T \ fast  . Prochlorperazine Edisylate   . Sulfa Antibiotics   . Sulfonamide Derivatives     ROS Review of Systems  Constitutional: Negative.   HENT: Negative.   Eyes: Negative.   Respiratory: Negative.   Cardiovascular: Negative.   Gastrointestinal: Negative.   Genitourinary: Negative.   Musculoskeletal: Positive for arthralgias. Negative for gait problem.  Allergic/Immunologic: Negative for immunocompromised state.  Neurological: Negative.   Hematological: Does not bruise/bleed easily.  Psychiatric/Behavioral: Positive for sleep disturbance.      Objective:    Physical Exam  Constitutional: She is oriented to person, place, and time. She appears well-developed and well-nourished. No distress.  HENT:  Head: Normocephalic and atraumatic.  Right Ear: External ear normal.  Left Ear: External ear normal.  Mouth/Throat: Oropharynx is clear and moist. No oropharyngeal exudate.  Eyes: Pupils are equal, round, and reactive to light. Conjunctivae are normal. Right eye exhibits no discharge. Left eye exhibits no discharge. No scleral icterus.  Neck: Neck supple. No JVD present. No tracheal deviation present. No thyromegaly present.  Cardiovascular: Normal rate, regular rhythm and normal heart sounds.  Pulmonary/Chest: Effort normal and breath sounds normal. No stridor. Right breast exhibits no inverted nipple, no mass, no nipple discharge, no skin change and no tenderness. Left breast exhibits no inverted nipple, no mass, no nipple discharge, no skin change and no tenderness. Breasts are symmetrical.  Abdominal: Soft. Bowel sounds are  normal. She exhibits no mass. There is no abdominal tenderness. There is no rebound and no guarding.  Musculoskeletal:     Right knee: She exhibits normal range of motion, no swelling, no effusion, no ecchymosis and no deformity. Tenderness found. Medial joint line tenderness noted.  Lymphadenopathy:    She has no cervical adenopathy.  Neurological: She is alert and oriented to person, place, and time.  Skin: Skin is warm and dry. No rash noted. She is not diaphoretic. No erythema. No pallor.  Psychiatric: She has a normal mood and affect. Her behavior is normal.    BP 110/64   Pulse 87   Ht 5\' 2"  (1.575 m)   Wt 113 lb (51.3 kg)   SpO2 98%   BMI 20.67 kg/m  Wt Readings from Last 3 Encounters:  03/03/18 113 lb (51.3 kg)  03/01/18 110 lb (49.9 kg)  02/23/18 110 lb 0.2 oz (49.9 kg)   BP Readings from Last 3 Encounters:  03/03/18 110/64  03/01/18 119/65  02/23/18 118/70   Guideline developer:  UpToDate (see UpToDate for funding source) Date Released: June 2014  Health Maintenance Due  Topic Date Due  . COLONOSCOPY  05/31/2017    There are no preventive care reminders to display for this patient.  Lab Results  Component Value Date   TSH 1.50 02/24/2017   Lab Results  Component Value Date   WBC 7.8 03/07/2018   HGB 13.2 03/07/2018   HCT 39.3 03/07/2018   MCV 94.9 03/07/2018   PLT 263.0 03/07/2018   Lab Results  Component Value Date   NA 139 03/07/2018   K 3.6 03/07/2018   CO2 31 03/07/2018   GLUCOSE 105 (H) 03/07/2018   BUN 15 03/07/2018   CREATININE 0.72 03/07/2018   BILITOT 0.6 03/07/2018   ALKPHOS 59 03/07/2018   AST 15 03/07/2018   ALT 11 03/07/2018   PROT 7.1 03/07/2018   ALBUMIN 4.3 03/07/2018   CALCIUM 10.0 03/07/2018   ANIONGAP 8 02/22/2018   GFR 80.20 03/07/2018   Lab Results  Component Value Date   CHOL 177 03/07/2018   Lab Results  Component Value Date   HDL 70.50 03/07/2018   Lab Results  Component Value Date   LDLCALC 76 03/07/2018     Lab Results  Component Value Date   TRIG 156.0 (H) 03/07/2018   Lab Results  Component Value Date   CHOLHDL 3 03/07/2018   No results found for: HGBA1C    Assessment & Plan:   Problem List Items Addressed This Visit      Cardiovascular and Mediastinum   Premature beat   Relevant Medications   atenolol (TENORMIN) 25 MG tablet   External hemorrhoids   Relevant Medications   atenolol (TENORMIN) 25 MG tablet   hydrocortisone (ANUSOL-HC) 2.5 % rectal cream     Other   Insomnia - Primary   Screen for colon cancer   Relevant Orders   Ambulatory referral to Gastroenterology      Meds ordered this encounter  Medications  . atenolol (TENORMIN) 25 MG tablet    Sig: One half tablet when necessary for palpitations    Dispense:  30 tablet    Refill:  4  . DISCONTD: hydrocortisone (ANUSOL-HC) 2.5 % rectal cream    Sig: Apply rectally at bedtime, x 3 weeks    Dispense:  30 g    Refill:  2  . hydrocortisone (ANUSOL-HC) 2.5 % rectal cream    Sig: Apply rectally at bedtime, x 3 weeks    Dispense:  30 g    Refill:  2    Follow-up: No follow-ups on file.   Patient will get the trazodone and try.  Information was given for health maintenance and disease prevention.  She has already scheduled her mammogram.  She will follow-up for her colonoscopy with her GI provider.  She is also being seen by GYN for Pap  and pelvic exams.  She is also seeing dermatology for skin checks.  Patient is taking ibuprofen for knee pain and that is helped.  Follow-up

## 2018-03-03 NOTE — Telephone Encounter (Signed)
Contacted pt and and instructions given for her stress echo test. Patient stated that she would be here. S.Audrea Bolte EMTP

## 2018-03-07 ENCOUNTER — Other Ambulatory Visit (INDEPENDENT_AMBULATORY_CARE_PROVIDER_SITE_OTHER): Payer: Medicare Other

## 2018-03-07 DIAGNOSIS — Z0000 Encounter for general adult medical examination without abnormal findings: Secondary | ICD-10-CM | POA: Diagnosis not present

## 2018-03-07 LAB — LIPID PANEL
Cholesterol: 177 mg/dL (ref 0–200)
HDL: 70.5 mg/dL (ref >39.00)
LDL Cholesterol: 76 mg/dL (ref 0–99)
NonHDL: 106.83
Total CHOL/HDL Ratio: 3
Triglycerides: 156 mg/dL — ABNORMAL HIGH (ref 0.0–149.0)
VLDL: 31.2 mg/dL (ref 0.0–40.0)

## 2018-03-07 LAB — CBC
HCT: 39.3 % (ref 36.0–46.0)
Hemoglobin: 13.2 g/dL (ref 12.0–15.0)
MCHC: 33.6 g/dL (ref 30.0–36.0)
MCV: 94.9 fl (ref 78.0–100.0)
PLATELETS: 263 10*3/uL (ref 150.0–400.0)
RBC: 4.15 Mil/uL (ref 3.87–5.11)
RDW: 12.7 % (ref 11.5–15.5)
WBC: 7.8 10*3/uL (ref 4.0–10.5)

## 2018-03-07 LAB — COMPREHENSIVE METABOLIC PANEL
ALT: 11 U/L (ref 0–35)
AST: 15 U/L (ref 0–37)
Albumin: 4.3 g/dL (ref 3.5–5.2)
Alkaline Phosphatase: 59 U/L (ref 39–117)
BILIRUBIN TOTAL: 0.6 mg/dL (ref 0.2–1.2)
BUN: 15 mg/dL (ref 6–23)
CO2: 31 meq/L (ref 19–32)
Calcium: 10 mg/dL (ref 8.4–10.5)
Chloride: 100 mEq/L (ref 96–112)
Creatinine, Ser: 0.72 mg/dL (ref 0.40–1.20)
GFR: 80.2 mL/min (ref >60.00)
Glucose, Bld: 105 mg/dL — ABNORMAL HIGH (ref 70–99)
Potassium: 3.6 mEq/L (ref 3.5–5.1)
Sodium: 139 mEq/L (ref 135–145)
Total Protein: 7.1 g/dL (ref 6.0–8.3)

## 2018-03-07 LAB — VITAMIN D 25 HYDROXY (VIT D DEFICIENCY, FRACTURES): VITD: 37.4 ng/mL (ref 30.00–100.00)

## 2018-03-08 ENCOUNTER — Other Ambulatory Visit (HOSPITAL_COMMUNITY): Payer: Medicare Other

## 2018-03-08 ENCOUNTER — Ambulatory Visit (HOSPITAL_COMMUNITY): Payer: Medicare Other | Attending: Cardiology

## 2018-03-08 ENCOUNTER — Ambulatory Visit (HOSPITAL_COMMUNITY): Payer: Medicare Other

## 2018-03-08 DIAGNOSIS — R0789 Other chest pain: Secondary | ICD-10-CM | POA: Insufficient documentation

## 2018-03-09 ENCOUNTER — Encounter: Payer: Medicare Other | Admitting: Family Medicine

## 2018-03-18 ENCOUNTER — Telehealth: Payer: Self-pay | Admitting: Family Medicine

## 2018-03-18 NOTE — Telephone Encounter (Signed)
Copied from [place] [phone]. Topic: Quick Communication - See Telephone Encounter >> Mar 18, 2018 12:19 PM Rutherford Nail, NT wrote: CRM for notification. See Telephone encounter for: 03/18/18. Patient calling and states that Dr Ethelene Hal mentioned to her it was time for a colonoscopy. States that she spoke with Dr Earlean Shawl and he advised to have Dr Ethelene Hal send over the information and they would call her to schedule. Patient also states that she had a stress test done a few days ago and has not heard about those results either. Please advise.

## 2018-03-18 NOTE — Addendum Note (Signed)
Addended by: Jon Billings on: 03/18/2018 02:40 PM   Modules accepted: Orders

## 2018-03-18 NOTE — Telephone Encounter (Signed)
Stress echo was normal.  Have referred for colonoscopy with Dr. Earlean Shawl.

## 2018-03-18 NOTE — Telephone Encounter (Signed)
I called and spoke with patient. We went over information below & nothing further needed at this time.

## 2018-03-23 ENCOUNTER — Telehealth: Payer: Self-pay

## 2018-03-23 NOTE — Telephone Encounter (Signed)
Copied from [place] [phone] [phone]. Topic: Referral - Status >> Mar 23, 2018  8:42 AM Alanda Slim E wrote:  Reason for CRM: Pt is waiting status of referral to Dr. Earlean Shawl please advise

## 2018-03-24 NOTE — Telephone Encounter (Signed)
See message below °

## 2018-03-25 NOTE — Telephone Encounter (Signed)
Send referral to Dr. Liliane Channel office. They will contact the patient to schedule appt.

## 2018-04-12 ENCOUNTER — Ambulatory Visit (INDEPENDENT_AMBULATORY_CARE_PROVIDER_SITE_OTHER): Payer: Self-pay | Admitting: Plastic Surgery

## 2018-04-12 ENCOUNTER — Encounter: Payer: Self-pay | Admitting: Plastic Surgery

## 2018-04-12 VITALS — BP 127/74 | HR 87 | Temp 97.6°F | Ht 62.0 in | Wt 114.6 lb

## 2018-04-12 DIAGNOSIS — Z719 Counseling, unspecified: Secondary | ICD-10-CM

## 2018-04-12 NOTE — Progress Notes (Signed)
Preoperative Dx: Hyperpigmentation of face  Postoperative Dx:  same  Procedure: laser to face  Anesthesia: EMLA  Description of Procedure:  Risks and complications were explained to the patient. Consent was confirmed and signed. Time out was called and all information was confirmed to be correct. The area  area was prepped with alcohol and wiped dry. The [id number] laser was set at 8.6 J/cm2. The face was lasered. The patient tolerated the procedure well and there were no complications. The patient is to follow up in 4 weeks.

## 2018-04-13 ENCOUNTER — Other Ambulatory Visit: Payer: Self-pay | Admitting: Family Medicine

## 2018-04-13 ENCOUNTER — Ambulatory Visit: Payer: Self-pay

## 2018-04-13 DIAGNOSIS — R110 Nausea: Secondary | ICD-10-CM

## 2018-04-13 MED ORDER — ONDANSETRON HCL 4 MG PO TABS: 4.0000 mg | ORAL_TABLET | Freq: Three times a day (TID) | ORAL | 0 refills | Status: DC | PRN

## 2018-04-13 NOTE — Telephone Encounter (Signed)
Patient is aware that Rx was sent in.

## 2018-04-13 NOTE — Telephone Encounter (Signed)
Copied from [place] [id number]. Topic: Quick Communication - Rx Refill/Question >> Apr 13, 2018 12:08 PM Gustavus Messing wrote: Medication: Zofran   Has the patient contacted their pharmacy? No. (Agent: If no, request that the patient contact the pharmacy for the refill.) She has never had this prescription before. Had to go to the ED last night because of diarrhea and vomiting. They gave her Zofran at the hospital and it worked. Since returning home she has become nauseous again. Only wants 5 pills because she is such a small woman.  Preferred Pharmacy (with phone number or street name): [facility] [place], [address] AT [address]  Agent: Please be advised that RX refills may take up to 3 business days. We ask that you follow-up with your pharmacy.

## 2018-04-13 NOTE — Telephone Encounter (Signed)
See triage note for 04/13/18

## 2018-04-13 NOTE — Telephone Encounter (Signed)
Pt. Requesting Rx for Zofran.  Stated she had onset of nausea/ vomiting, and diarrhea at 9:00 PM, 3/3.  Reported the diarrhea was loose orange/brown stool that changed to watery stool.  Reported she had "continuous" diarrhea, and made approx. 6 different trips to the [facility].  Also reported vomiting approx. 6 times.  Denied fever.  C/o headache.  Called EMS at 5:30 AM; reported they treated her with 1 liter of IV fluids, and gave her IV Zofran at home. (pt. stated she did not go to ER, as previous note indicated)  Is requesting small quantity of Zofran.  Reported she thinks she is starting to feel some better.  Has been sipping on hot tea, broth, and eating jello.  Last diarrhea stool and vomiting was early AM, about the time that EMS came to her home. Stated she does not want to come to the office today, as she feels weak.  Reported having normal urine output, since she rec'd IV fluids.  Advised will send Triage note to the office for further recommendations.  Advised to call back if her symptoms worsen; reviewed signs of dehydration with pt. Verb. Understanding.        Reason for Disposition . [1] SEVERE diarrhea (e.g., 7 or more times / day more than normal) AND [2] present > 24 hours (1 day)    Reported onset of diarrhea last night at 9:00 PM; c/o severe diarrhea and vomiting; EMS was called at 5:30 AM; gave pt. IV fluids and Zofran for nausea/ vomiting.  Answer Assessment - Initial Assessment Questions 1. DIARRHEA SEVERITY: "How bad is the diarrhea?" "How many extra stools have you had in the past 24 hours than normal?"    - NO DIARRHEA (SCALE 0)   - MILD (SCALE 1-3): Few loose or mushy BMs; increase of 1-3 stools over normal daily number of stools; mild increase in ostomy output.   -  MODERATE (SCALE 4-7): Increase of 4-6 stools daily over normal; moderate increase in ostomy output. * SEVERE (SCALE 8-10; OR 'WORST POSSIBLE'): Increase of 7 or more stools daily over normal; moderate increase in ostomy  output; incontinence.     At least 6 trips to [facility] with continuous diarrhea 2. ONSET: "When did the diarrhea begin?"      Last night about 9:00 PM 3. BM CONSISTENCY: "How loose or watery is the diarrhea?"      Very loose orange brown stool that changed to watery fluid 4. VOMITING: "Are you also vomiting?" If so, ask: "How many times in the past 24 hours?"      About 6 times  5. ABDOMINAL PAIN: "Are you having any abdominal pain?" If yes: "What does it feel like?" (e.g., crampy, dull, intermittent, constant)      Lower abdominal cramping  6. ABDOMINAL PAIN SEVERITY: If present, ask: "How bad is the pain?"  (e.g., Scale 1-10; mild, moderate, or severe)   - MILD (1-3): doesn't interfere with normal activities, abdomen soft and not tender to touch    - MODERATE (4-7): interferes with normal activities or awakens from sleep, tender to touch    - SEVERE (8-10): excruciating pain, doubled over, unable to do any normal activities       Intermittent cramping mild to severe at times  7. ORAL INTAKE: If vomiting, "Have you been able to drink liquids?" "How much fluids have you had in the past 24 hours?"     Hot tea, jello and broth 8. HYDRATION: "Any signs of dehydration?" (e.g.,  dry mouth [not just dry lips], too weak to stand, dizziness, new weight loss) "When did you last urinate?"     One bag of IV fluids by EMS @ 5:30 AM; overall weakness; is urinating in normal amts now.    9. EXPOSURE: "Have you traveled to a foreign country recently?" "Have you been exposed to anyone with diarrhea?" "Could you have eaten any food that was spoiled?"     Denied foreign travel; denied exposure to GI virus 10. ANTIBIOTIC USE: "Are you taking antibiotics now or have you taken antibiotics in the past 2 months?"       Takes maintenance dose of Doxycycline, 1/2 tab daily  11. OTHER SYMPTOMS: "Do you have any other symptoms?" (e.g., fever, blood in stool)       Denied fever, c/o nausea, headache, weakness 12. PREGNANCY:  "Is there any chance you are pregnant?" "When was your last menstrual period?"      N/a  Protocols used: DIARRHEA-A-AH

## 2018-04-13 NOTE — Telephone Encounter (Signed)
Patient calling and states that she called the pharmacy and they do not have the prescription for this patient. Would like to know if the zofran could be called in to the pharmacy again? Please advise.  [place], [address] AT [address]

## 2018-04-13 NOTE — Addendum Note (Signed)
Addended by: Abelino Derrick A on: 04/13/2018 01:05 PM   Modules accepted: Orders

## 2018-04-14 MED ORDER — ONDANSETRON HCL 4 MG PO TABS: 4.0000 mg | ORAL_TABLET | Freq: Three times a day (TID) | ORAL | 0 refills | Status: DC | PRN

## 2018-05-10 ENCOUNTER — Ambulatory Visit (INDEPENDENT_AMBULATORY_CARE_PROVIDER_SITE_OTHER): Payer: Self-pay | Admitting: Plastic Surgery

## 2018-05-10 ENCOUNTER — Encounter: Payer: Self-pay | Admitting: Plastic Surgery

## 2018-05-10 ENCOUNTER — Other Ambulatory Visit: Payer: Self-pay

## 2018-05-10 VITALS — BP 138/71 | HR 77 | Temp 97.8°F | Ht 62.0 in | Wt 115.0 lb

## 2018-05-10 DIAGNOSIS — Z719 Counseling, unspecified: Secondary | ICD-10-CM

## 2018-05-10 NOTE — Progress Notes (Signed)
Preoperative Dx: skin laxity  Postoperative Dx:  same  Procedure: laser to face   Anesthesia: none  Description of Procedure:  Risks and complications were explained to the patient. Consent was confirmed and signed. Time out was called and all information was confirmed to be correct. The area  area was prepped with alcohol and wiped dry. The Frax laser was set at 38 energy/MTZ with 25% coverage. The face was lasered. The patient tolerated the procedure well and there were no complications. The patient is to follow up in 4 weeks.

## 2018-05-30 MED ORDER — LIDOCAINE-PRILOCAINE 2.5-2.5 % EX CREA: 1.0000 "application " | TOPICAL_CREAM | CUTANEOUS | 0 refills | Status: DC | PRN

## 2018-05-30 NOTE — Addendum Note (Signed)
Addended by: [place] Going on: 05/30/2018 02:57 PM   Modules accepted: Orders

## 2018-06-07 ENCOUNTER — Other Ambulatory Visit: Payer: Self-pay

## 2018-06-07 ENCOUNTER — Encounter: Payer: Self-pay | Admitting: Plastic Surgery

## 2018-06-07 ENCOUNTER — Ambulatory Visit (INDEPENDENT_AMBULATORY_CARE_PROVIDER_SITE_OTHER): Payer: Self-pay | Admitting: Plastic Surgery

## 2018-06-07 VITALS — BP 128/73 | HR 85 | Temp 97.8°F | Ht 62.0 in | Wt 110.0 lb

## 2018-06-07 DIAGNOSIS — Z719 Counseling, unspecified: Secondary | ICD-10-CM

## 2018-06-07 NOTE — Progress Notes (Signed)
Preoperative Dx: skin laxity  Postoperative Dx:  same  Procedure: laser to face   Anesthesia: none  Description of Procedure:  Risks and complications were explained to the patient. Consent was confirmed and signed. Time out was called and all information was confirmed to be correct. The area  area was prepped with alcohol and wiped dry. The FRAX laser was set at 40 J/cm2. The face was lasered. The patient tolerated the procedure well and there were no complications. The patient is to follow up in 4 weeks.

## 2018-07-05 ENCOUNTER — Ambulatory Visit: Payer: Medicare Other | Admitting: Plastic Surgery

## 2018-10-27 ENCOUNTER — Telehealth: Payer: Self-pay

## 2018-10-27 NOTE — Telephone Encounter (Signed)

## 2018-10-28 ENCOUNTER — Encounter: Payer: Self-pay | Admitting: Plastic Surgery

## 2018-10-28 ENCOUNTER — Other Ambulatory Visit: Payer: Self-pay

## 2018-10-28 ENCOUNTER — Ambulatory Visit (INDEPENDENT_AMBULATORY_CARE_PROVIDER_SITE_OTHER): Payer: Self-pay | Admitting: Plastic Surgery

## 2018-10-28 VITALS — BP 136/70 | HR 73 | Temp 96.4°F | Ht 62.0 in | Wt 114.0 lb

## 2018-10-28 DIAGNOSIS — Z719 Counseling, unspecified: Secondary | ICD-10-CM

## 2018-10-28 NOTE — Progress Notes (Signed)
The patient is a 68 year old female here for evaluation of her face.  She is interested in a facelift but nervous about doing it anytime soon.  We talked about other ways of improving her wrinkling and what she describes as crampiness.  She has had a few laser treatments in the past.  I recommend for her hyperpigmentation the IPL laser.  After that she would do well for the fractional.  We also talked about filler in the midface.  I think she would do extremely well with that. She is a little bit nervous about doing that quite yet

## 2018-11-01 ENCOUNTER — Ambulatory Visit: Payer: Medicare Other | Admitting: Plastic Surgery

## 2018-11-15 ENCOUNTER — Other Ambulatory Visit: Payer: Medicare Other

## 2018-12-13 ENCOUNTER — Other Ambulatory Visit: Payer: Medicare Other | Admitting: Plastic Surgery

## 2019-01-17 ENCOUNTER — Ambulatory Visit (INDEPENDENT_AMBULATORY_CARE_PROVIDER_SITE_OTHER): Payer: Self-pay | Admitting: Plastic Surgery

## 2019-01-17 ENCOUNTER — Encounter: Payer: Self-pay | Admitting: Plastic Surgery

## 2019-01-17 ENCOUNTER — Telehealth: Payer: Self-pay | Admitting: Plastic Surgery

## 2019-01-17 ENCOUNTER — Other Ambulatory Visit: Payer: Self-pay

## 2019-01-17 DIAGNOSIS — Z719 Counseling, unspecified: Secondary | ICD-10-CM

## 2019-01-17 NOTE — Progress Notes (Signed)
Preoperative Dx: hyperpigmentation of face  Postoperative Dx:  same  Procedure: laser to face   Anesthesia: none  Description of Procedure:  Risks and complications were explained to the patient. Consent was confirmed and signed. Time out was called and all information was confirmed to be correct. The area  area was prepped with alcohol and wiped dry. The IPL laser was set at 7.0 J/cm2. The face was lasered. The patient tolerated the procedure well and there were no complications. The patient is to follow up in 4 weeks.

## 2019-01-17 NOTE — Telephone Encounter (Signed)
Pt called in to see if she needed to do any prep for her face prior to her laser appt. Advd her no. She would like to double check with Doroteo Bradford if possible before appt. Best Call back # [phone]

## 2019-01-25 NOTE — Telephone Encounter (Signed)
Call back to pt re: her concerns with post IPL laser treatment (on 01/17/2019) She reports that her face is extremely dry, and she did not notice any difference in her complexion or hyperpigmented areas She denies any peeling/sloughing - but she describes her skin as" dry & when she smiles- her skin appears to have deeper wrinkles- especially around her mouth & eyes" She is asking for advice about what type of moisturizer she should use for hydration She is using a mix of different products-  Obagi- face wash & toner & Daily repair- Elta MD I instructed her to avoid the toner for a few days- because of the drying aspect of it & I will provide her with samples of AM skin therapy, PM skin therapy, Barrier renewal complex & post laser balm Instructions for use provided She does wish to stop by the office on Friday am- to see if Dr. Marla Roe can take a look at her skin for further advice/or other alternatives for future treatments [facility]

## 2019-01-26 ENCOUNTER — Telehealth: Payer: Self-pay | Admitting: Family Medicine

## 2019-01-26 NOTE — Telephone Encounter (Signed)
I would rather wait until the physical. I may want to order additional labs. Also the physical itself can be distracted by the blood work. It all gets confusing.

## 2019-01-26 NOTE — Telephone Encounter (Signed)
I called pt and husband to move their appt because its a CPE and it was scheduled on a Wednesday, pt was fine with it and I moved her and her husband to the following day 03/08/2018, at 9 and 9:30. Pt wanted to know if they could come in just before their appt the same day and get labs done right before visit, I told them I would ask provider and call them back with an answer

## 2019-01-27 NOTE — Telephone Encounter (Signed)
Pt is fine with that.

## 2019-03-03 ENCOUNTER — Other Ambulatory Visit: Payer: Self-pay

## 2019-03-03 ENCOUNTER — Ambulatory Visit: Payer: Medicare PPO | Attending: Internal Medicine

## 2019-03-03 DIAGNOSIS — Z23 Encounter for immunization: Secondary | ICD-10-CM | POA: Insufficient documentation

## 2019-03-03 NOTE — Progress Notes (Signed)
   Covid-19 Vaccination Clinic  Name:  [name]    MRN: [id number] DOB: 1950/09/04  03/03/2019  [name] was observed post Covid-19 immunization for 15 minutes without incidence. She was provided with Vaccine Information Sheet and instruction to access the V-Safe system.   [name] was instructed to call 911 with any severe reactions post vaccine: Marland Kitchen Difficulty breathing  . Swelling of your face and throat  . A fast heartbeat  . A bad rash all over your body  . Dizziness and weakness    Immunizations Administered    Name Date Dose VIS Date Route   Pfizer COVID-19 Vaccine 03/03/2019  4:31 PM 0.3 mL 01/20/2019 Intramuscular   Manufacturer: [place]   Lot: [id number]   [place]: [id number]

## 2019-03-08 ENCOUNTER — Encounter: Payer: Medicare Other | Admitting: Family Medicine

## 2019-03-08 ENCOUNTER — Other Ambulatory Visit: Payer: Self-pay

## 2019-03-09 ENCOUNTER — Encounter: Payer: Self-pay | Admitting: Family Medicine

## 2019-03-09 ENCOUNTER — Ambulatory Visit (INDEPENDENT_AMBULATORY_CARE_PROVIDER_SITE_OTHER): Payer: Medicare PPO | Admitting: Family Medicine

## 2019-03-09 VITALS — BP 100/62 | Temp 97.2°F | Ht 61.0 in | Wt 114.6 lb

## 2019-03-09 DIAGNOSIS — G4700 Insomnia, unspecified: Secondary | ICD-10-CM | POA: Diagnosis not present

## 2019-03-09 DIAGNOSIS — G43C0 Periodic headache syndromes in child or adult, not intractable: Secondary | ICD-10-CM | POA: Diagnosis not present

## 2019-03-09 DIAGNOSIS — Z0000 Encounter for general adult medical examination without abnormal findings: Secondary | ICD-10-CM | POA: Diagnosis not present

## 2019-03-09 DIAGNOSIS — M79641 Pain in right hand: Secondary | ICD-10-CM | POA: Insufficient documentation

## 2019-03-09 DIAGNOSIS — G8929 Other chronic pain: Secondary | ICD-10-CM

## 2019-03-09 DIAGNOSIS — M79644 Pain in right finger(s): Secondary | ICD-10-CM | POA: Insufficient documentation

## 2019-03-09 LAB — COMPREHENSIVE METABOLIC PANEL
ALT: 17 U/L (ref 0–35)
AST: 22 U/L (ref 0–37)
Albumin: 4.2 g/dL (ref 3.5–5.2)
Alkaline Phosphatase: 61 U/L (ref 39–117)
BUN: 17 mg/dL (ref 6–23)
CO2: 31 mEq/L (ref 19–32)
Calcium: 9.9 mg/dL (ref 8.4–10.5)
Chloride: 103 mEq/L (ref 96–112)
Creatinine, Ser: 0.7 mg/dL (ref 0.40–1.20)
GFR: 82.61 mL/min (ref >60.00)
Glucose, Bld: 101 mg/dL — ABNORMAL HIGH (ref 70–99)
Potassium: 4.8 mEq/L (ref 3.5–5.1)
Sodium: 138 mEq/L (ref 135–145)
Total Bilirubin: 0.5 mg/dL (ref 0.2–1.2)
Total Protein: 7.2 g/dL (ref 6.0–8.3)

## 2019-03-09 LAB — CBC
HCT: 39.9 % (ref 36.0–46.0)
Hemoglobin: 13.2 g/dL (ref 12.0–15.0)
MCHC: 33.1 g/dL (ref 30.0–36.0)
MCV: 95.3 fl (ref 78.0–100.0)
Platelets: 273 10*3/uL (ref 150.0–400.0)
RBC: 4.19 Mil/uL (ref 3.87–5.11)
RDW: 12.6 % (ref 11.5–15.5)
WBC: 7.6 10*3/uL (ref 4.0–10.5)

## 2019-03-09 LAB — LIPID PANEL
Cholesterol: 178 mg/dL (ref 0–200)
HDL: 68.1 mg/dL (ref >39.00)
LDL Cholesterol: 90 mg/dL (ref 0–99)
NonHDL: 109.69
Total CHOL/HDL Ratio: 3
Triglycerides: 99 mg/dL (ref 0.0–149.0)
VLDL: 19.8 mg/dL (ref 0.0–40.0)

## 2019-03-09 LAB — URINALYSIS, ROUTINE W REFLEX MICROSCOPIC
Bilirubin Urine: NEGATIVE
Hgb urine dipstick: NEGATIVE
Ketones, ur: NEGATIVE
Leukocytes,Ua: NEGATIVE
Nitrite: NEGATIVE
RBC / HPF: NONE SEEN (ref 0–?)
Specific Gravity, Urine: 1.01 (ref 1.000–1.030)
Total Protein, Urine: NEGATIVE
Urine Glucose: NEGATIVE
Urobilinogen, UA: 0.2 (ref 0.0–1.0)
pH: 7 (ref 5.0–8.0)

## 2019-03-09 MED ORDER — TRAZODONE HCL 50 MG PO TABS: ORAL_TABLET | ORAL | 3 refills | Status: DC

## 2019-03-09 MED ORDER — TRAZODONE HCL 50 MG PO TABS: 25.0000 mg | ORAL_TABLET | Freq: Every evening | ORAL | 3 refills | Status: DC | PRN

## 2019-03-09 NOTE — Progress Notes (Signed)
Established Patient Office Visit  Subjective:  Patient ID: [name], female    DOB: [birth year]  Age: 69 y.o. MRN: [id number]  CC:  Chief Complaint  Patient presents with  . Annual Exam    pt has concerns about not being able to sleep, dizzy spells and hot flashes at night.     HPI ATIYAH Rivera presents for her yearly physical and discuss a few issues she has been having.  Has been dealing with pain at the base of her right thumb.  There is no specific injury.  She has been stressed.  Mom died in 2022/10/05.  She is helping to care for her father.  Ongoing family issues.  Not sleeping particularly well at night.  Has been dealing with dizziness described as a spinning sensation which she goes to lay down at night.  She has been self-medicating with some success by using the Epley maneuvers.  Seeing GYN for ongoing women's health issues.  Past Medical History:  Diagnosis Date  . Anal fissure   . Anemia    during pregnancy  . Anxiety   . Basal cell carcinoma   . DDD (degenerative disc disease)   . Diverticulosis   . Hypokalemia   . LBP (low back pain)   . Melanoma ([place])   . Migraine   . MVP (mitral valve prolapse) 1990   psa  . Rosacea   . Ulcer    as a child    Past Surgical History:  Procedure Laterality Date  . ABDOMINAL HYSTERECTOMY  1985  . BREAST BIOPSY Left    secondary to infection  . I & D EXTREMITY Right 09/09/2012   Procedure: Minor IRRIGATION AND DEBRIDEMENT EXTREMITY paronychia of right thumb;  Surgeon: Wynonia Sours, MD;  Location: [place];  Service: Orthopedics;  Laterality: Right;  . I & D EXTREMITY Left 01/26/2014   Procedure: MINOR IINCISION AND DRAINAGE paronychia left index finger;  Surgeon: Daryll Brod, MD;  Location: [place];  Service: Orthopedics;  Laterality: Left;  left index  . OOPHORECTOMY Left 1994  . REPAIR KNEE LIGAMENT Left 1989  . TONSILLECTOMY  1955  . TUBAL LIGATION    . VEIN SURGERY      Family  History  Problem Relation Age of Onset  . Heart disease Mother   . Colon polyps Mother   . Heart disease Father   . Colitis Father   . Colon polyps Father   . Esophageal cancer Maternal Aunt   . Colon cancer Neg Hx     Social History   Socioeconomic History  . Marital status: Married    Spouse name: Not on file  . Number of children: 2  . Years of education: Not on file  . Highest education level: Not on file  Occupational History  . Occupation: retired Conservation officer, historic buildings  Tobacco Use  . Smoking status: Never Smoker  . Smokeless tobacco: Never Used  Substance and Sexual Activity  . Alcohol use: Yes    Alcohol/week: 1.0 standard drinks    Types: 1 Standard drinks or equivalent per week    Comment: social wine  . Drug use: No  . Sexual activity: Yes  Other Topics Concern  . Not on file  Social History Narrative  . Not on file   Social Determinants of Health   Financial Resource Strain:   . Difficulty of Paying Living Expenses: Not on file  Food Insecurity:   . Worried About  Running Out of Food in the Last Year: Not on file  . Ran Out of Food in the Last Year: Not on file  Transportation Needs:   . Lack of Transportation (Medical): Not on file  . Lack of Transportation (Non-Medical): Not on file  Physical Activity:   . Days of Exercise per Week: Not on file  . Minutes of Exercise per Session: Not on file  Stress:   . Feeling of Stress : Not on file  Social Connections:   . Frequency of Communication with Friends and Family: Not on file  . Frequency of Social Gatherings with Friends and Family: Not on file  . Attends Religious Services: Not on file  . Active Member of Clubs or Organizations: Not on file  . Attends Archivist Meetings: Not on file  . Marital Status: Not on file  Intimate Partner Violence:   . Fear of Current or Ex-Partner: Not on file  . Emotionally Abused: Not on file  . Physically Abused: Not on file  . Sexually Abused: Not on file     Outpatient Medications Prior to Visit  Medication Sig Dispense Refill  . Ascorbic Acid (VITAMIN C) 100 MG tablet Take 100 mg by mouth daily.      Marland Kitchen aspirin 81 MG tablet Take 81 mg by mouth. ON OCCASION    . atenolol (TENORMIN) 25 MG tablet One half tablet when necessary for palpitations 30 tablet 4  . BIOTIN PO Take by mouth.    . calcium-vitamin D 250-100 MG-UNIT per tablet Take 1 tablet by mouth daily.      Marland Kitchen doxycycline (VIBRA-TABS) 100 MG tablet TAKE ONE TABLET DAILY AS DIRECTED    . estradiol (ESTRACE) 0.5 MG tablet Take 0.5 mg by mouth daily.    . hydrocortisone (ANUSOL-HC) 2.5 % rectal cream Apply rectally at bedtime, x 3 weeks 30 g 2  . omeprazole (PRILOSEC) 20 MG capsule Take 1 capsule (20 mg total) by mouth daily. AS NEEDED 100 capsule 4  . vitamin E 100 UNIT capsule Take 100 Units by mouth daily.      Marland Kitchen zolmitriptan (ZOMIG ZMT) 2.5 MG disintegrating tablet Take 1 tablet (2.5 mg total) by mouth as needed for migraine. 10 tablet 4  . lidocaine-prilocaine (EMLA) cream Apply 1 application topically as needed. (Patient not taking: Reported on 03/09/2019) 30 g 0  . NON FORMULARY Hydro eye     No facility-administered medications prior to visit.    Allergies  Allergen Reactions  . Codeine   . Compazine   . Gabapentin   . Prednisone     REACTION: makes heart beat hard \\T \ fast  . Prochlorperazine Edisylate   . Sulfa Antibiotics   . Sulfonamide Derivatives     ROS Review of Systems  Constitutional: Negative.   HENT: Negative.   Eyes: Negative for photophobia and visual disturbance.  Respiratory: Negative.   Cardiovascular: Negative.   Gastrointestinal: Negative.   Endocrine: Negative for polyphagia and polyuria.  Genitourinary: Negative for difficulty urinating and frequency.  Musculoskeletal: Positive for arthralgias.  Skin: Negative for pallor and rash.  Allergic/Immunologic: Negative for immunocompromised state.  Neurological: Positive for headaches.   Hematological: Does not bruise/bleed easily.  Psychiatric/Behavioral: Positive for sleep disturbance.   Depression screen [facility] 2/9 03/09/2019 03/09/2019 02/23/2018  Decreased Interest 0 0 1  Down, Depressed, Hopeless 0 0 1  PHQ - 2 Score 0 0 2  Altered sleeping 3 - 2  Tired, decreased energy 0 - 2  Change  in appetite 0 - 0  Feeling bad or failure about yourself  1 - 2  Trouble concentrating 1 - 2  Moving slowly or fidgety/restless 1 - 0  Suicidal thoughts 0 - 0  PHQ-9 Score 6 - 10       Depression screen [facility] 2/9 03/09/2019 02/23/2018 02/18/2016  Decreased Interest 0 1 0  Down, Depressed, Hopeless 0 1 0  PHQ - 2 Score 0 2 0  Altered sleeping - 2 -  Tired, decreased energy - 2 -  Change in appetite - 0 -  Feeling bad or failure about yourself  - 2 -  Trouble concentrating - 2 -  Moving slowly or fidgety/restless - 0 -  Suicidal thoughts - 0 -  PHQ-9 Score - 10 -    Objective:    Physical Exam  Constitutional: She is oriented to person, place, and time. She appears well-developed and well-nourished. No distress.  HENT:  Head: Normocephalic and atraumatic.  Right Ear: External ear normal.  Left Ear: External ear normal.  Eyes: Conjunctivae are normal. Right eye exhibits no discharge. Left eye exhibits no discharge. No scleral icterus.  Neck: No JVD present. No tracheal deviation present. No thyromegaly present.  Cardiovascular: Normal rate, regular rhythm and normal heart sounds.  Pulmonary/Chest: Effort normal and breath sounds normal. No stridor.  Abdominal: Bowel sounds are normal.  Musculoskeletal:        General: No edema.  Lymphadenopathy:    She has no cervical adenopathy.  Neurological: She is alert and oriented to person, place, and time.  Skin: Skin is warm and dry. She is not diaphoretic.  Psychiatric: She has a normal mood and affect. Her behavior is normal.    BP 100/62   Temp (!) 97.2 F (36.2 C) (Temporal)   Ht 5\' 1"  (1.549 m)   Wt 116 lb 9.6 oz (52.9  kg)   BMI 22.03 kg/m  Wt Readings from Last 3 Encounters:  03/09/19 116 lb 9.6 oz (52.9 kg)  10/28/18 114 lb (51.7 kg)  06/07/18 110 lb (49.9 kg)     Health Maintenance Due  Topic Date Due  . COLONOSCOPY  05/31/2017    There are no preventive care reminders to display for this patient.  Lab Results  Component Value Date   TSH 1.50 02/24/2017   Lab Results  Component Value Date   WBC 7.8 03/07/2018   HGB 13.2 03/07/2018   HCT 39.3 03/07/2018   MCV 94.9 03/07/2018   PLT 263.0 03/07/2018   Lab Results  Component Value Date   NA 139 03/07/2018   K 3.6 03/07/2018   CO2 31 03/07/2018   GLUCOSE 105 (H) 03/07/2018   BUN 15 03/07/2018   CREATININE 0.72 03/07/2018   BILITOT 0.6 03/07/2018   ALKPHOS 59 03/07/2018   AST 15 03/07/2018   ALT 11 03/07/2018   PROT 7.1 03/07/2018   ALBUMIN 4.3 03/07/2018   CALCIUM 10.0 03/07/2018   ANIONGAP 8 02/22/2018   GFR 80.20 03/07/2018   Lab Results  Component Value Date   CHOL 177 03/07/2018   Lab Results  Component Value Date   HDL 70.50 03/07/2018   Lab Results  Component Value Date   LDLCALC 76 03/07/2018   Lab Results  Component Value Date   TRIG 156.0 (H) 03/07/2018   Lab Results  Component Value Date   CHOLHDL 3 03/07/2018   No results found for: HGBA1C    Assessment & Plan:   Problem List Items Addressed This  Visit      Cardiovascular and Mediastinum   Migraine   Relevant Medications   traZODone (DESYREL) 50 MG tablet     Other   Insomnia - Primary   Relevant Medications   traZODone (DESYREL) 50 MG tablet   Healthcare maintenance   Relevant Orders   CBC   Comprehensive metabolic panel   Lipid panel   Urinalysis, Routine w reflex microscopic   Ambulatory referral to Gastroenterology   Chronic pain of right thumb   Relevant Medications   traZODone (DESYREL) 50 MG tablet   Other Relevant Orders   Ambulatory referral to Sports Medicine      Meds ordered this encounter  Medications  .  DISCONTD: traZODone (DESYREL) 50 MG tablet    Sig: Take 0.5-1 tablets (25-50 mg total) by mouth at bedtime as needed for sleep.    Dispense:  30 tablet    Refill:  3  . traZODone (DESYREL) 50 MG tablet    Sig: Take 1/4 to 1/2 tab at night as needed for sleep.    Dispense:  30 tablet    Refill:  3    Follow-up: Return in about 1 year (around 03/08/2020), or if symptoms worsen or fail to improve.   Patient was given information on insomnia and how to perform Epley maneuvers.  She was also given information on health maintenance and preventative care.  We discussed using Paxil patient would like to hold off for now. Libby Maw, MD

## 2019-03-09 NOTE — Patient Instructions (Signed)
How to Perform the Epley Maneuver The Epley maneuver is an exercise that relieves symptoms of vertigo. Vertigo is the feeling that you or your surroundings are moving when they are not. When you feel vertigo, you may feel like the room is spinning and have trouble walking. Dizziness is a little different than vertigo. When you are dizzy, you may feel unsteady or light-headed. You can do this maneuver at home whenever you have symptoms of vertigo. You can do it up to 3 times a day until your symptoms go away. Even though the Epley maneuver may relieve your vertigo for a few weeks, it is possible that your symptoms will return. This maneuver relieves vertigo, but it does not relieve dizziness. What are the risks? If it is done correctly, the Epley maneuver is considered safe. Sometimes it can lead to dizziness or nausea that goes away after a short time. If you develop other symptoms, such as changes in vision, weakness, or numbness, stop doing the maneuver and call your health care provider. How to perform the Epley maneuver 1. Sit on the edge of a bed or table with your back straight and your legs extended or hanging over the edge of the bed or table. 2. Turn your head halfway toward the affected ear or side. 3. Lie backward quickly with your head turned until you are lying flat on your back. You may want to position a pillow under your shoulders. 4. Hold this position for 30 seconds. You may experience an attack of vertigo. This is normal. 5. Turn your head to the opposite direction until your unaffected ear is facing the floor. 6. Hold this position for 30 seconds. You may experience an attack of vertigo. This is normal. Hold this position until the vertigo stops. 7. Turn your whole body to the same side as your head. Hold for another 30 seconds. 8. Sit back up. You can repeat this exercise up to 3 times a day. Follow these instructions at home:  After doing the Epley maneuver, you can return to  your normal activities.  Ask your health care provider if there is anything you should do at home to prevent vertigo. He or she may recommend that you: ? Keep your head raised (elevated) with two or more pillows while you sleep. ? Do not sleep on the side of your affected ear. ? Get up slowly from bed. ? Avoid sudden movements during the day. ? Avoid extreme head movement, like looking up or bending over. Contact a health care provider if:  Your vertigo gets worse.  You have other symptoms, including: ? Nausea. ? Vomiting. ? Headache. Get help right away if:  You have vision changes.  You have a severe or worsening headache or neck pain.  You cannot stop vomiting.  You have new numbness or weakness in any part of your body. Summary  Vertigo is the feeling that you or your surroundings are moving when they are not.  The Epley maneuver is an exercise that relieves symptoms of vertigo.  If the Epley maneuver is done correctly, it is considered safe. You can do it up to 3 times a day. This information is not intended to replace advice given to you by your health care provider. Make sure you discuss any questions you have with your health care provider. Document Revised: 01/08/2017 Document Reviewed: 12/17/2015 Elsevier Patient Education  [place].  Insomnia Insomnia is a sleep disorder that makes it difficult to fall asleep or  stay asleep. Insomnia can cause fatigue, low energy, difficulty concentrating, mood swings, and poor performance at work or school. There are three different ways to classify insomnia:  Difficulty falling asleep.  Difficulty staying asleep.  Waking up too early in the morning. Any type of insomnia can be long-term (chronic) or short-term (acute). Both are common. Short-term insomnia usually lasts for three months or less. Chronic insomnia occurs at least three times a week for longer than three months. What are the causes? Insomnia may be  caused by another condition, situation, or substance, such as:  Anxiety.  Certain medicines.  Gastroesophageal reflux disease (GERD) or other gastrointestinal conditions.  Asthma or other breathing conditions.  Restless legs syndrome, sleep apnea, or other sleep disorders.  Chronic pain.  Menopause.  Stroke.  Abuse of alcohol, tobacco, or illegal drugs.  Mental health conditions, such as depression.  Caffeine.  Neurological disorders, such as Alzheimer's disease.  An overactive thyroid (hyperthyroidism). Sometimes, the cause of insomnia may not be known. What increases the risk? Risk factors for insomnia include:  Gender. Women are affected more often than men.  Age. Insomnia is more common as you get older.  Stress.  Lack of exercise.  Irregular work schedule or working night shifts.  Traveling between different time zones.  Certain medical and mental health conditions. What are the signs or symptoms? If you have insomnia, the main symptom is having trouble falling asleep or having trouble staying asleep. This may lead to other symptoms, such as:  Feeling fatigued or having low energy.  Feeling nervous about going to sleep.  Not feeling rested in the morning.  Having trouble concentrating.  Feeling irritable, anxious, or depressed. How is this diagnosed? This condition may be diagnosed based on:  Your symptoms and medical history. Your health care provider may ask about: ? Your sleep habits. ? Any medical conditions you have. ? Your mental health.  A physical exam. How is this treated? Treatment for insomnia depends on the cause. Treatment may focus on treating an underlying condition that is causing insomnia. Treatment may also include:  Medicines to help you sleep.  Counseling or therapy.  Lifestyle adjustments to help you sleep better. Follow these instructions at home: Eating and drinking   Limit or avoid alcohol, caffeinated  beverages, and cigarettes, especially close to bedtime. These can disrupt your sleep.  Do not eat a large meal or eat spicy foods right before bedtime. This can lead to digestive discomfort that can make it hard for you to sleep. Sleep habits   Keep a sleep diary to help you and your health care provider figure out what could be causing your insomnia. Write down: ? When you sleep. ? When you wake up during the night. ? How well you sleep. ? How rested you feel the next day. ? Any side effects of medicines you are taking. ? What you eat and drink.  Make your bedroom a dark, comfortable place where it is easy to fall asleep. ? Put up shades or blackout curtains to block light from outside. ? Use a white noise machine to block noise. ? Keep the temperature cool.  Limit screen use before bedtime. This includes: ? Watching TV. ? Using your smartphone, tablet, or computer.  Stick to a routine that includes going to bed and waking up at the same times every day and night. This can help you fall asleep faster. Consider making a quiet activity, such as reading, part of your nighttime routine.  Try to avoid taking naps during the day so that you sleep better at night.  Get out of bed if you are still awake after 15 minutes of trying to sleep. Keep the lights down, but try reading or doing a quiet activity. When you feel sleepy, go back to bed. General instructions  Take over-the-counter and prescription medicines only as told by your health care provider.  Exercise regularly, as told by your health care provider. Avoid exercise starting several hours before bedtime.  Use relaxation techniques to manage stress. Ask your health care provider to suggest some techniques that may work well for you. These may include: ? Breathing exercises. ? Routines to release muscle tension. ? Visualizing peaceful scenes.  Make sure that you drive carefully. Avoid driving if you feel very sleepy.  Keep  all follow-up visits as told by your health care provider. This is important. Contact a health care provider if:  You are tired throughout the day.  You have trouble in your daily routine due to sleepiness.  You continue to have sleep problems, or your sleep problems get worse. Get help right away if:  You have serious thoughts about hurting yourself or someone else. If you ever feel like you may hurt yourself or others, or have thoughts about taking your own life, get help right away. You can go to your nearest emergency department or call:  Your local emergency services (911 in the [place]  A suicide crisis helpline, such as the [facility] at 812-496-5528. This is open 24 hours a day. Summary  Insomnia is a sleep disorder that makes it difficult to fall asleep or stay asleep.  Insomnia can be long-term (chronic) or short-term (acute).  Treatment for insomnia depends on the cause. Treatment may focus on treating an underlying condition that is causing insomnia.  Keep a sleep diary to help you and your health care provider figure out what could be causing your insomnia. This information is not intended to replace advice given to you by your health care provider. Make sure you discuss any questions you have with your health care provider. Document Revised: 01/08/2017 Document Reviewed: 11/05/2016 Elsevier Patient Education  2020 [place] 65 Years and Older, Female Preventive care refers to lifestyle choices and visits with your health care provider that can promote health and wellness. This includes:  A yearly physical exam. This is also called an annual well check.  Regular dental and eye exams.  Immunizations.  Screening for certain conditions.  Healthy lifestyle choices, such as diet and exercise. What can I expect for my preventive care visit? Physical exam Your health care provider will check:  Height and weight. These  may be used to calculate body mass index (BMI), which is a measurement that tells if you are at a healthy weight.  Heart rate and blood pressure.  Your skin for abnormal spots. Counseling Your health care provider may ask you questions about:  Alcohol, tobacco, and drug use.  Emotional well-being.  Home and relationship well-being.  Sexual activity.  Eating habits.  History of falls.  Memory and ability to understand (cognition).  Work and work Statistician.  Pregnancy and menstrual history. What immunizations do I need?  Influenza (flu) vaccine  This is recommended every year. Tetanus, diphtheria, and pertussis (Tdap) vaccine  You may need a Td booster every 10 years. Varicella (chickenpox) vaccine  You may need this vaccine if you have not already been vaccinated. Zoster (shingles) vaccine  You may need this after age 75. Pneumococcal conjugate (PCV13) vaccine  One dose is recommended after age 40. Pneumococcal polysaccharide (PPSV23) vaccine  One dose is recommended after age 8. Measles, mumps, and rubella (MMR) vaccine  You may need at least one dose of MMR if you were born in 1957 or later. You may also need a second dose. Meningococcal conjugate (MenACWY) vaccine  You may need this if you have certain conditions. Hepatitis A vaccine  You may need this if you have certain conditions or if you travel or work in places where you may be exposed to hepatitis A. Hepatitis B vaccine  You may need this if you have certain conditions or if you travel or work in places where you may be exposed to hepatitis B. Haemophilus influenzae type b (Hib) vaccine  You may need this if you have certain conditions. You may receive vaccines as individual doses or as more than one vaccine together in one shot (combination vaccines). Talk with your health care provider about the risks and benefits of combination vaccines. What tests do I need? Blood tests  Lipid and  cholesterol levels. These may be checked every 5 years, or more frequently depending on your overall health.  Hepatitis C test.  Hepatitis B test. Screening  Lung cancer screening. You may have this screening every year starting at age 46 if you have a 30-pack-year history of smoking and currently smoke or have quit within the past 15 years.  Colorectal cancer screening. All adults should have this screening starting at age 3 and continuing until age 23. Your health care provider may recommend screening at age 80 if you are at increased risk. You will have tests every 1-10 years, depending on your results and the type of screening test.  Diabetes screening. This is done by checking your blood sugar (glucose) after you have not eaten for a while (fasting). You may have this done every 1-3 years.  Mammogram. This may be done every 1-2 years. Talk with your health care provider about how often you should have regular mammograms.  BRCA-related cancer screening. This may be done if you have a family history of breast, ovarian, tubal, or peritoneal cancers. Other tests  Sexually transmitted disease (STD) testing.  Bone density scan. This is done to screen for osteoporosis. You may have this done starting at age 85. Follow these instructions at home: Eating and drinking  Eat a diet that includes fresh fruits and vegetables, whole grains, lean protein, and low-fat dairy products. Limit your intake of foods with high amounts of sugar, saturated fats, and salt.  Take vitamin and mineral supplements as recommended by your health care provider.  Do not drink alcohol if your health care provider tells you not to drink.  If you drink alcohol: ? Limit how much you have to 0-1 drink a day. ? Be aware of how much alcohol is in your drink. In the [place] one drink equals one 12 oz bottle of beer (355 mL), one 5 oz glass of wine (148 mL), or one 1 oz glass of hard liquor (44 mL). Lifestyle  Take  daily care of your teeth and gums.  Stay active. Exercise for at least 30 minutes on 5 or more days each week.  Do not use any products that contain nicotine or tobacco, such as cigarettes, e-cigarettes, and chewing tobacco. If you need help quitting, ask your health care provider.  If you are sexually active, practice safe sex. Use a  condom or other form of protection in order to prevent STIs (sexually transmitted infections).  Talk with your health care provider about taking a low-dose aspirin or statin. What's next?  Go to your health care provider once a year for a well check visit.  Ask your health care provider how often you should have your eyes and teeth checked.  Stay up to date on all vaccines. This information is not intended to replace advice given to you by your health care provider. Make sure you discuss any questions you have with your health care provider. Document Revised: 01/20/2018 Document Reviewed: 01/20/2018 Elsevier Patient Education  [place] After Age 55 After age 54, you are at a higher risk for certain long-term diseases and infections as well as injuries from falls. Falls are a major cause of broken bones and head injuries in people who are older than age 7. Getting regular preventive care can help to keep you healthy and well. Preventive care includes getting regular testing and making lifestyle changes as recommended by your health care provider. Talk with your health care provider about:  Which screenings and tests you should have. A screening is a test that checks for a disease when you have no symptoms.  A diet and exercise plan that is right for you. What should I know about screenings and tests to prevent falls? Screening and testing are the best ways to find a health problem early. Early diagnosis and treatment give you the best chance of managing medical conditions that are common after age 35. Certain conditions and  lifestyle choices may make you more likely to have a fall. Your health care provider may recommend:  Regular vision checks. Poor vision and conditions such as cataracts can make you more likely to have a fall. If you wear glasses, make sure to get your prescription updated if your vision changes.  Medicine review. Work with your health care provider to regularly review all of the medicines you are taking, including over-the-counter medicines. Ask your health care provider about any side effects that may make you more likely to have a fall. Tell your health care provider if any medicines that you take make you feel dizzy or sleepy.  Osteoporosis screening. Osteoporosis is a condition that causes the bones to get weaker. This can make the bones weak and cause them to break more easily.  Blood pressure screening. Blood pressure changes and medicines to control blood pressure can make you feel dizzy.  Strength and balance checks. Your health care provider may recommend certain tests to check your strength and balance while standing, walking, or changing positions.  Foot health exam. Foot pain and numbness, as well as not wearing proper footwear, can make you more likely to have a fall.  Depression screening. You may be more likely to have a fall if you have a fear of falling, feel emotionally low, or feel unable to do activities that you used to do.  Alcohol use screening. Using too much alcohol can affect your balance and may make you more likely to have a fall. What actions can I take to lower my risk of falls? General instructions  Talk with your health care provider about your risks for falling. Tell your health care provider if: ? You fall. Be sure to tell your health care provider about all falls, even ones that seem minor. ? You feel dizzy, sleepy, or off-balance.  Take over-the-counter and prescription medicines only as told by  your health care provider. These include any  supplements.  Eat a healthy diet and maintain a healthy weight. A healthy diet includes low-fat dairy products, low-fat (lean) meats, and fiber from whole grains, beans, and lots of fruits and vegetables. Home safety  Remove any tripping hazards, such as rugs, cords, and clutter.  Install safety equipment such as grab bars in bathrooms and safety rails on stairs.  Keep rooms and walkways well-lit. Activity   Follow a regular exercise program to stay fit. This will help you maintain your balance. Ask your health care provider what types of exercise are appropriate for you.  If you need a cane or walker, use it as recommended by your health care provider.  Wear supportive shoes that have nonskid soles. Lifestyle  Do not drink alcohol if your health care provider tells you not to drink.  If you drink alcohol, limit how much you have: ? 0-1 drink a day for women. ? 0-2 drinks a day for men.  Be aware of how much alcohol is in your drink. In the [place] one drink equals one typical bottle of beer (12 oz), one-half glass of wine (5 oz), or one shot of hard liquor (1 oz).  Do not use any products that contain nicotine or tobacco, such as cigarettes and e-cigarettes. If you need help quitting, ask your health care provider. Summary  Having a healthy lifestyle and getting preventive care can help to protect your health and wellness after age 54.  Screening and testing are the best way to find a health problem early and help you avoid having a fall. Early diagnosis and treatment give you the best chance for managing medical conditions that are more common for people who are older than age 63.  Falls are a major cause of broken bones and head injuries in people who are older than age 62. Take precautions to prevent a fall at home.  Work with your health care provider to learn what changes you can make to improve your health and wellness and to prevent falls. This information is not intended  to replace advice given to you by your health care provider. Make sure you discuss any questions you have with your health care provider. Document Revised: 05/19/2018 Document Reviewed: 12/09/2016 Elsevier Patient Education  2020 [employer].

## 2019-03-13 ENCOUNTER — Telehealth: Payer: Self-pay | Admitting: Family Medicine

## 2019-03-13 DIAGNOSIS — G43C0 Periodic headache syndromes in child or adult, not intractable: Secondary | ICD-10-CM

## 2019-03-13 MED ORDER — ZOLMITRIPTAN 2.5 MG PO TBDP: 2.5000 mg | ORAL_TABLET | ORAL | 4 refills | Status: DC | PRN

## 2019-03-13 NOTE — Telephone Encounter (Signed)
Patient is calling and requesting a refill for Zolmitripton quantity of 10 pills sent to [facility] in [place]. CB is [phone]

## 2019-03-13 NOTE — Telephone Encounter (Signed)
Dr. Ethelene Hal please advise on refill for Zolmitripton

## 2019-03-13 NOTE — Telephone Encounter (Signed)
Patient is calling about the status of her refill on medication. Please call patient with status.

## 2019-03-13 NOTE — Telephone Encounter (Signed)
Patient is calling back regarding status for refill. CB is [phone]

## 2019-03-13 NOTE — Telephone Encounter (Signed)
Pt aware that Rx sent in.

## 2019-03-22 ENCOUNTER — Ambulatory Visit: Payer: Medicare PPO | Admitting: Sports Medicine

## 2019-03-24 ENCOUNTER — Ambulatory Visit: Payer: Medicare PPO | Attending: Internal Medicine

## 2019-03-24 DIAGNOSIS — Z23 Encounter for immunization: Secondary | ICD-10-CM

## 2019-03-24 NOTE — Progress Notes (Signed)
   Covid-19 Vaccination Clinic  Name:  [name]    MRN: [id number] DOB: 11-03-1950  03/24/2019  [name] was observed post Covid-19 immunization for 15 minutes without incidence. She was provided with Vaccine Information Sheet and instruction to access the V-Safe system.   [name] was instructed to call 911 with any severe reactions post vaccine: Marland Kitchen Difficulty breathing  . Swelling of your face and throat  . A fast heartbeat  . A bad rash all over your body  . Dizziness and weakness    Immunizations Administered    Name Date Dose VIS Date Route   Pfizer COVID-19 Vaccine 03/24/2019  2:28 PM 0.3 mL 01/20/2019 Intramuscular   Manufacturer: Delta   Lot: [id number]   [place]: [id number]

## 2019-03-29 ENCOUNTER — Ambulatory Visit: Payer: Medicare PPO | Admitting: Sports Medicine

## 2019-04-14 ENCOUNTER — Other Ambulatory Visit: Payer: Self-pay | Admitting: Surgery

## 2019-04-14 DIAGNOSIS — K429 Umbilical hernia without obstruction or gangrene: Secondary | ICD-10-CM | POA: Diagnosis not present

## 2019-04-18 ENCOUNTER — Telehealth: Payer: Self-pay | Admitting: Internal Medicine

## 2019-04-18 NOTE — Telephone Encounter (Signed)
Absolutely. Thanks 

## 2019-04-18 NOTE — Telephone Encounter (Signed)
Dr. Henrene Pastor, this pt is a former pt of Dr. Olevia Perches.  She has also been seen at [facility] in 2020.  Pt is requested you to perform her recall colonoscopy because her husband is your patient.  Will you accept this pt?

## 2019-04-19 NOTE — Telephone Encounter (Signed)
A message was left on voicemail for pt to call back to schedule.  

## 2019-05-02 ENCOUNTER — Encounter: Payer: Self-pay | Admitting: Family Medicine

## 2019-05-02 DIAGNOSIS — Z1231 Encounter for screening mammogram for malignant neoplasm of breast: Secondary | ICD-10-CM | POA: Diagnosis not present

## 2019-05-03 DIAGNOSIS — I8312 Varicose veins of left lower extremity with inflammation: Secondary | ICD-10-CM | POA: Diagnosis not present

## 2019-05-08 DIAGNOSIS — Z1211 Encounter for screening for malignant neoplasm of colon: Secondary | ICD-10-CM | POA: Diagnosis not present

## 2019-05-10 LAB — COLOGUARD: COLOGUARD: NEGATIVE

## 2019-05-21 ENCOUNTER — Encounter: Payer: Self-pay | Admitting: Family Medicine

## 2019-05-22 ENCOUNTER — Telehealth: Payer: Self-pay

## 2019-05-22 DIAGNOSIS — K644 Residual hemorrhoidal skin tags: Secondary | ICD-10-CM

## 2019-05-22 NOTE — Telephone Encounter (Signed)
Patient calling for refill on Hydrocortisone rectal cream. I tried refilling but was not able to message states that new Rx need to be sent in. Please advise. Last OV January 2021

## 2019-05-23 MED ORDER — HYDROCORTISONE (PERIANAL) 2.5 % EX CREA: 1.0000 "application " | TOPICAL_CREAM | Freq: Two times a day (BID) | CUTANEOUS | 0 refills | Status: DC

## 2019-05-23 NOTE — Telephone Encounter (Signed)
Done

## 2019-06-01 DIAGNOSIS — M180 Bilateral primary osteoarthritis of first carpometacarpal joints: Secondary | ICD-10-CM | POA: Diagnosis not present

## 2019-06-01 DIAGNOSIS — M1811 Unilateral primary osteoarthritis of first carpometacarpal joint, right hand: Secondary | ICD-10-CM | POA: Diagnosis not present

## 2019-06-16 ENCOUNTER — Other Ambulatory Visit: Payer: Self-pay | Admitting: Surgery

## 2019-06-16 DIAGNOSIS — K429 Umbilical hernia without obstruction or gangrene: Secondary | ICD-10-CM | POA: Diagnosis not present

## 2019-06-27 ENCOUNTER — Encounter: Payer: Self-pay | Admitting: Nurse Practitioner

## 2019-07-05 DIAGNOSIS — H0100A Unspecified blepharitis right eye, upper and lower eyelids: Secondary | ICD-10-CM | POA: Diagnosis not present

## 2019-07-05 DIAGNOSIS — L719 Rosacea, unspecified: Secondary | ICD-10-CM | POA: Diagnosis not present

## 2019-07-12 DIAGNOSIS — L821 Other seborrheic keratosis: Secondary | ICD-10-CM | POA: Diagnosis not present

## 2019-07-12 DIAGNOSIS — L738 Other specified follicular disorders: Secondary | ICD-10-CM | POA: Diagnosis not present

## 2019-07-12 DIAGNOSIS — D2262 Melanocytic nevi of left upper limb, including shoulder: Secondary | ICD-10-CM | POA: Diagnosis not present

## 2019-07-12 DIAGNOSIS — Z85828 Personal history of other malignant neoplasm of skin: Secondary | ICD-10-CM | POA: Diagnosis not present

## 2019-07-12 DIAGNOSIS — L910 Hypertrophic scar: Secondary | ICD-10-CM | POA: Diagnosis not present

## 2019-07-12 DIAGNOSIS — D225 Melanocytic nevi of trunk: Secondary | ICD-10-CM | POA: Diagnosis not present

## 2019-07-12 DIAGNOSIS — Z85820 Personal history of malignant melanoma of skin: Secondary | ICD-10-CM | POA: Diagnosis not present

## 2019-07-12 DIAGNOSIS — D2271 Melanocytic nevi of right lower limb, including hip: Secondary | ICD-10-CM | POA: Diagnosis not present

## 2019-07-20 ENCOUNTER — Encounter: Payer: Self-pay | Admitting: Nurse Practitioner

## 2019-07-20 ENCOUNTER — Other Ambulatory Visit (INDEPENDENT_AMBULATORY_CARE_PROVIDER_SITE_OTHER): Payer: Medicare PPO

## 2019-07-20 ENCOUNTER — Ambulatory Visit: Payer: Medicare PPO | Admitting: Nurse Practitioner

## 2019-07-20 VITALS — BP 128/74 | HR 81 | Ht 62.0 in | Wt 115.0 lb

## 2019-07-20 DIAGNOSIS — R1013 Epigastric pain: Secondary | ICD-10-CM

## 2019-07-20 DIAGNOSIS — Z86010 Personal history of colonic polyps: Secondary | ICD-10-CM

## 2019-07-20 DIAGNOSIS — K429 Umbilical hernia without obstruction or gangrene: Secondary | ICD-10-CM | POA: Diagnosis not present

## 2019-07-20 DIAGNOSIS — R140 Abdominal distension (gaseous): Secondary | ICD-10-CM

## 2019-07-20 LAB — COMPREHENSIVE METABOLIC PANEL
ALT: 16 U/L (ref 0–35)
AST: 19 U/L (ref 0–37)
Albumin: 4.7 g/dL (ref 3.5–5.2)
Alkaline Phosphatase: 65 U/L (ref 39–117)
BUN: 16 mg/dL (ref 6–23)
CO2: 34 mEq/L — ABNORMAL HIGH (ref 19–32)
Calcium: 10.7 mg/dL — ABNORMAL HIGH (ref 8.4–10.5)
Chloride: 101 mEq/L (ref 96–112)
Creatinine, Ser: 0.68 mg/dL (ref 0.40–1.20)
GFR: 85.33 mL/min (ref >60.00)
Glucose, Bld: 104 mg/dL — ABNORMAL HIGH (ref 70–99)
Potassium: 4.2 mEq/L (ref 3.5–5.1)
Sodium: 138 mEq/L (ref 135–145)
Total Bilirubin: 0.4 mg/dL (ref 0.2–1.2)
Total Protein: 7.9 g/dL (ref 6.0–8.3)

## 2019-07-20 LAB — CBC
HCT: 40 % (ref 36.0–46.0)
Hemoglobin: 13.3 g/dL (ref 12.0–15.0)
MCHC: 33.2 g/dL (ref 30.0–36.0)
MCV: 95.9 fl (ref 78.0–100.0)
Platelets: 282 10*3/uL (ref 150.0–400.0)
RBC: 4.17 Mil/uL (ref 3.87–5.11)
RDW: 12.7 % (ref 11.5–15.5)
WBC: 7.7 10*3/uL (ref 4.0–10.5)

## 2019-07-20 MED ORDER — SUTAB 1479-225-188 MG PO TABS
1.0000 | ORAL_TABLET | Freq: Once | ORAL | 0 refills | Status: AC
Start: 2019-07-20 — End: 2019-07-20

## 2019-07-20 NOTE — Progress Notes (Signed)
07/20/2019 [name] [id number] 1950-03-10   CHIEF COMPLAINT: Abdominal swelling   HISTORY OF PRESENT ILLNESS: [name] is a 69 year old female with a past medical history of anxiety, DDD, malignant melanoma, migraine headaches. She presents today with complaints of having a change to her abdomen, swelling to her abdomen. She stated she always has a flat abdomen and over the past 6 months she has noticed swelling or fat tissue to her abdomen which is concerning to her. She denies having any abdominal pain. She does not like the appearance of her abdomen. She has an umbilical hernia as assessed recently by Dr. Ninfa Linden, she does not wish to pursue surgical intervention at this time. She is passing a normal formed brown BM daily. She is taking Colace daily. If she passes a large stool she sometimes sees a small amount of bright red blood on the toilet tissue.  She was previously followed by Dr. Olevia Perches. She underwent a colonoscopy 05/31/2012 which identified a 15 mm tubulovillous adenomatous polyp to the rectum which was removed. She was advised by Dr. Olevia Perches to repeat a colonoscopy in 5 years. She elected to continue her GI care with Dr. Scarlette Shorts 04/2019. She intended to schedule a colonoscopy but deferred due to the covid pandemic. She then underwent a Cologuard Test approximately 8 weeks ago which was normal. I discussed with the patient a conventional colonoscopy is recommended as she had a tubulovillous adenomatous rectal polyp removed in 2014 which carries a high risk of developing colorectal cancer. I recommended a colonoscopy at this time. Mother and father with history of colon polyps. No family history of colon cancer. She had brief rectal pain prior to having a recent recta exam done by her PCP. No further rectal pain since that time. She infrequently takes Meloxicam for thumb arthritis. She takes ASA 81mg  2 to 3 times weekly. Rarely needs to take Omeprazole for heartburn. No other  complaints today.   Past Medical History:  Diagnosis Date  . Anal fissure   . Anemia    during pregnancy  . Anxiety   . Basal cell carcinoma   . DDD (degenerative disc disease)   . Diverticulosis   . Hypokalemia   . LBP (low back pain)   . Melanoma ([place])   . Migraine   . MVP (mitral valve prolapse) 1990   psa  . Rosacea   . Ulcer    as a child   Past Surgical History:  Procedure Laterality Date  . ABDOMINAL HYSTERECTOMY  1985  . BREAST BIOPSY Left    secondary to infection  . I & D EXTREMITY Right 09/09/2012   Procedure: Minor IRRIGATION AND DEBRIDEMENT EXTREMITY paronychia of right thumb;  Surgeon: Wynonia Sours, MD;  Location: [facility];  Service: Orthopedics;  Laterality: Right;  . I & D EXTREMITY Left 01/26/2014   Procedure: MINOR IINCISION AND DRAINAGE paronychia left index finger;  Surgeon: Daryll Brod, MD;  Location: [facility];  Service: Orthopedics;  Laterality: Left;  left index  . OOPHORECTOMY Left 1994  . REPAIR KNEE LIGAMENT Left 1989  . TONSILLECTOMY  1955  . TUBAL LIGATION    . VEIN SURGERY     Social History: Married. Retired. She has 2 daughters.   reports that she has never smoked. She has never used smokeless tobacco. She reports current alcohol use of about 1.0 standard drink of alcohol per week. She reports that she does not use  drugs. family history includes Colitis in her father; Colon polyps in her father and mother; Esophageal cancer in her maternal aunt; Heart disease in her father and mother. Allergies  Allergen Reactions  . Codeine   . Compazine   . Gabapentin   . Prednisone     REACTION: makes heart beat hard \\T \ fast  . Prochlorperazine Edisylate   . Sulfa Antibiotics   . Sulfonamide Derivatives       Outpatient Encounter Medications as of 07/20/2019  Medication Sig  . aspirin 81 MG tablet Take 81 mg by mouth. ON OCCASION  . atenolol (TENORMIN) 25 MG tablet One half tablet when necessary for palpitations    . BIOTIN PO Take by mouth.  . calcium-vitamin D 250-100 MG-UNIT per tablet Take 1 tablet by mouth daily.    Marland Kitchen docusate sodium (COLACE) 100 MG capsule Take 100 mg by mouth daily. Daily at bedtime  . doxycycline (VIBRA-TABS) 100 MG tablet TAKE ONE TABLET DAILY AS DIRECTED  . estradiol (ESTRACE) 0.5 MG tablet Take 0.5 mg by mouth daily.  . hydrocortisone (ANUSOL-HC) 2.5 % rectal cream Place 1 application rectally 2 (two) times daily.  Marland Kitchen lidocaine-prilocaine (EMLA) cream Apply 1 application topically as needed.  . meloxicam (MOBIC) 15 MG tablet   . NON FORMULARY Hydro eye  . omeprazole (PRILOSEC) 20 MG capsule Take 1 capsule (20 mg total) by mouth daily. AS NEEDED  . vitamin E 100 UNIT capsule Take 100 Units by mouth daily.    Marland Kitchen zolmitriptan (ZOMIG ZMT) 2.5 MG disintegrating tablet Take 1 tablet (2.5 mg total) by mouth as needed for migraine.  . [DISCONTINUED] Ascorbic Acid (VITAMIN C) 100 MG tablet Take 100 mg by mouth daily.    . [DISCONTINUED] traZODone (DESYREL) 50 MG tablet Take 1/4 to 1/2 tab at night as needed for sleep.   No facility-administered encounter medications on file as of 07/20/2019.     REVIEW OF SYSTEMS:  Gen: Denies fever, sweats or chills. No weight loss.  CV: Denies chest pain, palpitations or edema. Resp: Denies cough, shortness of breath of hemoptysis.  GI: See HPI. GU : Denies urinary burning, blood in urine, increased urinary frequency or incontinence. MS: Denies joint pain, muscles aches or weakness. Derm: Denies rash, itchiness, skin lesions or unhealing ulcers. Psych: Denies depression, anxiety, memory loss, suicidal ideation and confusion. Heme: Denies bruising, bleeding. Neuro:  Denies headaches, dizziness or paresthesias. Endo:  Denies any problems with DM, thyroid or adrenal function.    PHYSICAL EXAM: BP 128/74   Pulse 81   Ht 5\' 2"  (1.575 m)   Wt 115 lb (52.2 kg)   BMI 21.03 kg/m  General: Well developed 69 year old female in no acute  distress. Head: Normocephalic and atraumatic. Eyes:  Sclerae non-icteric, conjunctive pink. Ears: Normal auditory acuity. Mouth: Dentition intact. No ulcers or lesions.  Neck: Supple, no lymphadenopathy or thyromegaly.  Lungs: Clear bilaterally to auscultation without wheezes, crackles or rhonchi. Heart: Regular rate and rhythm. No murmur, rub or gallop appreciated.  Abdomen: Soft, non distended. Moderate tenderness above the umbilicus.  No masses. No hepatosplenomegaly. Normoactive bowel sounds x 4 quadrants. Minimal adipose fold to lower abdomen.  Rectal: Defered Musculoskeletal: Symmetrical with no gross deformities. Skin: Warm and dry. No rash or lesions on visible extremities. Extremities: No edema. Neurological: Alert oriented x 4, no focal deficits.  Psychological:  Alert and cooperative. Normal mood and affect.  ASSESSMENT AND PLAN:  74. 69 year old female with concerns of a change  in her abdominal wall appearance consistent with a mild adipose fold to the lower abdomen. Small umbilical hernia.  -CBC, CMP -CTAP with contrast for reassurance and to evaluate the central abdomen due to tenderness on exam today -Follow up with general surgery as needed regarding umbilical hernia, possible future surgical repair  2. History of a tubulovillous rectal polyp in 2014. Family history of colon polyps. -Colonoscopy benefits and risks discussed including risk with sedation, risk of bleeding, perforation and infection          CC:  [name],*

## 2019-07-20 NOTE — Patient Instructions (Addendum)
If you are age 71 or older, your body mass index should be between 23-30. Your Body mass index is 21.03 kg/m. If this is out of the aforementioned range listed, please consider follow up with your Primary Care Provider.  If you are age 31 or younger, your body mass index should be between 19-25. Your Body mass index is 21.03 kg/m. If this is out of the aformentioned range listed, please consider follow up with your Primary Care Provider.   We have sent the following medications to your pharmacy for you to pick up at your convenience:  [place] are scheduled on 08/08/2019 at 9:45 am [employer]. You should arrive 15 minutes prior to your appointment time for registration. Please follow the written instructions below on the day of your exam:  WARNING: IF YOU ARE ALLERGIC TO IODINE/X-RAY DYE, PLEASE NOTIFY RADIOLOGY IMMEDIATELY AT [phone]! YOU WILL BE GIVEN A 13 HOUR PREMEDICATION PREP.  1) Do not eat or drink anything after 5:45am  (4 hours prior to your test) 2) You have been given 2 bottles of oral contrast to drink. The solution may taste better if refrigerated, but do NOT add ice or any other liquid to this solution. Shake well before drinking.    Drink 1 bottle of contrast @ 7:45 am(2 hours prior to your exam)  Drink 1 bottle of contrast @ 8:45 am (1 hour prior to your exam)  You may take any medications as prescribed with a small amount of water, if necessary. If you take any of the following medications: METFORMIN, GLUCOPHAGE, GLUCOVANCE, AVANDAMET, RIOMET, FORTAMET, [place] MET, JANUMET, GLUMETZA or METAGLIP, you MAY be asked to HOLD this medication 48 hours AFTER the exam.  The purpose of you drinking the oral contrast is to aid in the visualization of your intestinal tract. The contrast solution may cause some diarrhea. Depending on your individual set of symptoms, you may also receive an intravenous injection of x-ray contrast/dye. Plan on being at [facility] for 30  minutes or longer, depending on the type of exam you are having performed.  This test typically takes 30-45 minutes to complete.  Your provider has requested that you go to the basement level for lab work before leaving today. Press "B" on the elevator. The lab is located at the first door on the left as you exit the elevator.'  Due to recent changes in healthcare laws, you may see the results of your imaging and laboratory studies on MyChart before your provider has had a chance to review them.  We understand that in some cases there may be results that are confusing or concerning to you. Not all laboratory results come back in the same time frame and the provider may be waiting for multiple results in order to interpret others.  Please give [place] 48 hours in order for your provider to thoroughly review all the results before contacting the office for clarification of your results.   Thank you for choosing [facility] Noralyn Pick, CRNP

## 2019-07-22 NOTE — Progress Notes (Signed)
Reviewed

## 2019-08-08 ENCOUNTER — Telehealth: Payer: Self-pay | Admitting: Family Medicine

## 2019-08-08 ENCOUNTER — Encounter (HOSPITAL_COMMUNITY): Payer: Self-pay

## 2019-08-08 ENCOUNTER — Ambulatory Visit (HOSPITAL_COMMUNITY)
Admission: RE | Admit: 2019-08-08 | Discharge: 2019-08-08 | Disposition: A | Discharge status: Home / Self Care | Payer: Medicare PPO | Source: Ambulatory Visit | Attending: Nurse Practitioner | Admitting: Nurse Practitioner

## 2019-08-08 ENCOUNTER — Other Ambulatory Visit: Payer: Self-pay

## 2019-08-08 DIAGNOSIS — N200 Calculus of kidney: Secondary | ICD-10-CM | POA: Diagnosis not present

## 2019-08-08 DIAGNOSIS — K429 Umbilical hernia without obstruction or gangrene: Secondary | ICD-10-CM | POA: Diagnosis not present

## 2019-08-08 DIAGNOSIS — R140 Abdominal distension (gaseous): Secondary | ICD-10-CM

## 2019-08-08 DIAGNOSIS — Z90710 Acquired absence of both cervix and uterus: Secondary | ICD-10-CM | POA: Diagnosis not present

## 2019-08-08 DIAGNOSIS — R1013 Epigastric pain: Secondary | ICD-10-CM

## 2019-08-08 DIAGNOSIS — Z860100 Personal history of colon polyps, unspecified: Secondary | ICD-10-CM

## 2019-08-08 MED ORDER — SODIUM CHLORIDE (PF) 0.9 % IJ SOLN
INTRAMUSCULAR | Status: AC
  Filled 2019-08-08: qty 50

## 2019-08-08 MED ORDER — IOHEXOL 300 MG/ML  SOLN
100.0000 mL | Freq: Once | INTRAMUSCULAR | Status: AC | PRN
  Administered 2019-08-08: 100 mL via INTRAVENOUS

## 2019-08-08 NOTE — Telephone Encounter (Signed)
As far as what we have listed for the calcium supplement. You are not taking much. Last bone scan was from 2017 and it showed mild osteoporosis. I think that you can hold your calcium for now. Be sure to remind me to address this with you when I see you next.

## 2019-08-08 NOTE — Telephone Encounter (Signed)
Patient states that she had a CT Scan done today and she was told that her Calcium levels were elevated. She was told to call her PCP to see if she needs to cut back on her Calcium medication.  Please call her back at [phone] and advise.  Thank you, Burley Saver

## 2019-08-08 NOTE — Telephone Encounter (Signed)
Dr. Ethelene Hal please advise message below. Should patient cut back on calcium supplements? Please advise.

## 2019-08-09 NOTE — Telephone Encounter (Signed)
Called patient to schedule appointment no answer LM informing patient to give [place] a call to schedule virtual visit

## 2019-08-09 NOTE — Telephone Encounter (Signed)
Patient aware of message below and will hold on calcium then follow up with you at next visit. Patient would like for Dr. Ethelene Hal to view CT and let her know your thoughts on this. Please advise.

## 2019-08-09 NOTE — Telephone Encounter (Signed)
Let's do a virtual.  

## 2019-09-15 ENCOUNTER — Encounter: Payer: Self-pay | Admitting: Internal Medicine

## 2019-09-25 ENCOUNTER — Telehealth: Payer: Self-pay | Admitting: Nurse Practitioner

## 2019-09-25 NOTE — Telephone Encounter (Signed)
Patent is calling said she whiten her teeth last night and would like to know if that will effect her in any way with the prep medication.

## 2019-09-25 NOTE — Telephone Encounter (Signed)
Dr Henrene Pastor This patient will have a colonoscopy with you this week on 09/27/19. She called to be certain the teeth whitening ingredients would not interact with her colonoscopy prep. She was concerned about anything she may have inadvertently swallowed.   More importantly, she has told me of her extreme claustrophobia which makes it impossible for her to ride in elevators. She asks to be allowed to come down the stairs post procedure. She wanted you to be aware of this.  Very nice patient.

## 2019-09-25 NOTE — Telephone Encounter (Signed)
Noted  

## 2019-09-26 ENCOUNTER — Telehealth: Payer: Self-pay | Admitting: Internal Medicine

## 2019-09-26 NOTE — Telephone Encounter (Signed)
Pt is scheduled to see Dr. Henrene Pastor on 09/27/19 at 0800. Pt wanted to know if she could take tylenol this evening if she got a headache from the [place]. Also went over prep instructions with patient. Advised her to call office back if she had any other concerns. Patient verbalized understanding.

## 2019-09-27 ENCOUNTER — Encounter: Payer: Self-pay | Admitting: Internal Medicine

## 2019-09-27 ENCOUNTER — Other Ambulatory Visit: Payer: Self-pay

## 2019-09-27 ENCOUNTER — Ambulatory Visit (AMBULATORY_SURGERY_CENTER): Payer: Medicare PPO | Admitting: Internal Medicine

## 2019-09-27 ENCOUNTER — Telehealth: Payer: Self-pay | Admitting: Internal Medicine

## 2019-09-27 VITALS — BP 119/59 | HR 86 | Temp 97.3°F | Resp 18 | Ht 62.0 in | Wt 115.0 lb

## 2019-09-27 DIAGNOSIS — Z86010 Personal history of colonic polyps: Secondary | ICD-10-CM | POA: Diagnosis not present

## 2019-09-27 DIAGNOSIS — D124 Benign neoplasm of descending colon: Secondary | ICD-10-CM

## 2019-09-27 DIAGNOSIS — R1013 Epigastric pain: Secondary | ICD-10-CM

## 2019-09-27 DIAGNOSIS — R140 Abdominal distension (gaseous): Secondary | ICD-10-CM | POA: Diagnosis not present

## 2019-09-27 DIAGNOSIS — R1310 Dysphagia, unspecified: Secondary | ICD-10-CM | POA: Diagnosis not present

## 2019-09-27 DIAGNOSIS — D125 Benign neoplasm of sigmoid colon: Secondary | ICD-10-CM

## 2019-09-27 MED ORDER — SODIUM CHLORIDE 0.9 % IV SOLN: 500.0000 mL | Freq: Once | INTRAVENOUS | Status: DC

## 2019-09-27 NOTE — Progress Notes (Signed)
Called to room to assist during endoscopic procedure.  Patient ID and intended procedure confirmed with present staff. Received instructions for my participation in the procedure from the performing physician.  

## 2019-09-27 NOTE — Op Note (Signed)
[place] Patient Name: [name] Procedure Date: 09/27/2019 7:46 AM MRN: [id number] Endoscopist: Docia Chuck. Henrene Pastor , MD Age: 69 Referring MD:  Date of Birth: 1950-10-12 Gender: Female Account #: [id number] Procedure:                Colonoscopy with cold snare polypectomy x 2 Indications:              High risk colon cancer surveillance: Personal                            history of adenoma (10 mm or greater in size), High                            risk colon cancer surveillance: Personal history of                            adenoma with villous component previous                            examinations with Dr. Maurene Capes in 2004 (negative for                            neoplasia) and 2014 (15 mm tubulovillous adenoma).                            Recent office evaluation for abdominal bloating.                            Negative work-up including laboratories and CT. Medicines:                Monitored Anesthesia Care Procedure:                Pre-Anesthesia Assessment:                           - Prior to the procedure, a History and Physical                            was performed, and patient medications and                            allergies were reviewed. The patient's tolerance of                            previous anesthesia was also reviewed. The risks                            and benefits of the procedure and the sedation                            options and risks were discussed with the patient.                            All questions were answered, and informed consent  was obtained. Prior Anticoagulants: The patient has                            taken no previous anticoagulant or antiplatelet                            agents. After reviewing the risks and benefits, the                            patient was deemed in satisfactory condition to                            undergo the procedure.                           After  obtaining informed consent, the colonoscope                            was passed under direct vision. Throughout the                            procedure, the patient's blood pressure, pulse, and                            oxygen saturations were monitored continuously. The                            Colonoscope was introduced through the anus and                            advanced to the the cecum, identified by                            appendiceal orifice and ileocecal valve. The                            ileocecal valve, appendiceal orifice, and rectum                            were photographed. The quality of the bowel                            preparation was excellent. The colonoscopy was                            performed without difficulty. The patient tolerated                            the procedure well. The bowel preparation used was                            SUPREP via split dose instruction. Scope In: 8:40:24 AM Scope Out: 8:54:33 AM Total Procedure Duration: 0 hours 14 minutes 9 seconds  Findings:  Two polyps were found in the sigmoid colon and                            descending colon. The polyps were 3 to 4 mm in                            size. These polyps were removed with a cold snare.                            Resection and retrieval were complete.                           A few small-mouthed diverticula were found in the                            sigmoid colon.                           The exam was otherwise without abnormality on                            direct and retroflexion views. Complications:            No immediate complications. Estimated blood loss:                            None. Estimated Blood Loss:     Estimated blood loss: none. Impression:               - Two 3 to 4 mm polyps in the sigmoid colon and in                            the descending colon, removed with a cold snare.                            Resected  and retrieved.                           - Diverticulosis in the sigmoid colon.                           - The examination was otherwise normal on direct                            and retroflexion views. Recommendation:           - Repeat colonoscopy in 5 years for surveillance                            (personal history of advanced adenoma).                           - Patient has a contact number available for  emergencies. The signs and symptoms of potential                            delayed complications were discussed with the                            patient. Return to normal activities tomorrow.                            Written discharge instructions were provided to the                            patient.                           - Resume previous diet.                           - Continue present medications.                           - Await pathology results. Docia Chuck. Henrene Pastor, MD 09/27/2019 9:05:12 AM This report has been signed electronically.

## 2019-09-27 NOTE — Progress Notes (Signed)
pt tolerated well. VSS. awake and to recovery. Report given to RN.  

## 2019-09-27 NOTE — Patient Instructions (Signed)
Please read handouts provided. Continue present medications. Await pathology results.   YOU HAD AN ENDOSCOPIC PROCEDURE TODAY AT THE [facility]:   Refer to the procedure report that was given to you for any specific questions about what was found during the examination.  If the procedure report does not answer your questions, please call your gastroenterologist to clarify.  If you requested that your care partner not be given the details of your procedure findings, then the procedure report has been included in a sealed envelope for you to review at your convenience later.  YOU SHOULD EXPECT: Some feelings of bloating in the abdomen. Passage of more gas than usual.  Walking can help get rid of the air that was put into your GI tract during the procedure and reduce the bloating. If you had a lower endoscopy (such as a colonoscopy or flexible sigmoidoscopy) you may notice spotting of blood in your stool or on the toilet paper. If you underwent a bowel prep for your procedure, you may not have a normal bowel movement for a few days.  Please Note:  You might notice some irritation and congestion in your nose or some drainage.  This is from the oxygen used during your procedure.  There is no need for concern and it should clear up in a day or so.  SYMPTOMS TO REPORT IMMEDIATELY:  Following lower endoscopy (colonoscopy or flexible sigmoidoscopy):  Excessive amounts of blood in the stool  Significant tenderness or worsening of abdominal pains  Swelling of the abdomen that is new, acute  Fever of 100F or higher   For urgent or emergent issues, a gastroenterologist can be reached at any hour by calling [phone]. Do not use MyChart messaging for urgent concerns.    DIET:  We do recommend a small meal at first, but then you may proceed to your regular diet.  Drink plenty of fluids but you should avoid alcoholic beverages for 24 hours.  ACTIVITY:  You should plan to take it easy  for the rest of today and you should NOT DRIVE or use heavy machinery until tomorrow (because of the sedation medicines used during the test).    FOLLOW UP: Our staff will call the number listed on your records 48-72 hours following your procedure to check on you and address any questions or concerns that you may have regarding the information given to you following your procedure. If we do not reach you, we will leave a message.  We will attempt to reach you two times.  During this call, we will ask if you have developed any symptoms of COVID 19. If you develop any symptoms (ie: fever, flu-like symptoms, shortness of breath, cough etc.) before then, please call [phone].  If you test positive for Covid 19 in the 2 weeks post procedure, please call and report this information to us.    If any biopsies were taken you will be contacted by phone or by letter within the next 1-3 weeks.  Please call us at [phone] if you have not heard about the biopsies in 3 weeks.    SIGNATURES/CONFIDENTIALITY: You and/or your care partner have signed paperwork which will be entered into your electronic medical record.  These signatures attest to the fact that that the information above on your After Visit Summary has been reviewed and is understood.  Full responsibility of the confidentiality of this discharge information lies with you and/or your care-partner.  

## 2019-09-27 NOTE — Progress Notes (Signed)
Patient states her IV feels uncomfortable and swollen. IV wide open, aspirated excellent blood return with 10cc syringe without difficulty. CRNA appeared at bedside to obtain patient while I was doing this and is aware. Both agree IV looks good.

## 2019-09-27 NOTE — Telephone Encounter (Signed)
Pt states that she has had a bowel movement and it was dark in color.  She says that she does not see bright red blood and that it looked like a normal solid BM. She is not having pain or fever or any other symptoms.  I explained to her that she should not be concerned about this but to call us if she starts to have any symptoms listed on her discharge instructions.

## 2019-09-29 ENCOUNTER — Telehealth: Payer: Self-pay

## 2019-09-29 ENCOUNTER — Encounter: Payer: Self-pay | Admitting: Internal Medicine

## 2019-09-29 ENCOUNTER — Telehealth: Payer: Self-pay | Admitting: *Deleted

## 2019-09-29 NOTE — Telephone Encounter (Signed)
First follow up call made. Left message. 

## 2019-09-29 NOTE — Telephone Encounter (Signed)
Patient called states she is well has a very little bit of diarrhea but she is well and has no questions at this time.

## 2019-09-29 NOTE — Telephone Encounter (Signed)
  Follow up Call-  Call back number 09/27/2019  Post procedure Call Back phone  # [phone]  Permission to leave phone message Yes  Some recent data might be hidden     Patient questions:  Do you have a fever, pain , or abdominal swelling? No. Pain Score  0 *  Have you tolerated food without any problems? Yes.    Have you been able to return to your normal activities? Yes.    Do you have any questions about your discharge instructions: Diet   No. Medications  No. Follow up visit  No.  Do you have questions or concerns about your Care? No.  Actions: * If pain score is 4 or above: No action needed, pain <4.  1. Have you developed a fever since your procedure? no  2.   Have you had an respiratory symptoms (SOB or cough) since your procedure? no  3.   Have you tested positive for COVID 19 since your procedure no  4.   Have you had any family members/close contacts diagnosed with the COVID 19 since your procedure?  no   If yes to any of these questions please route to Joylene John, RN and Joella Prince, RN

## 2019-09-29 NOTE — Telephone Encounter (Signed)
2nd follow up call made.  NAULM 

## 2019-10-03 ENCOUNTER — Telehealth: Payer: Self-pay | Admitting: Internal Medicine

## 2019-10-03 NOTE — Telephone Encounter (Signed)
Path letter reviewed with pt and questions were answered.

## 2019-10-05 ENCOUNTER — Ambulatory Visit (INDEPENDENT_AMBULATORY_CARE_PROVIDER_SITE_OTHER): Payer: Medicare PPO

## 2019-10-05 VITALS — BP 122/76 | Ht 61.0 in | Wt 112.0 lb

## 2019-10-05 DIAGNOSIS — Z0000 Encounter for general adult medical examination without abnormal findings: Secondary | ICD-10-CM | POA: Diagnosis not present

## 2019-10-05 NOTE — Progress Notes (Signed)
Subjective:   [name] is a 69 y.o. female who presents for Medicare Annual (Subsequent) preventive examination.  I connected with [name] today by telephone and verified that I am speaking with the correct person using two identifiers. Location patient: home Location provider: work Persons participating in the virtual visit: patient, Marine scientist.    I discussed the limitations, risks, security and privacy concerns of performing an evaluation and management service by telephone and the availability of in person appointments. I also discussed with the patient that there may be a patient responsible charge related to this service. The patient expressed understanding and verbally consented to this telephonic visit.    Interactive audio and video telecommunications were attempted between this provider and patient, however failed, due to patient having technical difficulties OR patient did not have access to video capability.  We continued and completed visit with audio only.  Some vital signs may be absent or patient reported.   Time Spent with patient on telephone encounter: 40 minutes  Review of Systems     Cardiac Risk Factors include: advanced age (>70men, >42 women)     Objective:    Today's Vitals   10/05/19 1114  BP: 122/76  Weight: 112 lb (50.8 kg)  Height: 5\' 1"  (1.549 m)   Body mass index is 21.16 kg/m.  Advanced Directives 10/05/2019 02/22/2018 11/18/2015 05/31/2014  Does Patient Have a Medical Advance Directive? Yes No No No  Type of Paramedic of [place];Living will - - -  Copy of [place] in Chart? No - copy requested - - -  Would patient like information on creating a medical advance directive? - - - No - patient declined information    Current Medications (verified) Outpatient Encounter Medications as of 10/05/2019  Medication Sig  . aspirin 81 MG tablet Take 81 mg by mouth. ON OCCASION  . calcium-vitamin D 250-100  MG-UNIT per tablet Take 1 tablet by mouth daily.    Marland Kitchen doxycycline (VIBRA-TABS) 100 MG tablet TAKE ONE TABLET DAILY AS DIRECTED  . estradiol (ESTRACE) 0.5 MG tablet Take 0.5 mg by mouth daily.  . meloxicam (MOBIC) 15 MG tablet   . NON FORMULARY Hydro eye  . vitamin E 100 UNIT capsule Take 100 Units by mouth daily.    Marland Kitchen zolmitriptan (ZOMIG ZMT) 2.5 MG disintegrating tablet Take 1 tablet (2.5 mg total) by mouth as needed for migraine.  Marland Kitchen atenolol (TENORMIN) 25 MG tablet One half tablet when necessary for palpitations (Patient not taking: Reported on 09/27/2019)  . docusate sodium (COLACE) 100 MG capsule Take 100 mg by mouth daily. Daily at bedtime (Patient not taking: Reported on 10/05/2019)  . hydrocortisone (ANUSOL-HC) 2.5 % rectal cream Place 1 application rectally 2 (two) times daily. (Patient not taking: Reported on 10/05/2019)  . lidocaine-prilocaine (EMLA) cream Apply 1 application topically as needed. (Patient not taking: Reported on 10/05/2019)  . omeprazole (PRILOSEC) 20 MG capsule Take 1 capsule (20 mg total) by mouth daily. AS NEEDED (Patient not taking: Reported on 10/05/2019)  . [DISCONTINUED] BIOTIN PO Take by mouth. (Patient not taking: Reported on 09/27/2019)   No facility-administered encounter medications on file as of 10/05/2019.    Allergies (verified) Codeine, Compazine, Gabapentin, Prednisone, Prochlorperazine edisylate, Sulfa antibiotics, and Sulfonamide derivatives   History: Past Medical History:  Diagnosis Date  . Anal fissure   . Anemia    during pregnancy  . Anxiety   . Basal cell carcinoma   . DDD (degenerative disc  disease)   . Diverticulosis   . Hypokalemia   . LBP (low back pain)   . Melanoma ([place])   . Migraine   . MVP (mitral valve prolapse) 1990   psa  . Rosacea   . Ulcer    as a child   Past Surgical History:  Procedure Laterality Date  . ABDOMINAL HYSTERECTOMY  1985  . BREAST BIOPSY Left    secondary to infection  . I & D EXTREMITY Right  09/09/2012   Procedure: Minor IRRIGATION AND DEBRIDEMENT EXTREMITY paronychia of right thumb;  Surgeon: Wynonia Sours, MD;  Location: [facility];  Service: Orthopedics;  Laterality: Right;  . I & D EXTREMITY Left 01/26/2014   Procedure: MINOR IINCISION AND DRAINAGE paronychia left index finger;  Surgeon: Daryll Brod, MD;  Location: [facility];  Service: Orthopedics;  Laterality: Left;  left index  . OOPHORECTOMY Left 1994  . REPAIR KNEE LIGAMENT Left 1989  . TONSILLECTOMY  1955  . TUBAL LIGATION    . VEIN SURGERY     Family History  Problem Relation Age of Onset  . Heart disease Mother   . Colon polyps Mother   . Heart disease Father   . Colitis Father   . Colon polyps Father   . Esophageal cancer Maternal Aunt   . Colon cancer Neg Hx    Social History   Socioeconomic History  . Marital status: Married    Spouse name: Not on file  . Number of children: 2  . Years of education: Not on file  . Highest education level: Not on file  Occupational History  . Occupation: retired Conservation officer, historic buildings  Tobacco Use  . Smoking status: Never Smoker  . Smokeless tobacco: Never Used  Vaping Use  . Vaping Use: Never used  Substance and Sexual Activity  . Alcohol use: Yes    Alcohol/week: 1.0 standard drink    Types: 1 Standard drinks or equivalent per week    Comment: social wine  . Drug use: No  . Sexual activity: Yes  Other Topics Concern  . Not on file  Social History Narrative  . Not on file   Social Determinants of Health   Financial Resource Strain: Low Risk   . Difficulty of Paying Living Expenses: Not hard at all  Food Insecurity: No Food Insecurity  . Worried About Charity fundraiser in the Last Year: Never true  . Ran Out of Food in the Last Year: Never true  Transportation Needs: No Transportation Needs  . Lack of Transportation (Medical): No  . Lack of Transportation (Non-Medical): No  Physical Activity: Sufficiently Active  . Days of  Exercise per Week: 4 days  . Minutes of Exercise per Session: 40 min  Stress: No Stress Concern Present  . Feeling of Stress : Only a little  Social Connections: Socially Integrated  . Frequency of Communication with Friends and Family: More than three times a week  . Frequency of Social Gatherings with Friends and Family: Once a week  . Attends Religious Services: More than 4 times per year  . Active Member of Clubs or Organizations: Yes  . Attends Archivist Meetings: More than 4 times per year  . Marital Status: Married    Tobacco Counseling Counseling given: Not Answered   Clinical Intake:  Pre-visit preparation completed: Yes  Pain : No/denies pain     Nutritional Status: BMI of 19-24  Normal Nutritional Risks: None Diabetes: No  How often do you need to have someone help you when you read instructions, pamphlets, or other written materials from your doctor or pharmacy?: 1 - Never What is the last grade level you completed in school?: Associate's degree  Diabetic?No  Interpreter Needed?: No  Information entered by :: Caroleen Hamman LPN   Activities of Daily Living In your present state of health, do you have any difficulty performing the following activities: 10/05/2019  Hearing? N  Vision? N  Difficulty concentrating or making decisions? N  Walking or climbing stairs? N  Dressing or bathing? N  Doing errands, shopping? N  Preparing Food and eating ? N  Using the Toilet? N  In the past six months, have you accidently leaked urine? N  Do you have problems with loss of bowel control? N  Managing your Medications? N  Managing your Finances? N  Housekeeping or managing your Housekeeping? N  Some recent data might be hidden    Patient Care Team: Libby Maw, MD as PCP - General (Family Medicine)  Indicate any recent Medical Services you may have received from other than [facility] providers in the past year (date may be approximate).       Assessment:   This is a routine wellness examination for [name].  Hearing/Vision screen  Hearing Screening   125Hz  250Hz  500Hz  1000Hz  2000Hz  3000Hz  4000Hz  6000Hz  8000Hz   Right ear:           Left ear:           Comments: No issues  Vision Screening Comments: Last eye exam-07/2019-Dr Digby   Dietary issues and exercise activities discussed: Current Exercise Habits: Home exercise routine, Type of exercise: walking, Time (Minutes): 45, Frequency (Times/Week): 4, Weekly Exercise (Minutes/Week): 180, Exercise limited by: None identified  Goals    . Patient Stated     Drink more water      Depression Screen PHQ 2/9 Scores 10/05/2019 03/09/2019 03/09/2019 02/23/2018 02/18/2016 01/08/2015 12/28/2013  PHQ - 2 Score 1 0 0 2 0 0 0  PHQ- 9 Score - 6 - 10 - - -    Fall Risk Fall Risk  10/05/2019 03/09/2019 02/18/2016 01/08/2015 12/28/2013  Falls in the past year? 0 0 No No No  Number falls in past yr: 0 - - - -  Injury with Fall? 0 - - - -  Follow up Falls prevention discussed - - - -    Any stairs in or around the home? Yes  If so, are there any without handrails? No  Home free of loose throw rugs in walkways, pet beds, electrical cords, etc? Yes  Adequate lighting in your home to reduce risk of falls? Yes   ASSISTIVE DEVICES UTILIZED TO PREVENT FALLS:  Life alert? No  Use of a cane, walker or w/c? No  Grab bars in the bathroom? Yes  Shower chair or bench in shower? No  Elevated toilet seat or a handicapped toilet? No   TIMED UP AND GO:  Was the test performed? No . Phone visit   Cognitive Function: No cognitive impairment noted.        Immunizations Immunization History  Administered Date(s) Administered  . Influenza Split 12/31/2010, 11/23/2012  . Influenza Whole 11/11/2006, 11/14/2007, 11/30/2008  . Influenza, High Dose Seasonal PF 12/06/2014, 11/18/2015, 11/16/2016  . Influenza,inj,Quad PF,6+ Mos 11/22/2017  . Influenza-Unspecified 11/23/2012, 11/22/2013, 11/22/2017   . PFIZER SARS-COV-2 Vaccination 03/03/2019, 03/24/2019  . Pneumococcal Conjugate-13 12/28/2013  . Pneumococcal Polysaccharide-23 01/08/2015  . Td 02/09/2001  .  Tdap 12/22/2011  . Zoster 07/14/2011    TDAP status: Up to date   Flu Vaccine status: Up to date Due 10/2019  Pneumococcal vaccine status: Up to date   Covid-19 vaccine status: Completed vaccines  Qualifies for Shingles Vaccine? Yes   Zostavax completed Yes   Shingrix Completed?: No.    Education has been provided regarding the importance of this vaccine. Patient has been advised to call insurance company to determine out of pocket expense if they have not yet received this vaccine. Advised may also receive vaccine at local pharmacy or Health Dept. Verbalized acceptance and understanding.  Screening Tests Health Maintenance  Topic Date Due  . INFLUENZA VACCINE  09/10/2019  . MAMMOGRAM  05/01/2021  . TETANUS/TDAP  12/21/2021  . COLONOSCOPY  09/26/2024  . DEXA SCAN  Completed  . COVID-19 Vaccine  Completed  . Hepatitis C Screening  Completed  . PNA vac Low Risk Adult  Completed    Health Maintenance  Health Maintenance Due  Topic Date Due  . INFLUENZA VACCINE  09/10/2019    Colorectal cancer screening: Completed 09/27/2019. Repeat every 5 years   Mammogram status: Completed 05/02/2019. Repeat every year   Bone Density status: Completed 03/08/2015. Results reflect: Bone density results: NORMAL. Repeat every 2 years.  Ordered today. Patient aware that someone will be calling her to schedule.  Lung Cancer Screening: (Low Dose CT Chest recommended if Age 54-80 years, 30 pack-year currently smoking OR have quit w/in 15years.) does not qualify.    Additional Screening:  Hepatitis C Screening:Completed 01/30/2015  Vision Screening: Recommended annual ophthalmology exams for early detection of glaucoma and other disorders of the eye. Is the patient up to date with their annual eye exam?  Yes  Who is the provider or  what is the name of the office in which the patient attends annual eye exams? Dr. Bing Plume   Dental Screening: Recommended annual dental exams for proper oral hygiene  Community Resource Referral / Chronic Care Management: CRR required this visit?  No   CCM required this visit?  No      Plan:     I have personally reviewed and noted the following in the patient's chart:   . Medical and social history . Use of alcohol, tobacco or illicit drugs  . Current medications and supplements . Functional ability and status . Nutritional status . Physical activity . Advanced directives . List of other physicians . Hospitalizations, surgeries, and ER visits in previous 12 months . Vitals . Screenings to include cognitive, depression, and falls . Referrals and appointments  In addition, I have reviewed and discussed with patient certain preventive protocols, quality metrics, and best practice recommendations. A written personalized care plan for preventive services as well as general preventive health recommendations were provided to patient.    Due to this being a telephonic visit, the after visit summary with patients personalized plan was offered to patient via mail or my-chart.  Patient would like to access on my-chart.   Marta Antu, LPN   09/22/4816  Nurse Health Advisor  Nurse Notes: None

## 2019-10-05 NOTE — Patient Instructions (Signed)
[name] , Thank you for taking time to complete your Medicare Wellness Visit. I appreciate your ongoing commitment to your health goals. Please review the following plan we discussed and let me know if I can assist you in the future.   Screening recommendations/referrals: Colonoscopy: Completed 09/27/2019- Due 09/26/2024 Mammogram: Completed 05/02/2019- Due-05/01/2020 Bone Density: Ordered today-Someone will be calling you to schedule. Recommended yearly ophthalmology/optometry visit for glaucoma screening and checkup Recommended yearly dental visit for hygiene and checkup  Vaccinations: Influenza vaccine: Due -10/2019 Pneumococcal vaccine: Completed vaccines Tdap vaccine: Up to Date- Due-12/21/2021 Shingles vaccine: Discuss with pharmacy  Covid-19:Completed vaccines  Advanced directives: Please bring a copy to your next office viist.  Conditions/risks identified: See problem list  Next appointment: Follow up in one year for your annual wellness visit  10/10/2020 @ 11:15am.   Preventive Care 65 Years and Older, Female Preventive care refers to lifestyle choices and visits with your health care provider that can promote health and wellness. What does preventive care include?  A yearly physical exam. This is also called an annual well check.  Dental exams once or twice a year.  Routine eye exams. Ask your health care provider how often you should have your eyes checked.  Personal lifestyle choices, including:  Daily care of your teeth and gums.  Regular physical activity.  Eating a healthy diet.  Avoiding tobacco and drug use.  Limiting alcohol use.  Practicing safe sex.  Taking low-dose aspirin every day.  Taking vitamin and mineral supplements as recommended by your health care provider. What happens during an annual well check? The services and screenings done by your health care provider during your annual well check will depend on your age, overall health, lifestyle  risk factors, and family history of disease. Counseling  Your health care provider may ask you questions about your:  Alcohol use.  Tobacco use.  Drug use.  Emotional well-being.  Home and relationship well-being.  Sexual activity.  Eating habits.  History of falls.  Memory and ability to understand (cognition).  Work and work Statistician.  Reproductive health. Screening  You may have the following tests or measurements:  Height, weight, and BMI.  Blood pressure.  Lipid and cholesterol levels. These may be checked every 5 years, or more frequently if you are over 46 years old.  Skin check.  Lung cancer screening. You may have this screening every year starting at age 36 if you have a 30-pack-year history of smoking and currently smoke or have quit within the past 15 years.  Fecal occult blood test (FOBT) of the stool. You may have this test every year starting at age 31.  Flexible sigmoidoscopy or colonoscopy. You may have a sigmoidoscopy every 5 years or a colonoscopy every 10 years starting at age 17.  Hepatitis C blood test.  Hepatitis B blood test.  Sexually transmitted disease (STD) testing.  Diabetes screening. This is done by checking your blood sugar (glucose) after you have not eaten for a while (fasting). You may have this done every 1-3 years.  Bone density scan. This is done to screen for osteoporosis. You may have this done starting at age 63.  Mammogram. This may be done every 1-2 years. Talk to your health care provider about how often you should have regular mammograms. Talk with your health care provider about your test results, treatment options, and if necessary, the need for more tests. Vaccines  Your health care provider may recommend certain vaccines, such as:  Influenza vaccine. This is recommended every year.  Tetanus, diphtheria, and acellular pertussis (Tdap, Td) vaccine. You may need a Td booster every 10 years.  Zoster vaccine.  You may need this after age 33.  Pneumococcal 13-valent conjugate (PCV13) vaccine. One dose is recommended after age 15.  Pneumococcal polysaccharide (PPSV23) vaccine. One dose is recommended after age 29. Talk to your health care provider about which screenings and vaccines you need and how often you need them. This information is not intended to replace advice given to you by your health care provider. Make sure you discuss any questions you have with your health care provider. Document Released: 02/22/2015 Document Revised: 10/16/2015 Document Reviewed: 11/27/2014 Elsevier Interactive Patient Education  2017 [employer] Prevention in the Home Falls can cause injuries. They can happen to people of all ages. There are many things you can do to make your home safe and to help prevent falls. What can I do on the outside of my home?  Regularly fix the edges of walkways and driveways and fix any cracks.  Remove anything that might make you trip as you walk through a door, such as a raised step or threshold.  Trim any bushes or trees on the path to your home.  Use bright outdoor lighting.  Clear any walking paths of anything that might make someone trip, such as rocks or tools.  Regularly check to see if handrails are loose or broken. Make sure that both sides of any steps have handrails.  Any raised decks and porches should have guardrails on the edges.  Have any leaves, snow, or ice cleared regularly.  Use sand or salt on walking paths during winter.  Clean up any spills in your garage right away. This includes oil or grease spills. What can I do in the bathroom?  Use night lights.  Install grab bars by the toilet and in the tub and shower. Do not use towel bars as grab bars.  Use non-skid mats or decals in the tub or shower.  If you need to sit down in the shower, use a plastic, non-slip stool.  Keep the floor dry. Clean up any water that spills on the floor as soon  as it happens.  Remove soap buildup in the tub or shower regularly.  Attach bath mats securely with double-sided non-slip rug tape.  Do not have throw rugs and other things on the floor that can make you trip. What can I do in the bedroom?  Use night lights.  Make sure that you have a light by your bed that is easy to reach.  Do not use any sheets or blankets that are too big for your bed. They should not hang down onto the floor.  Have a firm chair that has side arms. You can use this for support while you get dressed.  Do not have throw rugs and other things on the floor that can make you trip. What can I do in the kitchen?  Clean up any spills right away.  Avoid walking on wet floors.  Keep items that you use a lot in easy-to-reach places.  If you need to reach something above you, use a strong step stool that has a grab bar.  Keep electrical cords out of the way.  Do not use floor polish or wax that makes floors slippery. If you must use wax, use non-skid floor wax.  Do not have throw rugs and other things on the floor that  can make you trip. What can I do with my stairs?  Do not leave any items on the stairs.  Make sure that there are handrails on both sides of the stairs and use them. Fix handrails that are broken or loose. Make sure that handrails are as long as the stairways.  Check any carpeting to make sure that it is firmly attached to the stairs. Fix any carpet that is loose or worn.  Avoid having throw rugs at the top or bottom of the stairs. If you do have throw rugs, attach them to the floor with carpet tape.  Make sure that you have a light switch at the top of the stairs and the bottom of the stairs. If you do not have them, ask someone to add them for you. What else can I do to help prevent falls?  Wear shoes that:  Do not have high heels.  Have rubber bottoms.  Are comfortable and fit you well.  Are closed at the toe. Do not wear sandals.  If  you use a stepladder:  Make sure that it is fully opened. Do not climb a closed stepladder.  Make sure that both sides of the stepladder are locked into place.  Ask someone to hold it for you, if possible.  Clearly mark and make sure that you can see:  Any grab bars or handrails.  First and last steps.  Where the edge of each step is.  Use tools that help you move around (mobility aids) if they are needed. These include:  Canes.  Walkers.  Scooters.  Crutches.  Turn on the lights when you go into a dark area. Replace any light bulbs as soon as they burn out.  Set up your furniture so you have a clear path. Avoid moving your furniture around.  If any of your floors are uneven, fix them.  If there are any pets around you, be aware of where they are.  Review your medicines with your doctor. Some medicines can make you feel dizzy. This can increase your chance of falling. Ask your doctor what other things that you can do to help prevent falls. This information is not intended to replace advice given to you by your health care provider. Make sure you discuss any questions you have with your health care provider. Document Released: 11/22/2008 Document Revised: 07/04/2015 Document Reviewed: 03/02/2014 Elsevier Interactive Patient Education  2017 [employer].

## 2019-10-11 ENCOUNTER — Telehealth: Payer: Self-pay | Admitting: Internal Medicine

## 2019-10-11 MED ORDER — DICYCLOMINE HCL 10 MG PO CAPS
ORAL_CAPSULE | ORAL | 1 refills | Status: DC
Start: 2019-10-11 — End: 2020-01-16

## 2019-10-11 NOTE — Telephone Encounter (Signed)
Pt had Colon done 2 weeks ago. Pt states for the last 3-4 days she has been having cramping in the lower part of her abdomen along with bloating. States after she eats anything it feels like she has to run to the bathroom. She also states it feels like someone has punched her in the gut right below her breast bone. Pt is leaving to go out of town and wanted to know if Dr. Henrene Pastor could send in something to help her. Please advise.

## 2019-10-11 NOTE — Telephone Encounter (Signed)
Spoke with pt and she is aware. Script sent to pharmacy. 

## 2019-10-11 NOTE — Telephone Encounter (Signed)
Not sure why she is having these current complaints.  She was having complaints of abdominal discomfort and bloating when she was seen in the office in June.  Colonoscopy did not reveal any significant problems.  I recommend that she take probiotic align, 1 twice daily for 2 weeks.  As well prescribed Bentyl 10 mg before meals and every 6 hours as needed.  Should she happen develop severe persistent abdominal pain and/or fevers, go to the emergency room for more extensive evaluation

## 2019-10-11 NOTE — Telephone Encounter (Signed)
Patient called states she is having a lot of abdominal discomfort and bloating since she had her procedure two weeks ago.

## 2019-11-08 ENCOUNTER — Other Ambulatory Visit: Payer: Self-pay | Admitting: Family Medicine

## 2019-11-08 DIAGNOSIS — K644 Residual hemorrhoidal skin tags: Secondary | ICD-10-CM

## 2019-11-09 DIAGNOSIS — M1811 Unilateral primary osteoarthritis of first carpometacarpal joint, right hand: Secondary | ICD-10-CM | POA: Diagnosis not present

## 2019-11-09 DIAGNOSIS — M542 Cervicalgia: Secondary | ICD-10-CM | POA: Diagnosis not present

## 2019-11-09 MED ORDER — HYDROCORTISONE (PERIANAL) 2.5 % EX CREA: 1.0000 "application " | TOPICAL_CREAM | Freq: Two times a day (BID) | CUTANEOUS | 0 refills | Status: DC

## 2019-11-09 NOTE — Telephone Encounter (Signed)
Please advise 

## 2019-11-14 ENCOUNTER — Encounter: Payer: Self-pay | Admitting: Physical Therapy

## 2019-11-14 ENCOUNTER — Other Ambulatory Visit: Payer: Self-pay

## 2019-11-14 ENCOUNTER — Ambulatory Visit: Payer: Medicare PPO | Attending: Orthopedic Surgery | Admitting: Physical Therapy

## 2019-11-14 DIAGNOSIS — M542 Cervicalgia: Secondary | ICD-10-CM | POA: Diagnosis not present

## 2019-11-14 DIAGNOSIS — R252 Cramp and spasm: Secondary | ICD-10-CM | POA: Diagnosis not present

## 2019-11-14 NOTE — Therapy (Signed)
[place]. [place], [place], [address] Phone: [phone]   Fax:  [phone]  Physical Therapy Evaluation  Patient Details  Name: [name] MRN: [id number] Date of Birth: Sep 24, 1950 Referring Provider (PT): Noemi Chapel   Encounter Date: 11/14/2019   PT End of Session - 11/14/19 0949    Visit Number 1    Number of Visits 12    Date for PT Re-Evaluation 01/14/20    Authorization Type [employer]    PT Start Time 0927    PT Stop Time 1015    PT Time Calculation (min) 48 min    Activity Tolerance Patient tolerated treatment well    Behavior During Therapy [facility] for tasks assessed/performed           Past Medical History:  Diagnosis Date  . Anal fissure   . Anemia    during pregnancy  . Anxiety   . Basal cell carcinoma   . DDD (degenerative disc disease)   . Diverticulosis   . Hypokalemia   . LBP (low back pain)   . Melanoma ([place])   . Migraine   . MVP (mitral valve prolapse) 1990   psa  . Rosacea   . Ulcer    as a child    Past Surgical History:  Procedure Laterality Date  . ABDOMINAL HYSTERECTOMY  1985  . BREAST BIOPSY Left    secondary to infection  . I & D EXTREMITY Right 09/09/2012   Procedure: Minor IRRIGATION AND DEBRIDEMENT EXTREMITY paronychia of right thumb;  Surgeon: Wynonia Sours, MD;  Location: [place];  Service: Orthopedics;  Laterality: Right;  . I & D EXTREMITY Left 01/26/2014   Procedure: MINOR IINCISION AND DRAINAGE paronychia left index finger;  Surgeon: Daryll Brod, MD;  Location: [place];  Service: Orthopedics;  Laterality: Left;  left index  . OOPHORECTOMY Left 1994  . REPAIR KNEE LIGAMENT Left 1989  . TONSILLECTOMY  1955  . TUBAL LIGATION    . VEIN SURGERY      There were no vitals filed for this visit.    Subjective Assessment - 11/14/19 0928    Subjective Patient reports that she has had some neck painOctober 18th.  She reports that she  had a colonoscopy that day and had a lot of anxiety.  She reports that she has had neck and left shoulder pain since that time.    Diagnostic tests X-rays showed arthritis    Patient Stated Goals have less pain    Currently in Pain? Yes    Pain Score 8     Pain Location Neck    Pain Orientation Left    Pain Descriptors / Indicators Sharp    Pain Type Acute pain    Pain Radiating Towards pain in the left upper trap, the left scapula area nd the left lateral upper arm    Pain Onset More than a month ago    Pain Frequency Constant    Aggravating Factors  sleeping pain up to 9/10    Pain Relieving Factors two advil  at best pain a 2/10    Effect of Pain on Daily Activities reports just hurts              [facility] PT Assessment - 11/14/19 0001      Assessment   Medical Diagnosis neck pain    Referring Provider (PT) Noemi Chapel    Onset Date/Surgical Date 09/25/19    Hand  Dominance Right    Prior Therapy no      Precautions   Precautions None      Balance Screen   Has the patient fallen in the past 6 months No    Has the patient had a decrease in activity level because of a fear of falling?  No    Is the patient reluctant to leave their home because of a fear of falling?  No      Home Environment   Additional Comments does housework and some gardening      Prior Function   Level of Independence Independent    Vocation Retired    Leisure walks every other day      Posture/Postural Control   Posture Comments mild forward head, slight rounded shoulders      ROM / Strength   AROM / PROM / Strength AROM;Strength      AROM   Overall AROM Comments Cervical flexion decreased 25%, extension, side bending and rotation decreased 75% with c/o pain and tightness,  shoulder ROM WFL's       Strength   Overall Strength Comments 4+/5 wihtout increase of pain      Palpation   Palpation comment She is very tight and tender i nthe left upper trap, the left cervical area, into the left  rhomboid and left upper arm                      Objective measurements completed on examination: See above findings.       [facility] Adult PT Treatment/Exercise - 11/14/19 0001      Modalities   Modalities Electrical Stimulation;Moist Heat      Moist Heat Therapy   Number Minutes Moist Heat 12 Minutes    Moist Heat Location Cervical      Electrical Stimulation   Electrical Stimulation Location left cervical and upper trap area    Electrical Stimulation Action IFC    Electrical Stimulation Parameters supine    Electrical Stimulation Goals Pain                  PT Education - 11/14/19 0948    Education Details shoulder shrugs and retractions    Person(s) Educated Patient    Methods Explanation;Demonstration;Handout    Comprehension Verbalized understanding            PT Short Term Goals - 11/14/19 0952      PT SHORT TERM GOAL #1   Title independent with initial HEP    Time 2    Period Weeks    Status New             PT Long Term Goals - 11/14/19 3557      PT LONG TERM GOAL #1   Title decrease pain 50%    Time 8    Period Weeks    Status New      PT LONG TERM GOAL #2   Title increase cervical ROM 25%    Time 8    Period Weeks    Status New      PT LONG TERM GOAL #3   Title understand posture and body mechanics    Time 8    Period Weeks    Status New      PT LONG TERM GOAL #4   Title report no difficulty sleeping    Time 8    Period Weeks    Status New  Plan - 11/14/19 0950    Clinical Impression Statement Patient reports that since she had a colonoscopy in August she has had left neck and shoulder pain, she is unsure of why but reports that she was very weak and needed help walking up stairs and may have strained it.  X-rays of the cervical spine shows arthritis.  She has limitation in ROM, she is very tight nad tender in the mms of the cervical, upper trap, rhomboid and arm.    Stability/Clinical  Decision Making Evolving/Moderate complexity    Clinical Decision Making Low    Rehab Potential Good    PT Frequency 2x / week    PT Duration 8 weeks    PT Treatment/Interventions ADLs/Self Care Home Management;Electrical Stimulation;Moist Heat;Traction;Ultrasound;Neuromuscular re-education;Therapeutic exercise;Therapeutic activities;Patient/family education;Manual techniques    PT Next Visit Plan slowly start exercises may try cervical traction    Consulted and Agree with Plan of Care Patient           Patient will benefit from skilled therapeutic intervention in order to improve the following deficits and impairments:  Decreased range of motion, Impaired UE functional use, Increased muscle spasms, Pain, Improper body mechanics, Postural dysfunction, Decreased strength  Visit Diagnosis: Cervicalgia - Plan: PT plan of care cert/re-cert  Cramp and spasm - Plan: PT plan of care cert/re-cert     Problem List Patient Active Problem List   Diagnosis Date Noted  . Chronic pain of right thumb 03/09/2019  . External hemorrhoids 03/03/2018  . Other chest pain 02/23/2018  . Insomnia 02/24/2017  . Healthcare maintenance 02/24/2017  . Conjunctivitis 03/01/2014  . Seborrheic keratosis, inflamed 01/05/2012  . Postmenopausal disorder 12/22/2011  . Skin cancer 07/14/2011  . Migraine 05/14/2007  . IRRITABLE BOWEL SYNDROME 05/14/2007  . Allergic rhinitis 04/07/2007  . DERMATITIS DUE TO SOLAR RADIATION 04/07/2007  . Premature beat 09/20/2006  . DIVERTICULOSIS, COLON 04/13/2002    Sumner Boast., PT 11/14/2019, 9:57 AM  [place]. [place], [place], [address] Phone: [phone]   Fax:  [phone]  Name: [name] MRN: [id number] Date of Birth: [birth year]

## 2019-11-23 ENCOUNTER — Encounter: Payer: Self-pay | Admitting: Physical Therapy

## 2019-11-23 ENCOUNTER — Ambulatory Visit: Payer: Medicare PPO | Admitting: Physical Therapy

## 2019-11-23 ENCOUNTER — Other Ambulatory Visit: Payer: Self-pay

## 2019-11-23 DIAGNOSIS — M542 Cervicalgia: Secondary | ICD-10-CM | POA: Diagnosis not present

## 2019-11-23 DIAGNOSIS — R252 Cramp and spasm: Secondary | ICD-10-CM

## 2019-11-23 NOTE — Therapy (Signed)
[place]. [place], [place], [address] Phone: [phone]   Fax:  [phone]  Physical Therapy Treatment  Patient Details  Name: [name] MRN: [id number] Date of Birth: 12/18/1950 Referring Provider (PT): Noemi Chapel   Encounter Date: 11/23/2019   PT End of Session - 11/23/19 0918    Visit Number 2    Number of Visits 12    Date for PT Re-Evaluation 01/14/20    Authorization Type [employer]    PT Start Time [phone]    PT Stop Time 0933    PT Time Calculation (min) 50 min    Activity Tolerance Patient tolerated treatment well    Behavior During Therapy [facility] for tasks assessed/performed           Past Medical History:  Diagnosis Date  . Anal fissure   . Anemia    during pregnancy  . Anxiety   . Basal cell carcinoma   . DDD (degenerative disc disease)   . Diverticulosis   . Hypokalemia   . LBP (low back pain)   . Melanoma ([place])   . Migraine   . MVP (mitral valve prolapse) 1990   psa  . Rosacea   . Ulcer    as a child    Past Surgical History:  Procedure Laterality Date  . ABDOMINAL HYSTERECTOMY  1985  . BREAST BIOPSY Left    secondary to infection  . I & D EXTREMITY Right 09/09/2012   Procedure: Minor IRRIGATION AND DEBRIDEMENT EXTREMITY paronychia of right thumb;  Surgeon: Wynonia Sours, MD;  Location: [place];  Service: Orthopedics;  Laterality: Right;  . I & D EXTREMITY Left 01/26/2014   Procedure: MINOR IINCISION AND DRAINAGE paronychia left index finger;  Surgeon: Daryll Brod, MD;  Location: [place];  Service: Orthopedics;  Laterality: Left;  left index  . OOPHORECTOMY Left 1994  . REPAIR KNEE LIGAMENT Left 1989  . TONSILLECTOMY  1955  . TUBAL LIGATION    . VEIN SURGERY      There were no vitals filed for this visit.   Subjective Assessment - 11/23/19 0845    Subjective I feel a little better    Currently in Pain? Yes    Pain Score 4     Pain Location  Neck    Pain Orientation Left    Pain Relieving Factors that treatment helped                             [facility] Adult PT Treatment/Exercise - 11/23/19 0001      Exercises   Exercises Neck      Neck Exercises: Machines for Strengthening   Cybex Row 10# 2x10    Lat Pull 15# 2x10    Other Machines for Strengthening 5# 2x10    Other Machines for Strengthening 15# 2x10      Neck Exercises: Theraband   Shoulder External Rotation Red;20 reps      Neck Exercises: Standing   Other Standing Exercises back to wall weighted ball overhead reach, W backs      Neck Exercises: Seated   Other Seated Exercise 2# bent over row, 1# bent over extesion and then no weight reverse flies all 2x10      Modalities   Modalities Electrical Stimulation;Moist Heat      Moist Heat Therapy   Number Minutes Moist Heat 12 Minutes    Moist  Heat Location Cervical      Electrical Stimulation   Electrical Stimulation Location left cervical and upper trap area    Electrical Stimulation Action IFC    Electrical Stimulation Parameters sitting    Electrical Stimulation Goals Pain                    PT Short Term Goals - 11/23/19 0922      PT SHORT TERM GOAL #1   Status Partially Met             PT Long Term Goals - 11/14/19 0952      PT LONG TERM GOAL #1   Title decrease pain 50%    Time 8    Period Weeks    Status New      PT LONG TERM GOAL #2   Title increase cervical ROM 25%    Time 8    Period Weeks    Status New      PT LONG TERM GOAL #3   Title understand posture and body mechanics    Time 8    Period Weeks    Status New      PT LONG TERM GOAL #4   Title report no difficulty sleeping    Time 8    Period Weeks    Status New                 Plan - 11/23/19 0918    Clinical Impression Statement Patient reports that she is feeling better and moving better, still with pain, able to tolerate all exercises without much issues, some popping in the  shoulder and neck with overhead activities, with cues to set scapulae this decreased, did c/o right thumb pain due to arthritis    PT Next Visit Plan continue on exercises, may try STM and manual cervical distraction    Consulted and Agree with Plan of Care Patient           Patient will benefit from skilled therapeutic intervention in order to improve the following deficits and impairments:  Decreased range of motion, Impaired UE functional use, Increased muscle spasms, Pain, Improper body mechanics, Postural dysfunction, Decreased strength  Visit Diagnosis: Cervicalgia  Cramp and spasm     Problem List Patient Active Problem List   Diagnosis Date Noted  . Chronic pain of right thumb 03/09/2019  . External hemorrhoids 03/03/2018  . Other chest pain 02/23/2018  . Insomnia 02/24/2017  . Healthcare maintenance 02/24/2017  . Conjunctivitis 03/01/2014  . Seborrheic keratosis, inflamed 01/05/2012  . Postmenopausal disorder 12/22/2011  . Skin cancer 07/14/2011  . Migraine 05/14/2007  . IRRITABLE BOWEL SYNDROME 05/14/2007  . Allergic rhinitis 04/07/2007  . DERMATITIS DUE TO SOLAR RADIATION 04/07/2007  . Premature beat 09/20/2006  . DIVERTICULOSIS, COLON 04/13/2002    Sumner Boast., PT 11/23/2019, 9:23 AM  [place]. [place], [place], [address] Phone: [phone]   Fax:  [phone]  Name: [name] MRN: [id number] Date of Birth: 1950/11/15

## 2019-11-24 ENCOUNTER — Encounter: Payer: Self-pay | Admitting: Family Medicine

## 2019-11-24 ENCOUNTER — Ambulatory Visit: Payer: Medicare PPO | Admitting: Family Medicine

## 2019-11-24 VITALS — BP 120/64 | HR 72 | Temp 98.2°F | Ht 61.5 in | Wt 116.0 lb

## 2019-11-24 DIAGNOSIS — Z0000 Encounter for general adult medical examination without abnormal findings: Secondary | ICD-10-CM

## 2019-11-24 DIAGNOSIS — L089 Local infection of the skin and subcutaneous tissue, unspecified: Secondary | ICD-10-CM | POA: Diagnosis not present

## 2019-11-24 DIAGNOSIS — W57XXXA Bitten or stung by nonvenomous insect and other nonvenomous arthropods, initial encounter: Secondary | ICD-10-CM | POA: Diagnosis not present

## 2019-11-24 DIAGNOSIS — I4949 Other premature depolarization: Secondary | ICD-10-CM

## 2019-11-24 DIAGNOSIS — R002 Palpitations: Secondary | ICD-10-CM | POA: Diagnosis not present

## 2019-11-24 DIAGNOSIS — S90862A Insect bite (nonvenomous), left foot, initial encounter: Secondary | ICD-10-CM

## 2019-11-24 MED ORDER — DOXYCYCLINE HYCLATE 100 MG PO TABS: 100.0000 mg | ORAL_TABLET | Freq: Two times a day (BID) | ORAL | 0 refills | Status: DC

## 2019-11-24 MED ORDER — ATENOLOL 25 MG PO TABS: ORAL_TABLET | ORAL | 4 refills | Status: DC

## 2019-11-24 NOTE — Progress Notes (Signed)
Established Patient Office Visit  Subjective:  Patient ID: [name], female    DOB: February 14, 1950  Age: 69 y.o. MRN: [id number]  CC:  Chief Complaint  Patient presents with  . Insect Bite    pt stated-- Lt lateral of the foot--redness, itching-- 4 days ago. Pt taking antibiotic for something else.   . Medication Refill    pt requesting for Rx Atenolol    HPI ALETHEA Rivera presents for follow-up of an insect bite to her lateral left foot last week. It has since developed swelling in her posterior.  Patient is on a maintenance dose of doxycycline for rosacea.  Few days ago she started taking a full pill once daily.  It seems to have helped some.  She requests a refill of Tenormin that she uses for palpitations as needed.  He is current from time to time is when she is under stress.  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  Of course this is stressful for her.  Tenormin works well for her.  She denies lightheadedness or dizziness after taking the medication.  Past Medical History:  Diagnosis Date  . Anal fissure   . Anemia    during pregnancy  . Anxiety   . Basal cell carcinoma   . DDD (degenerative disc disease)   . Diverticulosis   . Hypokalemia   . LBP (low back pain)   . Melanoma ([place])   . Migraine   . MVP (mitral valve prolapse) 1990   psa  . Rosacea   . Ulcer    as a child    Past Surgical History:  Procedure Laterality Date  . ABDOMINAL HYSTERECTOMY  1985  . BREAST BIOPSY Left    secondary to infection  . I & D EXTREMITY Right 09/09/2012   Procedure: Minor IRRIGATION AND DEBRIDEMENT EXTREMITY paronychia of right thumb;  Surgeon: Wynonia Sours, MD;  Location: [place];  Service: Orthopedics;  Laterality: Right;  . I & D EXTREMITY Left 01/26/2014   Procedure: MINOR IINCISION AND DRAINAGE paronychia left index finger;  Surgeon: Daryll Brod, MD;  Location: [place];  Service: Orthopedics;  Laterality: Left;   left index  . OOPHORECTOMY Left 1994  . REPAIR KNEE LIGAMENT Left 1989  . TONSILLECTOMY  1955  . TUBAL LIGATION    . VEIN SURGERY      Family History  Problem Relation Age of Onset  . Heart disease Mother   . Colon polyps Mother   . Heart disease Father   . Colitis Father   . Colon polyps Father   . Esophageal cancer Maternal Aunt   . Colon cancer Neg Hx     Social History   Socioeconomic History  . Marital status: Married    Spouse name: Not on file  . Number of children: 2  . Years of education: Not on file  . Highest education level: Not on file  Occupational History  . Occupation: retired Conservation officer, historic buildings  Tobacco Use  . Smoking status: Never Smoker  . Smokeless tobacco: Never Used  Vaping Use  . Vaping Use: Never used  Substance and Sexual Activity  . Alcohol use: Yes    Alcohol/week: 1.0 standard drink    Types: 1 Standard drinks or equivalent per week    Comment: social wine  . Drug use: No  . Sexual activity: Yes  Other Topics Concern  . Not on file  Social History Narrative  .  Not on file   Social Determinants of Health   Financial Resource Strain: Low Risk   . Difficulty of Paying Living Expenses: Not hard at all  Food Insecurity: No Food Insecurity  . Worried About Charity fundraiser in the Last Year: Never true  . Ran Out of Food in the Last Year: Never true  Transportation Needs: No Transportation Needs  . Lack of Transportation (Medical): No  . Lack of Transportation (Non-Medical): No  Physical Activity: Sufficiently Active  . Days of Exercise per Week: 4 days  . Minutes of Exercise per Session: 40 min  Stress: No Stress Concern Present  . Feeling of Stress : Only a little  Social Connections: Socially Integrated  . Frequency of Communication with Friends and Family: More than three times a week  . Frequency of Social Gatherings with Friends and Family: Once a week  . Attends Religious Services: More than 4 times per year  . Active Member  of Clubs or Organizations: Yes  . Attends Archivist Meetings: More than 4 times per year  . Marital Status: Married  Human resources officer Violence: Not At Risk  . Fear of Current or Ex-Partner: No  . Emotionally Abused: No  . Physically Abused: No  . Sexually Abused: No    Outpatient Medications Prior to Visit  Medication Sig Dispense Refill  . aspirin 81 MG tablet Take 81 mg by mouth. ON OCCASION    . calcium-vitamin D 250-100 MG-UNIT per tablet Take 1 tablet by mouth daily.      Marland Kitchen docusate sodium (COLACE) 100 MG capsule Take 100 mg by mouth daily. Daily at bedtime     . doxycycline (VIBRA-TABS) 100 MG tablet TAKE ONE TABLET DAILY AS DIRECTED    . estradiol (ESTRACE) 0.5 MG tablet Take 0.5 mg by mouth daily.    . hydrocortisone (ANUSOL-HC) 2.5 % rectal cream Place 1 application rectally 2 (two) times daily. 30 g 0  . meloxicam (MOBIC) 15 MG tablet     . NON FORMULARY Hydro eye    . vitamin E 100 UNIT capsule Take 100 Units by mouth daily.      Marland Kitchen zolmitriptan (ZOMIG ZMT) 2.5 MG disintegrating tablet Take 1 tablet (2.5 mg total) by mouth as needed for migraine. 10 tablet 4  . dicyclomine (BENTYL) 10 MG capsule Take 1 pill before each meal and every 6 hours as needed (Patient not taking: Reported on 11/24/2019) 90 capsule 1  . lidocaine-prilocaine (EMLA) cream Apply 1 application topically as needed. (Patient not taking: Reported on 10/05/2019) 30 g 0  . omeprazole (PRILOSEC) 20 MG capsule Take 1 capsule (20 mg total) by mouth daily. AS NEEDED (Patient not taking: Reported on 10/05/2019) 100 capsule 4  . atenolol (TENORMIN) 25 MG tablet One half tablet when necessary for palpitations (Patient not taking: Reported on 09/27/2019) 30 tablet 4   No facility-administered medications prior to visit.    Allergies  Allergen Reactions  . Codeine   . Compazine   . Gabapentin   . Prednisone     REACTION: makes heart beat hard \\T \ fast  . Prochlorperazine Edisylate   . Sulfa Antibiotics     . Sulfonamide Derivatives     ROS Review of Systems  Constitutional: Negative.   HENT: Negative.   Eyes: Negative for photophobia and visual disturbance.  Respiratory: Negative for shortness of breath and wheezing.   Cardiovascular: Positive for palpitations. Negative for chest pain.  Gastrointestinal: Negative.   Genitourinary: Negative.  Allergic/Immunologic: Negative for immunocompromised state.  Neurological: Negative for dizziness and light-headedness.  Psychiatric/Behavioral: Negative.       Objective:    Physical Exam Vitals and nursing note reviewed.  Constitutional:      General: She is not in acute distress.    Appearance: Normal appearance. She is not ill-appearing, toxic-appearing or diaphoretic.  HENT:     Head: Normocephalic and atraumatic.     Right Ear: External ear normal.     Left Ear: External ear normal.     Mouth/Throat:     Mouth: Mucous membranes are dry.     Pharynx: Oropharynx is clear. No oropharyngeal exudate or posterior oropharyngeal erythema.  Eyes:     General: No scleral icterus.       Right eye: No discharge.        Left eye: No discharge.     Extraocular Movements: Extraocular movements intact.     Conjunctiva/sclera: Conjunctivae normal.     Pupils: Pupils are equal, round, and reactive to light.  Cardiovascular:     Rate and Rhythm: Normal rate and regular rhythm.     Pulses:          Dorsalis pedis pulses are 2+ on the left side.       Posterior tibial pulses are 1+ on the left side.  Pulmonary:     Effort: Pulmonary effort is normal.     Breath sounds: Normal breath sounds.  Musculoskeletal:     Cervical back: No rigidity or tenderness.  Lymphadenopathy:     Cervical: No cervical adenopathy.  Skin:    General: Skin is warm and dry.       Neurological:     Mental Status: She is alert and oriented to person, place, and time.  Psychiatric:        Mood and Affect: Mood normal.        Behavior: Behavior normal.     BP  120/64   Pulse 72   Temp 98.2 F (36.8 C)   Ht 5' 1.5" (1.562 m)   Wt 116 lb (52.6 kg)   SpO2 96%   BMI 21.56 kg/m  Wt Readings from Last 3 Encounters:  11/24/19 116 lb (52.6 kg)  10/05/19 112 lb (50.8 kg)  09/27/19 115 lb (52.2 kg)     Health Maintenance Due  Topic Date Due  . INFLUENZA VACCINE  09/10/2019    There are no preventive care reminders to display for this patient.  Lab Results  Component Value Date   TSH 1.50 02/24/2017   Lab Results  Component Value Date   WBC 7.7 07/20/2019   HGB 13.3 07/20/2019   HCT 40.0 07/20/2019   MCV 95.9 07/20/2019   PLT 282.0 07/20/2019   Lab Results  Component Value Date   NA 138 07/20/2019   K 4.2 07/20/2019   CO2 34 (H) 07/20/2019   GLUCOSE 104 (H) 07/20/2019   BUN 16 07/20/2019   CREATININE 0.68 07/20/2019   BILITOT 0.4 07/20/2019   ALKPHOS 65 07/20/2019   AST 19 07/20/2019   ALT 16 07/20/2019   PROT 7.9 07/20/2019   ALBUMIN 4.7 07/20/2019   CALCIUM 10.7 (H) 07/20/2019   ANIONGAP 8 02/22/2018   GFR 85.33 07/20/2019   Lab Results  Component Value Date   CHOL 178 03/09/2019   Lab Results  Component Value Date   HDL 68.10 03/09/2019   Lab Results  Component Value Date   LDLCALC 90 03/09/2019   Lab Results  Component Value Date   TRIG 99.0 03/09/2019   Lab Results  Component Value Date   CHOLHDL 3 03/09/2019   No results found for: HGBA1C    Assessment & Plan:   Problem List Items Addressed This Visit      Cardiovascular and Mediastinum   Premature beat   Relevant Medications   atenolol (TENORMIN) 25 MG tablet     Musculoskeletal and Integument   Insect bite of left foot - Primary   Relevant Medications   doxycycline (VIBRA-TABS) 100 MG tablet   Pustule   Relevant Medications   doxycycline (VIBRA-TABS) 100 MG tablet     Other   Healthcare maintenance   Relevant Orders   DG DXA BODY COMPOSITION    Other Visit Diagnoses    Palpitations       Relevant Medications   atenolol  (TENORMIN) 25 MG tablet      Meds ordered this encounter  Medications  . doxycycline (VIBRA-TABS) 100 MG tablet    Sig: Take 1 tablet (100 mg total) by mouth 2 (two) times daily.    Dispense:  20 tablet    Refill:  0  . atenolol (TENORMIN) 25 MG tablet    Sig: One half tablet when necessary for palpitations    Dispense:  30 tablet    Refill:  4    Follow-up: Return for scheduled physical..  We discussed using propranolol as a possible alternative to the longer acting Tenormin.  Sun exposure warnings were given regarding doxycycline.  She will follow-up as scheduled for physical.  Or sooner if pustule does not resolve with the Doxy treatment.  Libby Maw, MD

## 2019-11-27 ENCOUNTER — Encounter: Payer: Self-pay | Admitting: Physical Therapy

## 2019-11-27 ENCOUNTER — Other Ambulatory Visit: Payer: Self-pay

## 2019-11-27 ENCOUNTER — Ambulatory Visit: Payer: Medicare PPO | Admitting: Physical Therapy

## 2019-11-27 DIAGNOSIS — R252 Cramp and spasm: Secondary | ICD-10-CM

## 2019-11-27 DIAGNOSIS — M542 Cervicalgia: Secondary | ICD-10-CM | POA: Diagnosis not present

## 2019-11-27 NOTE — Therapy (Signed)
[facility]. [place], [place], [address] Phone: [phone]   Fax:  [phone]  Physical Therapy Treatment  Patient Details  Name: [name] MRN: [id number] Date of Birth: 10/07/1950 Referring Provider (PT): Noemi Chapel   Encounter Date: 11/27/2019   PT End of Session - 11/27/19 1055    Visit Number 3    Number of Visits 12    Date for PT Re-Evaluation 01/14/20    Authorization Type [employer]    PT Start Time 1013    PT Stop Time 1110    PT Time Calculation (min) 57 min    Activity Tolerance Patient tolerated treatment well    Behavior During Therapy [facility] for tasks assessed/performed           Past Medical History:  Diagnosis Date  . Anal fissure   . Anemia    during pregnancy  . Anxiety   . Basal cell carcinoma   . DDD (degenerative disc disease)   . Diverticulosis   . Hypokalemia   . LBP (low back pain)   . Melanoma ([place])   . Migraine   . MVP (mitral valve prolapse) 1990   psa  . Rosacea   . Ulcer    as a child    Past Surgical History:  Procedure Laterality Date  . ABDOMINAL HYSTERECTOMY  1985  . BREAST BIOPSY Left    secondary to infection  . I & D EXTREMITY Right 09/09/2012   Procedure: Minor IRRIGATION AND DEBRIDEMENT EXTREMITY paronychia of right thumb;  Surgeon: Wynonia Sours, MD;  Location: [place];  Service: Orthopedics;  Laterality: Right;  . I & D EXTREMITY Left 01/26/2014   Procedure: MINOR IINCISION AND DRAINAGE paronychia left index finger;  Surgeon: Daryll Brod, MD;  Location: [place];  Service: Orthopedics;  Laterality: Left;  left index  . OOPHORECTOMY Left 1994  . REPAIR KNEE LIGAMENT Left 1989  . TONSILLECTOMY  1955  . TUBAL LIGATION    . VEIN SURGERY      There were no vitals filed for this visit.   Subjective Assessment - 11/27/19 1019    Subjective I am doing well with the neck, she c/o right thumb pain, reports that she has arthritis  and has trouble gripping and lifting    Currently in Pain? Yes    Pain Score 2     Pain Location Neck    Pain Orientation Left    Pain Descriptors / Indicators Aching;Spasm    Pain Relieving Factors that treatment helped                             [facility] Adult PT Treatment/Exercise - 11/27/19 0001      Neck Exercises: Machines for Strengthening   Cybex Row 10# 2x10    Lat Pull 15# 2x10    Other Machines for Strengthening 5# straight arm pulls cues for posture and core activation    Other Machines for Strengthening 5# biceps, 15# triceps 2x10 each      Neck Exercises: Theraband   Shoulder External Rotation Red;20 reps      Neck Exercises: Standing   Other Standing Exercises back to wall weighted ball overhead reach, W backs      Neck Exercises: Seated   Other Seated Exercise 2# bent over row, 1# bent over extesion and then no weight reverse flies all 2x10  Neck Exercises: Supine   Neck Retraction 1 rep;3 secs    Neck Retraction Limitations we had to stop this because she immediately c/o headach int he frontal area      Moist Heat Therapy   Number Minutes Moist Heat 12 Minutes    Moist Heat Location Cervical      Electrical Stimulation   Electrical Stimulation Location cervical trap area    Electrical Stimulation Action IFC    Electrical Stimulation Parameters sitting    Electrical Stimulation Goals Pain      Manual Therapy   Manual Therapy Soft tissue mobilization;Manual Traction    Soft tissue mobilization to the right upper trap, has a large and very tender trigger point    Manual Traction gentle occipital release                    PT Short Term Goals - 11/23/19 0922      PT SHORT TERM GOAL #1   Status Partially Met             PT Long Term Goals - 11/27/19 1056      PT LONG TERM GOAL #1   Title decrease pain 50%    Status On-going                 Plan - 11/27/19 1055    Clinical Impression Statement Patient  doing well until we tried cervical retraction, this immediately caused a "migraine" in the frontal area, she reports that she has one a week, but this was odd that retraction immediately caused this.  She does have a significant trigger point in the right upper trap    PT Next Visit Plan continue on exercises, may try STM and manual cervical distraction    Consulted and Agree with Plan of Care Patient           Patient will benefit from skilled therapeutic intervention in order to improve the following deficits and impairments:  Decreased range of motion, Impaired UE functional use, Increased muscle spasms, Pain, Improper body mechanics, Postural dysfunction, Decreased strength  Visit Diagnosis: Cervicalgia  Cramp and spasm     Problem List Patient Active Problem List   Diagnosis Date Noted  . Insect bite of left foot 11/24/2019  . Pustule 11/24/2019  . Chronic pain of right thumb 03/09/2019  . External hemorrhoids 03/03/2018  . Other chest pain 02/23/2018  . Insomnia 02/24/2017  . Healthcare maintenance 02/24/2017  . Conjunctivitis 03/01/2014  . Seborrheic keratosis, inflamed 01/05/2012  . Postmenopausal disorder 12/22/2011  . Skin cancer 07/14/2011  . Migraine 05/14/2007  . IRRITABLE BOWEL SYNDROME 05/14/2007  . Allergic rhinitis 04/07/2007  . DERMATITIS DUE TO SOLAR RADIATION 04/07/2007  . Premature beat 09/20/2006  . DIVERTICULOSIS, COLON 04/13/2002    Sumner Boast., PT 11/27/2019, 10:57 AM  [facility] [place]. [place], [place], [address] Phone: [phone]   Fax:  [phone]  Name: [name] MRN: [id number] Date of Birth: 1950-12-09

## 2019-12-04 ENCOUNTER — Encounter: Payer: Self-pay | Admitting: Family Medicine

## 2019-12-05 ENCOUNTER — Telehealth: Payer: Self-pay

## 2019-12-05 NOTE — Telephone Encounter (Signed)
Patient would like for [facility] to call her to discuss laser and face peels.

## 2019-12-06 ENCOUNTER — Ambulatory Visit: Payer: Medicare PPO | Admitting: Physical Therapy

## 2019-12-06 ENCOUNTER — Other Ambulatory Visit: Payer: Self-pay

## 2019-12-06 ENCOUNTER — Encounter: Payer: Self-pay | Admitting: Physical Therapy

## 2019-12-06 DIAGNOSIS — M542 Cervicalgia: Secondary | ICD-10-CM | POA: Diagnosis not present

## 2019-12-06 DIAGNOSIS — R252 Cramp and spasm: Secondary | ICD-10-CM

## 2019-12-06 NOTE — Therapy (Signed)
[place]. [place], [place], [address] Phone: [phone]   Fax:  [phone]  Physical Therapy Treatment  Patient Details  Name: [name] MRN: [id number] Date of Birth: August 27, 1950 Referring Provider (PT): Noemi Chapel   Encounter Date: 12/06/2019   PT End of Session - 12/06/19 1134    Visit Number 4    Date for PT Re-Evaluation 01/14/20    Authorization Type [employer]    PT Start Time 1015    PT Stop Time 1110    PT Time Calculation (min) 55 min    Activity Tolerance Patient tolerated treatment well    Behavior During Therapy [facility] for tasks assessed/performed           Past Medical History:  Diagnosis Date  . Anal fissure   . Anemia    during pregnancy  . Anxiety   . Basal cell carcinoma   . DDD (degenerative disc disease)   . Diverticulosis   . Hypokalemia   . LBP (low back pain)   . Melanoma ([place])   . Migraine   . MVP (mitral valve prolapse) 1990   psa  . Rosacea   . Ulcer    as a child    Past Surgical History:  Procedure Laterality Date  . ABDOMINAL HYSTERECTOMY  1985  . BREAST BIOPSY Left    secondary to infection  . I & D EXTREMITY Right 09/09/2012   Procedure: Minor IRRIGATION AND DEBRIDEMENT EXTREMITY paronychia of right thumb;  Surgeon: Wynonia Sours, MD;  Location: [facility];  Service: Orthopedics;  Laterality: Right;  . I & D EXTREMITY Left 01/26/2014   Procedure: MINOR IINCISION AND DRAINAGE paronychia left index finger;  Surgeon: Daryll Brod, MD;  Location: [place];  Service: Orthopedics;  Laterality: Left;  left index  . OOPHORECTOMY Left 1994  . REPAIR KNEE LIGAMENT Left 1989  . TONSILLECTOMY  1955  . TUBAL LIGATION    . VEIN SURGERY      There were no vitals filed for this visit.   Subjective Assessment - 12/06/19 1017    Subjective Patient reports that the HA caused by the cervical retraction went away after an hour.  She has left upper trap  tightness and pain    Currently in Pain? Yes    Pain Score 4     Pain Location Neck    Pain Orientation Left    Aggravating Factors  reaching                             [facility] Adult PT Treatment/Exercise - 12/06/19 0001      Neck Exercises: Machines for Strengthening   Nustep level 4 x 5 minutes    Cybex Row 15# 2x10    Other Machines for Strengthening 5# straight arm pulls cues for posture and core activation    Other Machines for Strengthening 5# biceps, 15# triceps 3x10 each      Neck Exercises: Theraband   Shoulder External Rotation Red;20 reps      Neck Exercises: Standing   Other Standing Exercises W backs    Other Standing Exercises back to wall weighted ball overhead reach, W backs      Modalities   Modalities Electrical Stimulation;Moist Heat      Moist Heat Therapy   Number Minutes Moist Heat 12 Minutes    Moist Heat Location Cervical  Theme park manager cervical trap area    Chartered certified accountant IFC    Electrical Stimulation Parameters sitting    Electrical Stimulation Goals Pain      Manual Therapy   Manual Therapy Soft tissue mobilization;Manual Traction    Soft tissue mobilization to bilateral upper traps and into the rhomboids                    PT Short Term Goals - 11/23/19 0922      PT SHORT TERM GOAL #1   Status Partially Met             PT Long Term Goals - 12/06/19 1135      PT LONG TERM GOAL #1   Title decrease pain 50%    Status Partially Met      PT LONG TERM GOAL #2   Title increase cervical ROM 25%    Status Partially Met      PT LONG TERM GOAL #3   Title understand posture and body mechanics    Status Partially Met      PT LONG TERM GOAL #4   Title report no difficulty sleeping    Status Partially Met                 Plan - 12/06/19 1134    Clinical Impression Statement Patient reports that the HA from the cervial retraction went  away, she reports that it lasted an hour or two, she reports that she is feeling stroner but that when she tries the exercises she sees that she is weak and shaky.  Still limitations in the cervical ROM    PT Next Visit Plan continue on exercises, may try STM and manual cervical distraction    Consulted and Agree with Plan of Care Patient           Patient will benefit from skilled therapeutic intervention in order to improve the following deficits and impairments:  Decreased range of motion, Impaired UE functional use, Increased muscle spasms, Pain, Improper body mechanics, Postural dysfunction, Decreased strength  Visit Diagnosis: Cervicalgia  Cramp and spasm     Problem List Patient Active Problem List   Diagnosis Date Noted  . Insect bite of left foot 11/24/2019  . Pustule 11/24/2019  . Chronic pain of right thumb 03/09/2019  . External hemorrhoids 03/03/2018  . Other chest pain 02/23/2018  . Insomnia 02/24/2017  . Healthcare maintenance 02/24/2017  . Conjunctivitis 03/01/2014  . Seborrheic keratosis, inflamed 01/05/2012  . Postmenopausal disorder 12/22/2011  . Skin cancer 07/14/2011  . Migraine 05/14/2007  . IRRITABLE BOWEL SYNDROME 05/14/2007  . Allergic rhinitis 04/07/2007  . DERMATITIS DUE TO SOLAR RADIATION 04/07/2007  . Premature beat 09/20/2006  . DIVERTICULOSIS, COLON 04/13/2002    Sumner Boast., PT 12/06/2019, 11:36 AM  [place]. [place], [place], [address] Phone: [phone]   Fax:  [phone]  Name: [name] MRN: [id number] Date of Birth: October 03, 1950

## 2019-12-11 ENCOUNTER — Other Ambulatory Visit: Payer: Self-pay

## 2019-12-11 ENCOUNTER — Ambulatory Visit: Payer: Medicare PPO | Attending: Orthopedic Surgery | Admitting: Physical Therapy

## 2019-12-11 ENCOUNTER — Encounter: Payer: Self-pay | Admitting: Physical Therapy

## 2019-12-11 DIAGNOSIS — R252 Cramp and spasm: Secondary | ICD-10-CM | POA: Diagnosis not present

## 2019-12-11 DIAGNOSIS — M542 Cervicalgia: Secondary | ICD-10-CM | POA: Diagnosis not present

## 2019-12-11 NOTE — Therapy (Signed)
[facility]. [place], [place], [address] Phone: [phone]   Fax:  [phone]  Physical Therapy Treatment  Patient Details  Name: [name] MRN: [id number] Date of Birth: December 03, 1950 Referring Provider (PT): Noemi Chapel   Encounter Date: 12/11/2019   PT End of Session - 12/11/19 1048    Visit Number 5    Number of Visits 12    Date for PT Re-Evaluation 01/14/20    Authorization Type [employer]    PT Start Time 1011    PT Stop Time 1100    PT Time Calculation (min) 49 min    Activity Tolerance Patient tolerated treatment well    Behavior During Therapy [facility] for tasks assessed/performed           Past Medical History:  Diagnosis Date  . Anal fissure   . Anemia    during pregnancy  . Anxiety   . Basal cell carcinoma   . DDD (degenerative disc disease)   . Diverticulosis   . Hypokalemia   . LBP (low back pain)   . Melanoma ([place])   . Migraine   . MVP (mitral valve prolapse) 1990   psa  . Rosacea   . Ulcer    as a child    Past Surgical History:  Procedure Laterality Date  . ABDOMINAL HYSTERECTOMY  1985  . BREAST BIOPSY Left    secondary to infection  . I & D EXTREMITY Right 09/09/2012   Procedure: Minor IRRIGATION AND DEBRIDEMENT EXTREMITY paronychia of right thumb;  Surgeon: Wynonia Sours, MD;  Location: [place];  Service: Orthopedics;  Laterality: Right;  . I & D EXTREMITY Left 01/26/2014   Procedure: MINOR IINCISION AND DRAINAGE paronychia left index finger;  Surgeon: Daryll Brod, MD;  Location: [facility];  Service: Orthopedics;  Laterality: Left;  left index  . OOPHORECTOMY Left 1994  . REPAIR KNEE LIGAMENT Left 1989  . TONSILLECTOMY  1955  . TUBAL LIGATION    . VEIN SURGERY      There were no vitals filed for this visit.   Subjective Assessment - 12/11/19 1018    Subjective Pateint reports that she has been doing well, until last night startedwith right upper  trap pain and spasms    Currently in Pain? Yes    Pain Score 3     Pain Location Neck    Pain Orientation Right    Aggravating Factors  unsure ofwhy the pain increased last night                             [facility] Adult PT Treatment/Exercise - 12/11/19 0001      Neck Exercises: Machines for Strengthening   Nustep level 5 x 6 minutes    Other Machines for Strengthening 5# straight arm pulls cues for posture and core activation    Other Machines for Strengthening 5# biceps, 15# triceps 3x10 each      Neck Exercises: Theraband   Shoulder External Rotation Red;20 reps    Horizontal ABduction 20 reps;Red      Neck Exercises: Standing   Other Standing Exercises W backs    Other Standing Exercises I, Y, T 1# 2x10      Neck Exercises: Seated   Other Seated Exercise 2# bent over row, 1# bent over extesion and then no weight reverse flies all 2x10    Other Seated Exercise isometric  abs      Manual Therapy   Manual Therapy Soft tissue mobilization;Manual Traction    Soft tissue mobilization to bilateral upper traps and into the rhomboids                    PT Short Term Goals - 11/23/19 0922      PT SHORT TERM GOAL #1   Status Partially Met             PT Long Term Goals - 12/06/19 1135      PT LONG TERM GOAL #1   Title decrease pain 50%    Status Partially Met      PT LONG TERM GOAL #2   Title increase cervical ROM 25%    Status Partially Met      PT LONG TERM GOAL #3   Title understand posture and body mechanics    Status Partially Met      PT LONG TERM GOAL #4   Title report no difficulty sleeping    Status Partially Met                 Plan - 12/11/19 1049    Clinical Impression Statement Patient is reporting that she is feeling better overall, she reports that she started having soem right upper trap pain last night.  She does report doing a lot of cooking.  She is getting stronger demonstrated by less shaking    PT Next  Visit Plan continue to work on scapular and core strengthening    Consulted and Agree with Plan of Care Patient           Patient will benefit from skilled therapeutic intervention in order to improve the following deficits and impairments:  Decreased range of motion, Impaired UE functional use, Increased muscle spasms, Pain, Improper body mechanics, Postural dysfunction, Decreased strength  Visit Diagnosis: Cervicalgia  Cramp and spasm     Problem List Patient Active Problem List   Diagnosis Date Noted  . Insect bite of left foot 11/24/2019  . Pustule 11/24/2019  . Chronic pain of right thumb 03/09/2019  . External hemorrhoids 03/03/2018  . Other chest pain 02/23/2018  . Insomnia 02/24/2017  . Healthcare maintenance 02/24/2017  . Conjunctivitis 03/01/2014  . Seborrheic keratosis, inflamed 01/05/2012  . Postmenopausal disorder 12/22/2011  . Skin cancer 07/14/2011  . Migraine 05/14/2007  . IRRITABLE BOWEL SYNDROME 05/14/2007  . Allergic rhinitis 04/07/2007  . DERMATITIS DUE TO SOLAR RADIATION 04/07/2007  . Premature beat 09/20/2006  . DIVERTICULOSIS, COLON 04/13/2002    Sumner Boast., PT 12/11/2019, 10:52 AM  [place]. [place], [place], [address] Phone: [phone]   Fax:  [phone]  Name: ROSA WYLY MRN: [id number] Date of Birth: 12-25-1950

## 2019-12-13 ENCOUNTER — Other Ambulatory Visit: Payer: Self-pay

## 2019-12-13 ENCOUNTER — Encounter: Payer: Self-pay | Admitting: Physical Therapy

## 2019-12-13 ENCOUNTER — Ambulatory Visit: Payer: Medicare PPO | Admitting: Physical Therapy

## 2019-12-13 DIAGNOSIS — R252 Cramp and spasm: Secondary | ICD-10-CM

## 2019-12-13 DIAGNOSIS — M542 Cervicalgia: Secondary | ICD-10-CM | POA: Diagnosis not present

## 2019-12-13 NOTE — Therapy (Signed)
[facility]. [place], [place], [address] Phone: [phone]   Fax:  [phone]  Physical Therapy Treatment  Patient Details  Name: [name] MRN: [id number] Date of Birth: 1950/04/14 Referring Provider (PT): Noemi Chapel   Encounter Date: 12/13/2019   PT End of Session - 12/13/19 1006    Visit Number 6    Number of Visits 12    Date for PT Re-Evaluation 01/14/20    Authorization Type [employer]    PT Start Time 0925    PT Stop Time 1015    PT Time Calculation (min) 50 min    Activity Tolerance Patient tolerated treatment well    Behavior During Therapy [facility] for tasks assessed/performed           Past Medical History:  Diagnosis Date  . Anal fissure   . Anemia    during pregnancy  . Anxiety   . Basal cell carcinoma   . DDD (degenerative disc disease)   . Diverticulosis   . Hypokalemia   . LBP (low back pain)   . Melanoma ([place])   . Migraine   . MVP (mitral valve prolapse) 1990   psa  . Rosacea   . Ulcer    as a child    Past Surgical History:  Procedure Laterality Date  . ABDOMINAL HYSTERECTOMY  1985  . BREAST BIOPSY Left    secondary to infection  . I & D EXTREMITY Right 09/09/2012   Procedure: Minor IRRIGATION AND DEBRIDEMENT EXTREMITY paronychia of right thumb;  Surgeon: Wynonia Sours, MD;  Location: [facility];  Service: Orthopedics;  Laterality: Right;  . I & D EXTREMITY Left 01/26/2014   Procedure: MINOR IINCISION AND DRAINAGE paronychia left index finger;  Surgeon: Daryll Brod, MD;  Location: [facility];  Service: Orthopedics;  Laterality: Left;  left index  . OOPHORECTOMY Left 1994  . REPAIR KNEE LIGAMENT Left 1989  . TONSILLECTOMY  1955  . TUBAL LIGATION    . VEIN SURGERY      There were no vitals filed for this visit.   Subjective Assessment - 12/13/19 0930    Subjective Reports doing well reports her hands hurt from arthritis.    Currently in Pain? Yes     Pain Score 3     Pain Location Neck    Pain Orientation Right              [facility] PT Assessment - 12/13/19 0001      AROM   Overall AROM Comments Cervical extension WNL's, still tight with side bending now decreased 50%                         [facility] Adult PT Treatment/Exercise - 12/13/19 0001      Neck Exercises: Machines for Strengthening   Nustep level 5 x 6 minutes    Cybex Row 20# 2x10    Lat Pull 20# 2x10    Other Machines for Strengthening 5# straight arm pulls cues for posture and core activation, tried 10# did this 5x then decreased    Other Machines for Strengthening 5# biceps, 20# triceps 3x10 each      Neck Exercises: Theraband   Shoulder External Rotation Red;20 reps    Horizontal ABduction 20 reps;Red      Neck Exercises: Standing   Other Standing Exercises W backs 2#    Other Standing Exercises I, Y, T 1#  2x10      Neck Exercises: Seated   Other Seated Exercise 3# bent over row, 2# bent over extesion and then no weight reverse flies all 2x10    Other Seated Exercise isometric abs      Manual Therapy   Manual Therapy Soft tissue mobilization;Manual Traction    Manual therapy comments Neural stretches of the left UE    Soft tissue mobilization to bilateral upper traps and into the rhomboids                    PT Short Term Goals - 11/23/19 0922      PT SHORT TERM GOAL #1   Status Partially Met             PT Long Term Goals - 12/13/19 1011      PT LONG TERM GOAL #1   Title decrease pain 50%    Status Partially Met      PT LONG TERM GOAL #2   Title increase cervical ROM 25%    Status Partially Met                 Plan - 12/13/19 1007    Clinical Impression Statement Increase in cervical ROM, she has some tingling in the left fingers with arm reaching, I did a little more neural tension stretching of the UE and this was very tight and did increase the pain and tingling in the hand.    PT Next Visit Plan she  may see the MD next week    Consulted and Agree with Plan of Care Patient           Patient will benefit from skilled therapeutic intervention in order to improve the following deficits and impairments:  Decreased range of motion, Impaired UE functional use, Increased muscle spasms, Pain, Improper body mechanics, Postural dysfunction, Decreased strength  Visit Diagnosis: Cervicalgia  Cramp and spasm     Problem List Patient Active Problem List   Diagnosis Date Noted  . Insect bite of left foot 11/24/2019  . Pustule 11/24/2019  . Chronic pain of right thumb 03/09/2019  . External hemorrhoids 03/03/2018  . Other chest pain 02/23/2018  . Insomnia 02/24/2017  . Healthcare maintenance 02/24/2017  . Conjunctivitis 03/01/2014  . Seborrheic keratosis, inflamed 01/05/2012  . Postmenopausal disorder 12/22/2011  . Skin cancer 07/14/2011  . Migraine 05/14/2007  . IRRITABLE BOWEL SYNDROME 05/14/2007  . Allergic rhinitis 04/07/2007  . DERMATITIS DUE TO SOLAR RADIATION 04/07/2007  . Premature beat 09/20/2006  . DIVERTICULOSIS, COLON 04/13/2002    Sumner Boast., PT 12/13/2019, 10:12 AM  [place]. [place], [place], [address] Phone: [phone]   Fax:  [phone]  Name: GENENE KILMAN MRN: [id number] Date of Birth: 27-Dec-1950

## 2019-12-19 ENCOUNTER — Ambulatory Visit (INDEPENDENT_AMBULATORY_CARE_PROVIDER_SITE_OTHER)
Admission: RE | Admit: 2019-12-19 | Discharge: 2019-12-19 | Disposition: A | Discharge status: Home / Self Care | Payer: Medicare PPO | Source: Ambulatory Visit | Attending: Family Medicine | Admitting: Family Medicine

## 2019-12-19 ENCOUNTER — Ambulatory Visit: Payer: Medicare PPO | Admitting: Physical Therapy

## 2019-12-19 ENCOUNTER — Encounter: Payer: Self-pay | Admitting: Physical Therapy

## 2019-12-19 ENCOUNTER — Other Ambulatory Visit: Payer: Self-pay | Admitting: Family Medicine

## 2019-12-19 ENCOUNTER — Other Ambulatory Visit: Payer: Self-pay

## 2019-12-19 DIAGNOSIS — Z0000 Encounter for general adult medical examination without abnormal findings: Secondary | ICD-10-CM

## 2019-12-19 DIAGNOSIS — R252 Cramp and spasm: Secondary | ICD-10-CM | POA: Diagnosis not present

## 2019-12-19 DIAGNOSIS — M542 Cervicalgia: Secondary | ICD-10-CM | POA: Diagnosis not present

## 2019-12-19 NOTE — Therapy (Signed)
[place] [place], [place], [address] Phone: [phone]   Fax:  [phone]  Physical Therapy Treatment  Patient Details  Name: [name] MRN: [id number] Date of Birth: 1951-01-06 Referring Provider (PT): Noemi Chapel   Encounter Date: 12/19/2019   PT End of Session - 12/19/19 1043    Visit Number 7    Number of Visits 12    Date for PT Re-Evaluation 01/14/20    Authorization Type [employer]    PT Start Time 7824    PT Stop Time 1055    PT Time Calculation (min) 41 min    Activity Tolerance Patient tolerated treatment well    Behavior During Therapy [facility] for tasks assessed/performed           Past Medical History:  Diagnosis Date  . Anal fissure   . Anemia    during pregnancy  . Anxiety   . Basal cell carcinoma   . DDD (degenerative disc disease)   . Diverticulosis   . Hypokalemia   . LBP (low back pain)   . Melanoma ([place])   . Migraine   . MVP (mitral valve prolapse) 1990   psa  . Rosacea   . Ulcer    as a child    Past Surgical History:  Procedure Laterality Date  . ABDOMINAL HYSTERECTOMY  1985  . BREAST BIOPSY Left    secondary to infection  . I & D EXTREMITY Right 09/09/2012   Procedure: Minor IRRIGATION AND DEBRIDEMENT EXTREMITY paronychia of right thumb;  Surgeon: Wynonia Sours, MD;  Location: [facility];  Service: Orthopedics;  Laterality: Right;  . I & D EXTREMITY Left 01/26/2014   Procedure: MINOR IINCISION AND DRAINAGE paronychia left index finger;  Surgeon: Daryll Brod, MD;  Location: [facility];  Service: Orthopedics;  Laterality: Left;  left index  . OOPHORECTOMY Left 1994  . REPAIR KNEE LIGAMENT Left 1989  . TONSILLECTOMY  1955  . TUBAL LIGATION    . VEIN SURGERY      There were no vitals filed for this visit.   Subjective Assessment - 12/19/19 1015    Subjective feeling pretty good, hands still hurt    Currently in Pain? Yes    Pain Score 1      Pain Location Neck    Pain Orientation Right    Pain Descriptors / Indicators Spasm    Pain Relieving Factors I think the exercises are helping                             [facility] Adult PT Treatment/Exercise - 12/19/19 0001      Neck Exercises: Machines for Strengthening   UBE (Upper Arm Bike) level 1 x 5 minutes    Cybex Row 20# 2x10    Lat Pull 20# 2x10    Other Machines for Strengthening 5# straight arm pulls cues for posture and core activation, tried 10# did this 5x then decreased    Other Machines for Strengthening 10# biceps, 20# triceps 3x10 each      Neck Exercises: Standing   Other Standing Exercises W backs 2#, weighted ball  over heald lift    Other Standing Exercises I, Y, T 1# 2x10      Neck Exercises: Seated   Shoulder Shrugs 20 reps    Shoulder Shrugs Limitations 4# with some upper trap and levator strethes    Other  Seated Exercise 4# bent over row, 2# bent over extesion and then no weight reverse flies all 2x10    Other Seated Exercise isometric abs                  PT Education - 12/19/19 1043    Education Details educated her on paraffin as she has had some thumb pain with the exercises, also talked about gym and waht she could do at the gym    Person(s) Educated Patient    Methods Explanation;Demonstration    Comprehension Verbalized understanding            PT Short Term Goals - 11/23/19 0922      PT SHORT TERM GOAL #1   Status Partially Met             PT Long Term Goals - 12/19/19 1046      PT LONG TERM GOAL #1   Title decrease pain 50%    Status Partially Met      PT LONG TERM GOAL #2   Title increase cervical ROM 25%    Status Partially Met      PT LONG TERM GOAL #3   Title understand posture and body mechanics    Status Partially Met      PT LONG TERM GOAL #4   Title report no difficulty sleeping    Status Partially Met                 Plan - 12/19/19 1044    Clinical Impression Statement  Patient doing better with her pain levels and feeling stronger, we have some limitations due to thumb arthritic pain, I spoke with her about paraffin today that may help pain and help ease her motions.  She reports that she has had less numness and tingling    PT Next Visit Plan see what her MD says    Consulted and Agree with Plan of Care Patient           Patient will benefit from skilled therapeutic intervention in order to improve the following deficits and impairments:  Decreased range of motion, Impaired UE functional use, Increased muscle spasms, Pain, Improper body mechanics, Postural dysfunction, Decreased strength  Visit Diagnosis: Cervicalgia  Cramp and spasm     Problem List Patient Active Problem List   Diagnosis Date Noted  . Insect bite of left foot 11/24/2019  . Pustule 11/24/2019  . Chronic pain of right thumb 03/09/2019  . External hemorrhoids 03/03/2018  . Other chest pain 02/23/2018  . Insomnia 02/24/2017  . Healthcare maintenance 02/24/2017  . Conjunctivitis 03/01/2014  . Seborrheic keratosis, inflamed 01/05/2012  . Postmenopausal disorder 12/22/2011  . Skin cancer 07/14/2011  . Migraine 05/14/2007  . IRRITABLE BOWEL SYNDROME 05/14/2007  . Allergic rhinitis 04/07/2007  . DERMATITIS DUE TO SOLAR RADIATION 04/07/2007  . Premature beat 09/20/2006  . DIVERTICULOSIS, COLON 04/13/2002    Sumner Boast., PT 12/19/2019, 10:47 AM  [place] [place], [place], [address] Phone: [phone]   Fax:  [phone]  Name: [name] MRN: [id number] Date of Birth: 06/22/1950

## 2019-12-20 ENCOUNTER — Inpatient Hospital Stay: Admission: RE | Admit: 2019-12-20 | Payer: Medicare PPO | Source: Ambulatory Visit

## 2019-12-21 ENCOUNTER — Ambulatory Visit: Payer: Medicare PPO | Admitting: Physical Therapy

## 2019-12-21 DIAGNOSIS — M1811 Unilateral primary osteoarthritis of first carpometacarpal joint, right hand: Secondary | ICD-10-CM | POA: Diagnosis not present

## 2019-12-21 DIAGNOSIS — M542 Cervicalgia: Secondary | ICD-10-CM | POA: Diagnosis not present

## 2019-12-25 ENCOUNTER — Telehealth: Payer: Self-pay | Admitting: Family Medicine

## 2019-12-25 MED ORDER — ALENDRONATE SODIUM 70 MG PO TABS: 70.0000 mg | ORAL_TABLET | ORAL | 11 refills | Status: DC

## 2019-12-25 NOTE — Telephone Encounter (Signed)
Patient calling to go over bone density results. Advised patient that Provider have not had a chance to view results but we will give he a call as soon as results are viewed. Please advise

## 2019-12-25 NOTE — Telephone Encounter (Signed)
Pt calling and wants to discuss most recent bone density scan and said that she would like to get labs done to recheck her calcium as well. Please advise. Callback [phone].

## 2019-12-26 ENCOUNTER — Ambulatory Visit: Payer: Medicare PPO | Admitting: Physical Therapy

## 2019-12-26 ENCOUNTER — Ambulatory Visit: Payer: Medicare PPO | Admitting: Plastic Surgery

## 2019-12-26 ENCOUNTER — Encounter: Payer: Self-pay | Admitting: Physical Therapy

## 2019-12-26 ENCOUNTER — Encounter: Payer: Self-pay | Admitting: Plastic Surgery

## 2019-12-26 ENCOUNTER — Other Ambulatory Visit: Payer: Self-pay

## 2019-12-26 VITALS — BP 137/64 | HR 80

## 2019-12-26 DIAGNOSIS — M542 Cervicalgia: Secondary | ICD-10-CM

## 2019-12-26 DIAGNOSIS — R252 Cramp and spasm: Secondary | ICD-10-CM

## 2019-12-26 DIAGNOSIS — Z719 Counseling, unspecified: Secondary | ICD-10-CM

## 2019-12-26 NOTE — Progress Notes (Signed)
The patient is a 69 year old female here for evaluation of her face.  She has had some laser in the past.  She is not keen on any fillers.  She had a peel recently but was not very happy with it.  She really want skin tightening but does not want to have a facelift done.  We talked about some options that will be good for her overall skin health.  We got her set up for IPL.

## 2019-12-26 NOTE — Therapy (Signed)
[place]. [place], [place], [address] Phone: [phone]   Fax:  [phone]  Physical Therapy Treatment  Patient Details  Name: [name] MRN: [id number] Date of Birth: 31-May-1950 Referring Provider (PT): Noemi Chapel   Encounter Date: 12/26/2019   PT End of Session - 12/26/19 1057    Visit Number 8    Number of Visits 12    Date for PT Re-Evaluation 01/14/20    Authorization Type [employer]    PT Start Time 6962    PT Stop Time 1108    PT Time Calculation (min) 54 min    Activity Tolerance Patient tolerated treatment well    Behavior During Therapy [facility] for tasks assessed/performed           Past Medical History:  Diagnosis Date  . Anal fissure   . Anemia    during pregnancy  . Anxiety   . Basal cell carcinoma   . DDD (degenerative disc disease)   . Diverticulosis   . Hypokalemia   . LBP (low back pain)   . Melanoma ([place])   . Migraine   . MVP (mitral valve prolapse) 1990   psa  . Rosacea   . Ulcer    as a child    Past Surgical History:  Procedure Laterality Date  . ABDOMINAL HYSTERECTOMY  1985  . BREAST BIOPSY Left    secondary to infection  . I & D EXTREMITY Right 09/09/2012   Procedure: Minor IRRIGATION AND DEBRIDEMENT EXTREMITY paronychia of right thumb;  Surgeon: Wynonia Sours, MD;  Location: [facility];  Service: Orthopedics;  Laterality: Right;  . I & D EXTREMITY Left 01/26/2014   Procedure: MINOR IINCISION AND DRAINAGE paronychia left index finger;  Surgeon: Daryll Brod, MD;  Location: [facility];  Service: Orthopedics;  Laterality: Left;  left index  . OOPHORECTOMY Left 1994  . REPAIR KNEE LIGAMENT Left 1989  . TONSILLECTOMY  1955  . TUBAL LIGATION    . VEIN SURGERY      There were no vitals filed for this visit.   Subjective Assessment - 12/26/19 1018    Subjective I am doing better, still hurting some in the neck and the hands    Currently in Pain?  Yes    Pain Score 2     Pain Location Neck    Aggravating Factors  raking, sleeping                             [facility] Adult PT Treatment/Exercise - 12/26/19 0001      Neck Exercises: Machines for Strengthening   UBE (Upper Arm Bike) level 1 x 5 minutes    Nustep level 5 x 6 minutes    Cybex Row 15# 3x10    Lat Pull 20# 2x10    Other Machines for Strengthening 5# straight arm pulls cues for posture, then 10# 2x5    Other Machines for Strengthening 10# biceps, 20# triceps 3x10 each      Neck Exercises: Theraband   Shoulder External Rotation Red;20 reps      Neck Exercises: Standing   Other Standing Exercises 5# overhead carry and 10# farmer carry    Other Standing Exercises I, Y, T 1# 2x10      Neck Exercises: Seated   Other Seated Exercise 4# bent over row, 2# bent over extesion and then no weight reverse flies all  2x10    Other Seated Exercise isometric abs      Moist Heat Therapy   Number Minutes Moist Heat 12 Minutes    Moist Heat Location Cervical      Electrical Stimulation   Electrical Stimulation Location cervical trap area    Electrical Stimulation Action IFC    Electrical Stimulation Parameters sitting    Electrical Stimulation Goals Pain                    PT Short Term Goals - 12/26/19 1112      PT SHORT TERM GOAL #1   Title independent with initial HEP    Status Achieved             PT Long Term Goals - 12/26/19 1112      PT LONG TERM GOAL #1   Title decrease pain 50%    Status Partially Met      PT LONG TERM GOAL #2   Title increase cervical ROM 25%    Status Partially Met      PT LONG TERM GOAL #3   Title understand posture and body mechanics    Status Partially Met      PT LONG TERM GOAL #4   Title report no difficulty sleeping    Status Partially Met                 Plan - 12/26/19 1059    Clinical Impression Statement Patient brought in her bonescan results and had questions about how to build  bone, her femurs were good, had loss of bone density in the spine, I had her do farmer carry and overhead carry to weight the spine and activate the core without motions.  With the exercises she started having some pain in the right upper trap.    PT Next Visit Plan We will continue may add some hand activities and see if the core work was okay           Patient will benefit from skilled therapeutic intervention in order to improve the following deficits and impairments:  Decreased range of motion, Impaired UE functional use, Increased muscle spasms, Pain, Improper body mechanics, Postural dysfunction, Decreased strength  Visit Diagnosis: Cervicalgia  Cramp and spasm     Problem List Patient Active Problem List   Diagnosis Date Noted  . Insect bite of left foot 11/24/2019  . Pustule 11/24/2019  . Chronic pain of right thumb 03/09/2019  . External hemorrhoids 03/03/2018  . Other chest pain 02/23/2018  . Insomnia 02/24/2017  . Healthcare maintenance 02/24/2017  . Conjunctivitis 03/01/2014  . Seborrheic keratosis, inflamed 01/05/2012  . Postmenopausal disorder 12/22/2011  . Skin cancer 07/14/2011  . Migraine 05/14/2007  . IRRITABLE BOWEL SYNDROME 05/14/2007  . Allergic rhinitis 04/07/2007  . DERMATITIS DUE TO SOLAR RADIATION 04/07/2007  . Premature beat 09/20/2006  . DIVERTICULOSIS, COLON 04/13/2002    Sumner Boast., PT 12/26/2019, 11:13 AM  [place]. [place], [place], [address] Phone: [phone]   Fax:  [phone]  Name: [name] MRN: [id number] Date of Birth: Feb 13, 1950

## 2019-12-26 NOTE — Telephone Encounter (Signed)
Spoke with patient who verbally understood results and recommendations. Per patient she have tried Fosamax in the past and it cause major headaches so she would not be interested in trying this again, if there was something else she could try she would. Patient wanted to inform you that she was currently taking 1 Mobic pill a day for arthritis and would like to know if she could come in to have labs drawn to check calcium levels. Also would like to know if she has osteoporosis or still has osteopenia? Please advise.

## 2019-12-27 ENCOUNTER — Telehealth: Payer: Self-pay

## 2019-12-27 NOTE — Telephone Encounter (Signed)
Lab order placed for calcium level Yes ok to take fosamax and calcium

## 2019-12-27 NOTE — Telephone Encounter (Signed)
Pt calling back to follow up on repeat lab work for her cholesterol and calcium level.  Pt also wanted [place] to know that she can not take Fosomax, which she said was sent into her pharmacy. Please advise, [phone]

## 2019-12-27 NOTE — Telephone Encounter (Signed)
Patient would like to come in to have calcium rechecked so she will know if she should continue with her medications. Patient aware that recent bone density show osteoporosis, patient no much interested in starting on Fosamax due to headaches that she have had in the past with it. Per patient she will try it again since it's been a while she she have had the medication. She would like calcium checked before starting on Fosamax. [facility] sent in Rx but will not be in until next week. Is it okay for patient to come in for labs and would it be okay for her to continue taking Calcium OTC with Fosamax? Please advise.

## 2019-12-28 LAB — BASIC METABOLIC PANEL
Creatinine: 0.7 (ref <=1.1)
Glucose: 103

## 2019-12-28 NOTE — Telephone Encounter (Signed)
Patient aware of message below.

## 2020-01-02 ENCOUNTER — Other Ambulatory Visit: Payer: Self-pay

## 2020-01-02 ENCOUNTER — Ambulatory Visit: Payer: Medicare PPO | Admitting: Physical Therapy

## 2020-01-02 ENCOUNTER — Encounter: Payer: Self-pay | Admitting: Physical Therapy

## 2020-01-02 DIAGNOSIS — R252 Cramp and spasm: Secondary | ICD-10-CM | POA: Diagnosis not present

## 2020-01-02 DIAGNOSIS — M542 Cervicalgia: Secondary | ICD-10-CM | POA: Diagnosis not present

## 2020-01-02 NOTE — Therapy (Signed)
[place]. [place], [place], [address] Phone: [phone]   Fax:  [phone]  Physical Therapy Treatment  Patient Details  Name: CASSIA FEIN MRN: [id number] Date of Birth: 08/04/1950 Referring Provider (PT): Noemi Chapel   Encounter Date: 01/02/2020   PT End of Session - 01/02/20 1149    Visit Number 9    Number of Visits 12    Date for PT Re-Evaluation 01/14/20    Authorization Type [employer]    PT Start Time 1058    PT Stop Time 1158    PT Time Calculation (min) 60 min    Activity Tolerance Patient tolerated treatment well    Behavior During Therapy [facility] for tasks assessed/performed           Past Medical History:  Diagnosis Date  . Anal fissure   . Anemia    during pregnancy  . Anxiety   . Basal cell carcinoma   . DDD (degenerative disc disease)   . Diverticulosis   . Hypokalemia   . LBP (low back pain)   . Melanoma ([place])   . Migraine   . MVP (mitral valve prolapse) 1990   psa  . Rosacea   . Ulcer    as a child    Past Surgical History:  Procedure Laterality Date  . ABDOMINAL HYSTERECTOMY  1985  . BREAST BIOPSY Left    secondary to infection  . I & D EXTREMITY Right 09/09/2012   Procedure: Minor IRRIGATION AND DEBRIDEMENT EXTREMITY paronychia of right thumb;  Surgeon: Wynonia Sours, MD;  Location: [facility];  Service: Orthopedics;  Laterality: Right;  . I & D EXTREMITY Left 01/26/2014   Procedure: MINOR IINCISION AND DRAINAGE paronychia left index finger;  Surgeon: Daryll Brod, MD;  Location: [facility];  Service: Orthopedics;  Laterality: Left;  left index  . OOPHORECTOMY Left 1994  . REPAIR KNEE LIGAMENT Left 1989  . TONSILLECTOMY  1955  . TUBAL LIGATION    . VEIN SURGERY      There were no vitals filed for this visit.   Subjective Assessment - 01/02/20 1111    Subjective I am sore, I did a lot of cleaning on the house    Currently in Pain? Yes    Pain  Score 4     Pain Location Neck    Aggravating Factors  cleaning                             [facility] Adult PT Treatment/Exercise - 01/02/20 0001      Neck Exercises: Machines for Strengthening   UBE (Upper Arm Bike) level 2 x 5 minutes    Cybex Row 15# 3x10    Cybex Chest Press 5# 2x10    Lat Pull 20# 3x10    Other Machines for Strengthening 10# 2x10, then 5# x10    Other Machines for Strengthening 10# biceps, 20# triceps 3x10 each      Neck Exercises: Theraband   Shoulder External Rotation Red;20 reps      Neck Exercises: Standing   Other Standing Exercises 2# w backs, 10# farmer carry , 8# overhead carry    Other Standing Exercises I, Y, T 2# 2x10 facing away from the wall      Neck Exercises: Seated   Shoulder Shrugs 20 reps    Shoulder Shrugs Limitations 4# with some upper trap and levator strethes  Other Seated Exercise 4# bent over row, 2# bent over extesion and then no weight reverse flies all 2x10    Other Seated Exercise isometric abs      Manual Therapy   Manual Therapy Soft tissue mobilization;Manual Traction    Manual therapy comments Neural stretches of the left UE    Soft tissue mobilization to bilateral upper traps and into the cervical paraspinals                    PT Short Term Goals - 12/26/19 1112      PT SHORT TERM GOAL #1   Title independent with initial HEP    Status Achieved             PT Long Term Goals - 01/02/20 1155      PT LONG TERM GOAL #1   Title decrease pain 50%      PT LONG TERM GOAL #2   Title increase cervical ROM 25%    Status Achieved                 Plan - 01/02/20 1150    Clinical Impression Statement Patient reports that she scrubbed a full kitchen floor on her hands and knees, reports that she was surprised that she was able to do it, but that she is stiff and sore with more spasms in the upper traps and the neck.  She was very tight and very sore here , she tolerated all of the  exercises without issue just the increased soreness and tightness    PT Next Visit Plan We will continue may add some hand activities and see if the core work was okay    Consulted and Agree with Plan of Care Patient           Patient will benefit from skilled therapeutic intervention in order to improve the following deficits and impairments:  Decreased range of motion, Impaired UE functional use, Increased muscle spasms, Pain, Improper body mechanics, Postural dysfunction, Decreased strength  Visit Diagnosis: Cervicalgia  Cramp and spasm     Problem List Patient Active Problem List   Diagnosis Date Noted  . Insect bite of left foot 11/24/2019  . Pustule 11/24/2019  . Chronic pain of right thumb 03/09/2019  . External hemorrhoids 03/03/2018  . Other chest pain 02/23/2018  . Insomnia 02/24/2017  . Encounter for counseling 02/24/2017  . Conjunctivitis 03/01/2014  . Seborrheic keratosis, inflamed 01/05/2012  . Postmenopausal disorder 12/22/2011  . Skin cancer 07/14/2011  . Migraine 05/14/2007  . IRRITABLE BOWEL SYNDROME 05/14/2007  . Allergic rhinitis 04/07/2007  . DERMATITIS DUE TO SOLAR RADIATION 04/07/2007  . Premature beat 09/20/2006  . DIVERTICULOSIS, COLON 04/13/2002    Sumner Boast., PT 01/02/2020, 11:56 AM  [place]. [place], [place], [address] Phone: [phone]   Fax:  [phone]  Name: ANUHEA GASSNER MRN: [id number] Date of Birth: May 10, 1950

## 2020-01-10 ENCOUNTER — Encounter: Payer: Self-pay | Admitting: Physical Therapy

## 2020-01-10 ENCOUNTER — Other Ambulatory Visit: Payer: Self-pay

## 2020-01-10 ENCOUNTER — Ambulatory Visit: Payer: Medicare PPO | Attending: Orthopedic Surgery | Admitting: Physical Therapy

## 2020-01-10 DIAGNOSIS — R252 Cramp and spasm: Secondary | ICD-10-CM | POA: Diagnosis not present

## 2020-01-10 DIAGNOSIS — M542 Cervicalgia: Secondary | ICD-10-CM | POA: Diagnosis not present

## 2020-01-10 NOTE — Therapy (Signed)
[place]. [place], [place], [address] Phone: [phone]   Fax:  [phone] Progress Note Reporting Period 11/14/19 to 01/10/20 for the first 10 visits  See note below for Objective Data and Assessment of Progress/Goals.      Physical Therapy Treatment  Patient Details  Name: [name] MRN: [id number] Date of Birth: 1950/08/23 Referring Provider (PT): Noemi Chapel   Encounter Date: 01/10/2020   PT End of Session - 01/10/20 1142    Visit Number 10    Number of Visits 12    Date for PT Re-Evaluation 01/14/20    Authorization Type [employer]    PT Start Time 1011    PT Stop Time 1100    PT Time Calculation (min) 49 min    Activity Tolerance Patient tolerated treatment well    Behavior During Therapy [facility] for tasks assessed/performed           Past Medical History:  Diagnosis Date   Anal fissure    Anemia    during pregnancy   Anxiety    Basal cell carcinoma    DDD (degenerative disc disease)    Diverticulosis    Hypokalemia    LBP (low back pain)    Melanoma ([facility])    Migraine    MVP (mitral valve prolapse) 1990   psa   Rosacea    Ulcer    as a child    Past Surgical History:  Procedure Laterality Date   ABDOMINAL HYSTERECTOMY  1985   BREAST BIOPSY Left    secondary to infection   I & D EXTREMITY Right 09/09/2012   Procedure: Minor IRRIGATION AND DEBRIDEMENT EXTREMITY paronychia of right thumb;  Surgeon: Wynonia Sours, MD;  Location: [facility];  Service: Orthopedics;  Laterality: Right;   I & D EXTREMITY Left 01/26/2014   Procedure: MINOR IINCISION AND DRAINAGE paronychia left index finger;  Surgeon: Daryll Brod, MD;  Location: [facility];  Service: Orthopedics;  Laterality: Left;  left index   OOPHORECTOMY Left 1994   REPAIR KNEE LIGAMENT Left 1989   TONSILLECTOMY  1955   TUBAL LIGATION     VEIN SURGERY      There were no vitals filed for  this visit.   Subjective Assessment - 01/10/20 1018    Subjective I had a pretty good weekend and holiday, less pain, better motions    Currently in Pain? Yes    Pain Score 3     Pain Location Neck    Pain Orientation Right    Pain Descriptors / Indicators Tightness    Aggravating Factors  cleaning, cooking                             [facility] Adult PT Treatment/Exercise - 01/10/20 0001      Neck Exercises: Machines for Strengthening   Nustep level 5 x 6 minutes    Cybex Row 15# 3x10    Lat Pull 20# 3x10    Other Machines for Strengthening 10# 2x10      Neck Exercises: Theraband   Shoulder External Rotation Red;20 reps      Neck Exercises: Standing   Other Standing Exercises 2# w backs, 10# farmer carry , 8# overhead carry      Neck Exercises: Seated   Shoulder Shrugs 20 reps    Shoulder Shrugs Limitations 5# with some upper trap and levator strethes  Other Seated Exercise 4# bent over row, 2# bent over extesion and then reverse flies all 2x10    Other Seated Exercise isometric abs      Manual Therapy   Manual Therapy Soft tissue mobilization;Manual Traction    Manual therapy comments Neural stretches of the left UE    Soft tissue mobilization to bilateral upper traps and into the cervical paraspinals                    PT Short Term Goals - 12/26/19 1112      PT SHORT TERM GOAL #1   Title independent with initial HEP    Status Achieved             PT Long Term Goals - 01/10/20 1151      PT LONG TERM GOAL #1   Title decrease pain 50%    Status Partially Met      PT LONG TERM GOAL #2   Title increase cervical ROM 25%    Status Achieved      PT LONG TERM GOAL #3   Title understand posture and body mechanics    Status Partially Met      PT LONG TERM GOAL #4   Title report no difficulty sleeping    Status Achieved                 Plan - 01/10/20 1143    Clinical Impression Statement Patient doing great with the ROM  and strength, she does need cues for form she will have some pain at times nad needs cues to alter the motion or decrease the shoulder elevation, she continues to have the significant knot and trigger points in the right upper trap and the cervical area.    PT Next Visit Plan I feel like we need to continue once a week for a month to get her a good HEP that she is safe with or she is also talking about going to a gym since she feels so good after exercising here.    Consulted and Agree with Plan of Care Patient           Patient will benefit from skilled therapeutic intervention in order to improve the following deficits and impairments:  Decreased range of motion, Impaired UE functional use, Increased muscle spasms, Pain, Improper body mechanics, Postural dysfunction, Decreased strength  Visit Diagnosis: Cervicalgia  Cramp and spasm     Problem List Patient Active Problem List   Diagnosis Date Noted   Insect bite of left foot 11/24/2019   Pustule 11/24/2019   Chronic pain of right thumb 03/09/2019   External hemorrhoids 03/03/2018   Other chest pain 02/23/2018   Insomnia 02/24/2017   Encounter for counseling 02/24/2017   Conjunctivitis 03/01/2014   Seborrheic keratosis, inflamed 01/05/2012   Postmenopausal disorder 12/22/2011   Skin cancer 07/14/2011   Migraine 05/14/2007   IRRITABLE BOWEL SYNDROME 05/14/2007   Allergic rhinitis 04/07/2007   DERMATITIS DUE TO SOLAR RADIATION 04/07/2007   Premature beat 09/20/2006   DIVERTICULOSIS, COLON 04/13/2002    Sumner Boast., PT 01/10/2020, 11:52 AM  [place]. [place], [place], [address] Phone: [phone]   Fax:  [phone]  Name: [name] MRN: [id number] Date of Birth: 01/07/1951

## 2020-01-15 ENCOUNTER — Other Ambulatory Visit: Payer: Self-pay

## 2020-01-16 ENCOUNTER — Ambulatory Visit: Payer: Medicare PPO | Admitting: Family

## 2020-01-16 ENCOUNTER — Encounter: Payer: Self-pay | Admitting: Physical Therapy

## 2020-01-16 ENCOUNTER — Ambulatory Visit: Payer: Medicare PPO | Admitting: Physical Therapy

## 2020-01-16 ENCOUNTER — Encounter: Payer: Self-pay | Admitting: Family

## 2020-01-16 VITALS — BP 118/64 | HR 71 | Temp 96.7°F | Ht 61.0 in | Wt 116.0 lb

## 2020-01-16 DIAGNOSIS — R252 Cramp and spasm: Secondary | ICD-10-CM

## 2020-01-16 DIAGNOSIS — H6121 Impacted cerumen, right ear: Secondary | ICD-10-CM

## 2020-01-16 DIAGNOSIS — M542 Cervicalgia: Secondary | ICD-10-CM

## 2020-01-16 NOTE — Therapy (Signed)
[address] [place], [place], [address] Phone: [phone]   Fax:  [phone]  Physical Therapy Treatment  Patient Details  Name: [name] MRN: [id number] Date of Birth: 1950/04/16 Referring Provider (PT): Noemi Chapel   Encounter Date: 01/16/2020   PT End of Session - 01/16/20 1102    Visit Number 11    Date for PT Re-Evaluation 03/10/20    Authorization Type [employer]    PT Start Time 1013    PT Stop Time 1056    PT Time Calculation (min) 43 min    Activity Tolerance Patient tolerated treatment well    Behavior During Therapy [facility] for tasks assessed/performed           Past Medical History:  Diagnosis Date  . Anal fissure   . Anemia    during pregnancy  . Anxiety   . Basal cell carcinoma   . DDD (degenerative disc disease)   . Diverticulosis   . Hypokalemia   . LBP (low back pain)   . Melanoma ([place])   . Migraine   . MVP (mitral valve prolapse) 1990   psa  . Rosacea   . Ulcer    as a child    Past Surgical History:  Procedure Laterality Date  . ABDOMINAL HYSTERECTOMY  1985  . BREAST BIOPSY Left    secondary to infection  . I & D EXTREMITY Right 09/09/2012   Procedure: Minor IRRIGATION AND DEBRIDEMENT EXTREMITY paronychia of right thumb;  Surgeon: Wynonia Sours, MD;  Location: [place];  Service: Orthopedics;  Laterality: Right;  . I & D EXTREMITY Left 01/26/2014   Procedure: MINOR IINCISION AND DRAINAGE paronychia left index finger;  Surgeon: Daryll Brod, MD;  Location: [place];  Service: Orthopedics;  Laterality: Left;  left index  . OOPHORECTOMY Left 1994  . REPAIR KNEE LIGAMENT Left 1989  . TONSILLECTOMY  1955  . TUBAL LIGATION    . VEIN SURGERY      There were no vitals filed for this visit.   Subjective Assessment - 01/16/20 1017    Subjective Still doing well, feeling good    Currently in Pain? Yes    Pain Score 2     Pain Location Neck    Pain  Orientation Right    Aggravating Factors  cooking                             [facility] Adult PT Treatment/Exercise - 01/16/20 0001      Neck Exercises: Machines for Strengthening   Nustep level 5 x 6 minutes    Cybex Row 15# 3x10    Lat Pull 20# 3x10    Other Machines for Strengthening 10# 2x10    Other Machines for Strengthening 10# biceps, 20# triceps 3x10 each                    PT Short Term Goals - 12/26/19 1112      PT SHORT TERM GOAL #1   Title independent with initial HEP    Status Achieved             PT Long Term Goals - 01/10/20 1151      PT LONG TERM GOAL #1   Title decrease pain 50%    Status Partially Met      PT LONG TERM GOAL #2   Title increase cervical  ROM 25%    Status Achieved      PT LONG TERM GOAL #3   Title understand posture and body mechanics    Status Partially Met      PT LONG TERM GOAL #4   Title report no difficulty sleeping    Status Achieved                 Plan - 01/16/20 1104    Clinical Impression Statement As we were doing exercises she started c/o some pain in the right buttock area, she was tender to palpation here, I decided to try some stretching ans eh was very very tight and painful with HS, calf, pirifromis and adductors which could cause this so we focused on stretching and giving her an HEP for this    PT Next Visit Plan I feel like we need to continue once a week for a month to get her a good HEP that she is safe with or she is also talking about going to a gym since she feels so good after exercising here.    Consulted and Agree with Plan of Care Patient           Patient will benefit from skilled therapeutic intervention in order to improve the following deficits and impairments:  Decreased range of motion, Impaired UE functional use, Increased muscle spasms, Pain, Improper body mechanics, Postural dysfunction, Decreased strength  Visit Diagnosis: Cervicalgia  Cramp and  spasm     Problem List Patient Active Problem List   Diagnosis Date Noted  . Insect bite of left foot 11/24/2019  . Pustule 11/24/2019  . Chronic pain of right thumb 03/09/2019  . External hemorrhoids 03/03/2018  . Other chest pain 02/23/2018  . Insomnia 02/24/2017  . Encounter for counseling 02/24/2017  . Conjunctivitis 03/01/2014  . Seborrheic keratosis, inflamed 01/05/2012  . Postmenopausal disorder 12/22/2011  . Skin cancer 07/14/2011  . Migraine 05/14/2007  . IRRITABLE BOWEL SYNDROME 05/14/2007  . Allergic rhinitis 04/07/2007  . DERMATITIS DUE TO SOLAR RADIATION 04/07/2007  . Premature beat 09/20/2006  . DIVERTICULOSIS, COLON 04/13/2002    Sumner Boast., PT 01/16/2020, 11:06 AM  [address] [place], [place], [address] Phone: [phone]   Fax:  [phone]  Name: [name] MRN: [id number] Date of Birth: [birth year]

## 2020-01-16 NOTE — Patient Instructions (Signed)
Access Code: [email or web address] URL: [email or web address] Date: 01/16/2020 Prepared by: Lum Babe  Exercises Supine Piriformis Stretch Pulling Heel to Hip - 2 x daily - 7 x weekly - 1 sets - 5 reps - 20 hold Hooklying Hamstring Stretch with Strap - 2 x daily - 7 x weekly - 1 sets - 5 reps - 20 hold Seated Hamstring Stretch with Chair - 2 x daily - 7 x weekly - 1 sets - 5 reps - 20 hold Supine Hip Adductor Stretch - 2 x daily - 7 x weekly - 1 sets - 5 reps - 20 hold

## 2020-01-17 ENCOUNTER — Encounter: Payer: Self-pay | Admitting: Family

## 2020-01-17 DIAGNOSIS — L309 Dermatitis, unspecified: Secondary | ICD-10-CM | POA: Diagnosis not present

## 2020-01-17 DIAGNOSIS — L738 Other specified follicular disorders: Secondary | ICD-10-CM | POA: Diagnosis not present

## 2020-01-17 DIAGNOSIS — D225 Melanocytic nevi of trunk: Secondary | ICD-10-CM | POA: Diagnosis not present

## 2020-01-17 DIAGNOSIS — Z85828 Personal history of other malignant neoplasm of skin: Secondary | ICD-10-CM | POA: Diagnosis not present

## 2020-01-17 DIAGNOSIS — L821 Other seborrheic keratosis: Secondary | ICD-10-CM | POA: Diagnosis not present

## 2020-01-17 DIAGNOSIS — D2271 Melanocytic nevi of right lower limb, including hip: Secondary | ICD-10-CM | POA: Diagnosis not present

## 2020-01-17 DIAGNOSIS — L910 Hypertrophic scar: Secondary | ICD-10-CM | POA: Diagnosis not present

## 2020-01-17 NOTE — Patient Instructions (Signed)
Earwax Buildup, Adult The ears produce a substance called earwax that helps keep bacteria out of the ear and protects the skin in the ear canal. Occasionally, earwax can build up in the ear and cause discomfort or hearing loss. What increases the risk? This condition is more likely to develop in people who:  Are female.  Are elderly.  Naturally produce more earwax.  Clean their ears often with cotton swabs.  Use earplugs often.  Use in-ear headphones often.  Wear hearing aids.  Have narrow ear canals.  Have earwax that is overly thick or sticky.  Have eczema.  Are dehydrated.  Have excess hair in the ear canal. What are the signs or symptoms? Symptoms of this condition include:  Reduced or muffled hearing.  A feeling of fullness in the ear or feeling that the ear is plugged.  Fluid coming from the ear.  Ear pain.  Ear itch.  Ringing in the ear.  Coughing.  An obvious piece of earwax that can be seen inside the ear canal. How is this diagnosed? This condition may be diagnosed based on:  Your symptoms.  Your medical history.  An ear exam. During the exam, your health care provider will look into your ear with an instrument called an otoscope. You may have tests, including a hearing test. How is this treated? This condition may be treated by:  Using ear drops to soften the earwax.  Having the earwax removed by a health care provider. The health care provider may: ? Flush the ear with water. ? Use an instrument that has a loop on the end (curette). ? Use a suction device.  Surgery to remove the wax buildup. This may be done in severe cases. Follow these instructions at home:   Take over-the-counter and prescription medicines only as told by your health care provider.  Do not put any objects, including cotton swabs, into your ear. You can clean the opening of your ear canal with a washcloth or facial tissue.  Follow instructions from your health care  provider about cleaning your ears. Do not over-clean your ears.  Drink enough fluid to keep your urine clear or pale yellow. This will help to thin the earwax.  Keep all follow-up visits as told by your health care provider. If earwax builds up in your ears often or if you use hearing aids, consider seeing your health care provider for routine, preventive ear cleanings. Ask your health care provider how often you should schedule your cleanings.  If you have hearing aids, clean them according to instructions from the manufacturer and your health care provider. Contact a health care provider if:  You have ear pain.  You develop a fever.  You have blood, pus, or other fluid coming from your ear.  You have hearing loss.  You have ringing in your ears that does not go away.  Your symptoms do not improve with treatment.  You feel like the room is spinning (vertigo). Summary  Earwax can build up in the ear and cause discomfort or hearing loss.  The most common symptoms of this condition include reduced or muffled hearing and a feeling of fullness in the ear or feeling that the ear is plugged.  This condition may be diagnosed based on your symptoms, your medical history, and an ear exam.  This condition may be treated by using ear drops to soften the earwax or by having the earwax removed by a health care provider.  Do not put any   objects, including cotton swabs, into your ear. You can clean the opening of your ear canal with a washcloth or facial tissue. This information is not intended to replace advice given to you by your health care provider. Make sure you discuss any questions you have with your health care provider. Document Revised: 01/08/2017 Document Reviewed: 04/08/2016 Elsevier Patient Education  2020 Elsevier Inc.  

## 2020-01-17 NOTE — Progress Notes (Addendum)
Acute Office Visit  Subjective:    Patient ID: [name], female    DOB: 11/09/1950, 69 y.o.   MRN: [id number]  Chief Complaint  Patient presents with   Cerumen Impaction    both ears clogged up.     HPI Patient is in today with c/o ear feeling clogged up. She has a history of cerumen impactions. Denies any pain.   Past Medical History:  Diagnosis Date   Anal fissure    Anemia    during pregnancy   Anxiety    Basal cell carcinoma    DDD (degenerative disc disease)    Diverticulosis    Hypokalemia    LBP (low back pain)    Melanoma ([facility])    Migraine    MVP (mitral valve prolapse) 1990   psa   Rosacea    Ulcer    as a child    Past Surgical History:  Procedure Laterality Date   ABDOMINAL HYSTERECTOMY  1985   BREAST BIOPSY Left    secondary to infection   I & D EXTREMITY Right 09/09/2012   Procedure: Minor IRRIGATION AND DEBRIDEMENT EXTREMITY paronychia of right thumb;  Surgeon: Wynonia Sours, MD;  Location: [place];  Service: Orthopedics;  Laterality: Right;   I & D EXTREMITY Left 01/26/2014   Procedure: MINOR IINCISION AND DRAINAGE paronychia left index finger;  Surgeon: Daryll Brod, MD;  Location: [place];  Service: Orthopedics;  Laterality: Left;  left index   OOPHORECTOMY Left 1994   REPAIR KNEE LIGAMENT Left 1989   TONSILLECTOMY  1955   TUBAL LIGATION     VEIN SURGERY      Family History  Problem Relation Age of Onset   Heart disease Mother    Colon polyps Mother    Heart disease Father    Colitis Father    Colon polyps Father    Esophageal cancer Maternal Aunt    Colon cancer Neg Hx     Social History   Socioeconomic History   Marital status: Married    Spouse name: Not on file   Number of children: 2   Years of education: Not on file   Highest education level: Not on file  Occupational History   Occupation: retired school system  Tobacco Use   Smoking status: Never  Smoker   Smokeless tobacco: Never Used  Scientific laboratory technician Use: Never used  Substance and Sexual Activity   Alcohol use: Yes    Alcohol/week: 1.0 standard drink    Types: 1 Standard drinks or equivalent per week    Comment: social wine   Drug use: No   Sexual activity: Yes  Other Topics Concern   Not on file  Social History Narrative   Not on file   Social Determinants of Health   Financial Resource Strain: Low Risk    Difficulty of Paying Living Expenses: Not hard at all  Food Insecurity: No Food Insecurity   Worried About Charity fundraiser in the Last Year: Never true   [place] in the Last Year: Never true  Transportation Needs: No Transportation Needs   Lack of Transportation (Medical): No   Lack of Transportation (Non-Medical): No  Physical Activity: Sufficiently Active   Days of Exercise per Week: 4 days   Minutes of Exercise per Session: 40 min  Stress: No Stress Concern Present   Feeling of Stress : Only a little  Social Connections: Socially  Integrated   Frequency of Communication with Friends and Family: More than three times a week   Frequency of Social Gatherings with Friends and Family: Once a week   Attends Religious Services: More than 4 times per year   Active Member of [employer] or Organizations: Yes   Attends Music therapist: More than 4 times per year   Marital Status: Married  Human resources officer Violence: Not At Risk   Fear of Current or Ex-Partner: No   Emotionally Abused: No   Physically Abused: No   Sexually Abused: No    Outpatient Medications Prior to Visit  Medication Sig Dispense Refill   aspirin 81 MG tablet Take 81 mg by mouth. ON OCCASION     atenolol (TENORMIN) 25 MG tablet One half tablet when necessary for palpitations 30 tablet 4   calcium-vitamin D 250-100 MG-UNIT per tablet Take 1 tablet by mouth daily.       docusate sodium (COLACE) 100 MG capsule Take 100 mg by mouth daily. Daily at  bedtime      doxycycline (VIBRA-TABS) 100 MG tablet TAKE ONE TABLET DAILY AS DIRECTED     doxycycline (VIBRA-TABS) 100 MG tablet Take 1 tablet (100 mg total) by mouth 2 (two) times daily. 20 tablet 0   estradiol (ESTRACE) 0.5 MG tablet Take 0.5 mg by mouth daily.     hydrocortisone (ANUSOL-HC) 2.5 % rectal cream Place 1 application rectally 2 (two) times daily. 30 g 0   lidocaine-prilocaine (EMLA) cream Apply 1 application topically as needed. 30 g 0   meloxicam (MOBIC) 15 MG tablet      NON FORMULARY Hydro eye     omeprazole (PRILOSEC) 20 MG capsule Take 1 capsule (20 mg total) by mouth daily. AS NEEDED 100 capsule 4   vitamin E 100 UNIT capsule Take 100 Units by mouth daily.       zolmitriptan (ZOMIG ZMT) 2.5 MG disintegrating tablet Take 1 tablet (2.5 mg total) by mouth as needed for migraine. 10 tablet 4   alendronate (FOSAMAX) 70 MG tablet Take 1 tablet (70 mg total) by mouth every 7 (seven) days. Take with a full glass of water on an empty stomach. (Patient not taking: Reported on 01/16/2020) 4 tablet 11   dicyclomine (BENTYL) 10 MG capsule Take 1 pill before each meal and every 6 hours as needed (Patient not taking: Reported on 01/16/2020) 90 capsule 1   No facility-administered medications prior to visit.    Allergies  Allergen Reactions   Codeine    Compazine    Gabapentin    Prednisone     REACTION: makes heart beat hard \\T \ fast   Prochlorperazine Edisylate    Sulfa Antibiotics    Sulfonamide Derivatives     Review of Systems  HENT: Negative.        Ears feeling clogged  Respiratory: Negative.   Cardiovascular: Negative.   Skin: Negative.   Neurological: Negative.   Psychiatric/Behavioral: Negative.   All other systems reviewed and are negative.      Objective:    Physical Exam Vitals and nursing note reviewed.  Constitutional:      Appearance: Normal appearance.  HENT:     Right Ear: There is impacted cerumen.     Left Ear: There is no  impacted cerumen.     Ears:     Comments: Right Ear lavage: informed consent obtained. Right ear flushed with warm water. curette used to remove the impaction. Patient tolerated the procedure well.  Mouth/Throat:     Mouth: Mucous membranes are dry.  Cardiovascular:     Rate and Rhythm: Normal rate and regular rhythm.  Pulmonary:     Effort: Pulmonary effort is normal.     Breath sounds: Normal breath sounds.  Abdominal:     General: Abdomen is flat.     Palpations: Abdomen is soft.  Musculoskeletal:     Cervical back: Normal range of motion and neck supple.  Skin:    General: Skin is warm and dry.  Neurological:     General: No focal deficit present.     Mental Status: She is alert and oriented to person, place, and time.  Psychiatric:        Mood and Affect: Mood normal.     BP 118/64    Pulse 71    Temp (!) 96.7 F (35.9 C) (Tympanic)    Ht 5\' 1"  (1.549 m)    Wt 116 lb (52.6 kg)    SpO2 97%    BMI 21.92 kg/m  Wt Readings from Last 3 Encounters:  01/16/20 116 lb (52.6 kg)  11/24/19 116 lb (52.6 kg)  10/05/19 112 lb (50.8 kg)    Health Maintenance Due  Topic Date Due   INFLUENZA VACCINE  09/10/2019    There are no preventive care reminders to display for this patient.   Lab Results  Component Value Date   TSH 1.50 02/24/2017   Lab Results  Component Value Date   WBC 7.7 07/20/2019   HGB 13.3 07/20/2019   HCT 40.0 07/20/2019   MCV 95.9 07/20/2019   PLT 282.0 07/20/2019   Lab Results  Component Value Date   NA 138 07/20/2019   K 4.2 07/20/2019   CO2 34 (H) 07/20/2019   GLUCOSE 104 (H) 07/20/2019   BUN 16 07/20/2019   CREATININE 0.68 07/20/2019   BILITOT 0.4 07/20/2019   ALKPHOS 65 07/20/2019   AST 19 07/20/2019   ALT 16 07/20/2019   PROT 7.9 07/20/2019   ALBUMIN 4.7 07/20/2019   CALCIUM 10.7 (H) 07/20/2019   ANIONGAP 8 02/22/2018   GFR 85.33 07/20/2019   Lab Results  Component Value Date   CHOL 178 03/09/2019   Lab Results  Component  Value Date   HDL 68.10 03/09/2019   Lab Results  Component Value Date   LDLCALC 90 03/09/2019   Lab Results  Component Value Date   TRIG 99.0 03/09/2019   Lab Results  Component Value Date   CHOLHDL 3 03/09/2019   No results found for: HGBA1C     Assessment & Plan:   Problem List Items Addressed This Visit    None    Visit Diagnoses    Impacted cerumen, right ear    -  Primary       No orders of the defined types were placed in this encounter.    Kennyth Arnold, FNP

## 2020-01-18 ENCOUNTER — Encounter: Payer: Self-pay | Admitting: Family Medicine

## 2020-01-24 ENCOUNTER — Ambulatory Visit: Payer: Medicare PPO | Admitting: Physical Therapy

## 2020-01-24 ENCOUNTER — Encounter: Payer: Self-pay | Admitting: Physical Therapy

## 2020-01-24 ENCOUNTER — Other Ambulatory Visit: Payer: Self-pay

## 2020-01-24 DIAGNOSIS — M542 Cervicalgia: Secondary | ICD-10-CM

## 2020-01-24 DIAGNOSIS — R252 Cramp and spasm: Secondary | ICD-10-CM | POA: Diagnosis not present

## 2020-01-24 NOTE — Therapy (Signed)
[address]. [place], [place], [address] Phone: [phone]   Fax:  [phone]  Physical Therapy Treatment  Patient Details  Name: [name] MRN: [id number] Date of Birth: 24-Oct-1950 Referring Provider (PT): Noemi Chapel   Encounter Date: 01/24/2020   PT End of Session - 01/24/20 1053    Visit Number 12    Number of Visits 16    Date for PT Re-Evaluation 03/10/20    Authorization Type [employer]    PT Start Time 7096    PT Stop Time 1057    PT Time Calculation (min) 42 min    Activity Tolerance Patient tolerated treatment well    Behavior During Therapy WFL for tasks assessed/performed           Past Medical History:  Diagnosis Date   Anal fissure    Anemia    during pregnancy   Anxiety    Basal cell carcinoma    DDD (degenerative disc disease)    Diverticulosis    Hypokalemia    LBP (low back pain)    Melanoma ([facility])    Migraine    MVP (mitral valve prolapse) 1990   psa   Rosacea    Ulcer    as a child    Past Surgical History:  Procedure Laterality Date   ABDOMINAL HYSTERECTOMY  1985   BREAST BIOPSY Left    secondary to infection   I & D EXTREMITY Right 09/09/2012   Procedure: Minor IRRIGATION AND DEBRIDEMENT EXTREMITY paronychia of right thumb;  Surgeon: Wynonia Sours, MD;  Location: [facility];  Service: Orthopedics;  Laterality: Right;   I & D EXTREMITY Left 01/26/2014   Procedure: MINOR IINCISION AND DRAINAGE paronychia left index finger;  Surgeon: Daryll Brod, MD;  Location: [facility];  Service: Orthopedics;  Laterality: Left;  left index   OOPHORECTOMY Left 1994   REPAIR KNEE LIGAMENT Left 1989   TONSILLECTOMY  1955   TUBAL LIGATION     VEIN SURGERY      There were no vitals filed for this visit.   Subjective Assessment - 01/24/20 1021    Subjective Patient reports that she got a cold and really has not done much, reports that the  neck is hurting more, "maybe from not doing much"    Currently in Pain? Yes    Pain Score 3     Pain Location Neck    Pain Orientation Right    Pain Descriptors / Indicators Sore;Tightness    Aggravating Factors  activity                             [facility] Adult PT Treatment/Exercise - 01/24/20 0001      Neck Exercises: Machines for Strengthening   Nustep level 5 x 6 minutes    Cybex Row 15# 3x10    Lat Pull 20# 3x10    Other Machines for Strengthening 10# straight arm pulls    Other Machines for Strengthening 10# biceps, 20# triceps 3x10 each      Neck Exercises: Theraband   Shoulder External Rotation Red;20 reps    Horizontal ABduction 20 reps;Red      Neck Exercises: Standing   Other Standing Exercises 2# w backs, 10# farmer carry , 8# overhead carry      Neck Exercises: Seated   Shoulder Shrugs 20 reps    Shoulder Shrugs Limitations 5# with  some upper trap and levator strethes    Other Seated Exercise 5# bent over row, 3# reverse flies and extensions    Other Seated Exercise isometric abs                    PT Short Term Goals - 12/26/19 1112      PT SHORT TERM GOAL #1   Title independent with initial HEP    Status Achieved             PT Long Term Goals - 01/24/20 1130      PT LONG TERM GOAL #1   Title decrease pain 50%    Status Partially Met      PT LONG TERM GOAL #2   Title increase cervical ROM 25%    Status Achieved      PT LONG TERM GOAL #3   Title understand posture and body mechanics    Status Partially Met                 Plan - 01/24/20 1055    Clinical Impression Statement i went over the exercises that she can do at home, she will be out the next trwo weeks for the holidays, reports stress with this.  She still requires cues to go slow and for form, she has small aches and pains and tightness iwth much of the exercises and needs encouragement to work through it or I calter to help her out.    PT Next  Visit Plan she is going to try HEP the next two weeks and see how she does, has some worries about stress during the holidays    Consulted and Agree with Plan of Care Patient           Patient will benefit from skilled therapeutic intervention in order to improve the following deficits and impairments:  Decreased range of motion,Impaired UE functional use,Increased muscle spasms,Pain,Improper body mechanics,Postural dysfunction,Decreased strength  Visit Diagnosis: Cervicalgia  Cramp and spasm     Problem List Patient Active Problem List   Diagnosis Date Noted   Insect bite of left foot 11/24/2019   Pustule 11/24/2019   Chronic pain of right thumb 03/09/2019   External hemorrhoids 03/03/2018   Other chest pain 02/23/2018   Insomnia 02/24/2017   Encounter for counseling 02/24/2017   Conjunctivitis 03/01/2014   Seborrheic keratosis, inflamed 01/05/2012   Postmenopausal disorder 12/22/2011   Skin cancer 07/14/2011   Migraine 05/14/2007   IRRITABLE BOWEL SYNDROME 05/14/2007   Allergic rhinitis 04/07/2007   DERMATITIS DUE TO SOLAR RADIATION 04/07/2007   Premature beat 09/20/2006   DIVERTICULOSIS, COLON 04/13/2002    Sumner Boast., PT 01/24/2020, 11:30 AM  [facility]. [place], [place], [address] Phone: [phone]   Fax:  [phone]  Name: HENNA DERDERIAN MRN: [id number] Date of Birth: [birth year]

## 2020-02-08 ENCOUNTER — Telehealth: Payer: Self-pay

## 2020-02-08 NOTE — Telephone Encounter (Signed)
Patient called to cancel her appointment next 06-17-22 because of a death in the family.  She said that Dr. Ulice Bold had mentioned that we were getting a new laser the first of next year.  She is wondering if we think she needs to go ahead and come in or wait for the new laser.  Please call.

## 2020-02-09 NOTE — Telephone Encounter (Signed)
All to pt- no answer- left v/m informing her that Dr. Ulice Bold continues to trial and investigate different lasers & no decision has been made as to which laser & when we will have the laser available. I instructed her to call for any questions and she will call back to reschedule her laser appointment with [place].

## 2020-02-13 ENCOUNTER — Encounter: Payer: Medicare PPO | Admitting: Plastic Surgery

## 2020-02-14 ENCOUNTER — Other Ambulatory Visit: Payer: Self-pay

## 2020-02-14 ENCOUNTER — Encounter: Payer: Self-pay | Admitting: Physical Therapy

## 2020-02-14 ENCOUNTER — Ambulatory Visit: Payer: Medicare PPO | Attending: Orthopedic Surgery | Admitting: Physical Therapy

## 2020-02-14 DIAGNOSIS — R252 Cramp and spasm: Secondary | ICD-10-CM | POA: Insufficient documentation

## 2020-02-14 DIAGNOSIS — M542 Cervicalgia: Secondary | ICD-10-CM | POA: Diagnosis not present

## 2020-02-14 NOTE — Therapy (Signed)
[place]. [place], [place], [address] Phone: [phone]   Fax:  [phone]  Physical Therapy Treatment  Patient Details  Name: [name] MRN: [id number] Date of Birth: March 13, 1950 Referring Provider (PT): Noemi Chapel   Encounter Date: 02/14/2020   PT End of Session - 02/14/20 1151    Visit Number 13    Date for PT Re-Evaluation 03/10/20    Authorization Type [employer]    PT Start Time 1015    PT Stop Time 1057    PT Time Calculation (min) 42 min    Activity Tolerance Patient tolerated treatment well    Behavior During Therapy [facility] for tasks assessed/performed           Past Medical History:  Diagnosis Date  . Anal fissure   . Anemia    during pregnancy  . Anxiety   . Basal cell carcinoma   . DDD (degenerative disc disease)   . Diverticulosis   . Hypokalemia   . LBP (low back pain)   . Melanoma ([place])   . Migraine   . MVP (mitral valve prolapse) 1990   psa  . Rosacea   . Ulcer    as a child    Past Surgical History:  Procedure Laterality Date  . ABDOMINAL HYSTERECTOMY  1985  . BREAST BIOPSY Left    secondary to infection  . I & D EXTREMITY Right 09/09/2012   Procedure: Minor IRRIGATION AND DEBRIDEMENT EXTREMITY paronychia of right thumb;  Surgeon: Wynonia Sours, MD;  Location: [place];  Service: Orthopedics;  Laterality: Right;  . I & D EXTREMITY Left 01/26/2014   Procedure: MINOR IINCISION AND DRAINAGE paronychia left index finger;  Surgeon: Daryll Brod, MD;  Location: [place];  Service: Orthopedics;  Laterality: Left;  left index  . OOPHORECTOMY Left 1994  . REPAIR KNEE LIGAMENT Left 1989  . TONSILLECTOMY  1955  . TUBAL LIGATION    . VEIN SURGERY      There were no vitals filed for this visit.   Subjective Assessment - 02/14/20 1022    Subjective Patient has been out busy with holidays.  She reports that she has been very busy with some stress.  She reports  that she was bending over and a large pack of TP feel off a shelf and hit her on the back of the head.  Has a little more pain and tightness.  Patient reports a trip and fall and landing on her stomach, she reports that her neck and wrists have been stiff and sore    Currently in Pain? Yes    Pain Score 2     Pain Location Neck    Aggravating Factors  stress                             [facility] Adult PT Treatment/Exercise - 02/14/20 0001      Neck Exercises: Machines for Strengthening   Nustep level 5 x 6 minutes    Cybex Row 15# 3x10    Lat Pull 20# 3x10    Other Machines for Strengthening 10# straight arm pulls      Neck Exercises: Seated   Shoulder Shrugs 20 reps    Shoulder Shrugs Limitations 5# with some upper trap and levator strethes    Other Seated Exercise 3# bent over row, 3# reverse flies and extensions    Other Seated  Exercise isometric abs                    PT Short Term Goals - 12/26/19 1112      PT SHORT TERM GOAL #1   Title independent with initial HEP    Status Achieved             PT Long Term Goals - 02/14/20 1204      PT LONG TERM GOAL #1   Title decrease pain 50%    Status Partially Met      PT LONG TERM GOAL #2   Title increase cervical ROM 25%    Status Partially Met                 Plan - 02/14/20 1151    Clinical Impression Statement Patient has been out due to being busy with the holidays, she also reports a fall onto her stomach about 2-3 weeks ago, she report sthat she was very sore and stiff for about a week after the fall.  She has limitations in her cervical ROM, she does report less pain but still feels weak, wants to be able to do gym on her own    PT Next Visit Plan work on her gym program and her safety with this and how to adjust machines    Consulted and Agree with Plan of Care Patient           Patient will benefit from skilled therapeutic intervention in order to improve the following  deficits and impairments:  Decreased range of motion,Impaired UE functional use,Increased muscle spasms,Pain,Improper body mechanics,Postural dysfunction,Decreased strength  Visit Diagnosis: Cervicalgia  Cramp and spasm     Problem List Patient Active Problem List   Diagnosis Date Noted  . Insect bite of left foot 11/24/2019  . Pustule 11/24/2019  . Chronic pain of right thumb 03/09/2019  . External hemorrhoids 03/03/2018  . Other chest pain 02/23/2018  . Insomnia 02/24/2017  . Encounter for counseling 02/24/2017  . Conjunctivitis 03/01/2014  . Seborrheic keratosis, inflamed 01/05/2012  . Postmenopausal disorder 12/22/2011  . Skin cancer 07/14/2011  . Migraine 05/14/2007  . IRRITABLE BOWEL SYNDROME 05/14/2007  . Allergic rhinitis 04/07/2007  . DERMATITIS DUE TO SOLAR RADIATION 04/07/2007  . Premature beat 09/20/2006  . DIVERTICULOSIS, COLON 04/13/2002    Sumner Boast., PT 02/14/2020, 12:05 PM  [place]. [place], [place], [address] Phone: [phone]   Fax:  [phone]  Name: LEXY MEININGER MRN: [id number] Date of Birth: 04/16/1950

## 2020-02-20 DIAGNOSIS — H11153 Pinguecula, bilateral: Secondary | ICD-10-CM | POA: Diagnosis not present

## 2020-02-20 DIAGNOSIS — H43811 Vitreous degeneration, right eye: Secondary | ICD-10-CM | POA: Diagnosis not present

## 2020-02-20 DIAGNOSIS — L719 Rosacea, unspecified: Secondary | ICD-10-CM | POA: Diagnosis not present

## 2020-02-20 DIAGNOSIS — H2513 Age-related nuclear cataract, bilateral: Secondary | ICD-10-CM | POA: Diagnosis not present

## 2020-02-21 ENCOUNTER — Encounter: Payer: Self-pay | Admitting: Physical Therapy

## 2020-02-21 ENCOUNTER — Ambulatory Visit: Payer: Medicare PPO | Admitting: Physical Therapy

## 2020-02-21 ENCOUNTER — Other Ambulatory Visit: Payer: Self-pay

## 2020-02-21 DIAGNOSIS — R252 Cramp and spasm: Secondary | ICD-10-CM

## 2020-02-21 DIAGNOSIS — M542 Cervicalgia: Secondary | ICD-10-CM

## 2020-02-21 NOTE — Therapy (Signed)
[place]. [place], [place], [address] Phone: [phone]   Fax:  [phone]  Physical Therapy Treatment  Patient Details  Name: [name] MRN: [id number] Date of Birth: [birth year] Referring Provider (PT): Noemi Chapel   Encounter Date: 02/21/2020   PT End of Session - 02/21/20 1053    Visit Number 14    Number of Visits 16    Date for PT Re-Evaluation 03/10/20    Authorization Type [employer]    PT Start Time 9628    PT Stop Time 1100    PT Time Calculation (min) 46 min    Activity Tolerance Patient tolerated treatment well    Behavior During Therapy [facility] for tasks assessed/performed           Past Medical History:  Diagnosis Date  . Anal fissure   . Anemia    during pregnancy  . Anxiety   . Basal cell carcinoma   . DDD (degenerative disc disease)   . Diverticulosis   . Hypokalemia   . LBP (low back pain)   . Melanoma ([place])   . Migraine   . MVP (mitral valve prolapse) 1990   psa  . Rosacea   . Ulcer    as a child    Past Surgical History:  Procedure Laterality Date  . ABDOMINAL HYSTERECTOMY  1985  . BREAST BIOPSY Left    secondary to infection  . I & D EXTREMITY Right 09/09/2012   Procedure: Minor IRRIGATION AND DEBRIDEMENT EXTREMITY paronychia of right thumb;  Surgeon: Wynonia Sours, MD;  Location: [place];  Service: Orthopedics;  Laterality: Right;  . I & D EXTREMITY Left 01/26/2014   Procedure: MINOR IINCISION AND DRAINAGE paronychia left index finger;  Surgeon: Daryll Brod, MD;  Location: [place];  Service: Orthopedics;  Laterality: Left;  left index  . OOPHORECTOMY Left 1994  . REPAIR KNEE LIGAMENT Left 1989  . TONSILLECTOMY  1955  . TUBAL LIGATION    . VEIN SURGERY      There were no vitals filed for this visit.   Subjective Assessment - 02/21/20 1016    Subjective PAtient reports that she is a little sore but trying to do more at home and trying to  do some exercise    Currently in Pain? Yes    Pain Score 3     Pain Location Neck    Pain Orientation Right    Pain Descriptors / Indicators Tightness;Spasm    Aggravating Factors  stress                             [facility] Adult PT Treatment/Exercise - 02/21/20 0001      Neck Exercises: Machines for Strengthening   Nustep level 5 x 6 minutes    Cybex Row 20# 3x10    Lat Pull 20# 3x10    Other Machines for Strengthening 10# straight arm pulls    Other Machines for Strengthening 10# biceps, 20# triceps 3x10 each      Manual Therapy   Manual Therapy Soft tissue mobilization;Manual Traction    Soft tissue mobilization to bilateral upper traps and into the cervical paraspinals                    PT Short Term Goals - 12/26/19 1112      PT SHORT TERM GOAL #1   Title independent with  initial HEP    Status Achieved             PT Long Term Goals - 02/21/20 1100      PT LONG TERM GOAL #1   Title decrease pain 50%    Status Partially Met      PT LONG TERM GOAL #2   Title increase cervical ROM 25%    Status Partially Met      PT LONG TERM GOAL #3   Title understand posture and body mechanics    Status Achieved      PT LONG TERM GOAL #4   Title report no difficulty sleeping    Status Achieved                 Plan - 02/21/20 1059    Clinical Impression Statement Patient is tight and tender and sore in the upper traps, the rhomboids and the neck, she was tighter today, but reports that she has stress with her dad and his caregivers.  We are starting to work on what she can do after PT with the HEP / gym activities    PT Next Visit Plan work on her gym program and her safety with this and how to adjust machines    Consulted and Agree with Plan of Care Patient           Patient will benefit from skilled therapeutic intervention in order to improve the following deficits and impairments:  Decreased range of motion,Impaired UE  functional use,Increased muscle spasms,Pain,Improper body mechanics,Postural dysfunction,Decreased strength  Visit Diagnosis: Cervicalgia  Cramp and spasm     Problem List Patient Active Problem List   Diagnosis Date Noted  . Insect bite of left foot 11/24/2019  . Pustule 11/24/2019  . Chronic pain of right thumb 03/09/2019  . External hemorrhoids 03/03/2018  . Other chest pain 02/23/2018  . Insomnia 02/24/2017  . Encounter for counseling 02/24/2017  . Conjunctivitis 03/01/2014  . Seborrheic keratosis, inflamed 01/05/2012  . Postmenopausal disorder 12/22/2011  . Skin cancer 07/14/2011  . Migraine 05/14/2007  . IRRITABLE BOWEL SYNDROME 05/14/2007  . Allergic rhinitis 04/07/2007  . DERMATITIS DUE TO SOLAR RADIATION 04/07/2007  . Premature beat 09/20/2006  . DIVERTICULOSIS, COLON 04/13/2002    Sumner Boast., PT 02/21/2020, 11:01 AM  [place]. [place], [place], [address] Phone: [phone]   Fax:  [phone]  Name: [name] MRN: [id number] Date of Birth: 16-Apr-1950

## 2020-02-28 ENCOUNTER — Telehealth: Payer: Self-pay

## 2020-02-28 ENCOUNTER — Encounter: Payer: Self-pay | Admitting: Physical Therapy

## 2020-02-28 ENCOUNTER — Ambulatory Visit: Payer: Medicare PPO | Admitting: Physical Therapy

## 2020-02-28 ENCOUNTER — Other Ambulatory Visit: Payer: Self-pay

## 2020-02-28 DIAGNOSIS — M542 Cervicalgia: Secondary | ICD-10-CM | POA: Diagnosis not present

## 2020-02-28 DIAGNOSIS — R252 Cramp and spasm: Secondary | ICD-10-CM | POA: Diagnosis not present

## 2020-02-28 NOTE — Therapy (Signed)
[place]. [place], [place], [address] Phone: [phone]   Fax:  [phone]  Physical Therapy Treatment  Patient Details  Name: [name] MRN: [id number] Date of Birth: 08/13/1950 Referring Provider (PT): Noemi Chapel   Encounter Date: 02/28/2020   PT End of Session - 02/28/20 1113    Visit Number 15    Number of Visits 16    Date for PT Re-Evaluation 03/10/20    Authorization Type [employer]    PT Start Time 8250    PT Stop Time 1113    PT Time Calculation (min) 59 min    Activity Tolerance Patient tolerated treatment well    Behavior During Therapy [facility] for tasks assessed/performed           Past Medical History:  Diagnosis Date  . Anal fissure   . Anemia    during pregnancy  . Anxiety   . Basal cell carcinoma   . DDD (degenerative disc disease)   . Diverticulosis   . Hypokalemia   . LBP (low back pain)   . Melanoma ([place])   . Migraine   . MVP (mitral valve prolapse) 1990   psa  . Rosacea   . Ulcer    as a child    Past Surgical History:  Procedure Laterality Date  . ABDOMINAL HYSTERECTOMY  1985  . BREAST BIOPSY Left    secondary to infection  . I & D EXTREMITY Right 09/09/2012   Procedure: Minor IRRIGATION AND DEBRIDEMENT EXTREMITY paronychia of right thumb;  Surgeon: Wynonia Sours, MD;  Location: [place];  Service: Orthopedics;  Laterality: Right;  . I & D EXTREMITY Left 01/26/2014   Procedure: MINOR IINCISION AND DRAINAGE paronychia left index finger;  Surgeon: Daryll Brod, MD;  Location: [place];  Service: Orthopedics;  Laterality: Left;  left index  . OOPHORECTOMY Left 1994  . REPAIR KNEE LIGAMENT Left 1989  . TONSILLECTOMY  1955  . TUBAL LIGATION    . VEIN SURGERY      There were no vitals filed for this visit.   Subjective Assessment - 02/28/20 1015    Subjective I have continued to hvae some pain in the right neck area, again I think it is stress     Currently in Pain? Yes    Pain Score 3     Pain Location Neck    Pain Orientation Right    Pain Descriptors / Indicators Aching;Sore;Spasm;Tightness    Aggravating Factors  stress                             [facility] Adult PT Treatment/Exercise - 02/28/20 0001      Neck Exercises: Machines for Strengthening   UBE (Upper Arm Bike) level 2 x 5 minutes    Nustep level 5 x 6 minutes      Neck Exercises: Theraband   Shoulder Extension 20 reps;Green    Rows 20 reps;Green    Shoulder External Rotation Red;20 reps    Horizontal ABduction 20 reps;Red      Neck Exercises: Seated   Other Seated Exercise 3# bent over row, 3# reverse flies and extensions      Moist Heat Therapy   Moist Heat Location Cervical      Electrical Stimulation   Electrical Stimulation Location cervical trap area    Electrical Stimulation Action IFC    Electrical Stimulation Parameters sitting  Electrical Stimulation Goals Pain      Manual Therapy   Manual Therapy Soft tissue mobilization;Manual Traction    Soft tissue mobilization to bilateral upper traps and into the cervical paraspinals                    PT Short Term Goals - 12/26/19 1112      PT SHORT TERM GOAL #1   Title independent with initial HEP    Status Achieved             PT Long Term Goals - 02/28/20 1154      PT LONG TERM GOAL #1   Title decrease pain 50%    Status Partially Met      PT LONG TERM GOAL #2   Title increase cervical ROM 25%    Status Achieved                 Plan - 02/28/20 1113    Clinical Impression Statement I focused a lot on todays session with her trying to replicate for home HEP.  She needs a lot of verbal and tactile cues to do correctly, I will need to giver her pictures with the exercises next week prior to d/c    PT Next Visit Plan work on her gym program and her safety with this and how to adjust machines    Consulted and Agree with Plan of Care Patient            Patient will benefit from skilled therapeutic intervention in order to improve the following deficits and impairments:  Decreased range of motion,Impaired UE functional use,Increased muscle spasms,Pain,Improper body mechanics,Postural dysfunction,Decreased strength  Visit Diagnosis: Cervicalgia  Cramp and spasm     Problem List Patient Active Problem List   Diagnosis Date Noted  . Insect bite of left foot 11/24/2019  . Pustule 11/24/2019  . Chronic pain of right thumb 03/09/2019  . External hemorrhoids 03/03/2018  . Other chest pain 02/23/2018  . Insomnia 02/24/2017  . Encounter for counseling 02/24/2017  . Conjunctivitis 03/01/2014  . Seborrheic keratosis, inflamed 01/05/2012  . Postmenopausal disorder 12/22/2011  . Skin cancer 07/14/2011  . Migraine 05/14/2007  . IRRITABLE BOWEL SYNDROME 05/14/2007  . Allergic rhinitis 04/07/2007  . DERMATITIS DUE TO SOLAR RADIATION 04/07/2007  . Premature beat 09/20/2006  . DIVERTICULOSIS, COLON 04/13/2002    Sumner Boast., PT 02/28/2020, 11:55 AM  [place]. [place], [place], [address] Phone: [phone]   Fax:  [phone]  Name: TERSA FOTOPOULOS MRN: [id number] Date of Birth: 12-05-1950

## 2020-02-28 NOTE — Telephone Encounter (Signed)
Made in error

## 2020-03-05 ENCOUNTER — Telehealth: Payer: Self-pay

## 2020-03-05 ENCOUNTER — Ambulatory Visit: Payer: Medicare PPO | Admitting: Physical Therapy

## 2020-03-05 ENCOUNTER — Encounter: Payer: Self-pay | Admitting: Physical Therapy

## 2020-03-05 ENCOUNTER — Other Ambulatory Visit: Payer: Self-pay

## 2020-03-05 DIAGNOSIS — I87392 Chronic venous hypertension (idiopathic) with other complications of left lower extremity: Secondary | ICD-10-CM | POA: Diagnosis not present

## 2020-03-05 DIAGNOSIS — R252 Cramp and spasm: Secondary | ICD-10-CM | POA: Diagnosis not present

## 2020-03-05 DIAGNOSIS — M79605 Pain in left leg: Secondary | ICD-10-CM | POA: Diagnosis not present

## 2020-03-05 DIAGNOSIS — M542 Cervicalgia: Secondary | ICD-10-CM

## 2020-03-05 NOTE — Therapy (Signed)
[place]. [place], [place], [address] Phone: [phone]   Fax:  [phone]  Physical Therapy Treatment  Patient Details  Name: [name] MRN: [id number] Date of Birth: Nov 08, 1950 Referring Provider (PT): Noemi Chapel   Encounter Date: 03/05/2020   PT End of Session - 03/05/20 1113    Visit Number 16    Number of Visits 16    Date for PT Re-Evaluation 03/10/20    Authorization Type [employer]    PT Start Time 0926    PT Stop Time 1010    PT Time Calculation (min) 44 min    Activity Tolerance Patient tolerated treatment well    Behavior During Therapy [facility] for tasks assessed/performed           Past Medical History:  Diagnosis Date  . Anal fissure   . Anemia    during pregnancy  . Anxiety   . Basal cell carcinoma   . DDD (degenerative disc disease)   . Diverticulosis   . Hypokalemia   . LBP (low back pain)   . Melanoma ([place])   . Migraine   . MVP (mitral valve prolapse) 1990   psa  . Rosacea   . Ulcer    as a child    Past Surgical History:  Procedure Laterality Date  . ABDOMINAL HYSTERECTOMY  1985  . BREAST BIOPSY Left    secondary to infection  . I & D EXTREMITY Right 09/09/2012   Procedure: Minor IRRIGATION AND DEBRIDEMENT EXTREMITY paronychia of right thumb;  Surgeon: Wynonia Sours, MD;  Location: [place];  Service: Orthopedics;  Laterality: Right;  . I & D EXTREMITY Left 01/26/2014   Procedure: MINOR IINCISION AND DRAINAGE paronychia left index finger;  Surgeon: Daryll Brod, MD;  Location: [place];  Service: Orthopedics;  Laterality: Left;  left index  . OOPHORECTOMY Left 1994  . REPAIR KNEE LIGAMENT Left 1989  . TONSILLECTOMY  1955  . TUBAL LIGATION    . VEIN SURGERY      There were no vitals filed for this visit.   Subjective Assessment - 03/05/20 0930    Subjective I have a lot of questions, with the HEP    Currently in Pain? Yes    Pain Score 1      Pain Location Neck    Pain Relieving Factors exercises have really made me stronger                             [facility] Adult PT Treatment/Exercise - 03/05/20 0001      Neck Exercises: Machines for Strengthening   Nustep level 5 x 6 minutes    Cybex Row 20# 3x10    Lat Pull 20# 3x10      Neck Exercises: Standing   Other Standing Exercises 2# w backs, 10# farmer carry , 8# overhead carry      Neck Exercises: Seated   Other Seated Exercise 3# bent over row, 3# reverse flies and extensions    Other Seated Exercise isometric abs, sit to stand holding 6#                    PT Short Term Goals - 12/26/19 1112      PT SHORT TERM GOAL #1   Title independent with initial HEP    Status Achieved  PT Long Term Goals - 03/05/20 1116      PT LONG TERM GOAL #1   Title decrease pain 50%    Status Achieved      PT LONG TERM GOAL #2   Title increase cervical ROM 25%    Status Achieved      PT LONG TERM GOAL #3   Title understand posture and body mechanics    Status Achieved      PT LONG TERM GOAL #4   Title report no difficulty sleeping    Status Achieved                 Plan - 03/05/20 1114    Clinical Impression Statement I gave a large handout packet for all of the HEP exercises and gave her multiple ways to do, whether with machines, tband or hand weights.  She had a lot of questions and has some difficulty with remembering the exercises but gave pictures for her to follow, gave her the link to her HEP that she can acess in the future.  She felt like she understood how to continue to progress as she can tolerate, I did caution her on not over doing it.    PT Next Visit Plan D/C with goals met and her doing advanced HEP    Consulted and Agree with Plan of Care Patient           Patient will benefit from skilled therapeutic intervention in order to improve the following deficits and impairments:  Decreased range of  motion,Impaired UE functional use,Increased muscle spasms,Pain,Improper body mechanics,Postural dysfunction,Decreased strength  Visit Diagnosis: Cervicalgia  Cramp and spasm     Problem List Patient Active Problem List   Diagnosis Date Noted  . Insect bite of left foot 11/24/2019  . Pustule 11/24/2019  . Chronic pain of right thumb 03/09/2019  . External hemorrhoids 03/03/2018  . Other chest pain 02/23/2018  . Insomnia 02/24/2017  . Encounter for counseling 02/24/2017  . Conjunctivitis 03/01/2014  . Seborrheic keratosis, inflamed 01/05/2012  . Postmenopausal disorder 12/22/2011  . Skin cancer 07/14/2011  . Migraine 05/14/2007  . IRRITABLE BOWEL SYNDROME 05/14/2007  . Allergic rhinitis 04/07/2007  . DERMATITIS DUE TO SOLAR RADIATION 04/07/2007  . Premature beat 09/20/2006  . DIVERTICULOSIS, COLON 04/13/2002    Sumner Boast., PT 03/05/2020, 11:16 AM  [place]. [place], [place], [address] Phone: [phone]   Fax:  [phone]  Name: ORIEL OJO MRN: [id number] Date of Birth: [birth year]

## 2020-03-05 NOTE — Patient Instructions (Signed)
Access Code: [email or web address] URL: [email or web address] Date: 03/05/2020 Prepared by: Lum Babe  Exercises Seated Shoulder Row with Anchored Resistance - 1 x daily - 7 x weekly - 3 sets - 10 reps - 3 hold Standing Bent Over Single Arm Scapular Row with Table Support - 1 x daily - 7 x weekly - 3 sets - 10 reps - 3 hold Single Arm Shoulder Extension with Anchored Resistance - 1 x daily - 7 x weekly - 3 sets - 10 reps - 3 hold Seated Shoulder Extension with Dumbbells - 1 x daily - 7 x weekly - 3 sets - 10 reps - 3 hold Shoulder Overhead Press in Abduction with Dumbbells - 1 x daily - 7 x weekly - 3 sets - 10 reps - 3 hold Standing Eccentric Bicep Curl Pronated then Supinated - 1 x daily - 7 x weekly - 3 sets - 10 reps - 3 hold Standing Bicep Curls with Resistance - 1 x daily - 7 x weekly - 3 sets - 10 reps - 3 hold Standing Bent Over on Chair Single Arm Tricep Extension with Dumbbell - 1 x daily - 7 x weekly - 3 sets - 10 reps - 3 hold Standing Elbow Extension with Self-Anchored Resistance - 1 x daily - 7 x weekly - 3 sets - 10 reps - 3 hold Supine Hip and Knee Flexion AROM with Swiss Ball - 1 x daily - 7 x weekly - 3 sets - 10 reps - 3 hold Supine Lower Trunk Rotation with Swiss Ball - 1 x daily - 7 x weekly - 3 sets - 10 reps - 3 hold Supine Bridge with Pelvic Floor Contraction on Swiss Ball - 1 x daily - 7 x weekly - 3 sets - 10 reps - 3 hold

## 2020-03-05 NOTE — Telephone Encounter (Signed)
Patient called to find out if we do laser for leg veins. She wanted more information on a face peel and to learn more about the laser we offer to help with leg veins.

## 2020-03-06 ENCOUNTER — Ambulatory Visit: Payer: Medicare PPO | Admitting: Physical Therapy

## 2020-03-12 ENCOUNTER — Telehealth: Payer: Self-pay

## 2020-03-12 NOTE — Telephone Encounter (Signed)
Per Maudie Mercury: pt has "sclerotherapy" scheduled for her bilateral leg veins & she will contact us if a f/u treatment is needed in the future.

## 2020-04-03 DIAGNOSIS — H43811 Vitreous degeneration, right eye: Secondary | ICD-10-CM | POA: Diagnosis not present

## 2020-04-03 DIAGNOSIS — L719 Rosacea, unspecified: Secondary | ICD-10-CM | POA: Diagnosis not present

## 2020-04-03 DIAGNOSIS — H0288B Meibomian gland dysfunction left eye, upper and lower eyelids: Secondary | ICD-10-CM | POA: Diagnosis not present

## 2020-04-03 DIAGNOSIS — H0288A Meibomian gland dysfunction right eye, upper and lower eyelids: Secondary | ICD-10-CM | POA: Diagnosis not present

## 2020-04-12 ENCOUNTER — Encounter: Payer: Medicare PPO | Admitting: Family Medicine

## 2020-04-15 ENCOUNTER — Ambulatory Visit (INDEPENDENT_AMBULATORY_CARE_PROVIDER_SITE_OTHER): Payer: Medicare PPO | Admitting: Family Medicine

## 2020-04-15 ENCOUNTER — Encounter: Payer: Self-pay | Admitting: Family Medicine

## 2020-04-15 ENCOUNTER — Other Ambulatory Visit: Payer: Self-pay

## 2020-04-15 VITALS — BP 124/70 | HR 70 | Temp 97.6°F | Ht 61.0 in | Wt 116.0 lb

## 2020-04-15 DIAGNOSIS — M722 Plantar fascial fibromatosis: Secondary | ICD-10-CM | POA: Diagnosis not present

## 2020-04-15 DIAGNOSIS — R002 Palpitations: Secondary | ICD-10-CM

## 2020-04-15 DIAGNOSIS — E559 Vitamin D deficiency, unspecified: Secondary | ICD-10-CM | POA: Diagnosis not present

## 2020-04-15 DIAGNOSIS — K219 Gastro-esophageal reflux disease without esophagitis: Secondary | ICD-10-CM | POA: Diagnosis not present

## 2020-04-15 DIAGNOSIS — Z0000 Encounter for general adult medical examination without abnormal findings: Secondary | ICD-10-CM

## 2020-04-15 DIAGNOSIS — F341 Dysthymic disorder: Secondary | ICD-10-CM

## 2020-04-15 DIAGNOSIS — Z8679 Personal history of other diseases of the circulatory system: Secondary | ICD-10-CM | POA: Diagnosis not present

## 2020-04-15 LAB — COMPREHENSIVE METABOLIC PANEL
ALT: 13 U/L (ref 0–35)
AST: 17 U/L (ref 0–37)
Albumin: 4.3 g/dL (ref 3.5–5.2)
Alkaline Phosphatase: 58 U/L (ref 39–117)
BUN: 15 mg/dL (ref 6–23)
CO2: 31 mEq/L (ref 19–32)
Calcium: 10.1 mg/dL (ref 8.4–10.5)
Chloride: 102 mEq/L (ref 96–112)
Creatinine, Ser: 0.69 mg/dL (ref 0.40–1.20)
GFR: 87.22 mL/min (ref >60.00)
Glucose, Bld: 105 mg/dL — ABNORMAL HIGH (ref 70–99)
Potassium: 4.5 mEq/L (ref 3.5–5.1)
Sodium: 140 mEq/L (ref 135–145)
Total Bilirubin: 0.5 mg/dL (ref 0.2–1.2)
Total Protein: 7.1 g/dL (ref 6.0–8.3)

## 2020-04-15 LAB — TSH: TSH: 1.14 u[IU]/mL (ref 0.35–4.50)

## 2020-04-15 LAB — URINALYSIS, ROUTINE W REFLEX MICROSCOPIC
Bilirubin Urine: NEGATIVE
Hgb urine dipstick: NEGATIVE
Ketones, ur: NEGATIVE
Leukocytes,Ua: NEGATIVE
Nitrite: NEGATIVE
RBC / HPF: NONE SEEN (ref 0–?)
Specific Gravity, Urine: <=1.005 — AB (ref 1.000–1.030)
Total Protein, Urine: NEGATIVE
Urine Glucose: NEGATIVE
Urobilinogen, UA: 0.2 (ref 0.0–1.0)
pH: 7 (ref 5.0–8.0)

## 2020-04-15 LAB — CBC
HCT: 40 % (ref 36.0–46.0)
Hemoglobin: 13.6 g/dL (ref 12.0–15.0)
MCHC: 34 g/dL (ref 30.0–36.0)
MCV: 93.9 fl (ref 78.0–100.0)
Platelets: 271 10*3/uL (ref 150.0–400.0)
RBC: 4.27 Mil/uL (ref 3.87–5.11)
RDW: 12.8 % (ref 11.5–15.5)
WBC: 9.4 10*3/uL (ref 4.0–10.5)

## 2020-04-15 LAB — LIPID PANEL
Cholesterol: 183 mg/dL (ref 0–200)
HDL: 74.9 mg/dL (ref >39.00)
LDL Cholesterol: 80 mg/dL (ref 0–99)
NonHDL: 108.13
Total CHOL/HDL Ratio: 2
Triglycerides: 139 mg/dL (ref 0.0–149.0)
VLDL: 27.8 mg/dL (ref 0.0–40.0)

## 2020-04-15 LAB — VITAMIN D 25 HYDROXY (VIT D DEFICIENCY, FRACTURES): VITD: 37.03 ng/mL (ref 30.00–100.00)

## 2020-04-15 NOTE — Progress Notes (Signed)
Established Patient Office Visit  Subjective:  Patient ID: [name], female    DOB: 1950/10/29  Age: 70 y.o. MRN: [id number]  CC:  Chief Complaint  Patient presents with  . Annual Exam    CPE, would like right ear check, head aches come and go, sometimes will have chest pains. Pain in bottom of feet.     HPI [name] presents for with multiple issues and concerns.  She tells of occasional reflux.  That may be associated with her doxycycline chronically used.  She takes Pepcid as needed for this.  She uses meloxicam as needed for arthritis in her hands.  She has pains in the bottoms of her feet especially after sitting for a while.  She experiences palpitations and denies associated chest pains or shortness of breath.  She has a history of mitral valve prolapse and is interested in seeing cardiology for follow-up.  She is sad.  She is super stressed out of concern for her elderly father.  She is adamant about not taking the medicine for stress or depression and would like to try counseling.  Past Medical History:  Diagnosis Date  . Anal fissure   . Anemia    during pregnancy  . Anxiety   . Basal cell carcinoma   . DDD (degenerative disc disease)   . Diverticulosis   . Hypokalemia   . LBP (low back pain)   . Melanoma ([place])   . Migraine   . MVP (mitral valve prolapse) 1990   psa  . Rosacea   . Ulcer    as a child    Past Surgical History:  Procedure Laterality Date  . ABDOMINAL HYSTERECTOMY  1985  . BREAST BIOPSY Left    secondary to infection  . I & D EXTREMITY Right 09/09/2012   Procedure: Minor IRRIGATION AND DEBRIDEMENT EXTREMITY paronychia of right thumb;  Surgeon: Wynonia Sours, MD;  Location: [facility];  Service: Orthopedics;  Laterality: Right;  . I & D EXTREMITY Left 01/26/2014   Procedure: MINOR IINCISION AND DRAINAGE paronychia left index finger;  Surgeon: Daryll Brod, MD;  Location: [facility];  Service: Orthopedics;   Laterality: Left;  left index  . OOPHORECTOMY Left 1994  . REPAIR KNEE LIGAMENT Left 1989  . TONSILLECTOMY  1955  . TUBAL LIGATION    . VEIN SURGERY      Family History  Problem Relation Age of Onset  . Heart disease Mother   . Colon polyps Mother   . Heart disease Father   . Colitis Father   . Colon polyps Father   . Esophageal cancer Maternal Aunt   . Colon cancer Neg Hx     Social History   Socioeconomic History  . Marital status: Married    Spouse name: Not on file  . Number of children: 2  . Years of education: Not on file  . Highest education level: Not on file  Occupational History  . Occupation: retired Conservation officer, historic buildings  Tobacco Use  . Smoking status: Never Smoker  . Smokeless tobacco: Never Used  Vaping Use  . Vaping Use: Never used  Substance and Sexual Activity  . Alcohol use: Yes    Alcohol/week: 1.0 standard drink    Types: 1 Standard drinks or equivalent per week    Comment: social wine  . Drug use: No  . Sexual activity: Yes  Other Topics Concern  . Not on file  Social History Narrative  .  Not on file   Social Determinants of Health   Financial Resource Strain: Low Risk   . Difficulty of Paying Living Expenses: Not hard at all  Food Insecurity: No Food Insecurity  . Worried About Charity fundraiser in the Last Year: Never true  . Ran Out of Food in the Last Year: Never true  Transportation Needs: No Transportation Needs  . Lack of Transportation (Medical): No  . Lack of Transportation (Non-Medical): No  Physical Activity: Sufficiently Active  . Days of Exercise per Week: 4 days  . Minutes of Exercise per Session: 40 min  Stress: No Stress Concern Present  . Feeling of Stress : Only a little  Social Connections: Socially Integrated  . Frequency of Communication with Friends and Family: More than three times a week  . Frequency of Social Gatherings with Friends and Family: Once a week  . Attends Religious Services: More than 4 times per year   . Active Member of Clubs or Organizations: Yes  . Attends Archivist Meetings: More than 4 times per year  . Marital Status: Married  Human resources officer Violence: Not At Risk  . Fear of Current or Ex-Partner: No  . Emotionally Abused: No  . Physically Abused: No  . Sexually Abused: No    Outpatient Medications Prior to Visit  Medication Sig Dispense Refill  . aspirin 81 MG tablet Take 81 mg by mouth. ON OCCASION    . atenolol (TENORMIN) 25 MG tablet One half tablet when necessary for palpitations 30 tablet 4  . calcium-vitamin D 250-100 MG-UNIT per tablet Take 1 tablet by mouth daily.    Marland Kitchen docusate sodium (COLACE) 100 MG capsule Take 100 mg by mouth daily. Daily at bedtime     . doxycycline (VIBRA-TABS) 100 MG tablet TAKE ONE TABLET DAILY AS DIRECTED    . doxycycline (VIBRA-TABS) 100 MG tablet Take 1 tablet (100 mg total) by mouth 2 (two) times daily. 20 tablet 0  . estradiol (ESTRACE) 0.5 MG tablet Take 0.5 mg by mouth daily.    . hydrocortisone (ANUSOL-HC) 2.5 % rectal cream Place 1 application rectally 2 (two) times daily. 30 g 0  . lidocaine-prilocaine (EMLA) cream Apply 1 application topically as needed. 30 g 0  . NON FORMULARY Hydro eye    . vitamin E 100 UNIT capsule Take 100 Units by mouth daily.    Marland Kitchen zolmitriptan (ZOMIG ZMT) 2.5 MG disintegrating tablet Take 1 tablet (2.5 mg total) by mouth as needed for migraine. 10 tablet 4  . meloxicam (MOBIC) 15 MG tablet  (Patient not taking: Reported on 04/15/2020)    . omeprazole (PRILOSEC) 20 MG capsule Take 1 capsule (20 mg total) by mouth daily. AS NEEDED (Patient not taking: Reported on 04/15/2020) 100 capsule 4   No facility-administered medications prior to visit.    Allergies  Allergen Reactions  . Codeine   . Compazine   . Gabapentin   . Prednisone     REACTION: makes heart beat hard \\T \ fast  . Prochlorperazine Edisylate   . Sulfa Antibiotics   . Sulfonamide Derivatives     ROS Review of Systems   Constitutional: Negative.   HENT: Negative.   Eyes: Negative for photophobia and visual disturbance.  Respiratory: Negative.   Cardiovascular: Negative.   Gastrointestinal: Negative for abdominal pain, anal bleeding and blood in stool.  Endocrine: Negative for polyphagia and polyuria.  Genitourinary: Negative.   Musculoskeletal: Positive for arthralgias.  Allergic/Immunologic: Negative for immunocompromised state.  Neurological:  Negative for light-headedness.  Hematological: Does not bruise/bleed easily.  Psychiatric/Behavioral: Positive for dysphoric mood. Negative for self-injury and suicidal ideas.      Objective:    Physical Exam Vitals and nursing note reviewed.  Constitutional:      General: She is not in acute distress.    Appearance: Normal appearance. She is normal weight. She is not ill-appearing, toxic-appearing or diaphoretic.  HENT:     Head: Normocephalic and atraumatic.     Right Ear: Tympanic membrane, ear canal and external ear normal.     Left Ear: Tympanic membrane, ear canal and external ear normal.     Mouth/Throat:     Mouth: Mucous membranes are moist.  Eyes:     General: No scleral icterus.       Right eye: No discharge.        Left eye: No discharge.     Extraocular Movements: Extraocular movements intact.     Conjunctiva/sclera: Conjunctivae normal.     Pupils: Pupils are equal, round, and reactive to light.  Neck:     Vascular: No carotid bruit.  Cardiovascular:     Rate and Rhythm: Normal rate and regular rhythm.  Pulmonary:     Effort: Pulmonary effort is normal.     Breath sounds: Normal breath sounds.  Abdominal:     General: Bowel sounds are normal. There is no distension.     Palpations: There is no mass.     Tenderness: There is no abdominal tenderness.     Hernia: No hernia is present.  Musculoskeletal:     Cervical back: No rigidity or tenderness.     Right lower leg: No edema.     Left lower leg: No edema.  Lymphadenopathy:      Cervical: No cervical adenopathy.  Skin:    General: Skin is warm and dry.  Neurological:     Mental Status: She is alert and oriented to person, place, and time.  Psychiatric:        Mood and Affect: Mood normal.        Behavior: Behavior normal.     BP 124/70   Pulse 70   Temp 97.6 F (36.4 C) (Temporal)   Ht 5\' 1"  (1.549 m)   Wt 116 lb (52.6 kg)   SpO2 98%   BMI 21.92 kg/m  Wt Readings from Last 3 Encounters:  04/15/20 116 lb (52.6 kg)  01/16/20 116 lb (52.6 kg)  11/24/19 116 lb (52.6 kg)     There are no preventive care reminders to display for this patient.  There are no preventive care reminders to display for this patient.  Lab Results  Component Value Date   TSH 1.50 02/24/2017   Lab Results  Component Value Date   WBC 7.7 07/20/2019   HGB 13.3 07/20/2019   HCT 40.0 07/20/2019   MCV 95.9 07/20/2019   PLT 282.0 07/20/2019   Lab Results  Component Value Date   NA 138 07/20/2019   K 4.2 07/20/2019   CO2 34 (H) 07/20/2019   GLUCOSE 104 (H) 07/20/2019   BUN 16 07/20/2019   CREATININE 0.7 12/28/2019   BILITOT 0.4 07/20/2019   ALKPHOS 65 07/20/2019   AST 19 07/20/2019   ALT 16 07/20/2019   PROT 7.9 07/20/2019   ALBUMIN 4.7 07/20/2019   CALCIUM 10.7 (H) 07/20/2019   ANIONGAP 8 02/22/2018   GFR 85.33 07/20/2019   Lab Results  Component Value Date   CHOL 178 03/09/2019  Lab Results  Component Value Date   HDL 68.10 03/09/2019   Lab Results  Component Value Date   LDLCALC 90 03/09/2019   Lab Results  Component Value Date   TRIG 99.0 03/09/2019   Lab Results  Component Value Date   CHOLHDL 3 03/09/2019   No results found for: HGBA1C    Assessment & Plan:   Problem List Items Addressed This Visit      Digestive   Gastroesophageal reflux disease     Musculoskeletal and Integument   Plantar fasciitis     Other   History of mitral valve prolapse   Relevant Orders   Ambulatory referral to Cardiology   Healthcare maintenance    Relevant Orders   Comprehensive metabolic panel   Lipid panel   Urinalysis, Routine w reflex microscopic   Palpitations - Primary   Relevant Orders   CBC   TSH   Ambulatory referral to Cardiology   Dysthymia   Relevant Orders   Ambulatory referral to Psychology   Vitamin D deficiency   Relevant Orders   VITAMIN D 25 Hydroxy (Vit-D Deficiency, Fractures)      No orders of the defined types were placed in this encounter.   Follow-up: Return in about 3 months (around 07/16/2020).   Referred for cardiac consultation regarding her MVP and palpitations.  Suggested that she try Prilosec daily instead of Pepcid.  Will refer for talking therapy with psychology.  Information was given on health maintenance and plantar fasciitis. Libby Maw, MD

## 2020-04-15 NOTE — Patient Instructions (Signed)
Health Maintenance After Age 72 After age 64, you are at a higher risk for certain long-term diseases and infections as well as injuries from falls. Falls are a major cause of broken bones and head injuries in people who are older than age 41. Getting regular preventive care can help to keep you healthy and well. Preventive care includes getting regular testing and making lifestyle changes as recommended by your health care provider. Talk with your health care provider about:  Which screenings and tests you should have. A screening is a test that checks for a disease when you have no symptoms.  A diet and exercise plan that is right for you. What should I know about screenings and tests to prevent falls? Screening and testing are the best ways to find a health problem early. Early diagnosis and treatment give you the best chance of managing medical conditions that are common after age 19. Certain conditions and lifestyle choices may make you more likely to have a fall. Your health care provider may recommend:  Regular vision checks. Poor vision and conditions such as cataracts can make you more likely to have a fall. If you wear glasses, make sure to get your prescription updated if your vision changes.  Medicine review. Work with your health care provider to regularly review all of the medicines you are taking, including over-the-counter medicines. Ask your health care provider about any side effects that may make you more likely to have a fall. Tell your health care provider if any medicines that you take make you feel dizzy or sleepy.  Osteoporosis screening. Osteoporosis is a condition that causes the bones to get weaker. This can make the bones weak and cause them to break more easily.  Blood pressure screening. Blood pressure changes and medicines to control blood pressure can make you feel dizzy.  Strength and balance checks. Your health care provider may recommend certain tests to check your  strength and balance while standing, walking, or changing positions.  Foot health exam. Foot pain and numbness, as well as not wearing proper footwear, can make you more likely to have a fall.  Depression screening. You may be more likely to have a fall if you have a fear of falling, feel emotionally low, or feel unable to do activities that you used to do.  Alcohol use screening. Using too much alcohol can affect your balance and may make you more likely to have a fall. What actions can I take to lower my risk of falls? General instructions  Talk with your health care provider about your risks for falling. Tell your health care provider if: ? You fall. Be sure to tell your health care provider about all falls, even ones that seem minor. ? You feel dizzy, sleepy, or off-balance.  Take over-the-counter and prescription medicines only as told by your health care provider. These include any supplements.  Eat a healthy diet and maintain a healthy weight. A healthy diet includes low-fat dairy products, low-fat (lean) meats, and fiber from whole grains, beans, and lots of fruits and vegetables. Home safety  Remove any tripping hazards, such as rugs, cords, and clutter.  Install safety equipment such as grab bars in bathrooms and safety rails on stairs.  Keep rooms and walkways well-lit. Activity  Follow a regular exercise program to stay fit. This will help you maintain your balance. Ask your health care provider what types of exercise are appropriate for you.  If you need a cane or walker,  use it as recommended by your health care provider.  Wear supportive shoes that have nonskid soles.   Lifestyle  Do not drink alcohol if your health care provider tells you not to drink.  If you drink alcohol, limit how much you have: ? 0-1 drink a day for women. ? 0-2 drinks a day for men.  Be aware of how much alcohol is in your drink. In the [place] one drink equals one typical bottle of beer (12  oz), one-half glass of wine (5 oz), or one shot of hard liquor (1 oz).  Do not use any products that contain nicotine or tobacco, such as cigarettes and e-cigarettes. If you need help quitting, ask your health care provider. Summary  Having a healthy lifestyle and getting preventive care can help to protect your health and wellness after age 87.  Screening and testing are the best way to find a health problem early and help you avoid having a fall. Early diagnosis and treatment give you the best chance for managing medical conditions that are more common for people who are older than age 9.  Falls are a major cause of broken bones and head injuries in people who are older than age 27. Take precautions to prevent a fall at home.  Work with your health care provider to learn what changes you can make to improve your health and wellness and to prevent falls. This information is not intended to replace advice given to you by your health care provider. Make sure you discuss any questions you have with your health care provider. Document Revised: 05/19/2018 Document Reviewed: 12/09/2016 Elsevier Patient Education  2021 [place].  Palpitations Palpitations are feelings that your heartbeat is irregular or is faster than normal. It may feel like your heart is fluttering or skipping a beat. Palpitations are usually not a serious problem. They may be caused by many things, including smoking, caffeine, alcohol, stress, and certain medicines or drugs. Most causes of palpitations are not serious. However, some palpitations can be a sign of a serious problem. You may need further tests to rule out serious medical problems. Follow these instructions at home: Pay attention to any changes in your condition. Take these actions to help manage your symptoms: Eating and drinking  Avoid foods and drinks that may cause palpitations. These may include: ? Caffeinated coffee, tea, soft drinks, diet pills, and  energy drinks. ? Chocolate. ? Alcohol. Lifestyle  Take steps to reduce your stress and anxiety. Things that can help you relax include: ? Yoga. ? Mind-body activities, such as deep breathing, meditation, or using words and images to create positive thoughts (guided imagery). ? Physical activity, such as swimming, jogging, or walking. Tell your health care provider if your palpitations increase with activity. If you have chest pain or shortness of breath with activity, do not continue the activity until you are seen by your health care provider. ? Biofeedback. This is a method that helps you learn to use your mind to control things in your body, such as your heartbeat.  Do not use drugs, including cocaine or ecstasy. Do not use marijuana.  Get plenty of rest and sleep. Keep a regular bed time. General instructions  Take over-the-counter and prescription medicines only as told by your health care provider.  Do not use any products that contain nicotine or tobacco, such as cigarettes and e-cigarettes. If you need help quitting, ask your health care provider.  Keep all follow-up visits as told by your  health care provider. This is important. These may include visits for further testing if palpitations do not go away or get worse.      Contact a health care provider if you:  Continue to have a fast or irregular heartbeat after 24 hours.  Notice that your palpitations occur more often. Get help right away if you:  Have chest pain or shortness of breath.  Have a severe headache.  Feel dizzy or you faint. Summary  Palpitations are feelings that your heartbeat is irregular or is faster than normal. It may feel like your heart is fluttering or skipping a beat.  Palpitations may be caused by many things, including smoking, caffeine, alcohol, stress, certain medicines, and drugs.  Although most causes of palpitations are not serious, some causes can be a sign of a serious medical  problem.  Get help right away if you faint or have chest pain, shortness of breath, a severe headache, or dizziness. This information is not intended to replace advice given to you by your health care provider. Make sure you discuss any questions you have with your health care provider. Document Revised: 03/10/2017 Document Reviewed: 03/10/2017 Elsevier Patient Education  2021 [place].  Persistent Depressive Disorder, Adult Persistent depressive disorder is a mental health condition. This disorder causes mild sadness or loss of interest (low-level depression) that lasts for 2 years or longer. The disorder is also called long-term or chronic depression. If you have this disorder:  Your relationships can be affected.  You are more likely to get sick. What are the causes? The cause of this condition is not known. The disorder is likely caused by a mix of things, including:  Your personality traits, such as being a shy person.  Your thoughts and feelings that may be pessimistic or negative.  Too much alcohol or drugs.  How you react to stress.  Health and mental problems that you have had for a long time.  Things that hurt you in the past (trauma).  Big changes in your life. What increases the risk? The following factors may make you more likely to develop this condition:  Having family members with depression.  Being a woman.  Low levels of some brain chemicals.  Things that hurt you as a child, such as abuse or loss of a parent.  Living with stress, including: ? Living without basic needs of life, such as food and shelter. ? Being treated poorly because of race, sex, or religion (discrimination). ? Having problems in the family.  Health and mental problems that you have had for a long time. What are the signs or symptoms? You may have symptoms for most of the day. Symptoms include: Physical symptoms You may:  Feel tired.  Have low energy.  Eat too much or  too little.  Sleep too much or too little.  Be restless or agitated.  Have aches, pains, and other problems that have no cause. Problems with thinking and feelings These may include:  Feeling hopeless, worthless, or guilty.  Feeling worried or nervous.  Trouble with focus.  Trouble deciding things.  Seeing only bad things about yourself (low self-esteem).  Finding no joy in anything in life.  Feeling grouchy (irritable). Problems with how you act You may:  Be unable to have fun.  Be unable to feel pleasure.  Stay away from others (be withdrawn).  Act with too much force or too much anger. How is this treated? This condition may be treated with:  Talk  therapy. This teaches you to know bad thoughts, feelings, and actions and how to change them. This can also: ? This can also help you to communicate with others. ? Be done with members of your family.  Medicines. These can be used to treat worry (anxiety), depression, or low levels of chemicals in the brain.  Lifestyle changes. You may need to: ? Limit alcohol use. ? Limit drug use. ? Get regular exercise. ? Set a regular sleep schedule. ? Make better food choices. ? Spend more time outdoors. Therapy and medicine are the best treatments. Follow these instructions at home: Activity  Get regular exercise as told.  Spend time outdoors as told.  Find things that you like to do. Make time to do these things.  Find healthy ways to deal with stress. Try: ? Meditation. ? Deep breathing. ? Being in nature. ? Keeping a journal.  Return to your normal activities as told by your doctor. Ask your doctor what activities are safe for you.   Alcohol and drug use  If you drink alcohol: ? Limit how much you use to:  0-1 drink a day for nonpregnant women.  0-2 drinks a day for men. ? Be aware of how much alcohol is in your drink. In the [place] one drink equals one 12 oz bottle of beer (355 mL), one 5 oz glass of wine  (148 mL), or one 1 oz glass of hard liquor (44 mL).  Talk to your doctor about: ? Any alcohol use. Alcohol can affect some medicines. ? Any drug use. General instructions  Take over-the-counter, prescription medicines and herbal preparations only as told by your doctor.  Eat a healthy diet.  Get a lot of sleep.  Think about joining a support group. Your doctor may be able to suggest one.  Keep all follow-up visits as told by your doctor. This is important.   Where to find more information  [employer] on Mental Illness: www.nami.[place]: https://carter.com/ Contact a doctor if:  Your symptoms get worse.  You get new symptoms.  You have trouble sleeping or doing your daily activities. Get help right away if:  You hurt yourself.  You have serious thoughts about hurting yourself or others.  You see, hear, taste, smell, or feel things that are not real (hallucinate). If you ever feel like you may hurt yourself or others, or have thoughts about taking your own life, get help right away. Go to your nearest emergency department or:  Call your local emergency services (911 in the [place]  Call a suicide crisis helpline, such as the [facility] at 684-141-4275. This is open 24 hours a day in the [place]  Text the Crisis Text Line at (317)285-7787 (in the [place].). Summary  Persistent depressive disorder is a mental health condition. This disorder causes a low-level depression for 2 years or longer.  This condition is normally treated by mental health experts. You may need more than one type of treatment. You may need therapy, medicines, and lifestyle changes.  Get help right away if you have serious thoughts about hurting yourself or others. This information is not intended to replace advice given to you by your health care provider. Make sure you discuss any questions you have with your health care provider. Document  Revised: 01/07/2019 Document Reviewed: 01/07/2019 Elsevier Patient Education  2021 [place].  Plantar Fasciitis  Plantar fasciitis is a painful foot condition that affects the heel. It  occurs when the band of tissue that connects the toes to the heel bone (plantar fascia) becomes irritated. This can happen as the result of exercising too much or doing other repetitive activities (overuse injury). Plantar fasciitis can cause mild irritation to severe pain that makes it difficult to walk or move. The pain is usually worse in the morning after sleeping, or after sitting or lying down for a period of time. Pain may also be worse after long periods of walking or standing. What are the causes? This condition may be caused by:  Standing for long periods of time.  Wearing shoes that do not have good arch support.  Doing activities that put stress on joints (high-impact activities). This includes ballet and exercise that makes your heart beat faster (aerobic exercise), such as running.  Being overweight.  An abnormal way of walking (gait).  Tight muscles in the back of your lower leg (calf).  High arches in your feet or flat feet.  Starting a new athletic activity. What are the signs or symptoms? The main symptom of this condition is heel pain. Pain may get worse after the following:  Taking the first steps after a time of rest, especially in the morning after awakening, or after you have been sitting or lying down for a while.  Long periods of standing still. Pain may decrease after 30-45 minutes of activity, such as gentle walking. How is this diagnosed? This condition may be diagnosed based on your medical history, a physical exam, and your symptoms. Your health care provider will check for:  A tender area on the bottom of your foot.  A high arch in your foot or flat feet.  Pain when you move your foot.  Difficulty moving your foot. You may have imaging tests to confirm the  diagnosis, such as:  X-rays.  Ultrasound.  MRI. How is this treated? Treatment for plantar fasciitis depends on how severe your condition is. Treatment may include:  Rest, ice, pressure (compression), and raising (elevating) the affected foot. This is called RICE therapy. Your health care provider may recommend RICE therapy along with over-the-counter pain medicines to manage your pain.  Exercises to stretch your calves and your plantar fascia.  A splint that holds your foot in a stretched, upward position while you sleep (night splint).  Physical therapy to relieve symptoms and prevent problems in the future.  Injections of steroid medicine (cortisone) to relieve pain and inflammation.  Stimulating your plantar fascia with electrical impulses (extracorporeal shock wave therapy). This is usually the last treatment option before surgery.  Surgery, if other treatments have not worked after 12 months. Follow these instructions at home: Managing pain, stiffness, and swelling  If directed, put ice on the painful area. To do this: ? Put ice in a plastic bag, or use a frozen bottle of water. ? Place a towel between your skin and the bag or bottle. ? Roll the bottom of your foot over the bag or bottle. ? Do this for 20 minutes, 2-3 times a day.  Wear athletic shoes that have air-sole or gel-sole cushions, or try soft shoe inserts that are designed for plantar fasciitis.  Elevate your foot above the level of your heart while you are sitting or lying down.   Activity  Avoid activities that cause pain. Ask your health care provider what activities are safe for you.  Do physical therapy exercises and stretches as told by your health care provider.  Try activities and forms of  exercise that are easier on your joints (low impact). Examples include swimming, water aerobics, and biking. General instructions  Take over-the-counter and prescription medicines only as told by your health care  provider.  Wear a night splint while sleeping, if told by your health care provider. Loosen the splint if your toes tingle, become numb, or turn cold and blue.  Maintain a healthy weight, or work with your health care provider to lose weight as needed.  Keep all follow-up visits. This is important. Contact a health care provider if you have:  Symptoms that do not go away with home treatment.  Pain that gets worse.  Pain that affects your ability to move or do daily activities. Summary  Plantar fasciitis is a painful foot condition that affects the heel. It occurs when the band of tissue that connects the toes to the heel bone (plantar fascia) becomes irritated.  Heel pain is the main symptom of this condition. It may get worse after exercising too much or standing still for a long time.  Treatment varies, but it usually starts with rest, ice, pressure (compression), and raising (elevating) the affected foot. This is called RICE therapy. Over-the-counter medicines can also be used to manage pain. This information is not intended to replace advice given to you by your health care provider. Make sure you discuss any questions you have with your health care provider. Document Revised: 05/15/2019 Document Reviewed: 05/15/2019 Elsevier Patient Education  2021 [employer].

## 2020-04-16 ENCOUNTER — Telehealth: Payer: Self-pay

## 2020-04-16 ENCOUNTER — Other Ambulatory Visit: Payer: Self-pay | Admitting: Family Medicine

## 2020-04-16 DIAGNOSIS — G43C0 Periodic headache syndromes in child or adult, not intractable: Secondary | ICD-10-CM

## 2020-04-16 NOTE — Telephone Encounter (Signed)
Patient states that she received a call form psychology but couldn't talk and they were supposed to call her back.  She is unsure of who called her.  Can you help her?   Please and thank you.  Dm/cma

## 2020-04-17 NOTE — Telephone Encounter (Signed)
Have spoken to pt twice. Pt was given ph # to [facility] but they are on 4th floor. She said they recommended [facility]. Pt is trying to reach them to schedule appt.   Also gave pt main ph# [phone] to [facility] to determine best fit location for her as she does not like elevators.

## 2020-05-01 DIAGNOSIS — F331 Major depressive disorder, recurrent, moderate: Secondary | ICD-10-CM | POA: Diagnosis not present

## 2020-05-02 DIAGNOSIS — Z01419 Encounter for gynecological examination (general) (routine) without abnormal findings: Secondary | ICD-10-CM | POA: Diagnosis not present

## 2020-05-02 DIAGNOSIS — Z6821 Body mass index (BMI) 21.0-21.9, adult: Secondary | ICD-10-CM | POA: Diagnosis not present

## 2020-05-02 DIAGNOSIS — Z1231 Encounter for screening mammogram for malignant neoplasm of breast: Secondary | ICD-10-CM | POA: Diagnosis not present

## 2020-05-06 DIAGNOSIS — M533 Sacrococcygeal disorders, not elsewhere classified: Secondary | ICD-10-CM | POA: Diagnosis not present

## 2020-05-06 DIAGNOSIS — M25551 Pain in right hip: Secondary | ICD-10-CM | POA: Diagnosis not present

## 2020-05-06 LAB — HM MAMMOGRAPHY

## 2020-05-08 DIAGNOSIS — F331 Major depressive disorder, recurrent, moderate: Secondary | ICD-10-CM | POA: Diagnosis not present

## 2020-05-14 DIAGNOSIS — D225 Melanocytic nevi of trunk: Secondary | ICD-10-CM | POA: Diagnosis not present

## 2020-05-14 DIAGNOSIS — L738 Other specified follicular disorders: Secondary | ICD-10-CM | POA: Diagnosis not present

## 2020-05-14 DIAGNOSIS — Z85828 Personal history of other malignant neoplasm of skin: Secondary | ICD-10-CM | POA: Diagnosis not present

## 2020-05-14 DIAGNOSIS — L814 Other melanin hyperpigmentation: Secondary | ICD-10-CM | POA: Diagnosis not present

## 2020-05-14 DIAGNOSIS — L821 Other seborrheic keratosis: Secondary | ICD-10-CM | POA: Diagnosis not present

## 2020-05-14 DIAGNOSIS — L820 Inflamed seborrheic keratosis: Secondary | ICD-10-CM | POA: Diagnosis not present

## 2020-05-20 ENCOUNTER — Other Ambulatory Visit: Payer: Self-pay

## 2020-05-20 ENCOUNTER — Ambulatory Visit: Payer: Medicare PPO | Admitting: Cardiovascular Disease

## 2020-05-20 ENCOUNTER — Telehealth: Payer: Self-pay | Admitting: Cardiovascular Disease

## 2020-05-20 ENCOUNTER — Encounter: Payer: Self-pay | Admitting: Cardiovascular Disease

## 2020-05-20 VITALS — BP 120/74 | HR 74 | Ht 61.5 in | Wt 116.2 lb

## 2020-05-20 DIAGNOSIS — R002 Palpitations: Secondary | ICD-10-CM | POA: Diagnosis not present

## 2020-05-20 DIAGNOSIS — R072 Precordial pain: Secondary | ICD-10-CM | POA: Diagnosis not present

## 2020-05-20 DIAGNOSIS — I4949 Other premature depolarization: Secondary | ICD-10-CM | POA: Diagnosis not present

## 2020-05-20 DIAGNOSIS — Z8679 Personal history of other diseases of the circulatory system: Secondary | ICD-10-CM | POA: Diagnosis not present

## 2020-05-20 DIAGNOSIS — M5459 Other low back pain: Secondary | ICD-10-CM | POA: Diagnosis not present

## 2020-05-20 MED ORDER — ATENOLOL 25 MG PO TABS: 12.5000 mg | ORAL_TABLET | Freq: Every day | ORAL | 0 refills | Status: DC | PRN

## 2020-05-20 NOTE — Telephone Encounter (Signed)
Spoke with patient and her Atenolol is use by 11/2020 [facility] and left message to put on file, patient will call when needs filled

## 2020-05-20 NOTE — Patient Instructions (Signed)
Medication Instructions:  No changes *If you need a refill on your cardiac medications before your next appointment, please call your pharmacy*   Lab Work: None ordered If you have labs (blood work) drawn today and your tests are completely normal, you will receive your results only by: Marland Kitchen MyChart Message (if you have MyChart) OR . A paper copy in the mail If you have any lab test that is abnormal or we need to change your treatment, we will call you to review the results.   Testing/Procedures: None ordered   Follow-Up: At [facility], you and your health needs are our priority.  As part of our continuing mission to provide you with exceptional heart care, we have created designated Provider Care Teams.  These Care Teams include your primary Cardiologist (physician) and Advanced Practice Providers (APPs -  Physician Assistants and Nurse Practitioners) who all work together to provide you with the care you need, when you need it.  We recommend signing up for the patient portal called "MyChart".  Sign up information is provided on this After Visit Summary.  MyChart is used to connect with patients for Virtual Visits (Telemedicine).  Patients are able to view lab/test results, encounter notes, upcoming appointments, etc.  Non-urgent messages can be sent to your provider as well.   To learn more about what you can do with MyChart, go to [email or web address].    Your next appointment:   12 month(s)  The format for your next appointment:   In Person  Provider:   You may see Dr. Sallyanne Kuster or one of the following Advanced Practice Providers on your designated Care Team:    Almyra Deforest, PA-C  Fabian Sharp, PA-C or   Roby Lofts, [place]

## 2020-05-20 NOTE — Telephone Encounter (Signed)
    Pt c/o medication issue:  1. Name of Medication:   atenolol (TENORMIN) 25 MG tablet   2. How are you currently taking this medication (dosage and times per day)? Take 0.5 tablets (12.5 mg total) by mouth daily as needed (for palpitations).  3. Are you having a reaction (difficulty breathing--STAT)?   4. What is your medication issue? Pt is calling back, she said she fond a bottle of her Atenolol at home for 90 days supply dated 11/24/19. She just wanted to let Dr. Loletha Grayer know she is good with this meds and don't need refill as of the moment

## 2020-05-20 NOTE — Progress Notes (Signed)
Cardiology Consultation Note:    Date:  05/20/2020   ID:  [name], DOB April 29, 1950, MRN [id number]  PCP:  Libby Maw, MD   [place]  Cardiologist:  No primary care provider on file. NEW (remotely, Dr. Eustace Quail) Advanced Practice Provider:  No care team member to display Electrophysiologist:  None       Referring MD: Libby Maw,*   No chief complaint on file. [name] is a 70 y.o. female who is being seen today for the evaluation of chest pain at the request of Libby Maw,*.   History of Present Illness:    [name] is a 70 y.o. female with a hx of paroxysmal atrial tachycardia more than 10 years ago, when she saw Dr. Olevia Perches.  Symptoms subsided and since that time she has taken infrequent low doses of atenolol, as needed for palpitations.  She has not had any sustained palpitations in years.  She has occasional isolated "skips" especially since she has been under increased stress due to the health problems of her 32 year old father.  She is recently had some subxiphoid chest discomfort.  This occurs at rest, has a sharp quality and lasts less than a minute at a time.  It never occurs during physical exercise.  She does not have exertional chest tightness or dyspnea.  She denies orthopnea, PND or lower extremity edema.  She is very active.  She has not had dizziness or syncope either associated with the palpitations or independent of these.  She has occasional migraines.  Past medical history is significant for the absence of major chronic illnesses including no diabetes, hypertension, hypercholesterolemia, CAD, PVD, stroke or TIA.  Her most recent lipid profile performed just a month ago was excellent with an HDL of 75 and LDL of 80.  She has normal renal function and normal hemoglobin A1c.  She does not smoke.  She had an echocardiogram performed roughly 2 years ago with normal findings.  In the 1990s  she had been told that she has mitral valve prolapse, but this was not confirmed on the most recent study.  We looked at the images together today and I agree that there is [place] no evidence of mitral valve prolapse.  She had a normal stress echocardiogram in 2018.  Past Medical History:  Diagnosis Date  . Anal fissure   . Anemia    during pregnancy  . Anxiety   . Basal cell carcinoma   . DDD (degenerative disc disease)   . Diverticulosis   . Hypokalemia   . LBP (low back pain)   . Melanoma ([place])   . Migraine   . MVP (mitral valve prolapse) 1990   psa  . Rosacea   . Ulcer    as a child    Past Surgical History:  Procedure Laterality Date  . ABDOMINAL HYSTERECTOMY  1985  . BREAST BIOPSY Left    secondary to infection  . I & D EXTREMITY Right 09/09/2012   Procedure: Minor IRRIGATION AND DEBRIDEMENT EXTREMITY paronychia of right thumb;  Surgeon: Wynonia Sours, MD;  Location: [facility];  Service: Orthopedics;  Laterality: Right;  . I & D EXTREMITY Left 01/26/2014   Procedure: MINOR IINCISION AND DRAINAGE paronychia left index finger;  Surgeon: Daryll Brod, MD;  Location: [facility];  Service: Orthopedics;  Laterality: Left;  left index  . OOPHORECTOMY Left 1994  . REPAIR KNEE LIGAMENT Left 1989  .  TONSILLECTOMY  1955  . TUBAL LIGATION    . VEIN SURGERY      Current Medications: Current Meds  Medication Sig  . atenolol (TENORMIN) 25 MG tablet One half tablet when necessary for palpitations  . calcium-vitamin D 250-100 MG-UNIT per tablet Take 1 tablet by mouth daily.  Marland Kitchen docusate sodium (COLACE) 100 MG capsule Take 100 mg by mouth daily. Daily at bedtime   . doxycycline (VIBRA-TABS) 100 MG tablet Take 1 tablet (100 mg total) by mouth 2 (two) times daily. (Patient taking differently: Take 100 mg by mouth as needed.)  . estradiol (ESTRACE) 0.5 MG tablet Take 0.5 mg by mouth daily.  . hydrocortisone (ANUSOL-HC) 2.5 % rectal cream Place 1 application  rectally 2 (two) times daily. (Patient taking differently: Place 1 application rectally as needed.)  . lidocaine-prilocaine (EMLA) cream Apply 1 application topically as needed.  . meloxicam (MOBIC) 15 MG tablet Take 15 mg by mouth as needed.  . Multiple Vitamin (MULTIVITAMIN) tablet Take 1 tablet by mouth daily.  . NON FORMULARY Hydro eye  . omeprazole (PRILOSEC) 20 MG capsule Take 1 capsule (20 mg total) by mouth daily. AS NEEDED (Patient taking differently: Take 20 mg by mouth as needed. AS NEEDED)  . zolmitriptan (ZOMIG-ZMT) 2.5 MG disintegrating tablet TAKE ONE TABLET BY MOUTH AS NEEDED FOR MIGRAINE     Allergies:   Codeine, Compazine, Gabapentin, Prednisone, Prochlorperazine edisylate, Sulfa antibiotics, and Sulfonamide derivatives   Social History   Socioeconomic History  . Marital status: Married    Spouse name: Not on file  . Number of children: 2  . Years of education: Not on file  . Highest education level: Not on file  Occupational History  . Occupation: retired Conservation officer, historic buildings  Tobacco Use  . Smoking status: Never Smoker  . Smokeless tobacco: Never Used  Vaping Use  . Vaping Use: Never used  Substance and Sexual Activity  . Alcohol use: Yes    Alcohol/week: 1.0 standard drink    Types: 1 Standard drinks or equivalent per week    Comment: social wine  . Drug use: No  . Sexual activity: Yes  Other Topics Concern  . Not on file  Social History Narrative  . Not on file   Social Determinants of Health   Financial Resource Strain: Low Risk   . Difficulty of Paying Living Expenses: Not hard at all  Food Insecurity: No Food Insecurity  . Worried About Charity fundraiser in the Last Year: Never true  . Ran Out of Food in the Last Year: Never true  Transportation Needs: No Transportation Needs  . Lack of Transportation (Medical): No  . Lack of Transportation (Non-Medical): No  Physical Activity: Sufficiently Active  . Days of Exercise per Week: 4 days  . Minutes of  Exercise per Session: 40 min  Stress: No Stress Concern Present  . Feeling of Stress : Only a little  Social Connections: Socially Integrated  . Frequency of Communication with Friends and Family: More than three times a week  . Frequency of Social Gatherings with Friends and Family: Once a week  . Attends Religious Services: More than 4 times per year  . Active Member of Clubs or Organizations: Yes  . Attends Archivist Meetings: More than 4 times per year  . Marital Status: Married     Family History: The patient's family history includes Colitis in her father; Colon polyps in her father and mother; Esophageal cancer in her maternal aunt;  Heart disease in her father and mother. There is no history of Colon cancer.  ROS:   Please see the history of present illness.     All other systems reviewed and are negative.  EKGs/Labs/Other Studies Reviewed:    The following studies were reviewed today: Echocardiogram 2020, stress echocardiogram 2018  EKG:  EKG is  ordered today.  The ekg ordered today demonstrates normal sinus rhythm and is a completely normal tracing.  Recent Labs: 04/15/2020: ALT 13; BUN 15; Creatinine, Ser 0.69; Hemoglobin 13.6; Platelets 271.0; Potassium 4.5; Sodium 140; TSH 1.14  Recent Lipid Panel    Component Value Date/Time   CHOL 183 04/15/2020 1001   TRIG 139.0 04/15/2020 1001   HDL 74.90 04/15/2020 1001   CHOLHDL 2 04/15/2020 1001   VLDL 27.8 04/15/2020 1001   LDLCALC 80 04/15/2020 1001     Risk Assessment/Calculations:       Physical Exam:    VS:  BP 120/74 (BP Location: Left Arm, Patient Position: Sitting, Cuff Size: Normal)   Pulse 74   Ht 5' 1.5" (1.562 m)   Wt 116 lb 3.2 oz (52.7 kg)   BMI 21.60 kg/m     Wt Readings from Last 3 Encounters:  05/20/20 116 lb 3.2 oz (52.7 kg)  04/15/20 116 lb (52.6 kg)  01/16/20 116 lb (52.6 kg)     GEN: She appears fit and lean than her stated age; well nourished, well developed in no acute  distress HEENT: Normal NECK: No JVD; No carotid bruits LYMPHATICS: No lymphadenopathy CARDIAC: RRR, no murmurs, rubs, gallops RESPIRATORY:  Clear to auscultation without rales, wheezing or rhonchi  ABDOMEN: Soft, non-tender, non-distended MUSCULOSKELETAL:  No edema; No deformity  SKIN: Warm and dry NEUROLOGIC:  Alert and oriented x 3 PSYCHIATRIC:  Normal affect   ASSESSMENT:    1. Palpitations   2. Premature beat    PLAN:    In order of problems listed above:  1. Chest pain: This would be highly atypical for angina.  She does not have any risk factors for coronary disease.  Mom and dad both had pacemakers, but they did not have vascular disease.  She had a relatively recent normal stress echo.  I do not think additional evaluation is indicated. 2. Palpitations: Likely she has isolated PACs or PVCs.  Can continue with her current prescription of as needed atenolol. 3. History of mitral valve prolapse: Not confirmed on her recent echocardiogram.  Discussed the fact that this disorder was over diagnosed by echocardiography in the 1980s and 90s.        Medication Adjustments/Labs and Tests Ordered: Current medicines are reviewed at length with the patient today.  Concerns regarding medicines are outlined above.  No orders of the defined types were placed in this encounter.  No orders of the defined types were placed in this encounter.   There are no Patient Instructions on file for this visit.   Signed, Sanda Klein, MD  05/20/2020 10:58 AM    [place]

## 2020-05-21 ENCOUNTER — Other Ambulatory Visit: Payer: Self-pay | Admitting: Family Medicine

## 2020-05-21 DIAGNOSIS — K644 Residual hemorrhoidal skin tags: Secondary | ICD-10-CM

## 2020-05-21 MED ORDER — HYDROCORTISONE (PERIANAL) 2.5 % EX CREA: 1.0000 "application " | TOPICAL_CREAM | Freq: Two times a day (BID) | CUTANEOUS | 0 refills | Status: DC

## 2020-06-04 DIAGNOSIS — F331 Major depressive disorder, recurrent, moderate: Secondary | ICD-10-CM | POA: Diagnosis not present

## 2020-06-06 ENCOUNTER — Other Ambulatory Visit: Payer: Self-pay

## 2020-06-07 ENCOUNTER — Ambulatory Visit: Payer: Medicare PPO | Admitting: Family Medicine

## 2020-06-07 ENCOUNTER — Encounter: Payer: Self-pay | Admitting: Family Medicine

## 2020-06-07 VITALS — BP 116/64 | HR 77 | Temp 97.1°F | Ht 61.0 in | Wt 115.4 lb

## 2020-06-07 DIAGNOSIS — F341 Dysthymic disorder: Secondary | ICD-10-CM | POA: Diagnosis not present

## 2020-06-07 DIAGNOSIS — M722 Plantar fascial fibromatosis: Secondary | ICD-10-CM

## 2020-06-07 NOTE — Patient Instructions (Signed)
Plantar Fasciitis  Plantar fasciitis is a painful foot condition that affects the heel. It occurs when the band of tissue that connects the toes to the heel bone (plantar fascia) becomes irritated. This can happen as the result of exercising too much or doing other repetitive activities (overuse injury). Plantar fasciitis can cause mild irritation to severe pain that makes it difficult to walk or move. The pain is usually worse in the morning after sleeping, or after sitting or lying down for a period of time. Pain may also be worse after long periods of walking or standing. What are the causes? This condition may be caused by:  Standing for long periods of time.  Wearing shoes that do not have good arch support.  Doing activities that put stress on joints (high-impact activities). This includes ballet and exercise that makes your heart beat faster (aerobic exercise), such as running.  Being overweight.  An abnormal way of walking (gait).  Tight muscles in the back of your lower leg (calf).  High arches in your feet or flat feet.  Starting a new athletic activity. What are the signs or symptoms? The main symptom of this condition is heel pain. Pain may get worse after the following:  Taking the first steps after a time of rest, especially in the morning after awakening, or after you have been sitting or lying down for a while.  Long periods of standing still. Pain may decrease after 30-45 minutes of activity, such as gentle walking. How is this diagnosed? This condition may be diagnosed based on your medical history, a physical exam, and your symptoms. Your health care provider will check for:  A tender area on the bottom of your foot.  A high arch in your foot or flat feet.  Pain when you move your foot.  Difficulty moving your foot. You may have imaging tests to confirm the diagnosis, such as:  X-rays.  Ultrasound.  MRI. How is this treated? Treatment for plantar  fasciitis depends on how severe your condition is. Treatment may include:  Rest, ice, pressure (compression), and raising (elevating) the affected foot. This is called RICE therapy. Your health care provider may recommend RICE therapy along with over-the-counter pain medicines to manage your pain.  Exercises to stretch your calves and your plantar fascia.  A splint that holds your foot in a stretched, upward position while you sleep (night splint).  Physical therapy to relieve symptoms and prevent problems in the future.  Injections of steroid medicine (cortisone) to relieve pain and inflammation.  Stimulating your plantar fascia with electrical impulses (extracorporeal shock wave therapy). This is usually the last treatment option before surgery.  Surgery, if other treatments have not worked after 12 months. Follow these instructions at home: Managing pain, stiffness, and swelling  If directed, put ice on the painful area. To do this: ? Put ice in a plastic bag, or use a frozen bottle of water. ? Place a towel between your skin and the bag or bottle. ? Roll the bottom of your foot over the bag or bottle. ? Do this for 20 minutes, 2-3 times a day.  Wear athletic shoes that have air-sole or gel-sole cushions, or try soft shoe inserts that are designed for plantar fasciitis.  Elevate your foot above the level of your heart while you are sitting or lying down.   Activity  Avoid activities that cause pain. Ask your health care provider what activities are safe for you.  Do physical therapy exercises   and stretches as told by your health care provider.  Try activities and forms of exercise that are easier on your joints (low impact). Examples include swimming, water aerobics, and biking. General instructions  Take over-the-counter and prescription medicines only as told by your health care provider.  Wear a night splint while sleeping, if told by your health care provider. Loosen the  splint if your toes tingle, become numb, or turn cold and blue.  Maintain a healthy weight, or work with your health care provider to lose weight as needed.  Keep all follow-up visits. This is important. Contact a health care provider if you have:  Symptoms that do not go away with home treatment.  Pain that gets worse.  Pain that affects your ability to move or do daily activities. Summary  Plantar fasciitis is a painful foot condition that affects the heel. It occurs when the band of tissue that connects the toes to the heel bone (plantar fascia) becomes irritated.  Heel pain is the main symptom of this condition. It may get worse after exercising too much or standing still for a long time.  Treatment varies, but it usually starts with rest, ice, pressure (compression), and raising (elevating) the affected foot. This is called RICE therapy. Over-the-counter medicines can also be used to manage pain. This information is not intended to replace advice given to you by your health care provider. Make sure you discuss any questions you have with your health care provider. Document Revised: 05/15/2019 Document Reviewed: 05/15/2019 Elsevier Patient Education  2021 Elsevier Inc.  

## 2020-06-07 NOTE — Progress Notes (Signed)
Established Patient Office Visit  Subjective:  Patient ID: [name], female    DOB: Sep 02, 1950  Age: 70 y.o. MRN: [id number]  CC:  Chief Complaint  Patient presents with  . Foot Pain    C/O left heel pain x 2 weeks.     HPI [name] presents for 2 to 3-week history of left heel pain after she had walked on the beach.  There is no particular injury.  It does seem to be a little worse when she gets up in the morning or after she has been sitting.  She was able to find a counselor and feels as though her time with that person has been quite helpful.  Past Medical History:  Diagnosis Date  . Anal fissure   . Anemia    during pregnancy  . Anxiety   . Basal cell carcinoma   . DDD (degenerative disc disease)   . Diverticulosis   . Hypokalemia   . LBP (low back pain)   . Melanoma ([place])   . Migraine   . MVP (mitral valve prolapse) 1990   psa  . Rosacea   . Ulcer    as a child    Past Surgical History:  Procedure Laterality Date  . ABDOMINAL HYSTERECTOMY  1985  . BREAST BIOPSY Left    secondary to infection  . I & D EXTREMITY Right 09/09/2012   Procedure: Minor IRRIGATION AND DEBRIDEMENT EXTREMITY paronychia of right thumb;  Surgeon: Wynonia Sours, MD;  Location: [facility];  Service: Orthopedics;  Laterality: Right;  . I & D EXTREMITY Left 01/26/2014   Procedure: MINOR IINCISION AND DRAINAGE paronychia left index finger;  Surgeon: Daryll Brod, MD;  Location: [facility];  Service: Orthopedics;  Laterality: Left;  left index  . OOPHORECTOMY Left 1994  . REPAIR KNEE LIGAMENT Left 1989  . TONSILLECTOMY  1955  . TUBAL LIGATION    . VEIN SURGERY      Family History  Problem Relation Age of Onset  . Heart disease Mother   . Colon polyps Mother   . Heart disease Father   . Colitis Father   . Colon polyps Father   . Esophageal cancer Maternal Aunt   . Colon cancer Neg Hx     Social History   Socioeconomic History  . Marital  status: Married    Spouse name: Not on file  . Number of children: 2  . Years of education: Not on file  . Highest education level: Not on file  Occupational History  . Occupation: retired Conservation officer, historic buildings  Tobacco Use  . Smoking status: Never Smoker  . Smokeless tobacco: Never Used  Vaping Use  . Vaping Use: Never used  Substance and Sexual Activity  . Alcohol use: Yes    Alcohol/week: 1.0 standard drink    Types: 1 Standard drinks or equivalent per week    Comment: social wine  . Drug use: No  . Sexual activity: Yes  Other Topics Concern  . Not on file  Social History Narrative  . Not on file   Social Determinants of Health   Financial Resource Strain: Low Risk   . Difficulty of Paying Living Expenses: Not hard at all  Food Insecurity: No Food Insecurity  . Worried About Charity fundraiser in the Last Year: Never true  . Ran Out of Food in the Last Year: Never true  Transportation Needs: No Transportation Needs  . Lack of  Transportation (Medical): No  . Lack of Transportation (Non-Medical): No  Physical Activity: Sufficiently Active  . Days of Exercise per Week: 4 days  . Minutes of Exercise per Session: 40 min  Stress: No Stress Concern Present  . Feeling of Stress : Only a little  Social Connections: Socially Integrated  . Frequency of Communication with Friends and Family: More than three times a week  . Frequency of Social Gatherings with Friends and Family: Once a week  . Attends Religious Services: More than 4 times per year  . Active Member of Clubs or Organizations: Yes  . Attends Archivist Meetings: More than 4 times per year  . Marital Status: Married  Human resources officer Violence: Not At Risk  . Fear of Current or Ex-Partner: No  . Emotionally Abused: No  . Physically Abused: No  . Sexually Abused: No    Outpatient Medications Prior to Visit  Medication Sig Dispense Refill  . atenolol (TENORMIN) 25 MG tablet Take 0.5 tablets (12.5 mg total) by  mouth daily as needed (for palpitations). 15 tablet 0  . docusate sodium (COLACE) 100 MG capsule Take 100 mg by mouth daily. Daily at bedtime     . doxycycline (VIBRA-TABS) 100 MG tablet Take 1 tablet (100 mg total) by mouth 2 (two) times daily. (Patient taking differently: Take 100 mg by mouth as needed.) 20 tablet 0  . estradiol (ESTRACE) 0.5 MG tablet Take 0.5 mg by mouth daily.    . hydrocortisone (ANUSOL-HC) 2.5 % rectal cream Place 1 application rectally 2 (two) times daily. 30 g 0  . meloxicam (MOBIC) 15 MG tablet Take 15 mg by mouth as needed.    . Multiple Vitamin (MULTIVITAMIN) tablet Take 1 tablet by mouth daily.    . NON FORMULARY Hydro eye    . omeprazole (PRILOSEC) 20 MG capsule Take 1 capsule (20 mg total) by mouth daily. AS NEEDED (Patient taking differently: Take 20 mg by mouth as needed. AS NEEDED) 100 capsule 4  . zolmitriptan (ZOMIG-ZMT) 2.5 MG disintegrating tablet TAKE ONE TABLET BY MOUTH AS NEEDED FOR MIGRAINE 10 tablet 4  . calcium-vitamin D 250-100 MG-UNIT per tablet Take 1 tablet by mouth daily. (Patient not taking: Reported on 06/07/2020)    . predniSONE (DELTASONE) 5 MG tablet prednisone 5 mg tablet  TAKE 1 TABLET BY MOUTH THREE TIMES DAILY FOR 2 DAYS THEN TAKE 1 TABLET TWICE DAILY FOR 5 DAYS THEN TAKE 1 TABLET EVERY DAY UNTIL ALL TAKEN    . lidocaine-prilocaine (EMLA) cream Apply 1 application topically as needed. (Patient not taking: Reported on 06/07/2020) 30 g 0   No facility-administered medications prior to visit.    Allergies  Allergen Reactions  . Codeine   . Compazine   . Gabapentin   . Prednisone     REACTION: makes heart beat hard \\T \ fast  . Prochlorperazine Edisylate   . Sulfa Antibiotics   . Sulfonamide Derivatives     ROS Review of Systems  Constitutional: Negative.   Respiratory: Negative.   Cardiovascular: Negative.   Gastrointestinal: Negative.   Musculoskeletal: Positive for arthralgias.  Psychiatric/Behavioral: Negative.        Objective:    Physical Exam Vitals and nursing note reviewed.  Constitutional:      General: She is not in acute distress.    Appearance: Normal appearance. She is not ill-appearing, toxic-appearing or diaphoretic.  HENT:     Head: Normocephalic and atraumatic.     Right Ear: External ear normal.  Left Ear: External ear normal.  Eyes:     General:        Right eye: No discharge.        Left eye: No discharge.     Conjunctiva/sclera: Conjunctivae normal.  Pulmonary:     Effort: Pulmonary effort is normal.  Musculoskeletal:     Left foot: Normal capillary refill. Tenderness present. No swelling, deformity, foot drop or prominent metatarsal heads. Normal pulse.       Legs:  Skin:    General: Skin is warm and dry.  Neurological:     Mental Status: She is alert and oriented to person, place, and time.  Psychiatric:        Mood and Affect: Mood normal.        Behavior: Behavior normal.     BP 116/64   Pulse 77   Temp (!) 97.1 F (36.2 C) (Temporal)   Ht 5\' 1"  (1.549 m)   Wt 115 lb 6.4 oz (52.3 kg)   SpO2 98%   BMI 21.80 kg/m  Wt Readings from Last 3 Encounters:  06/07/20 115 lb 6.4 oz (52.3 kg)  05/20/20 116 lb 3.2 oz (52.7 kg)  04/15/20 116 lb (52.6 kg)     There are no preventive care reminders to display for this patient.  There are no preventive care reminders to display for this patient.  Lab Results  Component Value Date   TSH 1.14 04/15/2020   Lab Results  Component Value Date   WBC 9.4 04/15/2020   HGB 13.6 04/15/2020   HCT 40.0 04/15/2020   MCV 93.9 04/15/2020   PLT 271.0 04/15/2020   Lab Results  Component Value Date   NA 140 04/15/2020   K 4.5 04/15/2020   CO2 31 04/15/2020   GLUCOSE 105 (H) 04/15/2020   BUN 15 04/15/2020   CREATININE 0.69 04/15/2020   BILITOT 0.5 04/15/2020   ALKPHOS 58 04/15/2020   AST 17 04/15/2020   ALT 13 04/15/2020   PROT 7.1 04/15/2020   ALBUMIN 4.3 04/15/2020   CALCIUM 10.1 04/15/2020   ANIONGAP 8  02/22/2018   GFR 87.22 04/15/2020   Lab Results  Component Value Date   CHOL 183 04/15/2020   Lab Results  Component Value Date   HDL 74.90 04/15/2020   Lab Results  Component Value Date   LDLCALC 80 04/15/2020   Lab Results  Component Value Date   TRIG 139.0 04/15/2020   Lab Results  Component Value Date   CHOLHDL 2 04/15/2020   No results found for: HGBA1C    Assessment & Plan:   Problem List Items Addressed This Visit      Musculoskeletal and Integument   Plantar fasciitis - Primary     Other   Dysthymia      No orders of the defined types were placed in this encounter.   Follow-up: Return if symptoms worsen or fail to improve.  Patient will start her meloxicam 15 mg daily for 2 weeks.  She will continue applying Voltaren gel to the heel.  She will use supplemental Tylenol as needed.  She will perform dorsi extension exercises throughout the day.  She will buy shoes with good heel and toe cushioning as well as good arch support.  Continue with counseling.  Libby Maw, MD

## 2020-06-12 ENCOUNTER — Telehealth: Payer: Self-pay

## 2020-06-12 ENCOUNTER — Ambulatory Visit (INDEPENDENT_AMBULATORY_CARE_PROVIDER_SITE_OTHER): Payer: Medicare PPO

## 2020-06-12 ENCOUNTER — Other Ambulatory Visit: Payer: Medicare PPO

## 2020-06-12 ENCOUNTER — Other Ambulatory Visit: Payer: Self-pay

## 2020-06-12 DIAGNOSIS — M79672 Pain in left foot: Secondary | ICD-10-CM

## 2020-06-12 DIAGNOSIS — M7732 Calcaneal spur, left foot: Secondary | ICD-10-CM | POA: Diagnosis not present

## 2020-06-12 DIAGNOSIS — M722 Plantar fascial fibromatosis: Secondary | ICD-10-CM | POA: Diagnosis not present

## 2020-06-12 DIAGNOSIS — M7752 Other enthesopathy of left foot: Secondary | ICD-10-CM | POA: Diagnosis not present

## 2020-06-12 DIAGNOSIS — M19072 Primary osteoarthritis, left ankle and foot: Secondary | ICD-10-CM | POA: Diagnosis not present

## 2020-06-12 NOTE — Addendum Note (Signed)
Addended by: Lynnea Ferrier on: 06/12/2020 02:26 PM   Modules accepted: Orders

## 2020-06-12 NOTE — Telephone Encounter (Signed)
Patient was seen last week for foot pain. I was out so an x ray couldn't be done. She called to say her pain level went from 8-10 down to 2-4. Now that I'm back, she wants to know if she should get an xray.   Please advise

## 2020-06-12 NOTE — Addendum Note (Signed)
Addended by: Akyah Lagrange M on: 06/12/2020 02:26 PM   Modules accepted: Orders  

## 2020-06-12 NOTE — Telephone Encounter (Signed)
Returned patients call no answer. LM on VM informing patient that she could come in for x-ray or just she could wait since the pain have subsided. Asked patient to give [place] a call if she would like to go ahead with the x-ray or just call if pain comes back.

## 2020-06-18 NOTE — Telephone Encounter (Signed)
Patient states that she is still having pain and would like for the nurse to call her. Please call her back at [phone].

## 2020-06-18 NOTE — Telephone Encounter (Signed)
Spoke with patient who verbally understood xray results patient states that she still has some pain in her foot and her 2 weeks with taking Meloxicam will be up this week she will be running out soon. Patient states that she has prednisone 5mg  that was prescribed by another doctor a few weeks ago she did not take this due to leaving it at home when she went out of town not sure if she should take this now along with finishing the meloxicam or if she should have more meloxicam to help take the pain away completley? Please advise.

## 2020-06-19 ENCOUNTER — Other Ambulatory Visit: Payer: Self-pay

## 2020-06-19 DIAGNOSIS — M722 Plantar fascial fibromatosis: Secondary | ICD-10-CM

## 2020-06-19 MED ORDER — MELOXICAM 15 MG PO TABS: 15.0000 mg | ORAL_TABLET | ORAL | 1 refills | Status: DC | PRN

## 2020-06-19 NOTE — Telephone Encounter (Signed)
Patient states that she received refill on Meloxicam yesterday she would like to continue taking that for the time being and also would like to know if he could have a referral to PT she is established at [facility] and have seen Lum Babe in the past, she would like to hold on injection for now. Please advise.

## 2020-06-19 NOTE — Telephone Encounter (Signed)
Okay to refer to PT

## 2020-06-19 NOTE — Telephone Encounter (Signed)
Done. Patient aware.

## 2020-07-02 ENCOUNTER — Other Ambulatory Visit: Payer: Self-pay

## 2020-07-02 ENCOUNTER — Encounter: Payer: Self-pay | Admitting: Physical Therapy

## 2020-07-02 ENCOUNTER — Ambulatory Visit: Payer: Medicare PPO | Attending: Family Medicine | Admitting: Physical Therapy

## 2020-07-02 DIAGNOSIS — M25675 Stiffness of left foot, not elsewhere classified: Secondary | ICD-10-CM | POA: Diagnosis not present

## 2020-07-02 DIAGNOSIS — M79672 Pain in left foot: Secondary | ICD-10-CM | POA: Insufficient documentation

## 2020-07-02 DIAGNOSIS — R262 Difficulty in walking, not elsewhere classified: Secondary | ICD-10-CM | POA: Diagnosis not present

## 2020-07-02 DIAGNOSIS — F331 Major depressive disorder, recurrent, moderate: Secondary | ICD-10-CM | POA: Diagnosis not present

## 2020-07-02 NOTE — Therapy (Signed)
[facility]. [place], [place], [address] Phone: [phone]   Fax:  [phone]  Physical Therapy Evaluation  Patient Details  Name: [name] MRN: [id number] Date of Birth: 04-11-1950 Referring Provider (PT): Kremmer   Encounter Date: 07/02/2020   PT End of Session - 07/02/20 1521    Visit Number 1    Number of Visits 12    Date for PT Re-Evaluation 08/16/20    Authorization Type [employer]    PT Start Time 0539    PT Stop Time 1530    PT Time Calculation (min) 45 min    Activity Tolerance Patient tolerated treatment well    Behavior During Therapy [facility] for tasks assessed/performed           Past Medical History:  Diagnosis Date  . Anal fissure   . Anemia    during pregnancy  . Anxiety   . Basal cell carcinoma   . DDD (degenerative disc disease)   . Diverticulosis   . Hypokalemia   . LBP (low back pain)   . Melanoma ([place])   . Migraine   . MVP (mitral valve prolapse) 1990   psa  . Rosacea   . Ulcer    as a child    Past Surgical History:  Procedure Laterality Date  . ABDOMINAL HYSTERECTOMY  1985  . BREAST BIOPSY Left    secondary to infection  . I & D EXTREMITY Right 09/09/2012   Procedure: Minor IRRIGATION AND DEBRIDEMENT EXTREMITY paronychia of right thumb;  Surgeon: Wynonia Sours, MD;  Location: [place];  Service: Orthopedics;  Laterality: Right;  . I & D EXTREMITY Left 01/26/2014   Procedure: MINOR IINCISION AND DRAINAGE paronychia left index finger;  Surgeon: Daryll Brod, MD;  Location: [place];  Service: Orthopedics;  Laterality: Left;  left index  . OOPHORECTOMY Left 1994  . REPAIR KNEE LIGAMENT Left 1989  . TONSILLECTOMY  1955  . TUBAL LIGATION    . VEIN SURGERY      There were no vitals filed for this visit.    Subjective Assessment - 07/02/20 1449    Subjective Patient reports that she has had left foot pain since easter, on the plantar surface  of the heel, she is unsure of a specific cause, she reports that she was haivng some pain prior to going to the beach , she was unsure if that was the cause because she had on tennis shoes.  She saw the MD and was started doing stretches, she reports very painful for about 4 weeks but better the past week    Limitations Standing    How long can you stand comfortably? 10 minutes then pain    How long can you walk comfortably? 10 minutes then pain    Patient Stated Goals have less pain, walk without difficulty    Currently in Pain? Yes    Pain Score 2     Pain Location Heel    Pain Orientation Left    Pain Descriptors / Indicators Aching;Sore    Pain Type Acute pain    Pain Onset More than a month ago    Pain Frequency Constant    Aggravating Factors  walking and standing pain was up to 10/10    Pain Relieving Factors the stretches seem to have helped, taking meloxicam pain at best can be 1-2/10    Effect of Pain on Daily Activities difficulty standing and  walking              [facility] PT Assessment - 07/02/20 0001      Assessment   Medical Diagnosis left plantar fascitis    Referring Provider (PT) Kremmer    Onset Date/Surgical Date 06/02/20    Prior Therapy not for the foot      Precautions   Precautions None      Balance Screen   Has the patient fallen in the past 6 months No    Has the patient had a decrease in activity level because of a fear of falling?  No    Is the patient reluctant to leave their home because of a fear of falling?  No      Home Environment   Additional Comments does housework and some gardening      Prior Function   Level of Independence Independent    Vocation Retired    Leisure likes to walk on the beach      ROM / Strength   AROM / PROM / Strength AROM;Strength      AROM   AROM Assessment Site Ankle    Right/Left Ankle Left    Left Ankle Dorsiflexion 3    Left Ankle Plantar Flexion 40    Left Ankle Inversion 25    Left Ankle Eversion 7       Strength   Overall Strength Comments left 4/5  reports that she feels weak in the ankle and the foot      Palpation   Palpation comment very tender at the PF origin      Ambulation/Gait   Gait Comments has slight limp on the left, she reports that until about 2 weeks ago she could barely walk.                      Objective measurements completed on examination: See above findings.       [facility] Adult PT Treatment/Exercise - 07/02/20 0001      Modalities   Modalities Iontophoresis      Iontophoresis   Type of Iontophoresis Dexamethasone    Location left PF origin    Dose 64mA    Time 4hour patch                  PT Education - 07/02/20 1520    Education Details Calf stretch toes on book and also stretch with the towel and pulling toes back    Person(s) Educated Patient    Methods Explanation;Demonstration;Handout;Tactile cues;Verbal cues    Comprehension Verbalized understanding;Returned demonstration;Verbal cues required;Tactile cues required            PT Short Term Goals - 07/02/20 1526      PT SHORT TERM GOAL #1   Title independent with initial HEP    Time 2    Period Weeks    Status New             PT Long Term Goals - 07/02/20 1526      PT LONG TERM GOAL #1   Title decrease pain 50%    Time 8    Period Weeks    Status New      PT LONG TERM GOAL #2   Title increase DF to 10 degrees    Time 8    Period Weeks    Status New      PT LONG TERM GOAL #3   Title independent with RICE  Time 8    Period Weeks      PT LONG TERM GOAL #4   Title no difficulty walking    Time 8    Period Weeks    Status New                  Plan - 07/02/20 1522    Clinical Impression Statement Patient reports that she started having left heel and plantar pain over 6 weeks ago, she reports that she was having significant pain and could barely walk, she was given some stretches and pain medication and she reports that she is  feeling somewhat better but still having pain.  She is very tight in the calves with decresed DF, she is very tight and tender in the elft plantar aspect of the foot along the PF to the heel origin.  She is someone that typically overdoes it and so I cautioned her on over stretching    Stability/Clinical Decision Making Stable/Uncomplicated    Clinical Decision Making Low    Rehab Potential Good    PT Frequency 2x / week    PT Duration 6 weeks    PT Treatment/Interventions ADLs/Self Care Home Management;Cryotherapy;Electrical Stimulation;Iontophoresis 4mg /ml Dexamethasone;Ultrasound;Gait training;Neuromuscular re-education;Balance training;Therapeutic exercise;Therapeutic activities;Functional mobility training;Stair training;Patient/family education;Manual techniques    PT Next Visit Plan slowly start activities and focus on the flexibility, educate on orhtotics    Consulted and Agree with Plan of Care Patient           Patient will benefit from skilled therapeutic intervention in order to improve the following deficits and impairments:  Abnormal gait,Decreased range of motion,Difficulty walking,Increased fascial restricitons,Pain,Decreased balance,Impaired flexibility,Decreased strength  Visit Diagnosis: Pain in left foot - Plan: PT plan of care cert/re-cert  Stiffness of left foot, not elsewhere classified - Plan: PT plan of care cert/re-cert  Difficulty in walking, not elsewhere classified - Plan: PT plan of care cert/re-cert     Problem List Patient Active Problem List   Diagnosis Date Noted  . Palpitations 04/15/2020  . Plantar fasciitis 04/15/2020  . Dysthymia 04/15/2020  . Vitamin D deficiency 04/15/2020  . Gastroesophageal reflux disease 04/15/2020  . Insect bite of left foot 11/24/2019  . Pustule 11/24/2019  . Chronic pain of right thumb 03/09/2019  . External hemorrhoids 03/03/2018  . Other chest pain 02/23/2018  . Insomnia 02/24/2017  . Healthcare maintenance  02/24/2017  . Conjunctivitis 03/01/2014  . Seborrheic keratosis, inflamed 01/05/2012  . Postmenopausal disorder 12/22/2011  . Skin cancer 07/14/2011  . Migraine 05/14/2007  . IRRITABLE BOWEL SYNDROME 05/14/2007  . History of mitral valve prolapse 05/14/2007  . Allergic rhinitis 04/07/2007  . DERMATITIS DUE TO SOLAR RADIATION 04/07/2007  . Premature beat 09/20/2006  . DIVERTICULOSIS, COLON 04/13/2002    Sumner Boast., PT 07/02/2020, 3:30 PM  [place]. [place], [place], [address] Phone: [phone]   Fax:  [phone]  Name: [name] MRN: [id number] Date of Birth: [birth year]

## 2020-07-03 DIAGNOSIS — H43821 Vitreomacular adhesion, right eye: Secondary | ICD-10-CM | POA: Diagnosis not present

## 2020-07-03 DIAGNOSIS — H0288A Meibomian gland dysfunction right eye, upper and lower eyelids: Secondary | ICD-10-CM | POA: Diagnosis not present

## 2020-07-03 DIAGNOSIS — L719 Rosacea, unspecified: Secondary | ICD-10-CM | POA: Diagnosis not present

## 2020-07-03 DIAGNOSIS — H0288B Meibomian gland dysfunction left eye, upper and lower eyelids: Secondary | ICD-10-CM | POA: Diagnosis not present

## 2020-07-03 DIAGNOSIS — H43811 Vitreous degeneration, right eye: Secondary | ICD-10-CM | POA: Diagnosis not present

## 2020-07-05 ENCOUNTER — Other Ambulatory Visit: Payer: Self-pay

## 2020-07-05 ENCOUNTER — Encounter: Payer: Self-pay | Admitting: Physical Therapy

## 2020-07-05 ENCOUNTER — Ambulatory Visit: Payer: Medicare PPO | Admitting: Physical Therapy

## 2020-07-05 DIAGNOSIS — R262 Difficulty in walking, not elsewhere classified: Secondary | ICD-10-CM | POA: Diagnosis not present

## 2020-07-05 DIAGNOSIS — M25675 Stiffness of left foot, not elsewhere classified: Secondary | ICD-10-CM

## 2020-07-05 DIAGNOSIS — M79672 Pain in left foot: Secondary | ICD-10-CM

## 2020-07-05 NOTE — Therapy (Signed)
[place]. [place], [place], [address] Phone: [phone]   Fax:  [phone]  Physical Therapy Treatment  Patient Details  Name: [name] MRN: [id number] Date of Birth: September 27, 1950 Referring Provider (PT): Kremmer   Encounter Date: 07/05/2020   PT End of Session - 07/05/20 0841    Visit Number 2    Number of Visits 12    Date for PT Re-Evaluation 08/16/20    Authorization Type [employer]    PT Start Time 0758    PT Stop Time 0842    PT Time Calculation (min) 44 min    Activity Tolerance Patient tolerated treatment well    Behavior During Therapy [facility] for tasks assessed/performed           Past Medical History:  Diagnosis Date  . Anal fissure   . Anemia    during pregnancy  . Anxiety   . Basal cell carcinoma   . DDD (degenerative disc disease)   . Diverticulosis   . Hypokalemia   . LBP (low back pain)   . Melanoma ([place])   . Migraine   . MVP (mitral valve prolapse) 1990   psa  . Rosacea   . Ulcer    as a child    Past Surgical History:  Procedure Laterality Date  . ABDOMINAL HYSTERECTOMY  1985  . BREAST BIOPSY Left    secondary to infection  . I & D EXTREMITY Right 09/09/2012   Procedure: Minor IRRIGATION AND DEBRIDEMENT EXTREMITY paronychia of right thumb;  Surgeon: Wynonia Sours, MD;  Location: [place];  Service: Orthopedics;  Laterality: Right;  . I & D EXTREMITY Left 01/26/2014   Procedure: MINOR IINCISION AND DRAINAGE paronychia left index finger;  Surgeon: Daryll Brod, MD;  Location: [place];  Service: Orthopedics;  Laterality: Left;  left index  . OOPHORECTOMY Left 1994  . REPAIR KNEE LIGAMENT Left 1989  . TONSILLECTOMY  1955  . TUBAL LIGATION    . VEIN SURGERY      There were no vitals filed for this visit.   Subjective Assessment - 07/05/20 0839    Subjective Patient reports a little better, still hurting    Currently in Pain? Yes    Pain  Score 2     Pain Location Heel    Pain Orientation Left                             [facility] Adult PT Treatment/Exercise - 07/05/20 0001      Exercises   Exercises Ankle      Iontophoresis   Type of Iontophoresis Dexamethasone    Location left PF origin    Dose 16m    Time 4hour patch      Manual Therapy   Manual Therapy Soft tissue mobilization    Manual therapy comments toe stretches into flexion and extension    Soft tissue mobilization to the left plantar area      Ankle Exercises: Stretches   Plantar Fascia Stretch 4 reps;20 seconds    Soleus Stretch 3 reps;20 seconds    Gastroc Stretch 4 reps;20 seconds      Ankle Exercises: Seated   ABC's 1 rep    Marble Pickup 30x    Other Seated Ankle Exercises on dyna disc all ankle motions with a lot of cues to move the ankle and not the knee and th  ehip                    PT Short Term Goals - 07/05/20 0843      PT SHORT TERM GOAL #1   Title independent with initial HEP    Status Partially Met             PT Long Term Goals - 07/02/20 1526      PT LONG TERM GOAL #1   Title decrease pain 50%    Time 8    Period Weeks    Status New      PT LONG TERM GOAL #2   Title increase DF to 10 degrees    Time 8    Period Weeks    Status New      PT LONG TERM GOAL #3   Title independent with RICE    Time 8    Period Weeks      PT LONG TERM GOAL #4   Title no difficulty walking    Time 8    Period Weeks    Status New                 Plan - 07/05/20 8757    Clinical Impression Statement Patient with very stiff toes, unable to do the towel pickup and scrunches, she did have difficulty with the control of the ankle for more proprioceptive activities, very shaky.  Calves are very tight    PT Next Visit Plan slowly start activities and focus on the flexibility, educate on orhtotics    Consulted and Agree with Plan of Care Patient           Patient will benefit from skilled  therapeutic intervention in order to improve the following deficits and impairments:  Abnormal gait,Decreased range of motion,Difficulty walking,Increased fascial restricitons,Pain,Decreased balance,Impaired flexibility,Decreased strength  Visit Diagnosis: Pain in left foot  Stiffness of left foot, not elsewhere classified  Difficulty in walking, not elsewhere classified     Problem List Patient Active Problem List   Diagnosis Date Noted  . Palpitations 04/15/2020  . Plantar fasciitis 04/15/2020  . Dysthymia 04/15/2020  . Vitamin D deficiency 04/15/2020  . Gastroesophageal reflux disease 04/15/2020  . Insect bite of left foot 11/24/2019  . Pustule 11/24/2019  . Chronic pain of right thumb 03/09/2019  . External hemorrhoids 03/03/2018  . Other chest pain 02/23/2018  . Insomnia 02/24/2017  . Healthcare maintenance 02/24/2017  . Conjunctivitis 03/01/2014  . Seborrheic keratosis, inflamed 01/05/2012  . Postmenopausal disorder 12/22/2011  . Skin cancer 07/14/2011  . Migraine 05/14/2007  . IRRITABLE BOWEL SYNDROME 05/14/2007  . History of mitral valve prolapse 05/14/2007  . Allergic rhinitis 04/07/2007  . DERMATITIS DUE TO SOLAR RADIATION 04/07/2007  . Premature beat 09/20/2006  . DIVERTICULOSIS, COLON 04/13/2002    Sumner Boast., PT 07/05/2020, 8:44 AM  [facility] [address]. [place], [place], [address] Phone: [phone]   Fax:  [phone]  Name: [name] MRN: [id number] Date of Birth: [birth year]

## 2020-07-10 ENCOUNTER — Encounter: Payer: Self-pay | Admitting: Physical Therapy

## 2020-07-10 ENCOUNTER — Ambulatory Visit: Payer: Medicare PPO | Attending: Family Medicine | Admitting: Physical Therapy

## 2020-07-10 ENCOUNTER — Other Ambulatory Visit: Payer: Self-pay

## 2020-07-10 DIAGNOSIS — M25675 Stiffness of left foot, not elsewhere classified: Secondary | ICD-10-CM | POA: Diagnosis not present

## 2020-07-10 DIAGNOSIS — M79672 Pain in left foot: Secondary | ICD-10-CM

## 2020-07-10 DIAGNOSIS — R262 Difficulty in walking, not elsewhere classified: Secondary | ICD-10-CM | POA: Diagnosis not present

## 2020-07-10 NOTE — Therapy (Signed)
[facility]. [place], [place], [address] Phone: [phone]   Fax:  [phone]  Physical Therapy Treatment  Patient Details  Name: [name] MRN: [id number] Date of Birth: 02/09/1951 Referring Provider (PT): Kremmer   Encounter Date: 07/10/2020   PT End of Session - 07/10/20 1522    Visit Number 3    Number of Visits 12    Date for PT Re-Evaluation 08/16/20    Authorization Type [employer]    PT Start Time 4332    PT Stop Time 1524    PT Time Calculation (min) 40 min    Activity Tolerance Patient tolerated treatment well    Behavior During Therapy [facility] for tasks assessed/performed           Past Medical History:  Diagnosis Date  . Anal fissure   . Anemia    during pregnancy  . Anxiety   . Basal cell carcinoma   . DDD (degenerative disc disease)   . Diverticulosis   . Hypokalemia   . LBP (low back pain)   . Melanoma ([place])   . Migraine   . MVP (mitral valve prolapse) 1990   psa  . Rosacea   . Ulcer    as a child    Past Surgical History:  Procedure Laterality Date  . ABDOMINAL HYSTERECTOMY  1985  . BREAST BIOPSY Left    secondary to infection  . I & D EXTREMITY Right 09/09/2012   Procedure: Minor IRRIGATION AND DEBRIDEMENT EXTREMITY paronychia of right thumb;  Surgeon: Wynonia Sours, MD;  Location: [place];  Service: Orthopedics;  Laterality: Right;  . I & D EXTREMITY Left 01/26/2014   Procedure: MINOR IINCISION AND DRAINAGE paronychia left index finger;  Surgeon: Daryll Brod, MD;  Location: [place];  Service: Orthopedics;  Laterality: Left;  left index  . OOPHORECTOMY Left 1994  . REPAIR KNEE LIGAMENT Left 1989  . TONSILLECTOMY  1955  . TUBAL LIGATION    . VEIN SURGERY      There were no vitals filed for this visit.   Subjective Assessment - 07/10/20 1449    Subjective I have had some stiffness in the toes and cramping in the left foot    Currently in  Pain? Yes    Pain Score 3     Pain Location Foot    Pain Orientation Left    Aggravating Factors  walking                             [facility] Adult PT Treatment/Exercise - 07/10/20 0001      Iontophoresis   Type of Iontophoresis Dexamethasone    Location left PF origin    Dose 53mA    Time 4hour patch      Manual Therapy   Manual Therapy Soft tissue mobilization;Joint mobilization    Manual therapy comments toe stretches into flexion and extension    Joint Mobilization Mid foot joint mobs stiff mid foot    Soft tissue mobilization to the left plantar area      Ankle Exercises: Aerobic   Stationary Bike level 2 x 5 minutes      Ankle Exercises: Stretches   Plantar Fascia Stretch 4 reps;20 seconds    Soleus Stretch 3 reps;20 seconds    Gastroc Stretch 4 reps;20 seconds      Ankle Exercises: Seated   ABC's 1 rep  Marble Pickup 30x    Other Seated Ankle Exercises on dyna disc all ankle motions with a lot of cues to move the ankle and not the knee and th ehip                    PT Short Term Goals - 07/10/20 1524      PT SHORT TERM GOAL #1   Title independent with initial HEP    Status Achieved             PT Long Term Goals - 07/10/20 1524      PT LONG TERM GOAL #1   Title decrease pain 50%    Status On-going      PT LONG TERM GOAL #2   Title increase DF to 10 degrees    Status On-going      PT LONG TERM GOAL #3   Title independent with RICE    Status Achieved                 Plan - 07/10/20 1523    Clinical Impression Statement Patient reports tha she is doing a little better, c/o more mid foot dorsal of foot today, with the stiff toes she does have some stiffness in the mid foot, did spme joint mobilizations on this today  3rd ionto patch given    PT Next Visit Plan work on flexibility    Consulted and Agree with Plan of Care Patient           Patient will benefit from skilled therapeutic intervention in order  to improve the following deficits and impairments:  Abnormal gait,Decreased range of motion,Difficulty walking,Increased fascial restricitons,Pain,Decreased balance,Impaired flexibility,Decreased strength  Visit Diagnosis: Pain in left foot  Stiffness of left foot, not elsewhere classified  Difficulty in walking, not elsewhere classified     Problem List Patient Active Problem List   Diagnosis Date Noted  . Palpitations 04/15/2020  . Plantar fasciitis 04/15/2020  . Dysthymia 04/15/2020  . Vitamin D deficiency 04/15/2020  . Gastroesophageal reflux disease 04/15/2020  . Insect bite of left foot 11/24/2019  . Pustule 11/24/2019  . Chronic pain of right thumb 03/09/2019  . External hemorrhoids 03/03/2018  . Other chest pain 02/23/2018  . Insomnia 02/24/2017  . Healthcare maintenance 02/24/2017  . Conjunctivitis 03/01/2014  . Seborrheic keratosis, inflamed 01/05/2012  . Postmenopausal disorder 12/22/2011  . Skin cancer 07/14/2011  . Migraine 05/14/2007  . IRRITABLE BOWEL SYNDROME 05/14/2007  . History of mitral valve prolapse 05/14/2007  . Allergic rhinitis 04/07/2007  . DERMATITIS DUE TO SOLAR RADIATION 04/07/2007  . Premature beat 09/20/2006  . DIVERTICULOSIS, COLON 04/13/2002    Sumner Boast., PT 07/10/2020, 3:25 PM  [address]. [place], [place], [address] Phone: [phone]   Fax:  [phone]  Name: [name] MRN: [id number] Date of Birth: 09-05-1950

## 2020-07-19 ENCOUNTER — Encounter: Payer: Self-pay | Admitting: Plastic Surgery

## 2020-07-19 ENCOUNTER — Ambulatory Visit (INDEPENDENT_AMBULATORY_CARE_PROVIDER_SITE_OTHER): Payer: Medicare PPO | Admitting: Plastic Surgery

## 2020-07-19 ENCOUNTER — Encounter: Payer: Self-pay | Admitting: Physical Therapy

## 2020-07-19 ENCOUNTER — Ambulatory Visit: Payer: Medicare PPO | Admitting: Physical Therapy

## 2020-07-19 ENCOUNTER — Other Ambulatory Visit: Payer: Self-pay

## 2020-07-19 DIAGNOSIS — M25675 Stiffness of left foot, not elsewhere classified: Secondary | ICD-10-CM

## 2020-07-19 DIAGNOSIS — M79672 Pain in left foot: Secondary | ICD-10-CM | POA: Diagnosis not present

## 2020-07-19 DIAGNOSIS — R262 Difficulty in walking, not elsewhere classified: Secondary | ICD-10-CM

## 2020-07-19 DIAGNOSIS — Z719 Counseling, unspecified: Secondary | ICD-10-CM

## 2020-07-19 NOTE — Therapy (Signed)
[facility]. [place], [place], [address] Phone: [phone]   Fax:  [phone]  Physical Therapy Treatment  Patient Details  Name: [name] MRN: [id number] Date of Birth: 1950-11-27 Referring Provider (PT): Kremmer   Encounter Date: 07/19/2020   PT End of Session - 07/19/20 1043     Visit Number 4    Number of Visits 12    Date for PT Re-Evaluation 08/16/20    Authorization Type [employer]    PT Start Time 1540    PT Stop Time 1100    PT Time Calculation (min) 45 min    Activity Tolerance Patient tolerated treatment well    Behavior During Therapy WFL for tasks assessed/performed             Past Medical History:  Diagnosis Date   Anal fissure    Anemia    during pregnancy   Anxiety    Basal cell carcinoma    DDD (degenerative disc disease)    Diverticulosis    Hypokalemia    LBP (low back pain)    Melanoma (HCC)    Migraine    MVP (mitral valve prolapse) 1990   psa   Rosacea    Ulcer    as a child    Past Surgical History:  Procedure Laterality Date   ABDOMINAL HYSTERECTOMY  1985   BREAST BIOPSY Left    secondary to infection   I & D EXTREMITY Right 09/09/2012   Procedure: Minor IRRIGATION AND DEBRIDEMENT EXTREMITY paronychia of right thumb;  Surgeon: Wynonia Sours, MD;  Location: [facility];  Service: Orthopedics;  Laterality: Right;   I & D EXTREMITY Left 01/26/2014   Procedure: MINOR IINCISION AND DRAINAGE paronychia left index finger;  Surgeon: Daryll Brod, MD;  Location: [facility];  Service: Orthopedics;  Laterality: Left;  left index   OOPHORECTOMY Left 1994   REPAIR KNEE LIGAMENT Left 1989   TONSILLECTOMY  1955   TUBAL LIGATION     VEIN SURGERY      There were no vitals filed for this visit.   Subjective Assessment - 07/19/20 1040     Subjective Patient reports that she wanted a clean foot prior to coming in and she had her foot in the sink and  fell on her right side, she repors right elbow hip and neck pain, she is limping more, she reports that over the weekend she stood in flip flops for 9 hours,    Currently in Pain? Yes    Pain Score 6     Pain Location Foot    Pain Orientation Left    Aggravating Factors  standing 9 hours in flip flops and falling                               [facility] Adult PT Treatment/Exercise - 07/19/20 0001       Manual Therapy   Manual Therapy Soft tissue mobilization;Joint mobilization    Manual therapy comments toe stretches into flexion and extension    Joint Mobilization Mid foot joint mobs stiff mid foot    Soft tissue mobilization to the left plantar area      Ankle Exercises: Stretches   Plantar Fascia Stretch 4 reps;20 seconds    Soleus Stretch 3 reps;20 seconds    Gastroc Stretch 4 reps;20 seconds      Ankle Exercises: Seated  ABC's 1 rep                      PT Short Term Goals - 07/10/20 1524       PT SHORT TERM GOAL #1   Title independent with initial HEP    Status Achieved               PT Long Term Goals - 07/19/20 1045       PT LONG TERM GOAL #1   Title decrease pain 50%    Status On-going      PT LONG TERM GOAL #2   Title increase DF to 10 degrees    Status On-going                   Plan - 07/19/20 1043     Clinical Impression Statement Patient had a set back by wearing flip flops to a wedding and standing 9 hours she then had a fall this AM.  She is limping more and reports sore in the elbow and the hip and neck.  Pretty significant sit back, I did talk with her at length about sensible shoes with support, really discouraged the flip flops             Patient will benefit from skilled therapeutic intervention in order to improve the following deficits and impairments:  Abnormal gait, Decreased range of motion, Difficulty walking, Increased fascial restricitons, Pain, Decreased balance, Impaired flexibility,  Decreased strength  Visit Diagnosis: Pain in left foot  Stiffness of left foot, not elsewhere classified  Difficulty in walking, not elsewhere classified     Problem List Patient Active Problem List   Diagnosis Date Noted   Encounter for counseling 07/19/2020   Palpitations 04/15/2020   Plantar fasciitis 04/15/2020   Dysthymia 04/15/2020   Vitamin D deficiency 04/15/2020   Gastroesophageal reflux disease 04/15/2020   Insect bite of left foot 11/24/2019   Pustule 11/24/2019   Chronic pain of right thumb 03/09/2019   External hemorrhoids 03/03/2018   Other chest pain 02/23/2018   Insomnia 02/24/2017   Healthcare maintenance 02/24/2017   Conjunctivitis 03/01/2014   Seborrheic keratosis, inflamed 01/05/2012   Postmenopausal disorder 12/22/2011   Skin cancer 07/14/2011   Migraine 05/14/2007   IRRITABLE BOWEL SYNDROME 05/14/2007   History of mitral valve prolapse 05/14/2007   Allergic rhinitis 04/07/2007   DERMATITIS DUE TO SOLAR RADIATION 04/07/2007   Premature beat 09/20/2006   DIVERTICULOSIS, COLON 04/13/2002    Sumner Boast., PT 07/19/2020, 10:45 AM  [facility]. [place], [place], [address] Phone: [phone]   Fax:  [phone]  Name: [name] MRN: [id number] Date of Birth: 1950/10/13

## 2020-07-19 NOTE — Progress Notes (Signed)
The patient is a 70 year old female here for evaluation of her face.  We talked about different methods for facial rejuvenation.  She has decided on the site time and halo.  I think this is a nice choice for her.  The gold standard would certainly be a facelift but she does not want to go through that anesthetic exposure and I think that is reasonable.  She is going to get a quote and set up for the laser treatment.  I will need to call in Valtrex for her and lidocaine before she has the procedure.  She will let us know when she would like to get it scheduled.

## 2020-07-26 ENCOUNTER — Encounter: Payer: Self-pay | Admitting: Physical Therapy

## 2020-07-26 ENCOUNTER — Other Ambulatory Visit: Payer: Self-pay

## 2020-07-26 ENCOUNTER — Ambulatory Visit: Payer: Medicare PPO | Admitting: Physical Therapy

## 2020-07-26 DIAGNOSIS — R262 Difficulty in walking, not elsewhere classified: Secondary | ICD-10-CM

## 2020-07-26 DIAGNOSIS — M25675 Stiffness of left foot, not elsewhere classified: Secondary | ICD-10-CM | POA: Diagnosis not present

## 2020-07-26 DIAGNOSIS — M79672 Pain in left foot: Secondary | ICD-10-CM | POA: Diagnosis not present

## 2020-07-26 NOTE — Therapy (Signed)
[facility]. [place], [place], [address] Phone: [phone]   Fax:  [phone]  Physical Therapy Treatment  Patient Details  Name: [name] MRN: [id number] Date of Birth: 16-May-1950 Referring Provider (PT): Kremmer   Encounter Date: 07/26/2020   PT End of Session - 07/26/20 0917     Visit Number 5    Number of Visits 12    Date for PT Re-Evaluation 08/16/20    Authorization Type [employer]    PT Start Time 0840    PT Stop Time 0922    PT Time Calculation (min) 42 min    Activity Tolerance Patient tolerated treatment well    Behavior During Therapy WFL for tasks assessed/performed             Past Medical History:  Diagnosis Date   Anal fissure    Anemia    during pregnancy   Anxiety    Basal cell carcinoma    DDD (degenerative disc disease)    Diverticulosis    Hypokalemia    LBP (low back pain)    Melanoma (HCC)    Migraine    MVP (mitral valve prolapse) 1990   psa   Rosacea    Ulcer    as a child    Past Surgical History:  Procedure Laterality Date   ABDOMINAL HYSTERECTOMY  1985   BREAST BIOPSY Left    secondary to infection   I & D EXTREMITY Right 09/09/2012   Procedure: Minor IRRIGATION AND DEBRIDEMENT EXTREMITY paronychia of right thumb;  Surgeon: Wynonia Sours, MD;  Location: [facility];  Service: Orthopedics;  Laterality: Right;   I & D EXTREMITY Left 01/26/2014   Procedure: MINOR IINCISION AND DRAINAGE paronychia left index finger;  Surgeon: Daryll Brod, MD;  Location: [facility];  Service: Orthopedics;  Laterality: Left;  left index   OOPHORECTOMY Left 1994   REPAIR KNEE LIGAMENT Left 1989   TONSILLECTOMY  1955   TUBAL LIGATION     VEIN SURGERY      There were no vitals filed for this visit.   Subjective Assessment - 07/26/20 0847     Subjective Patient reports that she went to the beach last week. reports wore good shoes all week, thinks that  it is getting better.    Currently in Pain? Yes    Pain Score 3     Pain Location Foot    Pain Orientation Left    Pain Descriptors / Indicators Sore    Pain Relieving Factors good shoes                               [facility] Adult PT Treatment/Exercise - 07/26/20 0001       Iontophoresis   Type of Iontophoresis Dexamethasone    Location left PF origin    Dose 63m    Time 4hour patch      Manual Therapy   Manual Therapy Soft tissue mobilization;Joint mobilization    Manual therapy comments toe stretches into flexion and extension    Joint Mobilization Mid foot joint mobs stiff mid foot    Soft tissue mobilization to the left plantar area      Ankle Exercises: Aerobic   Recumbent Bike level 2 x 6 minutes      Ankle Exercises: Seated   [place] Pickup 30x      Ankle Exercises: Stretches  Plantar Fascia Stretch 4 reps;20 seconds    Soleus Stretch 3 reps;20 seconds    Gastroc Stretch 4 reps;20 seconds                      PT Short Term Goals - 07/10/20 1524       PT SHORT TERM GOAL #1   Title independent with initial HEP    Status Achieved               PT Long Term Goals - 07/26/20 0919       PT LONG TERM GOAL #1   Title decrease pain 50%    Status Partially Met      PT LONG TERM GOAL #2   Title increase DF to 10 degrees    Status Partially Met      PT LONG TERM GOAL #3   Title independent with RICE    Status Achieved      PT LONG TERM GOAL #4   Title no difficulty walking    Status Partially Met                   Plan - 07/26/20 0917     Clinical Impression Statement Patient reports doing her stretches and wearing good shoes the past week, reports overall less pain, still tight with stretches and tender with STM.  Was able to go to the beach without set backs    PT Next Visit Plan continue to work out the Plantar fascia flexibility and calf flexibility    Consulted and Agree with Plan of Care Patient              Patient will benefit from skilled therapeutic intervention in order to improve the following deficits and impairments:  Abnormal gait, Decreased range of motion, Difficulty walking, Increased fascial restricitons, Pain, Decreased balance, Impaired flexibility, Decreased strength  Visit Diagnosis: Pain in left foot  Stiffness of left foot, not elsewhere classified  Difficulty in walking, not elsewhere classified     Problem List Patient Active Problem List   Diagnosis Date Noted   Encounter for counseling 07/19/2020   Palpitations 04/15/2020   Plantar fasciitis 04/15/2020   Dysthymia 04/15/2020   Vitamin D deficiency 04/15/2020   Gastroesophageal reflux disease 04/15/2020   Insect bite of left foot 11/24/2019   Pustule 11/24/2019   Chronic pain of right thumb 03/09/2019   External hemorrhoids 03/03/2018   Other chest pain 02/23/2018   Insomnia 02/24/2017   Healthcare maintenance 02/24/2017   Conjunctivitis 03/01/2014   Seborrheic keratosis, inflamed 01/05/2012   Postmenopausal disorder 12/22/2011   Skin cancer 07/14/2011   Migraine 05/14/2007   IRRITABLE BOWEL SYNDROME 05/14/2007   History of mitral valve prolapse 05/14/2007   Allergic rhinitis 04/07/2007   DERMATITIS DUE TO SOLAR RADIATION 04/07/2007   Premature beat 09/20/2006   DIVERTICULOSIS, COLON 04/13/2002    Sumner Boast., PT 07/26/2020, 9:20 AM  [facility]. [place], [place], [address] Phone: [phone]   Fax:  [phone]  Name: [name] MRN: [id number] Date of Birth: 04/24/1950

## 2020-07-29 DIAGNOSIS — F331 Major depressive disorder, recurrent, moderate: Secondary | ICD-10-CM | POA: Diagnosis not present

## 2020-07-30 ENCOUNTER — Other Ambulatory Visit: Payer: Self-pay

## 2020-07-30 ENCOUNTER — Encounter: Payer: Self-pay | Admitting: Family Medicine

## 2020-07-30 ENCOUNTER — Ambulatory Visit: Payer: Medicare PPO | Admitting: Family Medicine

## 2020-07-30 VITALS — BP 120/68 | HR 73 | Temp 96.8°F | Ht 61.0 in | Wt 114.0 lb

## 2020-07-30 DIAGNOSIS — H8309 Labyrinthitis, unspecified ear: Secondary | ICD-10-CM

## 2020-07-30 NOTE — Progress Notes (Addendum)
Established Patient Office Visit  Subjective:  Patient ID: [name], female    DOB: 08-31-1950  Age: 70 y.o. MRN: [id number]  CC:  Chief Complaint  Patient presents with   Dizziness    Dizzy spells that come and go symptoms x 1 week.     HPI [name] presents for week long history of increased dizziness described as a spinning sensation after change in position.  Prior to this she describes losing her balance and falling and landing on her right shoulder.  There was noted direct head injury but it was a definite jolt.  She has a history of benign positional vertigo.  She knows exercises to do for it but was not sure that would work in this case.  She denies a change in her hearing.  History of migraine headaches and their pattern has not changed.  She does admit that there was increased stress prior to the onset of her symptoms.  She had not been sleeping well a few days prior.  She was at the beach with her husband, invalid father and his caregiver.  She felt a little frustrated with her father's caregiver and felt as though she personally had to do more for his care.  She continues talking therapy and it has been helpful.  Patient worries that this may be a brain tumor.  Past Medical History:  Diagnosis Date   Anal fissure    Anemia    during pregnancy   Anxiety    Basal cell carcinoma    DDD (degenerative disc disease)    Diverticulosis    Hypokalemia    LBP (low back pain)    Melanoma (HCC)    Migraine    MVP (mitral valve prolapse) 1990   psa   Rosacea    Ulcer    as a child    Past Surgical History:  Procedure Laterality Date   ABDOMINAL HYSTERECTOMY  1985   BREAST BIOPSY Left    secondary to infection   I & D EXTREMITY Right 09/09/2012   Procedure: Minor IRRIGATION AND DEBRIDEMENT EXTREMITY paronychia of right thumb;  Surgeon: Wynonia Sours, MD;  Location: [facility];  Service: Orthopedics;  Laterality: Right;   I & D EXTREMITY Left  01/26/2014   Procedure: MINOR IINCISION AND DRAINAGE paronychia left index finger;  Surgeon: Daryll Brod, MD;  Location: [facility];  Service: Orthopedics;  Laterality: Left;  left index   OOPHORECTOMY Left 1994   REPAIR KNEE LIGAMENT Left 1989   TONSILLECTOMY  1955   TUBAL LIGATION     VEIN SURGERY      Family History  Problem Relation Age of Onset   Heart disease Mother    Colon polyps Mother    Heart disease Father    Colitis Father    Colon polyps Father    Esophageal cancer Maternal Aunt    Colon cancer Neg Hx     Social History   Socioeconomic History   Marital status: Married    Spouse name: Not on file   Number of children: 2   Years of education: Not on file   Highest education level: Not on file  Occupational History   Occupation: retired school system  Tobacco Use   Smoking status: Never   Smokeless tobacco: Never  Vaping Use   Vaping Use: Never used  Substance and Sexual Activity   Alcohol use: Yes    Alcohol/week: 1.0 standard drink  Types: 1 Standard drinks or equivalent per week    Comment: social wine   Drug use: No   Sexual activity: Yes  Other Topics Concern   Not on file  Social History Narrative   Not on file   Social Determinants of Health   Financial Resource Strain: Low Risk    Difficulty of Paying Living Expenses: Not hard at all  Food Insecurity: No Food Insecurity   Worried About Charity fundraiser in the Last Year: Never true   [place] in the Last Year: Never true  Transportation Needs: No Transportation Needs   Lack of Transportation (Medical): No   Lack of Transportation (Non-Medical): No  Physical Activity: Sufficiently Active   Days of Exercise per Week: 4 days   Minutes of Exercise per Session: 40 min  Stress: No Stress Concern Present   Feeling of Stress : Only a little  Social Connections: Engineer, building services of Communication with Friends and Family: More than three times a week    Frequency of Social Gatherings with Friends and Family: Once a week   Attends Religious Services: More than 4 times per year   Active Member of [employer] or Organizations: Yes   Attends Music therapist: More than 4 times per year   Marital Status: Married  Human resources officer Violence: Not At Risk   Fear of Current or Ex-Partner: No   Emotionally Abused: No   Physically Abused: No   Sexually Abused: No    Outpatient Medications Prior to Visit  Medication Sig Dispense Refill   atenolol (TENORMIN) 25 MG tablet Take 0.5 tablets (12.5 mg total) by mouth daily as needed (for palpitations). 15 tablet 0   calcium-vitamin D 250-100 MG-UNIT per tablet Take 1 tablet by mouth daily.     docusate sodium (COLACE) 100 MG capsule Take 100 mg by mouth daily. Daily at bedtime      doxycycline (VIBRA-TABS) 100 MG tablet Take 1 tablet (100 mg total) by mouth 2 (two) times daily. (Patient taking differently: Take 100 mg by mouth as needed.) 20 tablet 0   estradiol (ESTRACE) 0.5 MG tablet Take 0.5 mg by mouth daily.     hydrocortisone (ANUSOL-HC) 2.5 % rectal cream Place 1 application rectally 2 (two) times daily. 30 g 0   meclizine (ANTIVERT) 12.5 MG tablet meclizine 12.5 mg tablet  one tablet every 6-12 hours as needed for dizziness     meloxicam (MOBIC) 15 MG tablet Take 1 tablet (15 mg total) by mouth as needed. 30 tablet 1   Multiple Vitamin (MULTIVITAMIN) tablet Take 1 tablet by mouth daily.     NON FORMULARY Hydro eye     omeprazole (PRILOSEC) 20 MG capsule Take 1 capsule (20 mg total) by mouth daily. AS NEEDED (Patient taking differently: Take 20 mg by mouth as needed. AS NEEDED) 100 capsule 4   zolmitriptan (ZOMIG-ZMT) 2.5 MG disintegrating tablet TAKE ONE TABLET BY MOUTH AS NEEDED FOR MIGRAINE 10 tablet 4   predniSONE (DELTASONE) 5 MG tablet prednisone 5 mg tablet  TAKE 1 TABLET BY MOUTH THREE TIMES DAILY FOR 2 DAYS THEN TAKE 1 TABLET TWICE DAILY FOR 5 DAYS THEN TAKE 1 TABLET EVERY DAY UNTIL  ALL TAKEN (Patient not taking: Reported on 07/30/2020)     No facility-administered medications prior to visit.    Allergies  Allergen Reactions   Codeine    Compazine    Gabapentin    Prednisone     REACTION:  makes heart beat hard \\T \ fast   Prochlorperazine Edisylate    Sulfa Antibiotics    Sulfonamide Derivatives     ROS Review of Systems  Constitutional: Negative.   HENT: Negative.  Negative for hearing loss.   Respiratory: Negative.    Cardiovascular: Negative.   Gastrointestinal: Negative.   Neurological:  Positive for dizziness and headaches. Negative for speech difficulty, weakness, light-headedness and numbness.  Psychiatric/Behavioral:  Positive for dysphoric mood.      Objective:    Physical Exam Vitals and nursing note reviewed.  Constitutional:      General: She is not in acute distress.    Appearance: Normal appearance. She is normal weight. She is not ill-appearing, toxic-appearing or diaphoretic.  HENT:     Head: Normocephalic and atraumatic.     Right Ear: Tympanic membrane, ear canal and external ear normal.     Left Ear: Tympanic membrane, ear canal and external ear normal.     Mouth/Throat:     Mouth: Mucous membranes are moist.     Pharynx: Oropharynx is clear. No oropharyngeal exudate or posterior oropharyngeal erythema.  Eyes:     General:        Right eye: No discharge.        Left eye: No discharge.     Extraocular Movements: Extraocular movements intact.     Conjunctiva/sclera: Conjunctivae normal.     Pupils: Pupils are equal, round, and reactive to light.  Neck:     Vascular: No carotid bruit.  Cardiovascular:     Rate and Rhythm: Normal rate and regular rhythm.  Pulmonary:     Effort: Pulmonary effort is normal.     Breath sounds: Normal breath sounds.  Abdominal:     General: Bowel sounds are normal.  Musculoskeletal:     Cervical back: No rigidity or tenderness.  Lymphadenopathy:     Cervical: No cervical adenopathy.  Skin:     General: Skin is warm and dry.  Neurological:     General: No focal deficit present.     Mental Status: She is alert and oriented to person, place, and time.     Cranial Nerves: No cranial nerve deficit.  Psychiatric:        Mood and Affect: Mood normal.        Behavior: Behavior normal.    BP 120/68   Pulse 73   Temp (!) 96.8 F (36 C) (Temporal)   Ht 5\' 1"  (1.549 m)   Wt 114 lb (51.7 kg)   SpO2 99%   BMI 21.54 kg/m  Wt Readings from Last 3 Encounters:  07/30/20 114 lb (51.7 kg)  06/07/20 115 lb 6.4 oz (52.3 kg)  05/20/20 116 lb 3.2 oz (52.7 kg)     Health Maintenance Due  Topic Date Due   Zoster Vaccines- Shingrix (1 of 2) Never done    There are no preventive care reminders to display for this patient.  Lab Results  Component Value Date   TSH 1.14 04/15/2020   Lab Results  Component Value Date   WBC 9.4 04/15/2020   HGB 13.6 04/15/2020   HCT 40.0 04/15/2020   MCV 93.9 04/15/2020   PLT 271.0 04/15/2020   Lab Results  Component Value Date   NA 140 04/15/2020   K 4.5 04/15/2020   CO2 31 04/15/2020   GLUCOSE 105 (H) 04/15/2020   BUN 15 04/15/2020   CREATININE 0.69 04/15/2020   BILITOT 0.5 04/15/2020   ALKPHOS 58 04/15/2020  AST 17 04/15/2020   ALT 13 04/15/2020   PROT 7.1 04/15/2020   ALBUMIN 4.3 04/15/2020   CALCIUM 10.1 04/15/2020   ANIONGAP 8 02/22/2018   GFR 87.22 04/15/2020   Lab Results  Component Value Date   CHOL 183 04/15/2020   Lab Results  Component Value Date   HDL 74.90 04/15/2020   Lab Results  Component Value Date   LDLCALC 80 04/15/2020   Lab Results  Component Value Date   TRIG 139.0 04/15/2020   Lab Results  Component Value Date   CHOLHDL 2 04/15/2020   No results found for: HGBA1C    Assessment & Plan:   Problem List Items Addressed This Visit       Nervous and Auditory   Labyrinthitis - Primary    No orders of the defined types were placed in this encounter.   Follow-up: Return if symptoms worsen  or fail to improve, for Let's consider neurology follow up if this does not resolve on it's own. .  Patient given information on labyrinthitis and the Epley maneuvers.  Encouraged her to continue her exercises.  Neurology referral if this does not resolve on its own.  Libby Maw, MD

## 2020-07-31 ENCOUNTER — Telehealth: Payer: Self-pay | Admitting: Family Medicine

## 2020-07-31 NOTE — Telephone Encounter (Signed)
Spoke with patient who states that she was viewing her chart and seen a sentence regarding her father's caregiver that she would like taken out. Per patient the sentence that states that the caregiver was not doing her job she would like removed and did not mean for this to be put in her chart. Please advise.

## 2020-07-31 NOTE — Telephone Encounter (Signed)
Pt is wanting a cb from Dr. Ethelene Hal concerning her dos from yesterday(07/30/20). She feels some information put into her Mychart may hurt someone's feelings if they saw this. She is wanting you to amend the notes from yesterday. She did not want to go into any further details. Please advise pt at [phone].

## 2020-08-01 NOTE — Telephone Encounter (Signed)
Patient is requesting a call back from the nurse. 

## 2020-08-05 ENCOUNTER — Other Ambulatory Visit: Payer: Self-pay

## 2020-08-06 ENCOUNTER — Encounter: Payer: Self-pay | Admitting: Family Medicine

## 2020-08-06 ENCOUNTER — Ambulatory Visit: Payer: Medicare PPO | Admitting: Family Medicine

## 2020-08-06 VITALS — BP 122/64 | HR 90 | Temp 98.1°F | Ht 61.0 in | Wt 116.0 lb

## 2020-08-06 DIAGNOSIS — K589 Irritable bowel syndrome without diarrhea: Secondary | ICD-10-CM | POA: Diagnosis not present

## 2020-08-06 DIAGNOSIS — H8309 Labyrinthitis, unspecified ear: Secondary | ICD-10-CM | POA: Diagnosis not present

## 2020-08-06 NOTE — Progress Notes (Signed)
Established Patient Office Visit  Subjective:  Patient ID: [name], female    DOB: 04-10-1950  Age: 70 y.o. MRN: [id number]  CC:  Chief Complaint  Patient presents with   Diarrhea    Loose stools for three days    HPI [name] presents for follow-up of labyrinthitis seems to be improving some.  Stools have been loose over the last few days.  She has not had no fever nausea or vomiting.  There is been no blood in her stool.  Past Medical History:  Diagnosis Date   Anal fissure    Anemia    during pregnancy   Anxiety    Basal cell carcinoma    DDD (degenerative disc disease)    Diverticulosis    Hypokalemia    LBP (low back pain)    Melanoma (HCC)    Migraine    MVP (mitral valve prolapse) 1990   psa   Rosacea    Ulcer    as a child    Past Surgical History:  Procedure Laterality Date   ABDOMINAL HYSTERECTOMY  1985   BREAST BIOPSY Left    secondary to infection   I & D EXTREMITY Right 09/09/2012   Procedure: Minor IRRIGATION AND DEBRIDEMENT EXTREMITY paronychia of right thumb;  Surgeon: Wynonia Sours, MD;  Location: [facility];  Service: Orthopedics;  Laterality: Right;   I & D EXTREMITY Left 01/26/2014   Procedure: MINOR IINCISION AND DRAINAGE paronychia left index finger;  Surgeon: Daryll Brod, MD;  Location: [facility];  Service: Orthopedics;  Laterality: Left;  left index   OOPHORECTOMY Left 1994   REPAIR KNEE LIGAMENT Left 1989   TONSILLECTOMY  1955   TUBAL LIGATION     VEIN SURGERY      Family History  Problem Relation Age of Onset   Heart disease Mother    Colon polyps Mother    Heart disease Father    Colitis Father    Colon polyps Father    Esophageal cancer Maternal Aunt    Colon cancer Neg Hx     Social History   Socioeconomic History   Marital status: Married    Spouse name: Not on file   Number of children: 2   Years of education: Not on file   Highest education level: Not on file  Occupational  History   Occupation: retired school system  Tobacco Use   Smoking status: Never   Smokeless tobacco: Never  Vaping Use   Vaping Use: Never used  Substance and Sexual Activity   Alcohol use: Yes    Alcohol/week: 1.0 standard drink    Types: 1 Standard drinks or equivalent per week    Comment: social wine   Drug use: No   Sexual activity: Yes  Other Topics Concern   Not on file  Social History Narrative   Not on file   Social Determinants of Health   Financial Resource Strain: Low Risk    Difficulty of Paying Living Expenses: Not hard at all  Food Insecurity: No Food Insecurity   Worried About Charity fundraiser in the Last Year: Never true   [place] in the Last Year: Never true  Transportation Needs: No Transportation Needs   Lack of Transportation (Medical): No   Lack of Transportation (Non-Medical): No  Physical Activity: Sufficiently Active   Days of Exercise per Week: 4 days   Minutes of Exercise per Session: 40 min  Stress: No Stress Concern Present   Feeling of Stress : Only a little  Social Connections: Engineer, building services of Communication with Friends and Family: More than three times a week   Frequency of Social Gatherings with Friends and Family: Once a week   Attends Religious Services: More than 4 times per year   Active Member of [employer] or Organizations: Yes   Attends Music therapist: More than 4 times per year   Marital Status: Married  Human resources officer Violence: Not At Risk   Fear of Current or Ex-Partner: No   Emotionally Abused: No   Physically Abused: No   Sexually Abused: No    Outpatient Medications Prior to Visit  Medication Sig Dispense Refill   atenolol (TENORMIN) 25 MG tablet Take 0.5 tablets (12.5 mg total) by mouth daily as needed (for palpitations). 15 tablet 0   calcium-vitamin D 250-100 MG-UNIT per tablet Take 1 tablet by mouth daily.     cycloSPORINE, PF, (CEQUA) 0.09 % SOLN Cequa 0.09 % eye drops in a  dropperette     docusate sodium (COLACE) 100 MG capsule Take 100 mg by mouth daily. Daily at bedtime      doxycycline (VIBRA-TABS) 100 MG tablet Take 1 tablet (100 mg total) by mouth 2 (two) times daily. (Patient taking differently: Take 100 mg by mouth as needed.) 20 tablet 0   estradiol (ESTRACE) 0.5 MG tablet Take 0.5 mg by mouth daily.     hydrocortisone (ANUSOL-HC) 2.5 % rectal cream Place 1 application rectally 2 (two) times daily. 30 g 0   meclizine (ANTIVERT) 12.5 MG tablet meclizine 12.5 mg tablet  one tablet every 6-12 hours as needed for dizziness     meloxicam (MOBIC) 15 MG tablet Take 1 tablet (15 mg total) by mouth as needed. 30 tablet 1   Multiple Vitamin (MULTIVITAMIN) tablet Take 1 tablet by mouth daily.     NON FORMULARY Hydro eye     omeprazole (PRILOSEC) 20 MG capsule Take 1 capsule (20 mg total) by mouth daily. AS NEEDED (Patient taking differently: Take 20 mg by mouth as needed. AS NEEDED) 100 capsule 4   zolmitriptan (ZOMIG-ZMT) 2.5 MG disintegrating tablet TAKE ONE TABLET BY MOUTH AS NEEDED FOR MIGRAINE 10 tablet 4   No facility-administered medications prior to visit.    Allergies  Allergen Reactions   Codeine    Compazine    Gabapentin    Prednisone     REACTION: makes heart beat hard \\T \ fast   Prochlorperazine Edisylate    Sulfa Antibiotics    Sulfonamide Derivatives     ROS Review of Systems  Constitutional:  Negative for chills, diaphoresis, fatigue, fever and unexpected weight change.  Respiratory: Negative.    Cardiovascular: Negative.   Gastrointestinal:  Negative for abdominal pain, anal bleeding, blood in stool, nausea and vomiting.  Neurological:  Positive for dizziness. Negative for speech difficulty.  Psychiatric/Behavioral: Negative.       Objective:    Physical Exam Vitals and nursing note reviewed.  Constitutional:      General: She is not in acute distress.    Appearance: Normal appearance. She is normal weight. She is not  ill-appearing, toxic-appearing or diaphoretic.  HENT:     Head: Normocephalic and atraumatic.     Mouth/Throat:     Mouth: Mucous membranes are moist.     Pharynx: Oropharynx is clear. No oropharyngeal exudate or posterior oropharyngeal erythema.  Eyes:     General: No scleral  icterus.       Right eye: No discharge.        Left eye: No discharge.     Extraocular Movements: Extraocular movements intact.     Conjunctiva/sclera: Conjunctivae normal.     Pupils: Pupils are equal, round, and reactive to light.  Cardiovascular:     Rate and Rhythm: Normal rate and regular rhythm.  Pulmonary:     Effort: Pulmonary effort is normal.     Breath sounds: Normal breath sounds.  Abdominal:     General: Bowel sounds are normal. There is no distension.     Palpations: There is no mass.     Tenderness: There is no abdominal tenderness. There is no guarding or rebound.     Hernia: No hernia is present.  Skin:    General: Skin is warm and dry.  Neurological:     Mental Status: She is alert and oriented to person, place, and time.  Psychiatric:        Mood and Affect: Mood normal.        Behavior: Behavior normal.    BP 122/64   Pulse 90   Temp 98.1 F (36.7 C)   Ht 5\' 1"  (1.549 m)   Wt 116 lb (52.6 kg)   SpO2 97%   BMI 21.92 kg/m  Wt Readings from Last 3 Encounters:  08/06/20 116 lb (52.6 kg)  07/30/20 114 lb (51.7 kg)  06/07/20 115 lb 6.4 oz (52.3 kg)     Health Maintenance Due  Topic Date Due   Zoster Vaccines- Shingrix (1 of 2) Never done    There are no preventive care reminders to display for this patient.  Lab Results  Component Value Date   TSH 1.14 04/15/2020   Lab Results  Component Value Date   WBC 9.4 04/15/2020   HGB 13.6 04/15/2020   HCT 40.0 04/15/2020   MCV 93.9 04/15/2020   PLT 271.0 04/15/2020   Lab Results  Component Value Date   NA 140 04/15/2020   K 4.5 04/15/2020   CO2 31 04/15/2020   GLUCOSE 105 (H) 04/15/2020   BUN 15 04/15/2020    CREATININE 0.69 04/15/2020   BILITOT 0.5 04/15/2020   ALKPHOS 58 04/15/2020   AST 17 04/15/2020   ALT 13 04/15/2020   PROT 7.1 04/15/2020   ALBUMIN 4.3 04/15/2020   CALCIUM 10.1 04/15/2020   ANIONGAP 8 02/22/2018   GFR 87.22 04/15/2020   Lab Results  Component Value Date   CHOL 183 04/15/2020   Lab Results  Component Value Date   HDL 74.90 04/15/2020   Lab Results  Component Value Date   LDLCALC 80 04/15/2020   Lab Results  Component Value Date   TRIG 139.0 04/15/2020   Lab Results  Component Value Date   CHOLHDL 2 04/15/2020   No results found for: HGBA1C    Assessment & Plan:   Problem List Items Addressed This Visit       Digestive   IRRITABLE BOWEL SYNDROME     Nervous and Auditory   Labyrinthitis - Primary    No orders of the defined types were placed in this encounter.   Follow-up: No follow-ups on file.  Patient will follow back up if there is any development of increasing abdominal pain, pus or blood in her stools.  Labyrinthitis should continue to improve.  She will let me know if it does not.  Libby Maw, MD

## 2020-08-07 ENCOUNTER — Ambulatory Visit: Payer: Medicare PPO | Admitting: Physical Therapy

## 2020-08-07 ENCOUNTER — Encounter: Payer: Self-pay | Admitting: Physical Therapy

## 2020-08-07 ENCOUNTER — Other Ambulatory Visit: Payer: Self-pay

## 2020-08-07 DIAGNOSIS — R262 Difficulty in walking, not elsewhere classified: Secondary | ICD-10-CM | POA: Diagnosis not present

## 2020-08-07 DIAGNOSIS — L821 Other seborrheic keratosis: Secondary | ICD-10-CM | POA: Diagnosis not present

## 2020-08-07 DIAGNOSIS — M79672 Pain in left foot: Secondary | ICD-10-CM

## 2020-08-07 DIAGNOSIS — Z85828 Personal history of other malignant neoplasm of skin: Secondary | ICD-10-CM | POA: Diagnosis not present

## 2020-08-07 DIAGNOSIS — M25675 Stiffness of left foot, not elsewhere classified: Secondary | ICD-10-CM

## 2020-08-07 DIAGNOSIS — L570 Actinic keratosis: Secondary | ICD-10-CM | POA: Diagnosis not present

## 2020-08-07 DIAGNOSIS — L820 Inflamed seborrheic keratosis: Secondary | ICD-10-CM | POA: Diagnosis not present

## 2020-08-07 DIAGNOSIS — D692 Other nonthrombocytopenic purpura: Secondary | ICD-10-CM | POA: Diagnosis not present

## 2020-08-07 DIAGNOSIS — D225 Melanocytic nevi of trunk: Secondary | ICD-10-CM | POA: Diagnosis not present

## 2020-08-07 NOTE — Therapy (Signed)
[facility]. [place], [place], [address] Phone: [phone]   Fax:  [phone]  Physical Therapy Treatment  Patient Details  Name: [name] MRN: [id number] Date of Birth: 19-Oct-1950 Referring Provider (PT): Kremmer   Encounter Date: 08/07/2020   PT End of Session - 08/07/20 8921     Visit Number 6    Number of Visits 12    Authorization Type [employer]    PT Start Time 1350    PT Stop Time 1941    PT Time Calculation (min) 41 min    Activity Tolerance Patient tolerated treatment well    Behavior During Therapy WFL for tasks assessed/performed             Past Medical History:  Diagnosis Date   Anal fissure    Anemia    during pregnancy   Anxiety    Basal cell carcinoma    DDD (degenerative disc disease)    Diverticulosis    Hypokalemia    LBP (low back pain)    Melanoma (HCC)    Migraine    MVP (mitral valve prolapse) 1990   psa   Rosacea    Ulcer    as a child    Past Surgical History:  Procedure Laterality Date   ABDOMINAL HYSTERECTOMY  1985   BREAST BIOPSY Left    secondary to infection   I & D EXTREMITY Right 09/09/2012   Procedure: Minor IRRIGATION AND DEBRIDEMENT EXTREMITY paronychia of right thumb;  Surgeon: Wynonia Sours, MD;  Location: [facility];  Service: Orthopedics;  Laterality: Right;   I & D EXTREMITY Left 01/26/2014   Procedure: MINOR IINCISION AND DRAINAGE paronychia left index finger;  Surgeon: Daryll Brod, MD;  Location: [facility];  Service: Orthopedics;  Laterality: Left;  left index   OOPHORECTOMY Left 1994   REPAIR KNEE LIGAMENT Left 1989   TONSILLECTOMY  1955   TUBAL LIGATION     VEIN SURGERY      There were no vitals filed for this visit.   Subjective Assessment - 08/07/20 1357     Subjective Patient reports about 60% better overall, still hurts in the AM and with walking    Currently in Pain? Yes    Pain Score 2     Pain  Location Foot    Pain Orientation Left                               [facility] Adult PT Treatment/Exercise - 08/07/20 0001       Manual Therapy   Manual Therapy Soft tissue mobilization;Joint mobilization    Manual therapy comments toe stretches into flexion and extension    Joint Mobilization Mid foot joint mobs stiff mid foot    Soft tissue mobilization to the left plantar area      Ankle Exercises: Aerobic   Recumbent Bike level 2 x 6 minutes    Nustep level 5 x 5 minutes      Ankle Exercises: Stretches   Plantar Fascia Stretch 4 reps;20 seconds    Soleus Stretch 3 reps;20 seconds    Gastroc Stretch 4 reps;20 seconds      Ankle Exercises: Seated   [place] Pickup 20x                      PT Short Term Goals - 07/10/20 1524  PT SHORT TERM GOAL #1   Title independent with initial HEP    Status Achieved               PT Long Term Goals - 08/07/20 1437       PT LONG TERM GOAL #1   Title decrease pain 50%    Status Partially Met      PT LONG TERM GOAL #2   Title increase DF to 10 degrees    Status Partially Met      PT LONG TERM GOAL #3   Title independent with RICE    Status Achieved      PT LONG TERM GOAL #4   Title no difficulty walking    Status Partially Met                   Plan - 08/07/20 1432     Clinical Impression Statement We have used 6 visits of 12 asked for.  She reports that she is about 60% better, she has been very active with going on vacation and a wedding, she had one or two small set backs with wearing poor shoes and then falling.  She still has some pain in the AM and is tight, she is going to go to the beach and try to walk some, we went over stretches, ice and how to slowly start increasing walking, assuring good shoes.  I would like to request 6 more visits to assure that she has no issues especially with her going on a two week vacation    PT Frequency 1x / week    PT Duration 8 weeks     PT Treatment/Interventions ADLs/Self Care Home Management;Cryotherapy;Electrical Stimulation;Iontophoresis 58m/ml Dexamethasone;Ultrasound;Gait training;Neuromuscular re-education;Balance training;Therapeutic exercise;Therapeutic activities;Functional mobility training;Stair training;Patient/family education;Manual techniques    PT Next Visit Plan continue to work out the Plantar fascia flexibility and calf flexibility    Consulted and Agree with Plan of Care Patient             Patient will benefit from skilled therapeutic intervention in order to improve the following deficits and impairments:  Abnormal gait, Decreased range of motion, Difficulty walking, Increased fascial restricitons, Pain, Decreased balance, Impaired flexibility, Decreased strength  Visit Diagnosis: Pain in left foot  Stiffness of left foot, not elsewhere classified  Difficulty in walking, not elsewhere classified     Problem List Patient Active Problem List   Diagnosis Date Noted   Labyrinthitis 07/30/2020   Encounter for counseling 07/19/2020   Palpitations 04/15/2020   Plantar fasciitis 04/15/2020   Dysthymia 04/15/2020   Vitamin D deficiency 04/15/2020   Gastroesophageal reflux disease 04/15/2020   Insect bite of left foot 11/24/2019   Pustule 11/24/2019   Chronic pain of right thumb 03/09/2019   External hemorrhoids 03/03/2018   Other chest pain 02/23/2018   Insomnia 02/24/2017   Healthcare maintenance 02/24/2017   Conjunctivitis 03/01/2014   Seborrheic keratosis, inflamed 01/05/2012   Postmenopausal disorder 12/22/2011   Skin cancer 07/14/2011   Migraine 05/14/2007   IRRITABLE BOWEL SYNDROME 05/14/2007   History of mitral valve prolapse 05/14/2007   Allergic rhinitis 04/07/2007   DERMATITIS DUE TO SOLAR RADIATION 04/07/2007   Premature beat 09/20/2006   DIVERTICULOSIS, COLON 04/13/2002    ASumner Boast, PT 08/07/2020, 2:40 PM  [place] [address]Phone: [phone]  Fax:  [phone] Name: Tracey HAUGHEYMRN: [id number] of Birth: 808-16-1952

## 2020-09-04 ENCOUNTER — Other Ambulatory Visit: Payer: Self-pay

## 2020-09-04 ENCOUNTER — Encounter: Payer: Self-pay | Admitting: Physical Therapy

## 2020-09-04 ENCOUNTER — Ambulatory Visit: Payer: Medicare PPO | Attending: Family Medicine | Admitting: Physical Therapy

## 2020-09-04 DIAGNOSIS — M79672 Pain in left foot: Secondary | ICD-10-CM | POA: Diagnosis not present

## 2020-09-04 DIAGNOSIS — M25675 Stiffness of left foot, not elsewhere classified: Secondary | ICD-10-CM | POA: Insufficient documentation

## 2020-09-04 DIAGNOSIS — R262 Difficulty in walking, not elsewhere classified: Secondary | ICD-10-CM | POA: Diagnosis not present

## 2020-09-04 DIAGNOSIS — F331 Major depressive disorder, recurrent, moderate: Secondary | ICD-10-CM | POA: Diagnosis not present

## 2020-09-04 NOTE — Therapy (Signed)
[facility]. [place], [place], [address] Phone: [phone]   Fax:  [phone]  Physical Therapy Treatment  Patient Details  Name: [name] MRN: [id number] Date of Birth: 08/27/1950 Referring Provider (PT): Kremmer   Encounter Date: 09/04/2020   PT End of Session - 09/04/20 1134     Visit Number 7    Number of Visits 16    Date for PT Re-Evaluation 11/06/20    Authorization Type [employer]    PT Start Time 1010    PT Stop Time 1050    PT Time Calculation (min) 40 min    Activity Tolerance Patient tolerated treatment well    Behavior During Therapy WFL for tasks assessed/performed             Past Medical History:  Diagnosis Date   Anal fissure    Anemia    during pregnancy   Anxiety    Basal cell carcinoma    DDD (degenerative disc disease)    Diverticulosis    Hypokalemia    LBP (low back pain)    Melanoma (HCC)    Migraine    MVP (mitral valve prolapse) 1990   psa   Rosacea    Ulcer    as a child    Past Surgical History:  Procedure Laterality Date   ABDOMINAL HYSTERECTOMY  1985   BREAST BIOPSY Left    secondary to infection   I & D EXTREMITY Right 09/09/2012   Procedure: Minor IRRIGATION AND DEBRIDEMENT EXTREMITY paronychia of right thumb;  Surgeon: Wynonia Sours, MD;  Location: [facility];  Service: Orthopedics;  Laterality: Right;   I & D EXTREMITY Left 01/26/2014   Procedure: MINOR IINCISION AND DRAINAGE paronychia left index finger;  Surgeon: Daryll Brod, MD;  Location: [facility];  Service: Orthopedics;  Laterality: Left;  left index   OOPHORECTOMY Left 1994   REPAIR KNEE LIGAMENT Left 1989   TONSILLECTOMY  1955   TUBAL LIGATION     VEIN SURGERY      There were no vitals filed for this visit.   Subjective Assessment - 09/04/20 1018     Subjective Patient reports that she has been at the beach has not done the stretcing or exercises, repots feet  are worse    Currently in Pain? Yes    Pain Score 4     Pain Location Foot    Pain Orientation Left    Pain Descriptors / Indicators Sharp    Aggravating Factors  standing and walking                               [facility] Adult PT Treatment/Exercise - 09/04/20 0001       Iontophoresis   Type of Iontophoresis Dexamethasone    Location left PF origin    Dose 20m    Time 4hour patch      Manual Therapy   Manual Therapy Soft tissue mobilization;Joint mobilization    Manual therapy comments toe stretches into flexion and extension    Joint Mobilization Mid foot joint mobs stiff mid foot    Soft tissue mobilization to the left plantar area      Ankle Exercises: Stretches   Plantar Fascia Stretch 4 reps;20 seconds    Gastroc Stretch 4 reps;20 seconds  PT Education - 09/04/20 1131     Education Details talked with her at length about the need to do the streches as she has stopped, she seems to be wearing better shoes now.  She has jsut stopped doing the stretches, I educated her on the difficulty of PF and that she places a lot of stress on her feet daily    Person(s) Educated Patient    Methods Explanation;Demonstration;Verbal cues    Comprehension Verbalized understanding              PT Short Term Goals - 07/10/20 1524       PT SHORT TERM GOAL #1   Title independent with initial HEP    Status Achieved               PT Long Term Goals - 09/04/20 1137       PT LONG TERM GOAL #1   Title decrease pain 50%    Status Partially Met                   Plan - 09/04/20 1135     Clinical Impression Statement Patient reports that she has been at the beach over the past month, she reports frustration with the continued pain, she does admit to not doing the stretches and exercises or the icing.  I reiterated the importance of this and how much stress her feet go through daily.  She is still very tender and tight  in the PF area, has a slight limp today    PT Next Visit Plan continue to work out the Plantar fascia flexibility and calf flexibility    Consulted and Agree with Plan of Care Patient             Patient will benefit from skilled therapeutic intervention in order to improve the following deficits and impairments:  Abnormal gait, Decreased range of motion, Difficulty walking, Increased fascial restricitons, Pain, Decreased balance, Impaired flexibility, Decreased strength  Visit Diagnosis: Pain in left foot  Stiffness of left foot, not elsewhere classified  Difficulty in walking, not elsewhere classified     Problem List Patient Active Problem List   Diagnosis Date Noted   Labyrinthitis 07/30/2020   Encounter for counseling 07/19/2020   Palpitations 04/15/2020   Plantar fasciitis 04/15/2020   Dysthymia 04/15/2020   Vitamin D deficiency 04/15/2020   Gastroesophageal reflux disease 04/15/2020   Insect bite of left foot 11/24/2019   Pustule 11/24/2019   Chronic pain of right thumb 03/09/2019   External hemorrhoids 03/03/2018   Other chest pain 02/23/2018   Insomnia 02/24/2017   Healthcare maintenance 02/24/2017   Conjunctivitis 03/01/2014   Seborrheic keratosis, inflamed 01/05/2012   Postmenopausal disorder 12/22/2011   Skin cancer 07/14/2011   Migraine 05/14/2007   IRRITABLE BOWEL SYNDROME 05/14/2007   History of mitral valve prolapse 05/14/2007   Allergic rhinitis 04/07/2007   DERMATITIS DUE TO SOLAR RADIATION 04/07/2007   Premature beat 09/20/2006   DIVERTICULOSIS, COLON 04/13/2002    Sumner Boast., PT 09/04/2020, 11:39 AM  [facility]. [place], [place], [address] Phone: [phone]   Fax:  [phone]  Name: [name] MRN: [id number] Date of Birth: 11-19-1950

## 2020-09-06 ENCOUNTER — Telehealth: Payer: Medicare PPO

## 2020-09-07 ENCOUNTER — Encounter: Payer: Self-pay | Admitting: Family Medicine

## 2020-09-07 DIAGNOSIS — R509 Fever, unspecified: Secondary | ICD-10-CM | POA: Diagnosis not present

## 2020-09-07 DIAGNOSIS — Z20822 Contact with and (suspected) exposure to covid-19: Secondary | ICD-10-CM | POA: Diagnosis not present

## 2020-09-09 ENCOUNTER — Telehealth: Payer: Medicare PPO | Admitting: Family Medicine

## 2020-09-20 ENCOUNTER — Ambulatory Visit: Payer: Medicare PPO | Attending: Family Medicine | Admitting: Physical Therapy

## 2020-09-20 ENCOUNTER — Encounter: Payer: Self-pay | Admitting: Physical Therapy

## 2020-09-20 ENCOUNTER — Other Ambulatory Visit: Payer: Self-pay

## 2020-09-20 DIAGNOSIS — M79672 Pain in left foot: Secondary | ICD-10-CM | POA: Diagnosis not present

## 2020-09-20 DIAGNOSIS — M542 Cervicalgia: Secondary | ICD-10-CM

## 2020-09-20 DIAGNOSIS — R252 Cramp and spasm: Secondary | ICD-10-CM

## 2020-09-20 DIAGNOSIS — M25675 Stiffness of left foot, not elsewhere classified: Secondary | ICD-10-CM | POA: Diagnosis not present

## 2020-09-20 DIAGNOSIS — R262 Difficulty in walking, not elsewhere classified: Secondary | ICD-10-CM | POA: Diagnosis not present

## 2020-09-20 NOTE — Therapy (Signed)
[facility]. [place], [place], [address] Phone: [phone]   Fax:  [phone]  Physical Therapy Treatment  Patient Details  Name: [name] MRN: [id number] Date of Birth: [birth year] Referring Provider (PT): Kremmer   Encounter Date: 09/20/2020   PT End of Session - 09/20/20 1100     Visit Number 8    Number of Visits 16    Date for PT Re-Evaluation 11/06/20    Authorization Type [employer]    PT Start Time 1010    PT Stop Time 1050    PT Time Calculation (min) 40 min    Activity Tolerance Patient tolerated treatment well    Behavior During Therapy WFL for tasks assessed/performed             Past Medical History:  Diagnosis Date   Anal fissure    Anemia    during pregnancy   Anxiety    Basal cell carcinoma    DDD (degenerative disc disease)    Diverticulosis    Hypokalemia    LBP (low back pain)    Melanoma (HCC)    Migraine    MVP (mitral valve prolapse) 1990   psa   Rosacea    Ulcer    as a child    Past Surgical History:  Procedure Laterality Date   ABDOMINAL HYSTERECTOMY  1985   BREAST BIOPSY Left    secondary to infection   I & D EXTREMITY Right 09/09/2012   Procedure: Minor IRRIGATION AND DEBRIDEMENT EXTREMITY paronychia of right thumb;  Surgeon: Wynonia Sours, MD;  Location: [facility];  Service: Orthopedics;  Laterality: Right;   I & D EXTREMITY Left 01/26/2014   Procedure: MINOR IINCISION AND DRAINAGE paronychia left index finger;  Surgeon: Daryll Brod, MD;  Location: [facility];  Service: Orthopedics;  Laterality: Left;  left index   OOPHORECTOMY Left 1994   REPAIR KNEE LIGAMENT Left 1989   TONSILLECTOMY  1955   TUBAL LIGATION     VEIN SURGERY      There were no vitals filed for this visit.   Subjective Assessment - 09/20/20 1018     Subjective Patient reports a lot of stress wih her dad having a heart attack and husband having a biopsy     Currently in Pain? Yes    Pain Score 2     Pain Location Foot    Pain Orientation Left    Pain Descriptors / Indicators Sharp;Sore    Aggravating Factors  after sitting pain up to 6/10, she is walking up 6 flights of stairs more than once daily to see her dad, she does "not do elevators"                               [facility] Adult PT Treatment/Exercise - 09/20/20 0001       Ambulation/Gait   Gait Comments gait around the back building up and down slopes, pain lateral heel with down slope with the heel strike      Manual Therapy   Manual Therapy Soft tissue mobilization;Joint mobilization    Manual therapy comments toe stretches into flexion and extension    Joint Mobilization Mid foot joint mobs stiff mid foot    Soft tissue mobilization to the left plantar area and into the calf      Ankle Exercises: Stretches   Plantar Fascia Stretch 4  reps;20 seconds    Soleus Stretch 3 reps;20 seconds    Gastroc Stretch 4 reps;20 seconds      Ankle Exercises: Supine   T-Band red all ankle motions                      PT Short Term Goals - 07/10/20 1524       PT SHORT TERM GOAL #1   Title independent with initial HEP    Status Achieved               PT Long Term Goals - 09/20/20 1103       PT LONG TERM GOAL #1   Title decrease pain 50%    Status Partially Met      PT LONG TERM GOAL #2   Title increase DF to 10 degrees    Status Partially Met      PT LONG TERM GOAL #3   Title independent with RICE    Status Achieved      PT LONG TERM GOAL #4   Title no difficulty walking    Status Partially Met                   Plan - 09/20/20 1100     Clinical Impression Statement Overall patient is doing better, reports pain in the heel and foot mostly after sitting, she reports a lot of life events that she has not been able to do the stretches, she does report wearing better shoes most of the time now,. she had pain with heel strike  going down slopes.  She would like to resume walking so we started the education of that.    PT Next Visit Plan advised return to walking slowly but documenting how long or far she goes so she does not over do it    Consulted and Agree with Plan of Care Patient             Patient will benefit from skilled therapeutic intervention in order to improve the following deficits and impairments:  Abnormal gait, Decreased range of motion, Difficulty walking, Increased fascial restricitons, Pain, Decreased balance, Impaired flexibility, Decreased strength  Visit Diagnosis: Pain in left foot  Stiffness of left foot, not elsewhere classified  Difficulty in walking, not elsewhere classified  Cervicalgia  Cramp and spasm     Problem List Patient Active Problem List   Diagnosis Date Noted   Labyrinthitis 07/30/2020   Encounter for counseling 07/19/2020   Palpitations 04/15/2020   Plantar fasciitis 04/15/2020   Dysthymia 04/15/2020   Vitamin D deficiency 04/15/2020   Gastroesophageal reflux disease 04/15/2020   Insect bite of left foot 11/24/2019   Pustule 11/24/2019   Chronic pain of right thumb 03/09/2019   External hemorrhoids 03/03/2018   Other chest pain 02/23/2018   Insomnia 02/24/2017   Healthcare maintenance 02/24/2017   Conjunctivitis 03/01/2014   Seborrheic keratosis, inflamed 01/05/2012   Postmenopausal disorder 12/22/2011   Skin cancer 07/14/2011   Migraine 05/14/2007   IRRITABLE BOWEL SYNDROME 05/14/2007   History of mitral valve prolapse 05/14/2007   Allergic rhinitis 04/07/2007   DERMATITIS DUE TO SOLAR RADIATION 04/07/2007   Premature beat 09/20/2006   DIVERTICULOSIS, COLON 04/13/2002    Sumner Boast., PT 09/20/2020, 12:07 PM  [facility] [address]. [place], [place], [address] Phone: [phone]   Fax:  [phone]  Name: [name] MRN: [id number] Date of Birth: 08/26/1950

## 2020-09-25 ENCOUNTER — Ambulatory Visit: Payer: Medicare PPO | Admitting: Physical Therapy

## 2020-09-26 ENCOUNTER — Ambulatory Visit: Payer: Medicare PPO | Admitting: Physical Therapy

## 2020-10-02 ENCOUNTER — Other Ambulatory Visit: Payer: Self-pay

## 2020-10-02 ENCOUNTER — Encounter: Payer: Self-pay | Admitting: Physical Therapy

## 2020-10-02 ENCOUNTER — Ambulatory Visit: Payer: Medicare PPO | Admitting: Physical Therapy

## 2020-10-02 DIAGNOSIS — M79672 Pain in left foot: Secondary | ICD-10-CM

## 2020-10-02 DIAGNOSIS — M25675 Stiffness of left foot, not elsewhere classified: Secondary | ICD-10-CM | POA: Diagnosis not present

## 2020-10-02 DIAGNOSIS — R252 Cramp and spasm: Secondary | ICD-10-CM | POA: Diagnosis not present

## 2020-10-02 DIAGNOSIS — M542 Cervicalgia: Secondary | ICD-10-CM

## 2020-10-02 DIAGNOSIS — F331 Major depressive disorder, recurrent, moderate: Secondary | ICD-10-CM | POA: Diagnosis not present

## 2020-10-02 DIAGNOSIS — R262 Difficulty in walking, not elsewhere classified: Secondary | ICD-10-CM | POA: Diagnosis not present

## 2020-10-02 NOTE — Therapy (Signed)
[facility]. [place], [place], [address] Phone: [phone]   Fax:  [phone]  Physical Therapy Treatment  Patient Details  Name: [name] MRN: [id number] Date of Birth: 12-31-1950 Referring Provider (PT): Kremmer   Encounter Date: 10/02/2020   PT End of Session - 10/02/20 1111     Visit Number 9    Number of Visits 16    Date for PT Re-Evaluation 11/06/20    Authorization Type [employer]    PT Start Time 1012    PT Stop Time 1100    PT Time Calculation (min) 48 min    Activity Tolerance Patient tolerated treatment well    Behavior During Therapy WFL for tasks assessed/performed             Past Medical History:  Diagnosis Date   Anal fissure    Anemia    during pregnancy   Anxiety    Basal cell carcinoma    DDD (degenerative disc disease)    Diverticulosis    Hypokalemia    LBP (low back pain)    Melanoma (HCC)    Migraine    MVP (mitral valve prolapse) 1990   psa   Rosacea    Ulcer    as a child    Past Surgical History:  Procedure Laterality Date   ABDOMINAL HYSTERECTOMY  1985   BREAST BIOPSY Left    secondary to infection   I & D EXTREMITY Right 09/09/2012   Procedure: Minor IRRIGATION AND DEBRIDEMENT EXTREMITY paronychia of right thumb;  Surgeon: Wynonia Sours, MD;  Location: [facility];  Service: Orthopedics;  Laterality: Right;   I & D EXTREMITY Left 01/26/2014   Procedure: MINOR IINCISION AND DRAINAGE paronychia left index finger;  Surgeon: Daryll Brod, MD;  Location: [facility];  Service: Orthopedics;  Laterality: Left;  left index   OOPHORECTOMY Left 1994   REPAIR KNEE LIGAMENT Left 1989   TONSILLECTOMY  1955   TUBAL LIGATION     VEIN SURGERY      There were no vitals filed for this visit.   Subjective Assessment - 10/02/20 1022     Subjective Doing better overall, less pain, pain is really only after sitting and in the morning when getting up     Currently in Pain? Yes    Pain Score 1     Pain Location Foot    Pain Orientation Left                               [facility] Adult PT Treatment/Exercise - 10/02/20 0001       Ambulation/Gait   Gait Comments gait around the back building up and down slopes, pain lateral heel with down slope with the heel strike      Manual Therapy   Manual Therapy Soft tissue mobilization;Joint mobilization    Manual therapy comments toe stretches into flexion and extension    Joint Mobilization Mid foot joint mobs stiff mid foot    Soft tissue mobilization to the left plantar area and into the calf      Ankle Exercises: Aerobic   Recumbent Bike level 2 x 6 minutes      Ankle Exercises: Stretches   Plantar Fascia Stretch 4 reps;20 seconds    Soleus Stretch 3 reps;20 seconds    Gastroc Stretch 4 reps;20 seconds      Ankle  Exercises: Supine   T-Band red all ankle motions      Ankle Exercises: Seated   [place] Pickup 20x                      PT Short Term Goals - 07/10/20 1524       PT SHORT TERM GOAL #1   Title independent with initial HEP    Status Achieved               PT Long Term Goals - 10/02/20 1113       PT LONG TERM GOAL #1   Title decrease pain 50%    Status Achieved      PT LONG TERM GOAL #2   Title increase DF to 10 degrees    Status Achieved      PT LONG TERM GOAL #3   Title independent with RICE    Status Achieved      PT LONG TERM GOAL #4   Title no difficulty walking    Status Achieved                   Plan - 10/02/20 1111     Clinical Impression Statement Patient has had a lot of stress with her dad having a heart attack and being in the hospital and her husband being dx with prostate CA.  She reports that the foot is feeling better , still painful when she gets up in the morning or after sitting for any length of time.  She is still tender in the PF area on the left but not as much as prior, her ROM is  improved and her gait is normal.    PT Next Visit Plan will hold as goals are mostly met, she reports that she may return in about two weeks to see how she is doing    Consulted and Agree with Plan of Care Patient             Patient will benefit from skilled therapeutic intervention in order to improve the following deficits and impairments:  Abnormal gait, Decreased range of motion, Difficulty walking, Increased fascial restricitons, Pain, Decreased balance, Impaired flexibility, Decreased strength  Visit Diagnosis: Pain in left foot  Stiffness of left foot, not elsewhere classified  Difficulty in walking, not elsewhere classified  Cervicalgia  Cramp and spasm     Problem List Patient Active Problem List   Diagnosis Date Noted   Labyrinthitis 07/30/2020   Encounter for counseling 07/19/2020   Palpitations 04/15/2020   Plantar fasciitis 04/15/2020   Dysthymia 04/15/2020   Vitamin D deficiency 04/15/2020   Gastroesophageal reflux disease 04/15/2020   Insect bite of left foot 11/24/2019   Pustule 11/24/2019   Chronic pain of right thumb 03/09/2019   External hemorrhoids 03/03/2018   Other chest pain 02/23/2018   Insomnia 02/24/2017   Healthcare maintenance 02/24/2017   Conjunctivitis 03/01/2014   Seborrheic keratosis, inflamed 01/05/2012   Postmenopausal disorder 12/22/2011   Skin cancer 07/14/2011   Migraine 05/14/2007   IRRITABLE BOWEL SYNDROME 05/14/2007   History of mitral valve prolapse 05/14/2007   Allergic rhinitis 04/07/2007   DERMATITIS DUE TO SOLAR RADIATION 04/07/2007   Premature beat 09/20/2006   DIVERTICULOSIS, COLON 04/13/2002    Sumner Boast., PT 10/02/2020, 11:14 AM  [facility]. [place], [place], [address] Phone: [phone]   Fax:  [phone]  Name: AMARIYA LISKEY MRN: [id number] Date of  Birth: 1950-06-28

## 2020-10-08 ENCOUNTER — Ambulatory Visit (INDEPENDENT_AMBULATORY_CARE_PROVIDER_SITE_OTHER): Payer: Medicare PPO | Admitting: *Deleted

## 2020-10-08 DIAGNOSIS — Z0000 Encounter for general adult medical examination without abnormal findings: Secondary | ICD-10-CM | POA: Diagnosis not present

## 2020-10-08 NOTE — Patient Instructions (Addendum)
[name] ,   Preventive Care 20 Years and Older, Female Preventive care refers to lifestyle choices and visits with your health care provider that can promote health and wellness. What does preventive care include? A yearly physical exam. This is also called an annual well check. Dental exams once or twice a year. Routine eye exams. Ask your health care provider how often you should have your eyes checked. Personal lifestyle choices, including: Daily care of your teeth and gums. Regular physical activity. Eating a healthy diet. Avoiding tobacco and drug use. Limiting alcohol use. Practicing safe sex. Taking low-dose aspirin every day. Taking vitamin and mineral supplements as recommended by your health care provider. What happens during an annual well check? The services and screenings done by your health care provider during your annual well check will depend on your age, overall health, lifestyle risk factors, and family history of disease. Counseling  Your health care provider may ask you questions about your: Alcohol use. Tobacco use. Drug use. Emotional well-being. Home and relationship well-being. Sexual activity. Eating habits. History of falls. Memory and ability to understand (cognition). Work and work Statistician. Reproductive health. Screening  You may have the following tests or measurements: Height, weight, and BMI. Blood pressure. Lipid and cholesterol levels. These may be checked every 5 years, or more frequently if you are over 30 years old. Skin check. Lung cancer screening. You may have this screening every year starting at age 33 if you have a 30-pack-year history of smoking and currently smoke or have quit within the past 15 years. Fecal occult blood test (FOBT) of the stool. You may have this test every year starting at age 77. Flexible sigmoidoscopy or colonoscopy. You may have a sigmoidoscopy every 5 years or a colonoscopy every 10 years starting at  age 38. Hepatitis C blood test. Hepatitis B blood test. Sexually transmitted disease (STD) testing. Diabetes screening. This is done by checking your blood sugar (glucose) after you have not eaten for a while (fasting). You may have this done every 1-3 years. Bone density scan. This is done to screen for osteoporosis. You may have this done starting at age 3. Mammogram. This may be done every 1-2 years. Talk to your health care provider about how often you should have regular mammograms. Talk with your health care provider about your test results, treatment options, and if necessary, the need for more tests. Vaccines  Your health care provider may recommend certain vaccines, such as: Influenza vaccine. This is recommended every year. Tetanus, diphtheria, and acellular pertussis (Tdap, Td) vaccine. You may need a Td booster every 10 years. Zoster vaccine. You may need this after age 34. Pneumococcal 13-valent conjugate (PCV13) vaccine. One dose is recommended after age 8. Pneumococcal polysaccharide (PPSV23) vaccine. One dose is recommended after age 20. Talk to your health care provider about which screenings and vaccines you need and how often you need them. This information is not intended to replace advice given to you by your health care provider. Make sure you discuss any questions you have with your health care provider. Document Released: 02/22/2015 Document Revised: 10/16/2015 Document Reviewed: 11/27/2014 Elsevier Interactive Patient Education  2017 [place] Prevention in the Home Falls can cause injuries. They can happen to people of all ages. There are many things you can do to make your home safe and to help prevent falls. What can I do on the outside of my home? Regularly fix the edges of walkways and driveways and  fix any cracks. Remove anything that might make you trip as you walk through a door, such as a raised step or threshold. Trim any bushes or trees on the  path to your home. Use bright outdoor lighting. Clear any walking paths of anything that might make someone trip, such as rocks or tools. Regularly check to see if handrails are loose or broken. Make sure that both sides of any steps have handrails. Any raised decks and porches should have guardrails on the edges. Have any leaves, snow, or ice cleared regularly. Use sand or salt on walking paths during winter. Clean up any spills in your garage right away. This includes oil or grease spills. What can I do in the bathroom? Use night lights. Install grab bars by the toilet and in the tub and shower. Do not use towel bars as grab bars. Use non-skid mats or decals in the tub or shower. If you need to sit down in the shower, use a plastic, non-slip stool. Keep the floor dry. Clean up any water that spills on the floor as soon as it happens. Remove soap buildup in the tub or shower regularly. Attach bath mats securely with double-sided non-slip rug tape. Do not have throw rugs and other things on the floor that can make you trip. What can I do in the bedroom? Use night lights. Make sure that you have a light by your bed that is easy to reach. Do not use any sheets or blankets that are too big for your bed. They should not hang down onto the floor. Have a firm chair that has side arms. You can use this for support while you get dressed. Do not have throw rugs and other things on the floor that can make you trip. What can I do in the kitchen? Clean up any spills right away. Avoid walking on wet floors. Keep items that you use a lot in easy-to-reach places. If you need to reach something above you, use a strong step stool that has a grab bar. Keep electrical cords out of the way. Do not use floor polish or wax that makes floors slippery. If you must use wax, use non-skid floor wax. Do not have throw rugs and other things on the floor that can make you trip. What can I do with my stairs? Do not  leave any items on the stairs. Make sure that there are handrails on both sides of the stairs and use them. Fix handrails that are broken or loose. Make sure that handrails are as long as the stairways. Check any carpeting to make sure that it is firmly attached to the stairs. Fix any carpet that is loose or worn. Avoid having throw rugs at the top or bottom of the stairs. If you do have throw rugs, attach them to the floor with carpet tape. Make sure that you have a light switch at the top of the stairs and the bottom of the stairs. If you do not have them, ask someone to add them for you. What else can I do to help prevent falls? Wear shoes that: Do not have high heels. Have rubber bottoms. Are comfortable and fit you well. Are closed at the toe. Do not wear sandals. If you use a stepladder: Make sure that it is fully opened. Do not climb a closed stepladder. Make sure that both sides of the stepladder are locked into place. Ask someone to hold it for you, if possible. Clearly mark and  make sure that you can see: Any grab bars or handrails. First and last steps. Where the edge of each step is. Use tools that help you move around (mobility aids) if they are needed. These include: Canes. Walkers. Scooters. Crutches. Turn on the lights when you go into a dark area. Replace any light bulbs as soon as they burn out. Set up your furniture so you have a clear path. Avoid moving your furniture around. If any of your floors are uneven, fix them. If there are any pets around you, be aware of where they are. Review your medicines with your doctor. Some medicines can make you feel dizzy. This can increase your chance of falling. Ask your doctor what other things that you can do to help prevent falls. This information is not intended to replace advice given to you by your health care provider. Make sure you discuss any questions you have with your health care provider. Document Released:  11/22/2008 Document Revised: 07/04/2015 Document Reviewed: 03/02/2014 Elsevier Interactive Patient Education  2017 [employer].

## 2020-10-08 NOTE — Progress Notes (Signed)
Subjective:   [name] is a 70 y.o. female who presents for Medicare Annual (Subsequent) preventive examination.  I connected with  [name] on 10/08/20 by a telephone enabled telemedicine application and verified that I am speaking with the correct person using two identifiers.   I discussed the limitations of evaluation and management by telemedicine. The patient expressed understanding and agreed to proceed.    Review of Systems    NA  Cardiac Risk Factors include: advanced age (>44mn, >>20women);hypertension     Objective:    Today's Vitals   There is no height or weight on file to calculate BMI.  Advanced Directives 10/08/2020 11/14/2019 10/05/2019 02/22/2018 11/18/2015 05/31/2014  Does Patient Have a Medical Advance Directive? Yes No Yes No No No  Type of [place] - - -  Copy of [place] Chart? No - copy requested - No - copy requested - - -  Would patient like information on creating a medical advance directive? - No - Patient declined - - - No - patient declined information    Current Medications (verified) Outpatient Encounter Medications as of 10/08/2020  Medication Sig   atenolol (TENORMIN) 25 MG tablet Take 0.5 tablets (12.5 mg total) by mouth daily as needed (for palpitations).   doxycycline (VIBRA-TABS) 100 MG tablet Take 1 tablet (100 mg total) by mouth 2 (two) times daily.   estradiol (ESTRACE) 0.5 MG tablet Take 0.5 mg by mouth daily.   hydrocortisone (ANUSOL-HC) 2.5 % rectal cream Place 1 application rectally 2 (two) times daily.   meclizine (ANTIVERT) 12.5 MG tablet meclizine 12.5 mg tablet  one tablet every 6-12 hours as needed for dizziness   meloxicam (MOBIC) 15 MG tablet Take 1 tablet (15 mg total) by mouth as needed.   Multiple Vitamin (MULTIVITAMIN) tablet Take 1 tablet by mouth daily.   NON FORMULARY Hydro eye   omeprazole (PRILOSEC) 20  MG capsule Take 1 capsule (20 mg total) by mouth daily. AS NEEDED (Patient taking differently: Take 20 mg by mouth as needed. AS NEEDED)   zolmitriptan (ZOMIG-ZMT) 2.5 MG disintegrating tablet TAKE ONE TABLET BY MOUTH AS NEEDED FOR MIGRAINE   calcium-vitamin D 250-100 MG-UNIT per tablet Take 1 tablet by mouth daily. (Patient not taking: Reported on 10/08/2020)   cycloSPORINE, PF, (CEQUA) 0.09 % SOLN Cequa 0.09 % eye drops in a dropperette   docusate sodium (COLACE) 100 MG capsule Take 100 mg by mouth daily. Daily at bedtime  (Patient not taking: Reported on 10/08/2020)   No facility-administered encounter medications on file as of 10/08/2020.    Allergies (verified) Codeine, Compazine, Gabapentin, Prednisone, Prochlorperazine edisylate, Sulfa antibiotics, and Sulfonamide derivatives   History: Past Medical History:  Diagnosis Date   Anal fissure    Anemia    during pregnancy   Anxiety    Basal cell carcinoma    DDD (degenerative disc disease)    Diverticulosis    Hypokalemia    LBP (low back pain)    Melanoma (HCC)    Migraine    MVP (mitral valve prolapse) 1990   psa   Rosacea    Ulcer    as a child   Past Surgical History:  Procedure Laterality Date   ABDOMINAL HYSTERECTOMY  1985   BREAST BIOPSY Left    secondary to infection   I & D EXTREMITY Right 09/09/2012   Procedure: Minor IRRIGATION AND DEBRIDEMENT EXTREMITY  paronychia of right thumb;  Surgeon: Wynonia Sours, MD;  Location: [place];  Service: Orthopedics;  Laterality: Right;   I & D EXTREMITY Left 01/26/2014   Procedure: MINOR IINCISION AND DRAINAGE paronychia left index finger;  Surgeon: Daryll Brod, MD;  Location: [place];  Service: Orthopedics;  Laterality: Left;  left index   OOPHORECTOMY Left 1994   REPAIR KNEE LIGAMENT Left 1989   TONSILLECTOMY  1955   TUBAL LIGATION     VEIN SURGERY     Family History  Problem Relation Age of Onset   Heart disease Mother    Colon polyps  Mother    Heart disease Father    Colitis Father    Colon polyps Father    Esophageal cancer Maternal Aunt    Colon cancer Neg Hx    Social History   Socioeconomic History   Marital status: Married    Spouse name: Not on file   Number of children: 2   Years of education: Not on file   Highest education level: Not on file  Occupational History   Occupation: retired school system  Tobacco Use   Smoking status: Never   Smokeless tobacco: Never  Vaping Use   Vaping Use: Never used  Substance and Sexual Activity   Alcohol use: Yes    Alcohol/week: 1.0 standard drink    Types: 1 Standard drinks or equivalent per week    Comment: social wine   Drug use: No   Sexual activity: Yes  Other Topics Concern   Not on file  Social History Narrative   Not on file   Social Determinants of Health   Financial Resource Strain: Low Risk    Difficulty of Paying Living Expenses: Not hard at all  Food Insecurity: No Food Insecurity   Worried About Charity fundraiser in the Last Year: Never true   [place] in the Last Year: Never true  Transportation Needs: No Transportation Needs   Lack of Transportation (Medical): No   Lack of Transportation (Non-Medical): No  Physical Activity: Insufficiently Active   Days of Exercise per Week: 3 days   Minutes of Exercise per Session: 30 min  Stress: No Stress Concern Present   Feeling of Stress : Not at all  Social Connections: Socially Integrated   Frequency of Communication with Friends and Family: More than three times a week   Frequency of Social Gatherings with Friends and Family: More than three times a week   Attends Religious Services: More than 4 times per year   Active Member of [employer] or Organizations: Yes   Attends Archivist Meetings: 1 to 4 times per year   Marital Status: Married    Tobacco Counseling Counseling given: Not Answered   Clinical Intake:  Pre-visit preparation completed: Yes  Pain : No/denies  pain     Nutritional Risks: None Diabetes: No  How often do you need to have someone help you when you read instructions, pamphlets, or other written materials from your doctor or pharmacy?: 1 - Never  Diabetic?no  Interpreter Needed?: No  Information entered by :: Leroy Kennedy LPN   Activities of Daily Living In your present state of health, do you have any difficulty performing the following activities: 10/08/2020 10/08/2020  Hearing? N N  Vision? N N  Difficulty concentrating or making decisions? N N  Walking or climbing stairs? N N  Dressing or bathing? N N  Doing errands, shopping?  N N  Preparing Food and eating ? N N  Using the Toilet? N N  In the past six months, have you accidently leaked urine? N N  Do you have problems with loss of bowel control? N N  Managing your Medications? N N  Managing your Finances? N N  Housekeeping or managing your Housekeeping? N N  Some recent data might be hidden    Patient Care Team: Libby Maw, MD as PCP - General (Family Medicine) Croitoru, Dani Gobble, MD as PCP - Cardiology (Cardiology)  Indicate any recent Medical Services you may have received from other than [facility] providers in the past year (date may be approximate).     Assessment:   This is a routine wellness examination for Kyndra.  Hearing/Vision screen Hearing Screening - Comments:: No trouble hearing Vision Screening - Comments:: Dr. Bing Plume Up to date  Dietary issues and exercise activities discussed: Current Exercise Habits: Home exercise routine, Type of exercise: walking, Time (Minutes): 30, Frequency (Times/Week): 4, Weekly Exercise (Minutes/Week): 120, Intensity: Mild   Goals Addressed             This Visit's Progress    Patient Stated   On track    Drink more water     Patient Stated       Continue current lifestyle       Depression Screen PHQ 2/9 Scores 10/08/2020 07/30/2020 06/07/2020 04/15/2020 04/15/2020 10/05/2019 03/09/2019  PHQ - 2 Score 0  0 0 3 0 1 0  PHQ- 9 Score - - - 9 - - 6    Fall Risk Fall Risk  10/08/2020 08/06/2020 07/30/2020 06/07/2020 04/15/2020  Falls in the past year? 1 0 1 0 0  Number falls in past yr: 0 0 1 - -  Injury with Fall? 1 0 0 - -  Follow up Falls evaluation completed;Falls prevention discussed - - - -    FALL RISK PREVENTION PERTAINING TO THE HOME:  Any stairs in or around the home? No  If so, are there any without handrails? No  Home free of loose throw rugs in walkways, pet beds, electrical cords, etc? Yes  Adequate lighting in your home to reduce risk of falls? Yes   ASSISTIVE DEVICES UTILIZED TO PREVENT FALLS:  Life alert? No  Use of a cane, walker or w/c? No  Grab bars in the bathroom? Yes  Shower chair or bench in shower? No  Elevated toilet seat or a handicapped toilet? Yes   TIMED UP AND GO:  Was the test performed? No .    Cognitive Function:  Normal cognitive status assessed by direct observation by this Nurse Health Advisor. No abnormalities found.          Immunizations Immunization History  Administered Date(s) Administered   Influenza Split 12/31/2010, 11/23/2012   Influenza Whole 11/11/2006, 11/14/2007, 11/30/2008   Influenza, High Dose Seasonal PF 12/06/2014, 11/18/2015, 11/16/2016, 11/24/2018   Influenza,inj,Quad PF,6+ Mos 11/22/2017   Influenza-Unspecified 11/23/2012, 11/22/2013, 11/22/2017, 11/24/2018, 01/23/2020   PFIZER(Purple Top)SARS-COV-2 Vaccination 03/03/2019, 03/24/2019, 03/14/2020   Pneumococcal Conjugate-13 12/28/2013   Pneumococcal Polysaccharide-23 01/08/2015   Td 02/09/2001   Tdap 12/22/2011   Zoster, Live 07/14/2011    TDAP status: Up to date  Flu Vaccine status: Up to date  Pneumococcal vaccine status: Due, Education has been provided regarding the importance of this vaccine. Advised may receive this vaccine at local pharmacy or Health Dept. Aware to provide a copy of the vaccination record if obtained from local pharmacy or  Health Dept.  Verbalized acceptance and understanding.  Covid-19 vaccine status: Information provided on how to obtain vaccines.   Qualifies for Shingles Vaccine? Yes   Zostavax completed Yes   Shingrix Completed?: No.    Education has been provided regarding the importance of this vaccine. Patient has been advised to call insurance company to determine out of pocket expense if they have not yet received this vaccine. Advised may also receive vaccine at local pharmacy or Health Dept. Verbalized acceptance and understanding.  Screening Tests Health Maintenance  Topic Date Due   Zoster Vaccines- Shingrix (1 of 2) Never done   INFLUENZA VACCINE  09/09/2020   MAMMOGRAM  05/01/2021   TETANUS/TDAP  12/21/2021   COLONOSCOPY (Pts 45-79yr Insurance coverage will need to be confirmed)  09/26/2024   DEXA SCAN  Completed   Hepatitis C Screening  Completed   PNA vac Low Risk Adult  Completed   HPV VACCINES  Aged Out   COVID-19 Vaccine  Discontinued    Health Maintenance  Health Maintenance Due  Topic Date Due   Zoster Vaccines- Shingrix (1 of 2) Never done   INFLUENZA VACCINE  09/09/2020    Colorectal cancer screening: Type of screening: Colonoscopy. Completed 2021. Repeat every 5 years  Mammogram status: Completed  . Repeat every year  Bone Density status: Completed  . Results reflect: Bone density results: OSTEOPOROSIS. Repeat every 2 years.  Lung Cancer Screening: (Low Dose CT Chest recommended if Age 72-80years, 30 pack-year currently smoking OR have quit w/in 15years.) does not qualify.   Lung Cancer Screening Referral:   Additional Screening:  Hepatitis C Screening: does not qualify; Completed   Vision Screening: Recommended annual ophthalmology exams for early detection of glaucoma and other disorders of the eye. Is the patient up to date with their annual eye exam?  Yes  Who is the provider or what is the name of the office in which the patient attends annual eye exams? [place] [facility]  is not established with a provider, would they like to be referred to a provider to establish care? No .   Dental Screening: Recommended annual dental exams for proper oral hygiene  Community Resource Referral / Chronic Care Management: CRR required this visit?  No   CCM required this visit?  No      Plan:     I have personally reviewed and noted the following in the patient's chart:   Medical and social history Use of alcohol, tobacco or illicit drugs  Current medications and supplements including opioid prescriptions.  Functional ability and status Nutritional status Physical activity Advanced directives List of other physicians Hospitalizations, surgeries, and ER visits in previous 12 months Vitals Screenings to include cognitive, depression, and falls Referrals and appointments  In addition, I have reviewed and discussed with patient certain preventive protocols, quality metrics, and best practice recommendations. A written personalized care plan for preventive services as well as general preventive health recommendations were provided to patient.     JLeroy Kennedy LPN   [id number]  Nurse Notes: na

## 2020-10-10 ENCOUNTER — Ambulatory Visit: Payer: Medicare PPO

## 2020-10-21 DIAGNOSIS — H43813 Vitreous degeneration, bilateral: Secondary | ICD-10-CM | POA: Diagnosis not present

## 2020-10-21 DIAGNOSIS — H0288A Meibomian gland dysfunction right eye, upper and lower eyelids: Secondary | ICD-10-CM | POA: Diagnosis not present

## 2020-10-21 DIAGNOSIS — H10823 Rosacea conjunctivitis, bilateral: Secondary | ICD-10-CM | POA: Diagnosis not present

## 2020-10-21 DIAGNOSIS — H0288B Meibomian gland dysfunction left eye, upper and lower eyelids: Secondary | ICD-10-CM | POA: Diagnosis not present

## 2020-10-30 DIAGNOSIS — F331 Major depressive disorder, recurrent, moderate: Secondary | ICD-10-CM | POA: Diagnosis not present

## 2020-10-31 ENCOUNTER — Ambulatory Visit: Payer: Medicare PPO | Admitting: Physical Therapy

## 2020-11-05 ENCOUNTER — Encounter: Payer: Self-pay | Admitting: Physical Therapy

## 2020-11-05 ENCOUNTER — Other Ambulatory Visit: Payer: Self-pay

## 2020-11-05 ENCOUNTER — Ambulatory Visit: Payer: Medicare PPO | Attending: Family Medicine | Admitting: Physical Therapy

## 2020-11-05 DIAGNOSIS — R252 Cramp and spasm: Secondary | ICD-10-CM | POA: Diagnosis not present

## 2020-11-05 DIAGNOSIS — R262 Difficulty in walking, not elsewhere classified: Secondary | ICD-10-CM | POA: Insufficient documentation

## 2020-11-05 DIAGNOSIS — M25675 Stiffness of left foot, not elsewhere classified: Secondary | ICD-10-CM | POA: Diagnosis not present

## 2020-11-05 DIAGNOSIS — M79672 Pain in left foot: Secondary | ICD-10-CM | POA: Insufficient documentation

## 2020-11-05 NOTE — Therapy (Signed)
[place] [place], [place], [address] Phone: [phone]   Fax:  [phone] Progress Note Reporting Period 07/02/20 to 11/05/20 for the first 10 visits  See note below for Objective Data and Assessment of Progress/Goals.     Physical Therapy Treatment  Patient Details  Name: [name] MRN: [id number] Date of Birth: 10-01-1950 Referring Provider (PT): Kremmer   Encounter Date: 11/05/2020   PT End of Session - 11/05/20 1749     Visit Number 10    Date for PT Re-Evaluation 01/31/21    Authorization Type [employer]    PT Start Time 1716    PT Stop Time 2919    PT Time Calculation (min) 41 min    Activity Tolerance Patient tolerated treatment well    Behavior During Therapy WFL for tasks assessed/performed             Past Medical History:  Diagnosis Date   Anal fissure    Anemia    during pregnancy   Anxiety    Basal cell carcinoma    DDD (degenerative disc disease)    Diverticulosis    Hypokalemia    LBP (low back pain)    Melanoma (HCC)    Migraine    MVP (mitral valve prolapse) 1990   psa   Rosacea    Ulcer    as a child    Past Surgical History:  Procedure Laterality Date   ABDOMINAL HYSTERECTOMY  1985   BREAST BIOPSY Left    secondary to infection   I & D EXTREMITY Right 09/09/2012   Procedure: Minor IRRIGATION AND DEBRIDEMENT EXTREMITY paronychia of right thumb;  Surgeon: Wynonia Sours, MD;  Location: [place];  Service: Orthopedics;  Laterality: Right;   I & D EXTREMITY Left 01/26/2014   Procedure: MINOR IINCISION AND DRAINAGE paronychia left index finger;  Surgeon: Daryll Brod, MD;  Location: [place];  Service: Orthopedics;  Laterality: Left;  left index   OOPHORECTOMY Left 1994   REPAIR KNEE LIGAMENT Left 1989   TONSILLECTOMY  1955   TUBAL LIGATION     VEIN SURGERY      There were no vitals filed for this visit.   Subjective Assessment -  11/05/20 1727     Subjective Patient reports overall she is doing much better but has has a few times that she has had catches in the leg on the left.  This was during walking.  She reports that she was doing very well until this    Currently in Pain? Yes    Pain Score 2     Pain Location Foot    Pain Orientation Left    Pain Descriptors / Indicators Sore;Tightness    Aggravating Factors  walking    Pain Relieving Factors stretches                [facility] PT Assessment - 11/05/20 0001       Assessment   Medical Diagnosis left plantar fascitis    Referring Provider (PT) Kremmer      AROM   Left Ankle Dorsiflexion 10    Left Ankle Eversion 12                           [facility] Adult PT Treatment/Exercise - 11/05/20 0001       Manual Therapy   Manual Therapy Soft tissue mobilization;Joint mobilization  Manual therapy comments toe stretches into flexion and extension    Joint Mobilization Mid foot joint mobs stiff mid foot    Soft tissue mobilization to the left plantar area and into the calf      Ankle Exercises: Stretches   Plantar Fascia Stretch 4 reps;20 seconds    Soleus Stretch 3 reps;20 seconds    Gastroc Stretch 4 reps;20 seconds    Other Stretch ITB, piriformis and HS stretches      Ankle Exercises: Supine   T-Band red all ankle motions      Ankle Exercises: Seated   [place] Pickup 20x                       PT Short Term Goals - 07/10/20 1524       PT SHORT TERM GOAL #1   Title independent with initial HEP    Status Achieved               PT Long Term Goals - 11/05/20 1759       PT LONG TERM GOAL #1   Title decrease pain 50%    Status Partially Met      PT LONG TERM GOAL #2   Title increase DF to 10 degrees    Status Achieved      PT LONG TERM GOAL #3   Title independent with RICE    Status Achieved      PT LONG TERM GOAL #4   Title no difficulty walking    Status Partially Met                    Plan - 11/05/20 1750     Clinical Impression Statement Patient overall is doing okay, she has a lot of stress in her life, her dad had a recent heart attack, he is recovering well, her husband was recently diagnosed with prostate cancer and is having surgery in the next few weeks, she has tried to start walking with him prior to the surgery and is having some increase in pain, reports a catch at times that makes her stop due to the pain.  She remains very tight in the HS, calves, ITB and piriformis mms.  I have given her HEP to do at home and she is doing but avoids the discomfort, the plantar fascia is less tender that it was and she seems to have better mid foot mobility    PT Frequency 1x / week    PT Duration 12 weeks    PT Treatment/Interventions ADLs/Self Care Home Management;Cryotherapy;Electrical Stimulation;Iontophoresis 84m/ml Dexamethasone;Ultrasound;Gait training;Neuromuscular re-education;Balance training;Therapeutic exercise;Therapeutic activities;Functional mobility training;Stair training;Patient/family education;Manual techniques    PT Next Visit Plan Patient has had a flare up with her pain after going back to walking, she has a lot of stress in her life and wants to stay on top of the pain, I do not feel that I will need to see her much but will renew the order and see her as needed especially with her husbands surgery coming up    Consulted and Agree with Plan of Care Patient             Patient will benefit from skilled therapeutic intervention in order to improve the following deficits and impairments:  Abnormal gait, Decreased range of motion, Difficulty walking, Increased fascial restricitons, Pain, Decreased balance, Impaired flexibility, Decreased strength  Visit Diagnosis: Pain in left foot - Plan: PT plan of care cert/re-cert  Stiffness  of left foot, not elsewhere classified - Plan: PT plan of care cert/re-cert  Difficulty in walking, not elsewhere  classified - Plan: PT plan of care cert/re-cert  Cramp and spasm - Plan: PT plan of care cert/re-cert     Problem List Patient Active Problem List   Diagnosis Date Noted   Labyrinthitis 07/30/2020   Encounter for counseling 07/19/2020   Palpitations 04/15/2020   Plantar fasciitis 04/15/2020   Dysthymia 04/15/2020   Vitamin D deficiency 04/15/2020   Gastroesophageal reflux disease 04/15/2020   Insect bite of left foot 11/24/2019   Pustule 11/24/2019   Chronic pain of right thumb 03/09/2019   External hemorrhoids 03/03/2018   Other chest pain 02/23/2018   Insomnia 02/24/2017   Healthcare maintenance 02/24/2017   Conjunctivitis 03/01/2014   Seborrheic keratosis, inflamed 01/05/2012   Postmenopausal disorder 12/22/2011   Skin cancer 07/14/2011   Migraine 05/14/2007   IRRITABLE BOWEL SYNDROME 05/14/2007   History of mitral valve prolapse 05/14/2007   Allergic rhinitis 04/07/2007   DERMATITIS DUE TO SOLAR RADIATION 04/07/2007   Premature beat 09/20/2006   DIVERTICULOSIS, COLON 04/13/2002    Sumner Boast, PT 11/05/2020, 6:03 PM  [place]. [place], [place], [address] Phone: [phone]   Fax:  [phone]  Name: [name] MRN: [id number] Date of Birth: 15-Jan-1951

## 2020-11-22 ENCOUNTER — Encounter: Payer: Self-pay | Admitting: Family Medicine

## 2020-12-09 ENCOUNTER — Telehealth: Payer: Self-pay | Admitting: Family Medicine

## 2020-12-10 ENCOUNTER — Other Ambulatory Visit: Payer: Self-pay

## 2020-12-10 ENCOUNTER — Ambulatory Visit (INDEPENDENT_AMBULATORY_CARE_PROVIDER_SITE_OTHER): Payer: Medicare PPO

## 2020-12-10 DIAGNOSIS — Z23 Encounter for immunization: Secondary | ICD-10-CM

## 2020-12-10 NOTE — Progress Notes (Signed)
After obtaining consent, and per orders of Dr. Ethelene Hal, injection of influenza high dose given left deltoid by Lynda Rainwater. Patient instructed to remain in clinic for 20 minutes afterwards, and to report any adverse reaction to me immediately.

## 2020-12-10 NOTE — Telephone Encounter (Signed)
Patient calling regarding husband who has an appointment for eval today.

## 2020-12-11 ENCOUNTER — Ambulatory Visit: Payer: Medicare PPO

## 2020-12-17 ENCOUNTER — Other Ambulatory Visit: Payer: Self-pay

## 2020-12-17 ENCOUNTER — Telehealth: Payer: Self-pay

## 2020-12-17 ENCOUNTER — Ambulatory Visit (INDEPENDENT_AMBULATORY_CARE_PROVIDER_SITE_OTHER): Payer: Self-pay | Admitting: Plastic Surgery

## 2020-12-17 ENCOUNTER — Other Ambulatory Visit: Payer: Medicare PPO | Admitting: Plastic Surgery

## 2020-12-17 ENCOUNTER — Encounter: Payer: Self-pay | Admitting: Plastic Surgery

## 2020-12-17 ENCOUNTER — Telehealth: Payer: Self-pay | Admitting: Plastic Surgery

## 2020-12-17 DIAGNOSIS — Z719 Counseling, unspecified: Secondary | ICD-10-CM

## 2020-12-17 NOTE — Progress Notes (Signed)
Preoperative Dx: leg veins  Postoperative Dx:  same  Procedure: laser to leg veins left   Anesthesia: none  Description of Procedure:  Risks and complications were explained to the patient. Consent was confirmed and signed. Time out was called and all information was confirmed to be correct. The area  area was prepped with alcohol and wiped dry. The BBL laser was set at 560 nm at 7 J and then body 10 J/cm2 with the spont. The left leg was lasered. The patient tolerated the procedure well and there were no complications. The patient is to follow up in 4 weeks.

## 2020-12-17 NOTE — Telephone Encounter (Signed)
Made in Error

## 2020-12-17 NOTE — Telephone Encounter (Signed)
Spoke to patient and informed No hot showers for 48 hours only luke warm. No shaving for 1 week. No chemical ie lotions, perfumes-can use cerave. Pt understood and agreed. [facility]

## 2020-12-17 NOTE — Telephone Encounter (Signed)
Patient would like a call back as she is not able to recall the post laser instructions such as how long she should keep the area dry and when she will be able to shave.

## 2020-12-20 ENCOUNTER — Telehealth: Payer: Self-pay

## 2020-12-20 NOTE — Telephone Encounter (Signed)
Spoke to [name]-She was wanting to double check the shower/walking situation. I told her walking is fine, no running.Marland Kitchen and showers-no baths. Pt understood.

## 2020-12-20 NOTE — Telephone Encounter (Signed)
Patient called with additional follow up questions such as how long the area will be sore for after her laser procedure on 12/17/2020. She would like to speak with Tresa Moore, [place].

## 2020-12-26 ENCOUNTER — Telehealth: Payer: Self-pay

## 2020-12-26 NOTE — Telephone Encounter (Signed)
Patient called to say she had laser on her leg on 12/17/20 and it's not doing well.  She would like for [place] to take a look at it.  Patient said she is out of town, but will send [place] a picture via [place].  Please call.

## 2020-12-31 NOTE — Telephone Encounter (Signed)
Pt spoke with [facility] and stated she will keep her Dec. 8th appointment.

## 2021-01-14 ENCOUNTER — Other Ambulatory Visit: Payer: Self-pay

## 2021-01-14 ENCOUNTER — Encounter: Payer: Self-pay | Admitting: Physical Therapy

## 2021-01-14 ENCOUNTER — Ambulatory Visit: Payer: Medicare PPO | Attending: Family Medicine | Admitting: Physical Therapy

## 2021-01-14 DIAGNOSIS — M79672 Pain in left foot: Secondary | ICD-10-CM | POA: Diagnosis not present

## 2021-01-14 DIAGNOSIS — R252 Cramp and spasm: Secondary | ICD-10-CM | POA: Insufficient documentation

## 2021-01-14 DIAGNOSIS — M25675 Stiffness of left foot, not elsewhere classified: Secondary | ICD-10-CM | POA: Insufficient documentation

## 2021-01-14 DIAGNOSIS — R262 Difficulty in walking, not elsewhere classified: Secondary | ICD-10-CM | POA: Insufficient documentation

## 2021-01-14 NOTE — Therapy (Signed)
[facility] [place]. [place], [place], [address] Phone: [phone]   Fax:  [phone]  Physical Therapy Treatment  Patient Details  Name: [name] MRN: [id number] Date of Birth: 02/19/1950 Referring Provider (PT): Kremmer   Encounter Date: 01/14/2021   PT End of Session - 01/14/21 1135     Visit Number 11    Number of Visits 16    Date for PT Re-Evaluation 02/08/21    Authorization Type Humana    PT Start Time 1050    PT Stop Time 6962    PT Time Calculation (min) 46 min    Activity Tolerance Patient tolerated treatment well    Behavior During Therapy WFL for tasks assessed/performed             Past Medical History:  Diagnosis Date   Anal fissure    Anemia    during pregnancy   Anxiety    Basal cell carcinoma    DDD (degenerative disc disease)    Diverticulosis    Hypokalemia    LBP (low back pain)    Melanoma (HCC)    Migraine    MVP (mitral valve prolapse) 1990   psa   Rosacea    Ulcer    as a child    Past Surgical History:  Procedure Laterality Date   ABDOMINAL HYSTERECTOMY  1985   BREAST BIOPSY Left    secondary to infection   I & D EXTREMITY Right 09/09/2012   Procedure: Minor IRRIGATION AND DEBRIDEMENT EXTREMITY paronychia of right thumb;  Surgeon: Wynonia Sours, MD;  Location: [facility];  Service: Orthopedics;  Laterality: Right;   I & D EXTREMITY Left 01/26/2014   Procedure: MINOR IINCISION AND DRAINAGE paronychia left index finger;  Surgeon: Daryll Brod, MD;  Location: [facility];  Service: Orthopedics;  Laterality: Left;  left index   OOPHORECTOMY Left 1994   REPAIR KNEE LIGAMENT Left 1989   TONSILLECTOMY  1955   TUBAL LIGATION     VEIN SURGERY      There were no vitals filed for this visit.   Subjective Assessment - 01/14/21 1133     Subjective Patient has had a lot of stress in her life, husband had surgery for prostate cancer, her dad was  hospitalized for a hear attack, she reports that overall she is doing okay but is still having pain in the foot mostly with the first minute of walking after she sits for > 30 minutes    Currently in Pain? Yes    Aggravating Factors  pain for the first minute of walking after sitting > 30 minutes is up to 8/10                               [facility] Adult PT Treatment/Exercise - 01/14/21 0001       Manual Therapy   Manual Therapy Soft tissue mobilization;Joint mobilization;Taping    Manual therapy comments toe stretches into flexion and extension    Joint Mobilization Mid foot joint mobs stiff mid foot    Soft tissue mobilization to the left plantar area and into the calf    McConnell two strips plantar fascia'for support      Ankle Exercises: Stretches   Plantar Fascia Stretch 4 reps;20 seconds    Soleus Stretch 3 reps;20 seconds    Gastroc Stretch 4 reps;20 seconds    Other  Stretch ITB, piriformis and HS stretches      Ankle Exercises: Seated   [place] Pickup 20x      Ankle Exercises: Supine   T-Band red all ankle motions                       PT Short Term Goals - 07/10/20 1524       PT SHORT TERM GOAL #1   Title independent with initial HEP    Status Achieved               PT Long Term Goals - 01/14/21 1139       PT LONG TERM GOAL #1   Title decrease pain 50%    Status Partially Met      PT LONG TERM GOAL #2   Title increase DF to 10 degrees    Status Achieved      PT LONG TERM GOAL #3   Title independent with RICE    Status Achieved      PT LONG TERM GOAL #4   Title no difficulty walking    Status Partially Met                   Plan - 01/14/21 1137     Clinical Impression Statement Patinet with a lot of stress in her life, has not been able to come in, she reports that she feels that she is doing better overall, still with pain up to 8/10 in the left plantar area of the foot with the first minute of walking  after sitting > 30 minutes.  She seems to be less tight and less tender, however she is still tender in the plantar fascia.  I taped her today for support of this area and really emphasized the need to stretch and wear proper shoes.    PT Next Visit Plan she will be out of town and busy with the holidays, she reports that she will call , we may need to request more visits for after the first of the year and her life settles down    Consulted and Agree with Plan of Care Patient             Patient will benefit from skilled therapeutic intervention in order to improve the following deficits and impairments:  Abnormal gait, Decreased range of motion, Difficulty walking, Increased fascial restricitons, Pain, Decreased balance, Impaired flexibility, Decreased strength  Visit Diagnosis: Pain in left foot  Difficulty in walking, not elsewhere classified  Cramp and spasm  Stiffness of left foot, not elsewhere classified     Problem List Patient Active Problem List   Diagnosis Date Noted   Labyrinthitis 07/30/2020   Encounter for counseling 07/19/2020   Palpitations 04/15/2020   Plantar fasciitis 04/15/2020   Dysthymia 04/15/2020   Vitamin D deficiency 04/15/2020   Gastroesophageal reflux disease 04/15/2020   Insect bite of left foot 11/24/2019   Pustule 11/24/2019   Chronic pain of right thumb 03/09/2019   External hemorrhoids 03/03/2018   Other chest pain 02/23/2018   Insomnia 02/24/2017   Healthcare maintenance 02/24/2017   Conjunctivitis 03/01/2014   Seborrheic keratosis, inflamed 01/05/2012   Postmenopausal disorder 12/22/2011   Skin cancer 07/14/2011   Migraine 05/14/2007   IRRITABLE BOWEL SYNDROME 05/14/2007   History of mitral valve prolapse 05/14/2007   Allergic rhinitis 04/07/2007   DERMATITIS DUE TO SOLAR RADIATION 04/07/2007   Premature beat 09/20/2006   DIVERTICULOSIS, COLON 04/13/2002  Sumner Boast, PT 01/14/2021, 11:40 AM  [place]. [place], [place], [address] Phone: [phone]   Fax:  [phone]  Name: [name] MRN: [id number] Date of Birth: [birth year]

## 2021-01-16 ENCOUNTER — Other Ambulatory Visit: Payer: Self-pay

## 2021-01-16 ENCOUNTER — Ambulatory Visit (INDEPENDENT_AMBULATORY_CARE_PROVIDER_SITE_OTHER): Payer: Medicare PPO | Admitting: Surgical

## 2021-01-16 DIAGNOSIS — Z719 Counseling, unspecified: Secondary | ICD-10-CM

## 2021-01-16 NOTE — Progress Notes (Signed)
Patient is a 70 year old female here for follow-up after laser of her legs for veins with Dr. Marla Roe on 12/17/2020.  Afterwards she had some concerns about the appearance of her legs and is here to be evaluated.  She is bothered by the appearance of her legs and the loss of color.  On exam she has some pigmentation loss in the areas where the laser was used for treatment of the leg vessels.  There is some pigmentation loss due to sun damage.  I do not see any evidence of any burns.  I discussed with the patient that the change in color is likely related to the laser targeting not only the leg vessels but also the pigmentation and sun damage on her lower extremities.  I discussed with her that  this may prove over the next few months.  Recommend keeping the area hydrated.  She has an appointment scheduled with Dr. Marla Roe on 03/07/2021 for additional evaluation and possible laser.  She was scheduled for halo laser on this date but may no longer want to do this and is interested in doing a chemical peel if possible instead.

## 2021-02-19 DIAGNOSIS — M180 Bilateral primary osteoarthritis of first carpometacarpal joints: Secondary | ICD-10-CM | POA: Diagnosis not present

## 2021-02-19 DIAGNOSIS — M79641 Pain in right hand: Secondary | ICD-10-CM | POA: Diagnosis not present

## 2021-02-19 DIAGNOSIS — R2231 Localized swelling, mass and lump, right upper limb: Secondary | ICD-10-CM | POA: Diagnosis not present

## 2021-03-03 ENCOUNTER — Other Ambulatory Visit: Payer: Self-pay

## 2021-03-03 ENCOUNTER — Encounter: Payer: Self-pay | Admitting: Family Medicine

## 2021-03-03 ENCOUNTER — Ambulatory Visit: Payer: Medicare PPO | Admitting: Family Medicine

## 2021-03-03 VITALS — BP 124/74 | HR 55 | Temp 97.4°F | Resp 18 | Wt 115.0 lb

## 2021-03-03 DIAGNOSIS — J309 Allergic rhinitis, unspecified: Secondary | ICD-10-CM | POA: Diagnosis not present

## 2021-03-03 DIAGNOSIS — T887XXA Unspecified adverse effect of drug or medicament, initial encounter: Secondary | ICD-10-CM

## 2021-03-03 DIAGNOSIS — H6121 Impacted cerumen, right ear: Secondary | ICD-10-CM | POA: Diagnosis not present

## 2021-03-03 MED ORDER — MOMETASONE FUROATE 50 MCG/ACT NA SUSP: 2.0000 | Freq: Every day | NASAL | 2 refills | Status: DC

## 2021-03-03 NOTE — Progress Notes (Signed)
Patient on today  Established Patient Office Visit  Subjective:  Patient ID: EMERI ESTILL, female    DOB: [birth year]  Age: 71 y.o. MRN: [id number]  CC:  Chief Complaint  Patient presents with   Acute Visit    Dry mouth ongoing x 3 weeks, pt states only difference in medication is she is currently taking zyrtec. Pt feels like right are is full of wax.     HPI CAMYLA CAMPOSANO presents for 2 to 3-week history of occasional sneeze nasal congestion and postnasal drip.  She thinks that her right ear may be clogged with wax.  She tried Zyrtec and developed a dry mouth.  History of ceruminosis.   Past Medical History:  Diagnosis Date   Anal fissure    Anemia    during pregnancy   Anxiety    Basal cell carcinoma    DDD (degenerative disc disease)    Diverticulosis    Hypokalemia    LBP (low back pain)    Melanoma (HCC)    Migraine    MVP (mitral valve prolapse) 1990   psa   Rosacea    Ulcer    as a child    Past Surgical History:  Procedure Laterality Date   ABDOMINAL HYSTERECTOMY  1985   BREAST BIOPSY Left    secondary to infection   I & D EXTREMITY Right 09/09/2012   Procedure: Minor IRRIGATION AND DEBRIDEMENT EXTREMITY paronychia of right thumb;  Surgeon: Wynonia Sours, MD;  Location: [facility];  Service: Orthopedics;  Laterality: Right;   I & D EXTREMITY Left 01/26/2014   Procedure: MINOR IINCISION AND DRAINAGE paronychia left index finger;  Surgeon: Daryll Brod, MD;  Location: [facility];  Service: Orthopedics;  Laterality: Left;  left index   OOPHORECTOMY Left 1994   REPAIR KNEE LIGAMENT Left 1989   TONSILLECTOMY  1955   TUBAL LIGATION     VEIN SURGERY      Family History  Problem Relation Age of Onset   Heart disease Mother    Colon polyps Mother    Heart disease Father    Colitis Father    Colon polyps Father    Esophageal cancer Maternal Aunt    Colon cancer Neg Hx     Social History   Socioeconomic History   Marital  status: Married    Spouse name: Not on file   Number of children: 2   Years of education: Not on file   Highest education level: Not on file  Occupational History   Occupation: retired school system  Tobacco Use   Smoking status: Never   Smokeless tobacco: Never  Vaping Use   Vaping Use: Never used  Substance and Sexual Activity   Alcohol use: Yes    Alcohol/week: 1.0 standard drink    Types: 1 Standard drinks or equivalent per week    Comment: social wine   Drug use: No   Sexual activity: Yes  Other Topics Concern   Not on file  Social History Narrative   Not on file   Social Determinants of Health   Financial Resource Strain: Low Risk    Difficulty of Paying Living Expenses: Not hard at all  Food Insecurity: No Food Insecurity   Worried About Charity fundraiser in the Last Year: Never true   [place] in the Last Year: Never true  Transportation Needs: No Transportation Needs   Lack of Transportation (Medical): No  Lack of Transportation (Non-Medical): No  Physical Activity: Insufficiently Active   Days of Exercise per Week: 3 days   Minutes of Exercise per Session: 30 min  Stress: No Stress Concern Present   Feeling of Stress : Not at all  Social Connections: Socially Integrated   Frequency of Communication with Friends and Family: More than three times a week   Frequency of Social Gatherings with Friends and Family: More than three times a week   Attends Religious Services: More than 4 times per year   Active Member of [employer] or Organizations: Yes   Attends Archivist Meetings: 1 to 4 times per year   Marital Status: Married  Human resources officer Violence: Not At Risk   Fear of Current or Ex-Partner: No   Emotionally Abused: No   Physically Abused: No   Sexually Abused: No    Outpatient Medications Prior to Visit  Medication Sig Dispense Refill   atenolol (TENORMIN) 25 MG tablet Take 0.5 tablets (12.5 mg total) by mouth daily as needed (for  palpitations). 15 tablet 0   calcium-vitamin D 250-100 MG-UNIT per tablet Take 1 tablet by mouth daily.     cycloSPORINE, PF, (CEQUA) 0.09 % SOLN Cequa 0.09 % eye drops in a dropperette     docusate sodium (COLACE) 100 MG capsule Take 100 mg by mouth daily. Daily at bedtime     doxycycline (VIBRA-TABS) 100 MG tablet Take 1 tablet (100 mg total) by mouth 2 (two) times daily. 20 tablet 0   meclizine (ANTIVERT) 12.5 MG tablet meclizine 12.5 mg tablet  one tablet every 6-12 hours as needed for dizziness     meloxicam (MOBIC) 15 MG tablet Take 1 tablet (15 mg total) by mouth as needed. 30 tablet 1   Multiple Vitamin (MULTIVITAMIN) tablet Take 1 tablet by mouth daily.     NON FORMULARY Hydro eye     zolmitriptan (ZOMIG-ZMT) 2.5 MG disintegrating tablet TAKE ONE TABLET BY MOUTH AS NEEDED FOR MIGRAINE 10 tablet 4   estradiol (ESTRACE) 0.5 MG tablet Take 0.5 mg by mouth daily.     hydrocortisone (ANUSOL-HC) 2.5 % rectal cream Place 1 application rectally 2 (two) times daily. 30 g 0   omeprazole (PRILOSEC) 20 MG capsule Take 1 capsule (20 mg total) by mouth daily. AS NEEDED (Patient not taking: Reported on 03/03/2021) 100 capsule 4   No facility-administered medications prior to visit.    Allergies  Allergen Reactions   Codeine    Compazine    Gabapentin    Prednisone     REACTION: makes heart beat hard \\T \ fast   Prochlorperazine Edisylate    Sulfa Antibiotics    Sulfonamide Derivatives     ROS Review of Systems  Constitutional:  Negative for chills, diaphoresis, fatigue, fever and unexpected weight change.  HENT:  Positive for congestion, postnasal drip, rhinorrhea and sneezing. Negative for ear pain, hearing loss, sinus pressure and sinus pain.   Eyes:  Negative for photophobia and visual disturbance.  Respiratory:  Negative for cough and wheezing.   Gastrointestinal: Negative.   Musculoskeletal: Negative.      Objective:    Physical Exam Vitals and nursing note reviewed.   Constitutional:      General: She is not in acute distress.    Appearance: Normal appearance. She is not ill-appearing, toxic-appearing or diaphoretic.  HENT:     Head: Normocephalic and atraumatic.     Right Ear: Tympanic membrane and external ear normal. There is impacted cerumen.  Left Ear: Tympanic membrane, ear canal and external ear normal. There is no impacted cerumen.     Mouth/Throat:     Mouth: Mucous membranes are moist.     Pharynx: Oropharynx is clear. No oropharyngeal exudate or posterior oropharyngeal erythema.  Eyes:     General: No scleral icterus.       Right eye: No discharge.        Left eye: No discharge.     Extraocular Movements: Extraocular movements intact.     Conjunctiva/sclera: Conjunctivae normal.     Pupils: Pupils are equal, round, and reactive to light.  Cardiovascular:     Rate and Rhythm: Normal rate and regular rhythm.  Pulmonary:     Effort: Pulmonary effort is normal.     Breath sounds: Normal breath sounds.  Musculoskeletal:     Cervical back: No rigidity or tenderness.  Lymphadenopathy:     Cervical: No cervical adenopathy.  Skin:    General: Skin is warm and dry.  Neurological:     Mental Status: She is alert and oriented to person, place, and time.  Psychiatric:        Mood and Affect: Mood normal.        Behavior: Behavior normal.   Subjective:    MAKENZIE WEISNER is a 71 y.o. female whom I am asked to see for evaluation of pruritis in the right ear for the past 6 month. There is a prior history of cerumen impaction. The patient has not been using ear drops to loosen wax immediately prior to this visit. The patient denies ear pain.  The patient's history has been marked as reviewed and updated as appropriate.  Review of Systems Pertinent items are noted in HPI.    Objective:    Patient gave verbal consent for irrigation.  She understands that there is a small risk of injury to the TM.   Auditory canal(s) of the right ear  are partially obstructed with cerumen.   Cerumen was removed using gentle irrigation. Tympanic membranes are intact following the procedure.  Auditory canals are normal.  Were unsuccessful removing all of the wax with irrigation and soft curette.  Patient reported pain with irrigation and I probably stopped   Assessment:    Cerumen Impaction without otitis externa.    Plan:    1. Care instructions given. 2. Home treatment: none. 3. Follow-up as needed.   There were no vitals taken for this visit. Wt Readings from Last 3 Encounters:  08/06/20 116 lb (52.6 kg)  07/30/20 114 lb (51.7 kg)  06/07/20 115 lb 6.4 oz (52.3 kg)     Health Maintenance Due  Topic Date Due   Zoster Vaccines- Shingrix (1 of 2) Never done    There are no preventive care reminders to display for this patient.  Lab Results  Component Value Date   TSH 1.14 04/15/2020   Lab Results  Component Value Date   WBC 9.4 04/15/2020   HGB 13.6 04/15/2020   HCT 40.0 04/15/2020   MCV 93.9 04/15/2020   PLT 271.0 04/15/2020   Lab Results  Component Value Date   NA 140 04/15/2020   K 4.5 04/15/2020   CO2 31 04/15/2020   GLUCOSE 105 (H) 04/15/2020   BUN 15 04/15/2020   CREATININE 0.69 04/15/2020   BILITOT 0.5 04/15/2020   ALKPHOS 58 04/15/2020   AST 17 04/15/2020   ALT 13 04/15/2020   PROT 7.1 04/15/2020   ALBUMIN 4.3 04/15/2020   CALCIUM  10.1 04/15/2020   ANIONGAP 8 02/22/2018   GFR 87.22 04/15/2020   Lab Results  Component Value Date   CHOL 183 04/15/2020   Lab Results  Component Value Date   HDL 74.90 04/15/2020   Lab Results  Component Value Date   LDLCALC 80 04/15/2020   Lab Results  Component Value Date   TRIG 139.0 04/15/2020   Lab Results  Component Value Date   CHOLHDL 2 04/15/2020   No results found for: HGBA1C    Assessment & Plan:   Problem List Items Addressed This Visit       Respiratory   Allergic rhinitis   Relevant Medications   mometasone (NASONEX) 50 MCG/ACT  nasal spray     Nervous and Auditory   Excessive cerumen in right ear canal   Relevant Orders   Ambulatory referral to ENT     Other   Medication side effect - Primary    Meds ordered this encounter  Medications   mometasone (NASONEX) 50 MCG/ACT nasal spray    Sig: Place 2 sprays into the nose daily.    Dispense:  1 each    Refill:  2    Follow-up: Return Has appointment for physical in March.Libby Maw, MD

## 2021-03-04 DIAGNOSIS — H9011 Conductive hearing loss, unilateral, right ear, with unrestricted hearing on the contralateral side: Secondary | ICD-10-CM | POA: Insufficient documentation

## 2021-03-04 DIAGNOSIS — H6121 Impacted cerumen, right ear: Secondary | ICD-10-CM | POA: Diagnosis not present

## 2021-03-05 ENCOUNTER — Ambulatory Visit: Payer: Medicare PPO | Attending: Family Medicine | Admitting: Physical Therapy

## 2021-03-05 ENCOUNTER — Other Ambulatory Visit: Payer: Self-pay

## 2021-03-05 ENCOUNTER — Encounter: Payer: Self-pay | Admitting: Physical Therapy

## 2021-03-05 DIAGNOSIS — M25675 Stiffness of left foot, not elsewhere classified: Secondary | ICD-10-CM | POA: Diagnosis not present

## 2021-03-05 DIAGNOSIS — R252 Cramp and spasm: Secondary | ICD-10-CM

## 2021-03-05 DIAGNOSIS — M542 Cervicalgia: Secondary | ICD-10-CM | POA: Diagnosis not present

## 2021-03-05 DIAGNOSIS — M79672 Pain in left foot: Secondary | ICD-10-CM | POA: Diagnosis not present

## 2021-03-05 DIAGNOSIS — R262 Difficulty in walking, not elsewhere classified: Secondary | ICD-10-CM

## 2021-03-05 NOTE — Therapy (Signed)
[place]. [place], [place], [address] Phone: [phone]   Fax:  [phone]  Physical Therapy Treatment  Patient Details  Name: [name] MRN: [id number] Date of Birth: [birth year] Referring Provider (PT): Kremmer   Encounter Date: 03/05/2021   PT End of Session - 03/05/21 1341     Visit Number 12    Date for PT Re-Evaluation 05/16/21    Authorization Type [employer]    Authorization Time Period 1/12    Authorization - Visit Number 1    Authorization - Number of Visits 12    PT Start Time 8638    PT Stop Time 1342    PT Time Calculation (min) 45 min    Activity Tolerance Patient tolerated treatment well    Behavior During Therapy [facility] for tasks assessed/performed             Past Medical History:  Diagnosis Date   Anal fissure    Anemia    during pregnancy   Anxiety    Basal cell carcinoma    DDD (degenerative disc disease)    Diverticulosis    Hypokalemia    LBP (low back pain)    Melanoma (HCC)    Migraine    MVP (mitral valve prolapse) 1990   psa   Rosacea    Ulcer    as a child    Past Surgical History:  Procedure Laterality Date   ABDOMINAL HYSTERECTOMY  1985   BREAST BIOPSY Left    secondary to infection   I & D EXTREMITY Right 09/09/2012   Procedure: Minor IRRIGATION AND DEBRIDEMENT EXTREMITY paronychia of right thumb;  Surgeon: Wynonia Sours, MD;  Location: [facility];  Service: Orthopedics;  Laterality: Right;   I & D EXTREMITY Left 01/26/2014   Procedure: MINOR IINCISION AND DRAINAGE paronychia left index finger;  Surgeon: Daryll Brod, MD;  Location: [facility];  Service: Orthopedics;  Laterality: Left;  left index   OOPHORECTOMY Left 1994   REPAIR KNEE LIGAMENT Left 1989   TONSILLECTOMY  1955   TUBAL LIGATION     VEIN SURGERY      There were no vitals filed for this visit.   Subjective Assessment - 03/05/21 1301     Subjective Patient  overall reports that she is doing well, reports everynow and then will have left hip catch or a shooting pain, reports happens about once a day    Currently in Pain? Yes    Pain Score 3     Pain Location Hip    Pain Orientation Left    Pain Descriptors / Indicators Sore;Tightness    Aggravating Factors  walking                               [facility] Adult PT Treatment/Exercise - 03/05/21 0001       Manual Therapy   Soft tissue mobilization to the left plantar area and into the calf    McConnell two strips plantar fascia'for support      Ankle Exercises: Stretches   Plantar Fascia Stretch 4 reps;20 seconds    Gastroc Stretch 4 reps;20 seconds    Other Stretch ITB, piriformis and HS stretches      Ankle Exercises: Supine   T-Band red all ankle motions      Ankle Exercises: Aerobic   Recumbent Bike level 2 x 6 minutes  PT Short Term Goals - 07/10/20 1524       PT SHORT TERM GOAL #1   Title independent with initial HEP    Status Achieved               PT Long Term Goals - 03/05/21 1345       PT LONG TERM GOAL #1   Title decrease pain 50%    Status Partially Met      PT LONG TERM GOAL #2   Title increase DF to 10 degrees    Status Achieved      PT LONG TERM GOAL #3   Title independent with RICE    Status Achieved      PT LONG TERM GOAL #4   Title no difficulty walking    Status Partially Met                   Plan - 03/05/21 1342     Clinical Impression Statement Patient reports that she really feels like the foot and ankle are doing bvetter, her biggest c/o today was tightness in the left hip, reports about once a day she will have a pain and a shooting pain in the buttock and down the leg, she is very tight in th eITB, HS and piriformis mms on this side.  I gave her piriformis stretches to do at home and cautioned her to go easy, again very tight and tender in this area, could be from some  compensation due to the foot pain    PT Next Visit Plan Patient is doing well, we did the renewal last time to assure that she continues to progress and it appears she is but having some collateral issues from compensation    Consulted and Agree with Plan of Care Patient             Patient will benefit from skilled therapeutic intervention in order to improve the following deficits and impairments:  Abnormal gait, Decreased range of motion, Difficulty walking, Increased fascial restricitons, Pain, Decreased balance, Impaired flexibility, Decreased strength  Visit Diagnosis: Pain in left foot  Difficulty in walking, not elsewhere classified  Cramp and spasm  Stiffness of left foot, not elsewhere classified  Cervicalgia     Problem List Patient Active Problem List   Diagnosis Date Noted   Medication side effect 03/03/2021   Excessive cerumen in right ear canal 03/03/2021   Labyrinthitis 07/30/2020   Encounter for counseling 07/19/2020   Palpitations 04/15/2020   Plantar fasciitis 04/15/2020   Dysthymia 04/15/2020   Vitamin D deficiency 04/15/2020   Gastroesophageal reflux disease 04/15/2020   Insect bite of left foot 11/24/2019   Pustule 11/24/2019   Chronic pain of right thumb 03/09/2019   External hemorrhoids 03/03/2018   Other chest pain 02/23/2018   Insomnia 02/24/2017   Healthcare maintenance 02/24/2017   Conjunctivitis 03/01/2014   Seborrheic keratosis, inflamed 01/05/2012   Postmenopausal disorder 12/22/2011   Skin cancer 07/14/2011   Migraine 05/14/2007   IRRITABLE BOWEL SYNDROME 05/14/2007   History of mitral valve prolapse 05/14/2007   Allergic rhinitis 04/07/2007   DERMATITIS DUE TO SOLAR RADIATION 04/07/2007   Premature beat 09/20/2006   DIVERTICULOSIS, COLON 04/13/2002    Sumner Boast, PT 03/05/2021, 1:45 PM  [place]. [place], [place], [address] Phone: [phone]    Fax:  [phone]  Name: [name] MRN: [id number] Date of Birth: Oct 21, 1950

## 2021-03-06 DIAGNOSIS — R2231 Localized swelling, mass and lump, right upper limb: Secondary | ICD-10-CM | POA: Diagnosis not present

## 2021-03-06 DIAGNOSIS — M79641 Pain in right hand: Secondary | ICD-10-CM | POA: Diagnosis not present

## 2021-03-07 ENCOUNTER — Other Ambulatory Visit: Payer: Medicare PPO | Admitting: Plastic Surgery

## 2021-03-07 ENCOUNTER — Encounter: Payer: Self-pay | Admitting: Plastic Surgery

## 2021-03-07 ENCOUNTER — Ambulatory Visit (INDEPENDENT_AMBULATORY_CARE_PROVIDER_SITE_OTHER): Payer: Self-pay | Admitting: Plastic Surgery

## 2021-03-07 ENCOUNTER — Telehealth: Payer: Self-pay

## 2021-03-07 ENCOUNTER — Other Ambulatory Visit: Payer: Self-pay

## 2021-03-07 DIAGNOSIS — Z719 Counseling, unspecified: Secondary | ICD-10-CM

## 2021-03-07 NOTE — Progress Notes (Signed)
The patient is a 71 yrs old female here for follow up after laser to her legs.  It created hypopigmentation.  Recommend waiting for the summer to pass and then we will re-evaluate it.  She is in agreement.

## 2021-03-07 NOTE — Telephone Encounter (Signed)
Returned patients call. Under the advise from Dr. Marla Roe, apply the Retinoid after cleanser at PM only. Should her face feels dry, use cream every other night. Patient uses [employer]'s 7 day scrub for cleanser that has a exfoliating ingredient in it. Suggested not to use while using the Retinoid.  May use a gentle cleanser during. Wash face in the AM and apply a moisturizer. Patient understood and agreed with plan.

## 2021-03-07 NOTE — Telephone Encounter (Signed)
Patient would like to have step-by-step instructions on how to use her Sente Bio Complete Serum that she purchased today.  Patient would like to know if she should use moisturizer before or after the serum.  She will start using it tonight, so she asked that we please call her today.

## 2021-03-12 ENCOUNTER — Other Ambulatory Visit: Payer: Self-pay

## 2021-03-12 ENCOUNTER — Encounter: Payer: Self-pay | Admitting: Physical Therapy

## 2021-03-12 ENCOUNTER — Ambulatory Visit: Payer: Medicare PPO | Attending: Family Medicine | Admitting: Physical Therapy

## 2021-03-12 DIAGNOSIS — R252 Cramp and spasm: Secondary | ICD-10-CM | POA: Insufficient documentation

## 2021-03-12 DIAGNOSIS — M25675 Stiffness of left foot, not elsewhere classified: Secondary | ICD-10-CM | POA: Diagnosis not present

## 2021-03-12 DIAGNOSIS — M542 Cervicalgia: Secondary | ICD-10-CM | POA: Diagnosis not present

## 2021-03-12 DIAGNOSIS — M79672 Pain in left foot: Secondary | ICD-10-CM | POA: Diagnosis not present

## 2021-03-12 DIAGNOSIS — R262 Difficulty in walking, not elsewhere classified: Secondary | ICD-10-CM | POA: Diagnosis not present

## 2021-03-12 DIAGNOSIS — R2231 Localized swelling, mass and lump, right upper limb: Secondary | ICD-10-CM | POA: Diagnosis not present

## 2021-03-12 DIAGNOSIS — M180 Bilateral primary osteoarthritis of first carpometacarpal joints: Secondary | ICD-10-CM | POA: Diagnosis not present

## 2021-03-12 NOTE — Therapy (Signed)
[place]. [place], [place], [address] Phone: [phone]   Fax:  [phone]  Physical Therapy Treatment  Patient Details  Name: [name] MRN: [id number] Date of Birth: 1950/08/26 Referring Provider (PT): Kremmer   Encounter Date: 03/12/2021   PT End of Session - 03/12/21 1433     Visit Number 13    Date for PT Re-Evaluation 05/16/21    Authorization Type [employer]    Authorization Time Period 2/12    PT Start Time 1307    PT Stop Time 1350    PT Time Calculation (min) 43 min    Activity Tolerance Patient tolerated treatment well    Behavior During Therapy [facility] for tasks assessed/performed             Past Medical History:  Diagnosis Date   Anal fissure    Anemia    during pregnancy   Anxiety    Basal cell carcinoma    DDD (degenerative disc disease)    Diverticulosis    Hypokalemia    LBP (low back pain)    Melanoma (HCC)    Migraine    MVP (mitral valve prolapse) 1990   psa   Rosacea    Ulcer    as a child    Past Surgical History:  Procedure Laterality Date   ABDOMINAL HYSTERECTOMY  1985   BREAST BIOPSY Left    secondary to infection   I & D EXTREMITY Right 09/09/2012   Procedure: Minor IRRIGATION AND DEBRIDEMENT EXTREMITY paronychia of right thumb;  Surgeon: Wynonia Sours, MD;  Location: [place];  Service: Orthopedics;  Laterality: Right;   I & D EXTREMITY Left 01/26/2014   Procedure: MINOR IINCISION AND DRAINAGE paronychia left index finger;  Surgeon: Daryll Brod, MD;  Location: [place];  Service: Orthopedics;  Laterality: Left;  left index   OOPHORECTOMY Left 1994   REPAIR KNEE LIGAMENT Left 1989   TONSILLECTOMY  1955   TUBAL LIGATION     VEIN SURGERY      There were no vitals filed for this visit.   Subjective Assessment - 03/12/21 1421     Subjective Patient reports that she is doing well, had some concerns about balance and would like  some exericses    Currently in Pain? Yes    Pain Score 1     Pain Location Neck                               [facility] Adult PT Treatment/Exercise - 03/12/21 0001       High Level Balance   High Level Balance Activities Side stepping;Backward walking;Direction changes    High Level Balance Comments on airex narrow BOS, head turns, up and down, eyes closed and the head turns                     PT Education - 03/12/21 1433     Education Details gave handout about high level balance activities and went over with her and how to perform all    Person(s) Educated Patient    Methods Explanation;Demonstration;Handout;Verbal cues    Comprehension Verbalized understanding;Returned demonstration;Verbal cues required              PT Short Term Goals - 07/10/20 1524       PT SHORT TERM GOAL #1   Title independent with  initial HEP    Status Achieved               PT Long Term Goals - 03/12/21 1437       PT LONG TERM GOAL #1   Title decrease pain 50%    Status Partially Met                   Plan - 03/12/21 1434     Clinical Impression Statement Patient doing well had concerns about balance.  I did work with her on this and really gave a thorough HEP for this.  We went over HEP and perfromed all, has some difficulty with the high level stuff    PT Next Visit Plan see how the balance does    Consulted and Agree with Plan of Care Patient             Patient will benefit from skilled therapeutic intervention in order to improve the following deficits and impairments:  Abnormal gait, Decreased range of motion, Difficulty walking, Increased fascial restricitons, Pain, Decreased balance, Impaired flexibility, Decreased strength  Visit Diagnosis: Pain in left foot  Difficulty in walking, not elsewhere classified  Cramp and spasm  Stiffness of left foot, not elsewhere classified     Problem List Patient Active Problem List    Diagnosis Date Noted   Medication side effect 03/03/2021   Excessive cerumen in right ear canal 03/03/2021   Labyrinthitis 07/30/2020   Encounter for counseling 07/19/2020   Palpitations 04/15/2020   Plantar fasciitis 04/15/2020   Dysthymia 04/15/2020   Vitamin D deficiency 04/15/2020   Gastroesophageal reflux disease 04/15/2020   Insect bite of left foot 11/24/2019   Pustule 11/24/2019   Chronic pain of right thumb 03/09/2019   External hemorrhoids 03/03/2018   Other chest pain 02/23/2018   Insomnia 02/24/2017   Healthcare maintenance 02/24/2017   Conjunctivitis 03/01/2014   Seborrheic keratosis, inflamed 01/05/2012   Postmenopausal disorder 12/22/2011   Skin cancer 07/14/2011   Migraine 05/14/2007   IRRITABLE BOWEL SYNDROME 05/14/2007   History of mitral valve prolapse 05/14/2007   Allergic rhinitis 04/07/2007   DERMATITIS DUE TO SOLAR RADIATION 04/07/2007   Premature beat 09/20/2006   DIVERTICULOSIS, COLON 04/13/2002    Sumner Boast, PT 03/12/2021, 2:38 PM  [place]. [place], [place], [address] Phone: [phone]   Fax:  [phone]  Name: [name] MRN: [id number] Date of Birth: [birth year]

## 2021-03-17 ENCOUNTER — Other Ambulatory Visit: Payer: Self-pay | Admitting: Family Medicine

## 2021-03-17 DIAGNOSIS — K644 Residual hemorrhoidal skin tags: Secondary | ICD-10-CM

## 2021-03-18 MED ORDER — HYDROCORTISONE (PERIANAL) 2.5 % EX CREA: 1.0000 "application " | TOPICAL_CREAM | Freq: Two times a day (BID) | CUTANEOUS | 0 refills | Status: DC

## 2021-03-19 ENCOUNTER — Encounter: Payer: Self-pay | Admitting: Physical Therapy

## 2021-03-19 ENCOUNTER — Other Ambulatory Visit: Payer: Self-pay

## 2021-03-19 ENCOUNTER — Ambulatory Visit: Payer: Medicare PPO | Admitting: Physical Therapy

## 2021-03-19 DIAGNOSIS — M25675 Stiffness of left foot, not elsewhere classified: Secondary | ICD-10-CM

## 2021-03-19 DIAGNOSIS — M79672 Pain in left foot: Secondary | ICD-10-CM

## 2021-03-19 DIAGNOSIS — R262 Difficulty in walking, not elsewhere classified: Secondary | ICD-10-CM | POA: Diagnosis not present

## 2021-03-19 DIAGNOSIS — M542 Cervicalgia: Secondary | ICD-10-CM

## 2021-03-19 DIAGNOSIS — R252 Cramp and spasm: Secondary | ICD-10-CM

## 2021-03-19 NOTE — Therapy (Signed)
[place]. [place], [place], [address] Phone: [phone]   Fax:  [phone]  Physical Therapy Treatment  Patient Details  Name: [name] MRN: [id number] Date of Birth: 11/09/1950 Referring Provider (PT): Kremmer   Encounter Date: 03/19/2021   PT End of Session - 03/19/21 1148     Visit Number 14    Date for PT Re-Evaluation 05/16/21    Authorization Type [employer]    Authorization Time Period 3/12    PT Start Time 1057    PT Stop Time 1145    PT Time Calculation (min) 48 min    Activity Tolerance Patient tolerated treatment well    Behavior During Therapy [facility] for tasks assessed/performed             Past Medical History:  Diagnosis Date   Anal fissure    Anemia    during pregnancy   Anxiety    Basal cell carcinoma    DDD (degenerative disc disease)    Diverticulosis    Hypokalemia    LBP (low back pain)    Melanoma (HCC)    Migraine    MVP (mitral valve prolapse) 1990   psa   Rosacea    Ulcer    as a child    Past Surgical History:  Procedure Laterality Date   ABDOMINAL HYSTERECTOMY  1985   BREAST BIOPSY Left    secondary to infection   I & D EXTREMITY Right 09/09/2012   Procedure: Minor IRRIGATION AND DEBRIDEMENT EXTREMITY paronychia of right thumb;  Surgeon: Wynonia Sours, MD;  Location: [place];  Service: Orthopedics;  Laterality: Right;   I & D EXTREMITY Left 01/26/2014   Procedure: MINOR IINCISION AND DRAINAGE paronychia left index finger;  Surgeon: Daryll Brod, MD;  Location: [place];  Service: Orthopedics;  Laterality: Left;  left index   OOPHORECTOMY Left 1994   REPAIR KNEE LIGAMENT Left 1989   TONSILLECTOMY  1955   TUBAL LIGATION     VEIN SURGERY      There were no vitals filed for this visit.   Subjective Assessment - 03/19/21 1103     Subjective Just a little sore, feel stiff, need to see if I can do some exercises for this     Currently in Pain? Yes    Pain Score 2     Pain Location Neck    Pain Descriptors / Indicators Sore;Tightness    Aggravating Factors  exercise                               [facility] Adult PT Treatment/Exercise - 03/19/21 0001       Ankle Exercises: Aerobic   Recumbent Bike level 3 x 6 minutes      Ankle Exercises: Stretches   Gastroc Stretch 4 reps;20 seconds      Ankle Exercises: Standing   Other Standing Ankle Exercises 5# straight arm pulls with cues for posture and core    Other Standing Ankle Exercises 10# farmer's carry, , 3# overhead carry      Ankle Exercises: Supine   T-Band red all ankle motions      Ankle Exercises: Seated   Other Seated Ankle Exercises quadraped hip exercises, isometric ab work for core                       PT  Short Term Goals - 07/10/20 1524       PT SHORT TERM GOAL #1   Title independent with initial HEP    Status Achieved               PT Long Term Goals - 03/19/21 1151       PT LONG TERM GOAL #1   Title decrease pain 50%    Status Partially Met      PT LONG TERM GOAL #2   Title increase DF to 10 degrees    Status Achieved                   Plan - 03/19/21 1149     Clinical Impression Statement Patinet still doing well, has concerns about balance and stiffness and body strength.  She reports some lateral foot pain, she remains very tight in the calves and th eLE's, when asked about her stretching, she said she isnot doing this and thought her moving more was better.  We went over how important the flexibility is.  Started working with her on advanced HEP for total body acitvity    PT Next Visit Plan continue to work iwth her to help advance her function    Consulted and Agree with Plan of Care Patient             Patient will benefit from skilled therapeutic intervention in order to improve the following deficits and impairments:  Abnormal gait, Decreased range of motion,  Difficulty walking, Increased fascial restricitons, Pain, Decreased balance, Impaired flexibility, Decreased strength  Visit Diagnosis: Pain in left foot  Difficulty in walking, not elsewhere classified  Cramp and spasm  Stiffness of left foot, not elsewhere classified  Cervicalgia     Problem List Patient Active Problem List   Diagnosis Date Noted   Medication side effect 03/03/2021   Excessive cerumen in right ear canal 03/03/2021   Labyrinthitis 07/30/2020   Encounter for counseling 07/19/2020   Palpitations 04/15/2020   Plantar fasciitis 04/15/2020   Dysthymia 04/15/2020   Vitamin D deficiency 04/15/2020   Gastroesophageal reflux disease 04/15/2020   Insect bite of left foot 11/24/2019   Pustule 11/24/2019   Chronic pain of right thumb 03/09/2019   External hemorrhoids 03/03/2018   Other chest pain 02/23/2018   Insomnia 02/24/2017   Healthcare maintenance 02/24/2017   Conjunctivitis 03/01/2014   Seborrheic keratosis, inflamed 01/05/2012   Postmenopausal disorder 12/22/2011   Skin cancer 07/14/2011   Migraine 05/14/2007   IRRITABLE BOWEL SYNDROME 05/14/2007   History of mitral valve prolapse 05/14/2007   Allergic rhinitis 04/07/2007   DERMATITIS DUE TO SOLAR RADIATION 04/07/2007   Premature beat 09/20/2006   DIVERTICULOSIS, COLON 04/13/2002    Sumner Boast, PT 03/19/2021, 11:52 AM  [place]. [place], [place], [address] Phone: [phone]   Fax:  [phone]  Name: LU PARADISE MRN: [id number] Date of Birth: Jun 30, 1950

## 2021-03-27 ENCOUNTER — Other Ambulatory Visit: Payer: Self-pay

## 2021-03-27 ENCOUNTER — Encounter: Payer: Self-pay | Admitting: Physical Therapy

## 2021-03-27 ENCOUNTER — Ambulatory Visit: Payer: Medicare PPO | Admitting: Physical Therapy

## 2021-03-27 DIAGNOSIS — M25675 Stiffness of left foot, not elsewhere classified: Secondary | ICD-10-CM

## 2021-03-27 DIAGNOSIS — R252 Cramp and spasm: Secondary | ICD-10-CM | POA: Diagnosis not present

## 2021-03-27 DIAGNOSIS — M542 Cervicalgia: Secondary | ICD-10-CM | POA: Diagnosis not present

## 2021-03-27 DIAGNOSIS — M79672 Pain in left foot: Secondary | ICD-10-CM | POA: Diagnosis not present

## 2021-03-27 DIAGNOSIS — R262 Difficulty in walking, not elsewhere classified: Secondary | ICD-10-CM | POA: Diagnosis not present

## 2021-03-27 NOTE — Therapy (Signed)
[place]. [place], [place], [address] Phone: [phone]   Fax:  [phone]  Physical Therapy Treatment  Patient Details  Name: [name] MRN: [id number] Date of Birth: 12/09/1950 Referring Provider (PT): Kremmer   Encounter Date: 03/27/2021   PT End of Session - 03/27/21 1608     Visit Number 15    Date for PT Re-Evaluation 05/16/21    Authorization Type [employer]    Authorization Time Period 4/12    PT Start Time 1525    PT Stop Time 1608    PT Time Calculation (min) 43 min    Activity Tolerance Patient tolerated treatment well    Behavior During Therapy [facility] for tasks assessed/performed             Past Medical History:  Diagnosis Date   Anal fissure    Anemia    during pregnancy   Anxiety    Basal cell carcinoma    DDD (degenerative disc disease)    Diverticulosis    Hypokalemia    LBP (low back pain)    Melanoma (HCC)    Migraine    MVP (mitral valve prolapse) 1990   psa   Rosacea    Ulcer    as a child    Past Surgical History:  Procedure Laterality Date   ABDOMINAL HYSTERECTOMY  1985   BREAST BIOPSY Left    secondary to infection   I & D EXTREMITY Right 09/09/2012   Procedure: Minor IRRIGATION AND DEBRIDEMENT EXTREMITY paronychia of right thumb;  Surgeon: Wynonia Sours, MD;  Location: [place];  Service: Orthopedics;  Laterality: Right;   I & D EXTREMITY Left 01/26/2014   Procedure: MINOR IINCISION AND DRAINAGE paronychia left index finger;  Surgeon: Daryll Brod, MD;  Location: [place];  Service: Orthopedics;  Laterality: Left;  left index   OOPHORECTOMY Left 1994   REPAIR KNEE LIGAMENT Left 1989   TONSILLECTOMY  1955   TUBAL LIGATION     VEIN SURGERY      There were no vitals filed for this visit.   Subjective Assessment - 03/27/21 1529     Subjective I went to the beach I walked most everyday, I did well until the drive home, started  hurting in my feet    Currently in Pain? Yes    Pain Score 3     Pain Location Foot    Pain Orientation Right    Pain Descriptors / Indicators Sore    Aggravating Factors  riding in the car                               [facility] Adult PT Treatment/Exercise - 03/27/21 0001       Ankle Exercises: Aerobic   Nustep level 5 x 5 minutes      Ankle Exercises: Standing   Other Standing Ankle Exercises 5# straight arm pulls with cues for posture and core, rows, lats 15# 2x10      Ankle Exercises: Supine   T-Band red all ankle motions      Ankle Exercises: Stretches   Gastroc Stretch 4 reps;20 seconds    Other Stretch ITB, piriformis and HS stretches      Ankle Exercises: Seated   Other Seated Ankle Exercises isometric abs  PT Short Term Goals - 07/10/20 1524       PT SHORT TERM GOAL #1   Title independent with initial HEP    Status Achieved               PT Long Term Goals - 03/27/21 1610       PT LONG TERM GOAL #1   Title decrease pain 50%    Status Partially Met      PT LONG TERM GOAL #2   Title increase DF to 10 degrees    Status Achieved                   Plan - 03/27/21 1608     Clinical Impression Statement Patient with some back and foot pain, did a lot of walking at the beach and then reported that she sat in car all day driving home.  I can't get her to lie down to stretch due to dizziness , I tried to do with head elevated but she got dizzy with this.She has thumb issues so with the UE exercises we have to protect the thumbs    PT Next Visit Plan continue to work iwth her to help advance her function    Consulted and Agree with Plan of Care Patient             Patient will benefit from skilled therapeutic intervention in order to improve the following deficits and impairments:  Abnormal gait, Decreased range of motion, Difficulty walking, Increased fascial restricitons, Pain, Decreased  balance, Impaired flexibility, Decreased strength  Visit Diagnosis: Pain in left foot  Difficulty in walking, not elsewhere classified  Cramp and spasm  Stiffness of left foot, not elsewhere classified  Cervicalgia     Problem List Patient Active Problem List   Diagnosis Date Noted   Medication side effect 03/03/2021   Excessive cerumen in right ear canal 03/03/2021   Labyrinthitis 07/30/2020   Encounter for counseling 07/19/2020   Palpitations 04/15/2020   Plantar fasciitis 04/15/2020   Dysthymia 04/15/2020   Vitamin D deficiency 04/15/2020   Gastroesophageal reflux disease 04/15/2020   Insect bite of left foot 11/24/2019   Pustule 11/24/2019   Chronic pain of right thumb 03/09/2019   External hemorrhoids 03/03/2018   Other chest pain 02/23/2018   Insomnia 02/24/2017   Healthcare maintenance 02/24/2017   Conjunctivitis 03/01/2014   Seborrheic keratosis, inflamed 01/05/2012   Postmenopausal disorder 12/22/2011   Skin cancer 07/14/2011   Migraine 05/14/2007   IRRITABLE BOWEL SYNDROME 05/14/2007   History of mitral valve prolapse 05/14/2007   Allergic rhinitis 04/07/2007   DERMATITIS DUE TO SOLAR RADIATION 04/07/2007   Premature beat 09/20/2006   DIVERTICULOSIS, COLON 04/13/2002    Sumner Boast, PT 03/27/2021, 4:11 PM  [place]. [place], [place], [address] Phone: [phone]   Fax:  [phone]  Name: TRINIDY MASTERSON MRN: [id number] Date of Birth: 1950/11/17

## 2021-04-02 ENCOUNTER — Other Ambulatory Visit: Payer: Self-pay

## 2021-04-02 ENCOUNTER — Encounter: Payer: Self-pay | Admitting: Physical Therapy

## 2021-04-02 ENCOUNTER — Ambulatory Visit: Payer: Medicare PPO | Admitting: Physical Therapy

## 2021-04-02 DIAGNOSIS — R252 Cramp and spasm: Secondary | ICD-10-CM | POA: Diagnosis not present

## 2021-04-02 DIAGNOSIS — M25675 Stiffness of left foot, not elsewhere classified: Secondary | ICD-10-CM

## 2021-04-02 DIAGNOSIS — M79672 Pain in left foot: Secondary | ICD-10-CM

## 2021-04-02 DIAGNOSIS — Z85828 Personal history of other malignant neoplasm of skin: Secondary | ICD-10-CM | POA: Diagnosis not present

## 2021-04-02 DIAGNOSIS — L718 Other rosacea: Secondary | ICD-10-CM | POA: Diagnosis not present

## 2021-04-02 DIAGNOSIS — L821 Other seborrheic keratosis: Secondary | ICD-10-CM | POA: Diagnosis not present

## 2021-04-02 DIAGNOSIS — L720 Epidermal cyst: Secondary | ICD-10-CM | POA: Diagnosis not present

## 2021-04-02 DIAGNOSIS — M542 Cervicalgia: Secondary | ICD-10-CM | POA: Diagnosis not present

## 2021-04-02 DIAGNOSIS — R262 Difficulty in walking, not elsewhere classified: Secondary | ICD-10-CM | POA: Diagnosis not present

## 2021-04-02 DIAGNOSIS — Z419 Encounter for procedure for purposes other than remedying health state, unspecified: Secondary | ICD-10-CM | POA: Diagnosis not present

## 2021-04-02 NOTE — Therapy (Signed)
[place]. [place], [place], [address] Phone: [phone]   Fax:  [phone]  Physical Therapy Treatment  Patient Details  Name: [name] MRN: [id number] Date of Birth: 1950/10/09 Referring Provider (PT): Kremmer   Encounter Date: 04/02/2021   PT End of Session - 04/02/21 1520     Visit Number 16    Date for PT Re-Evaluation 05/16/21    Authorization Type [employer]    Authorization Time Period 5/12    PT Start Time 1438    PT Stop Time 1522    PT Time Calculation (min) 44 min    Activity Tolerance Patient tolerated treatment well    Behavior During Therapy [facility] for tasks assessed/performed             Past Medical History:  Diagnosis Date   Anal fissure    Anemia    during pregnancy   Anxiety    Basal cell carcinoma    DDD (degenerative disc disease)    Diverticulosis    Hypokalemia    LBP (low back pain)    Melanoma (HCC)    Migraine    MVP (mitral valve prolapse) 1990   psa   Rosacea    Ulcer    as a child    Past Surgical History:  Procedure Laterality Date   ABDOMINAL HYSTERECTOMY  1985   BREAST BIOPSY Left    secondary to infection   I & D EXTREMITY Right 09/09/2012   Procedure: Minor IRRIGATION AND DEBRIDEMENT EXTREMITY paronychia of right thumb;  Surgeon: Wynonia Sours, MD;  Location: [place];  Service: Orthopedics;  Laterality: Right;   I & D EXTREMITY Left 01/26/2014   Procedure: MINOR IINCISION AND DRAINAGE paronychia left index finger;  Surgeon: Daryll Brod, MD;  Location: [place];  Service: Orthopedics;  Laterality: Left;  left index   OOPHORECTOMY Left 1994   REPAIR KNEE LIGAMENT Left 1989   TONSILLECTOMY  1955   TUBAL LIGATION     VEIN SURGERY      There were no vitals filed for this visit.   Subjective Assessment - 04/02/21 1441     Subjective I was pretty good, I was sore when I left but no lasting soreness    Currently in Pain?  No/denies                               [facility] Adult PT Treatment/Exercise - 04/02/21 0001       Ankle Exercises: Stretches   Gastroc Stretch 4 reps;20 seconds    Other Stretch ITB, piriformis and HS stretches      Ankle Exercises: Aerobic   Nustep level 5 x 5 minutes      Ankle Exercises: Standing   Other Standing Ankle Exercises 5# straight arm pulls with cues for posture and core, rows, lats 15# 2x10, 10# biceps, 20# triceps. 5# hip abduction and extension, 5# leg extension, 15# HS curls      Ankle Exercises: Seated   Other Seated Ankle Exercises isometric abs      Ankle Exercises: Supine   T-Band red all ankle motions                       PT Short Term Goals - 07/10/20 1524       PT SHORT TERM GOAL #1   Title independent with initial  HEP    Status Achieved               PT Long Term Goals - 04/02/21 1615       PT LONG TERM GOAL #1   Title decrease pain 50%    Status Partially Met                   Plan - 04/02/21 1614     Clinical Impression Statement Patient reports that she was a little sore after the last visit but it eased up quickly, she reports that she feels good that she cand ot ht egym activities, we did this again today and did a little more things to work on the core and the hip strength    PT Next Visit Plan continue to work with her to help advance her function    Consulted and Agree with Plan of Care Patient             Patient will benefit from skilled therapeutic intervention in order to improve the following deficits and impairments:  Abnormal gait, Decreased range of motion, Difficulty walking, Increased fascial restricitons, Pain, Decreased balance, Impaired flexibility, Decreased strength  Visit Diagnosis: Pain in left foot  Difficulty in walking, not elsewhere classified  Cramp and spasm  Stiffness of left foot, not elsewhere classified  Cervicalgia     Problem List Patient  Active Problem List   Diagnosis Date Noted   Medication side effect 03/03/2021   Excessive cerumen in right ear canal 03/03/2021   Labyrinthitis 07/30/2020   Encounter for counseling 07/19/2020   Palpitations 04/15/2020   Plantar fasciitis 04/15/2020   Dysthymia 04/15/2020   Vitamin D deficiency 04/15/2020   Gastroesophageal reflux disease 04/15/2020   Insect bite of left foot 11/24/2019   Pustule 11/24/2019   Chronic pain of right thumb 03/09/2019   External hemorrhoids 03/03/2018   Other chest pain 02/23/2018   Insomnia 02/24/2017   Healthcare maintenance 02/24/2017   Conjunctivitis 03/01/2014   Seborrheic keratosis, inflamed 01/05/2012   Postmenopausal disorder 12/22/2011   Skin cancer 07/14/2011   Migraine 05/14/2007   IRRITABLE BOWEL SYNDROME 05/14/2007   History of mitral valve prolapse 05/14/2007   Allergic rhinitis 04/07/2007   DERMATITIS DUE TO SOLAR RADIATION 04/07/2007   Premature beat 09/20/2006   DIVERTICULOSIS, COLON 04/13/2002    Sumner Boast, PT 04/02/2021, 4:16 PM  [place]. [place], [place], [address] Phone: [phone]   Fax:  [phone]  Name: SENIYAH ESKER MRN: [id number] Date of Birth: 07/19/1950

## 2021-04-09 ENCOUNTER — Encounter: Payer: Self-pay | Admitting: Physical Therapy

## 2021-04-09 ENCOUNTER — Other Ambulatory Visit: Payer: Self-pay

## 2021-04-09 ENCOUNTER — Ambulatory Visit: Payer: Medicare PPO | Attending: Family Medicine | Admitting: Physical Therapy

## 2021-04-09 DIAGNOSIS — M25675 Stiffness of left foot, not elsewhere classified: Secondary | ICD-10-CM | POA: Insufficient documentation

## 2021-04-09 DIAGNOSIS — R262 Difficulty in walking, not elsewhere classified: Secondary | ICD-10-CM | POA: Diagnosis not present

## 2021-04-09 DIAGNOSIS — M542 Cervicalgia: Secondary | ICD-10-CM | POA: Diagnosis not present

## 2021-04-09 DIAGNOSIS — R252 Cramp and spasm: Secondary | ICD-10-CM | POA: Insufficient documentation

## 2021-04-09 DIAGNOSIS — M79672 Pain in left foot: Secondary | ICD-10-CM | POA: Diagnosis not present

## 2021-04-09 NOTE — Therapy (Signed)
[facility] ?[place], [place], [address] ?Phone: [phone]   Fax:  [phone] ? ?Physical Therapy Treatment ? ?Patient Details  ?Name: [name] ?MRN: [id number] ?Date of Birth: 19-Jun-1950 ?Referring Provider (PT): Kremmer ? ? ?Encounter Date: 04/09/2021 ? ? PT End of Session - 04/09/21 1350   ? ? Visit Number 17   ? Date for PT Re-Evaluation 05/16/21   ? Authorization Type [employer]   ? Authorization Time Period 6/12   ? PT Start Time 1308   ? PT Stop Time 8127   ? PT Time Calculation (min) 43 min   ? Activity Tolerance Patient tolerated treatment well   ? Behavior During Therapy [facility] for tasks assessed/performed   ? ?  ?  ? ?  ? ? ?Past Medical History:  ?Diagnosis Date  ? Anal fissure   ? Anemia   ? during pregnancy  ? Anxiety   ? Basal cell carcinoma   ? DDD (degenerative disc disease)   ? Diverticulosis   ? Hypokalemia   ? LBP (low back pain)   ? Melanoma ([place])   ? Migraine   ? MVP (mitral valve prolapse) 1990  ? psa  ? Rosacea   ? Ulcer   ? as a child  ? ? ?Past Surgical History:  ?Procedure Laterality Date  ? ABDOMINAL HYSTERECTOMY  1985  ? BREAST BIOPSY Left   ? secondary to infection  ? I & D EXTREMITY Right 09/09/2012  ? Procedure: Minor IRRIGATION AND DEBRIDEMENT EXTREMITY paronychia of right thumb;  Surgeon: Wynonia Sours, MD;  Location: [facility];  Service: Orthopedics;  Laterality: Right;  ? I & D EXTREMITY Left 01/26/2014  ? Procedure: MINOR IINCISION AND DRAINAGE paronychia left index finger;  Surgeon: Daryll Brod, MD;  Location: [facility];  Service: Orthopedics;  Laterality: Left;  left index  ? OOPHORECTOMY Left 1994  ? REPAIR KNEE LIGAMENT Left 1989  ? TONSILLECTOMY  1955  ? TUBAL LIGATION    ? VEIN SURGERY    ? ? ?There were no vitals filed for this visit. ? ? Subjective Assessment - 04/09/21 1308   ? ? Subjective Went to the beach, did some yardwork, did some walking for exercise   ? Currently in Pain?  No/denies   ? ?  ?  ? ?  ? ? ? ? ? ? ? ? ? ? ? ? ? ? ? ? ? ? ? ? [facility] Adult PT Treatment/Exercise - 04/09/21 0001   ? ?  ? Ambulation/Gait  ? Gait Comments gait around the back building up and down slopes, pain lateral heel with down slope with the heel strike   ?  ? High Level Balance  ? High Level Balance Comments on airex narrow BOS, head turns, up and down, eyes closed and the head turns, aorex balance beam side stepping and tandem walking   ?  ? Ankle Exercises: Aerobic  ? Recumbent Bike level 3 x 6 minutes   ? Nustep level 5 x 5 minutes   ?  ? Ankle Exercises: Standing  ? Other Standing Ankle Exercises 5# straight arm pulls with cues for posture and core, rows, lats 15# 2x10, 10# biceps, 20# triceps. 5# hip abduction and extension, 5# leg extension, 15# HS curls   ? Other Standing Ankle Exercises 10# farmer's carry, , 3# overhead carry   ?  ? Ankle Exercises: Stretches  ? Gastroc Stretch 4 reps;20  seconds   ? Other Stretch ITB, piriformis and HS stretches   ?  ? Ankle Exercises: Supine  ? T-Band red all ankle motions   ?  ? Ankle Exercises: Seated  ? Other Seated Ankle Exercises isometric abs   ? Other Seated Ankle Exercises quadraped hip exercises, isometric ab work for core   ? ?  ?  ? ?  ? ? ? ? ? ? ? ? ? ? ? ? PT Short Term Goals - 07/10/20 1524   ? ?  ? PT SHORT TERM GOAL #1  ? Title independent with initial HEP   ? Status Achieved   ? ?  ?  ? ?  ? ? ? ? PT Long Term Goals - 04/09/21 1352   ? ?  ? PT LONG TERM GOAL #1  ? Title decrease pain 50%   ? Status Partially Met   ? ?  ?  ? ?  ? ? ? ? ? ? ? ? Plan - 04/09/21 1351   ? ? Clinical Impression Statement Patient was able to go to the beach and do some yardwork and walk, had not issues with this, just a little sore, I am progressing total body fitness and stability, she is very tight in the calves and the HS   ? PT Next Visit Plan continue to work with her to help advance her function   ? Consulted and Agree with Plan of Care Patient   ? ?  ?  ? ?   ? ? ?Patient will benefit from skilled therapeutic intervention in order to improve the following deficits and impairments:  Abnormal gait, Decreased range of motion, Difficulty walking, Increased fascial restricitons, Pain, Decreased balance, Impaired flexibility, Decreased strength ? ?Visit Diagnosis: ?Pain in left foot ? ?Difficulty in walking, not elsewhere classified ? ?Cramp and spasm ? ?Stiffness of left foot, not elsewhere classified ? ?Cervicalgia ? ? ? ? ?Problem List ?Patient Active Problem List  ? Diagnosis Date Noted  ? Medication side effect 03/03/2021  ? Excessive cerumen in right ear canal 03/03/2021  ? Labyrinthitis 07/30/2020  ? Encounter for counseling 07/19/2020  ? Palpitations 04/15/2020  ? Plantar fasciitis 04/15/2020  ? Dysthymia 04/15/2020  ? Vitamin D deficiency 04/15/2020  ? Gastroesophageal reflux disease 04/15/2020  ? Insect bite of left foot 11/24/2019  ? Pustule 11/24/2019  ? Chronic pain of right thumb 03/09/2019  ? External hemorrhoids 03/03/2018  ? Other chest pain 02/23/2018  ? Insomnia 02/24/2017  ? Healthcare maintenance 02/24/2017  ? Conjunctivitis 03/01/2014  ? Seborrheic keratosis, inflamed 01/05/2012  ? Postmenopausal disorder 12/22/2011  ? Skin cancer 07/14/2011  ? Migraine 05/14/2007  ? IRRITABLE BOWEL SYNDROME 05/14/2007  ? History of mitral valve prolapse 05/14/2007  ? Allergic rhinitis 04/07/2007  ? DERMATITIS DUE TO SOLAR RADIATION 04/07/2007  ? Premature beat 09/20/2006  ? DIVERTICULOSIS, COLON 04/13/2002  ? ? Sumner Boast, PT ?04/09/2021, 1:52 PM ? ?[facility] ?[address] ?[place], [place], [address] ?Phone: [phone]   Fax:  [phone] ? ?Name: SHEVETTE BESS ?MRN: [id number] ?Date of Birth: 02-29-1950 ? ? ? ?

## 2021-04-16 ENCOUNTER — Ambulatory Visit: Payer: Medicare PPO | Admitting: Physical Therapy

## 2021-04-18 ENCOUNTER — Encounter: Payer: Medicare PPO | Admitting: Family Medicine

## 2021-04-23 ENCOUNTER — Other Ambulatory Visit: Payer: Self-pay

## 2021-04-23 ENCOUNTER — Encounter: Payer: Self-pay | Admitting: Physical Therapy

## 2021-04-23 ENCOUNTER — Ambulatory Visit: Payer: Medicare PPO | Admitting: Physical Therapy

## 2021-04-23 DIAGNOSIS — M25675 Stiffness of left foot, not elsewhere classified: Secondary | ICD-10-CM

## 2021-04-23 DIAGNOSIS — R252 Cramp and spasm: Secondary | ICD-10-CM

## 2021-04-23 DIAGNOSIS — R262 Difficulty in walking, not elsewhere classified: Secondary | ICD-10-CM

## 2021-04-23 DIAGNOSIS — M79672 Pain in left foot: Secondary | ICD-10-CM

## 2021-04-23 DIAGNOSIS — M542 Cervicalgia: Secondary | ICD-10-CM | POA: Diagnosis not present

## 2021-04-23 NOTE — Therapy (Addendum)
[facility] ?[place], [place], [address] ?Phone: [phone]   Fax:  [phone] ? ?Physical Therapy Treatment ? ?Patient Details  ?Name: [name] ?MRN: [id number] ?Date of Birth: August 22, 1950 ?Referring Provider (PT): Kremmer ? ? ?Encounter Date: 04/23/2021 ? ? PT End of Session - 04/23/21 1139   ? ? Visit Number 18   ? Date for PT Re-Evaluation 05/16/21   ? Authorization Type [employer]   ? Authorization Time Period 7/12   ? PT Start Time 1052   ? PT Stop Time 7412   ? PT Time Calculation (min) 46 min   ? Activity Tolerance Patient tolerated treatment well   ? Behavior During Therapy [facility] for tasks assessed/performed   ? ?  ?  ? ?  ? ? ?Past Medical History:  ?Diagnosis Date  ? Anal fissure   ? Anemia   ? during pregnancy  ? Anxiety   ? Basal cell carcinoma   ? DDD (degenerative disc disease)   ? Diverticulosis   ? Hypokalemia   ? LBP (low back pain)   ? Melanoma ([place])   ? Migraine   ? MVP (mitral valve prolapse) 1990  ? psa  ? Rosacea   ? Ulcer   ? as a child  ? ? ?Past Surgical History:  ?Procedure Laterality Date  ? ABDOMINAL HYSTERECTOMY  1985  ? BREAST BIOPSY Left   ? secondary to infection  ? I & D EXTREMITY Right 09/09/2012  ? Procedure: Minor IRRIGATION AND DEBRIDEMENT EXTREMITY paronychia of right thumb;  Surgeon: Wynonia Sours, MD;  Location: [facility];  Service: Orthopedics;  Laterality: Right;  ? I & D EXTREMITY Left 01/26/2014  ? Procedure: MINOR IINCISION AND DRAINAGE paronychia left index finger;  Surgeon: Daryll Brod, MD;  Location: [facility];  Service: Orthopedics;  Laterality: Left;  left index  ? OOPHORECTOMY Left 1994  ? REPAIR KNEE LIGAMENT Left 1989  ? TONSILLECTOMY  1955  ? TUBAL LIGATION    ? VEIN SURGERY    ? ? ?There were no vitals filed for this visit. ? ? Subjective Assessment - 04/23/21 1103   ? ? Subjective Patient reports a little more soreness and pain and stiffness, reports that this time of  the year she does a lot more without shoes on.   ? Currently in Pain? Yes   ? Pain Score 3    ? Pain Location Foot   ? Pain Orientation Right   ? Pain Descriptors / Indicators Sore   ? Aggravating Factors  not wearing shoes   ? ?  ?  ? ?  ? ? ? ? ? ? ? ? ? ? ? ? ? ? ? ? ? ? ? ? [facility] Adult PT Treatment/Exercise - 04/23/21 0001   ? ?  ? Ankle Exercises: Stretches  ? Gastroc Stretch 4 reps;20 seconds   ? Other Stretch ITB, piriformis and HS stretches   ?  ? Ankle Exercises: Machines for Strengthening  ? Cybex Leg Press leg extension 5# and leg curls 20# 2x10 each, 30# resisted gait all directions.  15# triceps, 5# biceps, 10# rows, 15# lats   ?  ? Ankle Exercises: Seated  ? Other Seated Ankle Exercises isometric abs   ?  ? Ankle Exercises: Supine  ? T-Band red all ankle motions   ? ?  ?  ? ?  ? ? ? ? ? ? ? ? ? ? ? ?  PT Short Term Goals - 07/10/20 1524   ? ?  ? PT SHORT TERM GOAL #1  ? Title independent with initial HEP   ? Status Achieved   ? ?  ?  ? ?  ? ? ? ? PT Long Term Goals - 04/23/21 1142   ? ?  ? PT LONG TERM GOAL #1  ? Title decrease pain 50%   ? Status Partially Met   ?  ? PT LONG TERM GOAL #2  ? Title increase DF to 10 degrees   ? Status Achieved   ?  ? PT LONG TERM GOAL #3  ? Title independent with RICE   ? Status Achieved   ?  ? PT LONG TERM GOAL #4  ? Title no difficulty walking   ? Status Partially Met   ? ?  ?  ? ?  ? ? ? ? ? ? ? ? Plan - 04/23/21 1140   ? ? Clinical Impression Statement Patient reports that she has been active but has not been wearing shoes while in the house, she reports that as the weather warms she does not wear shoes, often, she is c/o more pain/soreness, we talked about the need to wear shoes to help the foot mms.  I added more LE strength exercises, she is very weak in the hips and the thighs,   She has a big trip planned in mid April, would like to really work on her tolerance to activity   ? PT Next Visit Plan continue to work with her to help advance her function   ? Consulted  and Agree with Plan of Care Patient   ? ?  ?  ? ?  ?05/21/21:  Patient called last week, her dad passed away earlier and her husband has had some health issues, she will be going on a trip in the next few weeks with a lot of walking, she is reporting an increase of foot pain and would like to return to PT as she was doing very good.I will renew and see if we can get her feeling better and walking better. ? ?Patient will benefit from skilled therapeutic intervention in order to improve the following deficits and impairments:  Abnormal gait, Decreased range of motion, Difficulty walking, Increased fascial restricitons, Pain, Decreased balance, Impaired flexibility, Decreased strength ? ?Visit Diagnosis: ?Pain in left foot ? ?Difficulty in walking, not elsewhere classified ? ?Cramp and spasm ? ?Stiffness of left foot, not elsewhere classified ? ?Cervicalgia ? ? ? ? ?Problem List ?Patient Active Problem List  ? Diagnosis Date Noted  ? Medication side effect 03/03/2021  ? Excessive cerumen in right ear canal 03/03/2021  ? Labyrinthitis 07/30/2020  ? Encounter for counseling 07/19/2020  ? Palpitations 04/15/2020  ? Plantar fasciitis 04/15/2020  ? Dysthymia 04/15/2020  ? Vitamin D deficiency 04/15/2020  ? Gastroesophageal reflux disease 04/15/2020  ? Insect bite of left foot 11/24/2019  ? Pustule 11/24/2019  ? Chronic pain of right thumb 03/09/2019  ? External hemorrhoids 03/03/2018  ? Other chest pain 02/23/2018  ? Insomnia 02/24/2017  ? Healthcare maintenance 02/24/2017  ? Conjunctivitis 03/01/2014  ? Seborrheic keratosis, inflamed 01/05/2012  ? Postmenopausal disorder 12/22/2011  ? Skin cancer 07/14/2011  ? Migraine 05/14/2007  ? IRRITABLE BOWEL SYNDROME 05/14/2007  ? History of mitral valve prolapse 05/14/2007  ? Allergic rhinitis 04/07/2007  ? DERMATITIS DUE TO SOLAR RADIATION 04/07/2007  ? Premature beat 09/20/2006  ? DIVERTICULOSIS, COLON 04/13/2002  ? ? ?Sumner Boast,  PT ?04/23/2021, 11:43 AM ? ?[facility] ?[address] ?[place], [place], [address] ?Phone: [phone]   Fax:  [phone] ? ?Name: [name] ?MRN: [id number] ?Date of Birth: July 31, 1950 ? ? ? ?

## 2021-04-24 DIAGNOSIS — H0288A Meibomian gland dysfunction right eye, upper and lower eyelids: Secondary | ICD-10-CM | POA: Diagnosis not present

## 2021-04-24 DIAGNOSIS — H43813 Vitreous degeneration, bilateral: Secondary | ICD-10-CM | POA: Diagnosis not present

## 2021-04-24 DIAGNOSIS — H04123 Dry eye syndrome of bilateral lacrimal glands: Secondary | ICD-10-CM | POA: Diagnosis not present

## 2021-04-24 DIAGNOSIS — H2513 Age-related nuclear cataract, bilateral: Secondary | ICD-10-CM | POA: Diagnosis not present

## 2021-04-30 ENCOUNTER — Ambulatory Visit: Payer: Medicare PPO | Admitting: Physical Therapy

## 2021-05-06 ENCOUNTER — Ambulatory Visit (INDEPENDENT_AMBULATORY_CARE_PROVIDER_SITE_OTHER): Payer: Medicare PPO | Admitting: Family Medicine

## 2021-05-06 ENCOUNTER — Encounter: Payer: Self-pay | Admitting: Family Medicine

## 2021-05-06 VITALS — BP 120/70 | HR 66 | Temp 96.6°F | Ht 61.0 in | Wt 113.2 lb

## 2021-05-06 DIAGNOSIS — Z0000 Encounter for general adult medical examination without abnormal findings: Secondary | ICD-10-CM

## 2021-05-06 DIAGNOSIS — F4321 Adjustment disorder with depressed mood: Secondary | ICD-10-CM | POA: Diagnosis not present

## 2021-05-06 DIAGNOSIS — M542 Cervicalgia: Secondary | ICD-10-CM | POA: Diagnosis not present

## 2021-05-06 LAB — TSH: TSH: 0.98 u[IU]/mL (ref 0.35–5.50)

## 2021-05-06 LAB — CBC
HCT: 41.1 % (ref 36.0–46.0)
Hemoglobin: 13.7 g/dL (ref 12.0–15.0)
MCHC: 33.4 g/dL (ref 30.0–36.0)
MCV: 95.8 fl (ref 78.0–100.0)
Platelets: 302 10*3/uL (ref 150.0–400.0)
RBC: 4.29 Mil/uL (ref 3.87–5.11)
RDW: 12.7 % (ref 11.5–15.5)
WBC: 7.7 10*3/uL (ref 4.0–10.5)

## 2021-05-06 LAB — COMPREHENSIVE METABOLIC PANEL
ALT: 16 U/L (ref 0–35)
AST: 23 U/L (ref 0–37)
Albumin: 4.5 g/dL (ref 3.5–5.2)
Alkaline Phosphatase: 65 U/L (ref 39–117)
BUN: 16 mg/dL (ref 6–23)
CO2: 29 mEq/L (ref 19–32)
Calcium: 10.6 mg/dL — ABNORMAL HIGH (ref 8.4–10.5)
Chloride: 101 mEq/L (ref 96–112)
Creatinine, Ser: 0.72 mg/dL (ref 0.40–1.20)
GFR: 83.4 mL/min (ref >60.00)
Glucose, Bld: 100 mg/dL — ABNORMAL HIGH (ref 70–99)
Potassium: 3.8 mEq/L (ref 3.5–5.1)
Sodium: 137 mEq/L (ref 135–145)
Total Bilirubin: 0.5 mg/dL (ref 0.2–1.2)
Total Protein: 7.6 g/dL (ref 6.0–8.3)

## 2021-05-06 LAB — URINALYSIS, ROUTINE W REFLEX MICROSCOPIC
Bilirubin Urine: NEGATIVE
Hgb urine dipstick: NEGATIVE
Ketones, ur: NEGATIVE
Leukocytes,Ua: NEGATIVE
Nitrite: NEGATIVE
RBC / HPF: NONE SEEN (ref 0–?)
Specific Gravity, Urine: <=1.005 — AB (ref 1.000–1.030)
Total Protein, Urine: NEGATIVE
Urine Glucose: NEGATIVE
Urobilinogen, UA: 0.2 (ref 0.0–1.0)
pH: 7 (ref 5.0–8.0)

## 2021-05-06 LAB — LIPID PANEL
Cholesterol: 176 mg/dL (ref 0–200)
HDL: 76.4 mg/dL (ref >39.00)
LDL Cholesterol: 77 mg/dL (ref 0–99)
NonHDL: 99.16
Total CHOL/HDL Ratio: 2
Triglycerides: 110 mg/dL (ref 0.0–149.0)
VLDL: 22 mg/dL (ref 0.0–40.0)

## 2021-05-06 LAB — HEMOGLOBIN A1C: Hgb A1c MFr Bld: 5.9 % (ref 4.6–6.5)

## 2021-05-06 NOTE — Progress Notes (Signed)
? ?Established Patient Office Visit ? ?Subjective:  ?Patient ID: [name], female    DOB: [birth year]  Age: 71 y.o. MRN: [id number] ? ?CC:  ?Chief Complaint  ?Patient presents with  ? Annual Exam  ?  CPE, Pt. C/o knot in center of right hand x 1 month, wants to check her A1C and experiencing chest pains this morning. Want to continue her physical therapy.  ? ? ?HPI ?[name] presents for her yearly health check.  She is up-to-date.  Has regular dental and GYN care.  Mammogram and colonoscopy are up-to-date.  Most significantly for her father recently passed in his funeral is tomorrow.  Physical therapy has been helping her cervicalgia and she would like to continue.  Seen hand surgery for her do between's contracture right hand.  She has been anxious recently not chest pain.  There is no nausea or shortness of breath races. ? ?Past Medical History:  ?Diagnosis Date  ? Anal fissure   ? Anemia   ? during pregnancy  ? Anxiety   ? Basal cell carcinoma   ? DDD (degenerative disc disease)   ? Diverticulosis   ? Hypokalemia   ? LBP (low back pain)   ? Melanoma ([place])   ? Migraine   ? MVP (mitral valve prolapse) 1990  ? psa  ? Rosacea   ? Ulcer   ? as a child  ? ? ?Past Surgical History:  ?Procedure Laterality Date  ? ABDOMINAL HYSTERECTOMY  1985  ? BREAST BIOPSY Left   ? secondary to infection  ? I & D EXTREMITY Right 09/09/2012  ? Procedure: Minor IRRIGATION AND DEBRIDEMENT EXTREMITY paronychia of right thumb;  Surgeon: Wynonia Sours, MD;  Location: [facility];  Service: Orthopedics;  Laterality: Right;  ? I & D EXTREMITY Left 01/26/2014  ? Procedure: MINOR IINCISION AND DRAINAGE paronychia left index finger;  Surgeon: Daryll Brod, MD;  Location: [name];  Service: Orthopedics;  Laterality: Left;  left index  ? OOPHORECTOMY Left 1994  ? REPAIR KNEE LIGAMENT Left 1989  ? TONSILLECTOMY  1955  ? TUBAL LIGATION    ? VEIN SURGERY    ? ? ?Family History  ?Problem Relation Age of Onset  ?  Heart disease Mother   ? Colon polyps Mother   ? Heart disease Father   ? Colitis Father   ? Colon polyps Father   ? Esophageal cancer Maternal Aunt   ? Colon cancer Neg Hx   ? ? ?Social History  ? ?Socioeconomic History  ? Marital status: Married  ?  Spouse name: Not on file  ? Number of children: 2  ? Years of education: Not on file  ? Highest education level: Not on file  ?Occupational History  ? Occupation: retired school system  ?Tobacco Use  ? Smoking status: Never  ? Smokeless tobacco: Never  ?Vaping Use  ? Vaping Use: Never used  ?Substance and Sexual Activity  ? Alcohol use: Yes  ?  Alcohol/week: 1.0 standard drink  ?  Types: 1 Standard drinks or equivalent per week  ?  Comment: social wine  ? Drug use: No  ? Sexual activity: Yes  ?Other Topics Concern  ? Not on file  ?Social History Narrative  ? Not on file  ? ?Social Determinants of Health  ? ?Financial Resource Strain: Low Risk   ? Difficulty of Paying Living Expenses: Not hard at all  ?Food Insecurity: No Food Insecurity  ? Worried  About Running Out of Food in the Last Year: Never true  ? Ran Out of Food in the Last Year: Never true  ?Transportation Needs: No Transportation Needs  ? Lack of Transportation (Medical): No  ? Lack of Transportation (Non-Medical): No  ?Physical Activity: Insufficiently Active  ? Days of Exercise per Week: 3 days  ? Minutes of Exercise per Session: 30 min  ?Stress: No Stress Concern Present  ? Feeling of Stress : Not at all  ?Social Connections: Socially Integrated  ? Frequency of Communication with Friends and Family: More than three times a week  ? Frequency of Social Gatherings with Friends and Family: More than three times a week  ? Attends Religious Services: More than 4 times per year  ? Active Member of Clubs or Organizations: Yes  ? Attends Archivist Meetings: 1 to 4 times per year  ? Marital Status: Married  ?Intimate Partner Violence: Not At Risk  ? Fear of Current or Ex-Partner: No  ? Emotionally Abused:  No  ? Physically Abused: No  ? Sexually Abused: No  ? ? ?Outpatient Medications Prior to Visit  ?Medication Sig Dispense Refill  ? atenolol (TENORMIN) 25 MG tablet Take 0.5 tablets (12.5 mg total) by mouth daily as needed (for palpitations). 15 tablet 0  ? calcium-vitamin D 250-100 MG-UNIT per tablet Take 1 tablet by mouth daily.    ? docusate sodium (COLACE) 100 MG capsule Take 100 mg by mouth daily. Daily at bedtime    ? doxycycline (VIBRA-TABS) 100 MG tablet Take 1 tablet (100 mg total) by mouth 2 (two) times daily. (Patient taking differently: Take 50 mg by mouth 2 (two) times daily.) 20 tablet 0  ? estradiol (ESTRACE) 0.5 MG tablet Take 0.5 mg by mouth daily.    ? hydrocortisone (ANUSOL-HC) 2.5 % rectal cream Place 1 application rectally 2 (two) times daily. 30 g 0  ? meclizine (ANTIVERT) 12.5 MG tablet meclizine 12.5 mg tablet ? one tablet every 6-12 hours as needed for dizziness    ? meloxicam (MOBIC) 15 MG tablet Take 1 tablet (15 mg total) by mouth as needed. 30 tablet 1  ? Multiple Vitamin (MULTIVITAMIN) tablet Take 1 tablet by mouth daily.    ? NON FORMULARY Hydro eye    ? omeprazole (PRILOSEC) 20 MG capsule Take 1 capsule (20 mg total) by mouth daily. AS NEEDED 100 capsule 4  ? zolmitriptan (ZOMIG-ZMT) 2.5 MG disintegrating tablet TAKE ONE TABLET BY MOUTH AS NEEDED FOR MIGRAINE 10 tablet 4  ? cycloSPORINE, PF, (CEQUA) 0.09 % SOLN Cequa 0.09 % eye drops in a dropperette (Patient not taking: Reported on 05/06/2021)    ? ?No facility-administered medications prior to visit.  ? ? ?Allergies  ?Allergen Reactions  ? Codeine   ? Compazine   ? Gabapentin   ? Prednisone   ?  REACTION: makes heart beat hard' \\T'$ \ fast  ? Prochlorperazine Edisylate   ? Sulfa Antibiotics   ? Sulfonamide Derivatives   ? ? ?ROS ?Review of Systems  ?Constitutional:  Negative for diaphoresis, fatigue, fever and unexpected weight change.  ?HENT: Negative.    ?Eyes:  Negative for photophobia and visual disturbance.  ?Respiratory: Negative.     ?Cardiovascular: Negative.   ?Gastrointestinal: Negative.   ?Endocrine: Negative for polyphagia and polyuria.  ?Genitourinary: Negative.   ?Musculoskeletal:  Positive for neck pain and neck stiffness.  ?Neurological:  Negative for speech difficulty and weakness.  ?Psychiatric/Behavioral:  Positive for dysphoric mood. The patient is nervous/anxious.   ? ?  ? ?  05/06/2021  ?  9:23 AM 05/06/2021  ?  8:57 AM 10/08/2020  ? 12:59 PM  ?Depression screen PHQ 2/9  ?Decreased Interest 1 1 0  ?Down, Depressed, Hopeless 1 0 0  ?PHQ - 2 Score 2 1 0  ?Altered sleeping 1    ?Tired, decreased energy 0    ?Change in appetite 0    ?Feeling bad or failure about yourself  1    ?Trouble concentrating 1    ?Moving slowly or fidgety/restless 0    ?Suicidal thoughts 0    ?PHQ-9 Score 5    ? ? ? ?Objective:  ?  ?Physical Exam ?Vitals and nursing note reviewed.  ?Constitutional:   ?   General: She is not in acute distress. ?   Appearance: Normal appearance. She is normal weight. She is not ill-appearing, toxic-appearing or diaphoretic.  ?HENT:  ?   Head: Normocephalic and atraumatic.  ?   Right Ear: Tympanic membrane, ear canal and external ear normal.  ?   Left Ear: Tympanic membrane, ear canal and external ear normal.  ?   Mouth/Throat:  ?   Mouth: Mucous membranes are moist.  ?   Pharynx: Oropharynx is clear. No oropharyngeal exudate or posterior oropharyngeal erythema.  ?Eyes:  ?   General: No scleral icterus.    ?   Right eye: No discharge.     ?   Left eye: No discharge.  ?   Extraocular Movements: Extraocular movements intact.  ?   Conjunctiva/sclera: Conjunctivae normal.  ?   Pupils: Pupils are equal, round, and reactive to light.  ?Neck:  ?   Vascular: No carotid bruit.  ?Cardiovascular:  ?   Rate and Rhythm: Normal rate and regular rhythm.  ?Pulmonary:  ?   Effort: Pulmonary effort is normal.  ?   Breath sounds: Normal breath sounds.  ?Abdominal:  ?   General: Bowel sounds are normal.  ?Musculoskeletal:  ?   Cervical back: No  rigidity or tenderness.  ?   Right lower leg: No edema.  ?   Left lower leg: No edema.  ?Lymphadenopathy:  ?   Cervical: No cervical adenopathy.  ?Skin: ?   General: Skin is warm and dry.  ?Neurological:

## 2021-05-12 ENCOUNTER — Ambulatory Visit: Payer: Medicare PPO | Admitting: Physical Therapy

## 2021-05-12 DIAGNOSIS — Z1231 Encounter for screening mammogram for malignant neoplasm of breast: Secondary | ICD-10-CM | POA: Diagnosis not present

## 2021-05-12 DIAGNOSIS — F331 Major depressive disorder, recurrent, moderate: Secondary | ICD-10-CM | POA: Diagnosis not present

## 2021-05-12 DIAGNOSIS — Z01419 Encounter for gynecological examination (general) (routine) without abnormal findings: Secondary | ICD-10-CM | POA: Diagnosis not present

## 2021-05-12 LAB — HM MAMMOGRAPHY

## 2021-05-13 ENCOUNTER — Telehealth: Payer: Self-pay | Admitting: Family Medicine

## 2021-05-13 ENCOUNTER — Other Ambulatory Visit: Payer: Self-pay

## 2021-05-13 DIAGNOSIS — L089 Local infection of the skin and subcutaneous tissue, unspecified: Secondary | ICD-10-CM | POA: Insufficient documentation

## 2021-05-13 NOTE — Telephone Encounter (Signed)
Pt called requesting to speak with Lacie Scotts about her and her husband's lab results.  ? ?

## 2021-05-20 ENCOUNTER — Other Ambulatory Visit: Payer: Self-pay | Admitting: Family Medicine

## 2021-05-20 DIAGNOSIS — G43C0 Periodic headache syndromes in child or adult, not intractable: Secondary | ICD-10-CM

## 2021-05-22 NOTE — Telephone Encounter (Signed)
Returned patients call went over labs, no concerns.  ?

## 2021-06-04 ENCOUNTER — Ambulatory Visit: Payer: Medicare PPO | Attending: Family Medicine | Admitting: Physical Therapy

## 2021-06-04 ENCOUNTER — Encounter: Payer: Self-pay | Admitting: Physical Therapy

## 2021-06-04 DIAGNOSIS — M79672 Pain in left foot: Secondary | ICD-10-CM | POA: Diagnosis not present

## 2021-06-04 DIAGNOSIS — M25675 Stiffness of left foot, not elsewhere classified: Secondary | ICD-10-CM | POA: Diagnosis not present

## 2021-06-04 DIAGNOSIS — M542 Cervicalgia: Secondary | ICD-10-CM | POA: Insufficient documentation

## 2021-06-04 DIAGNOSIS — R262 Difficulty in walking, not elsewhere classified: Secondary | ICD-10-CM | POA: Insufficient documentation

## 2021-06-04 DIAGNOSIS — R252 Cramp and spasm: Secondary | ICD-10-CM | POA: Diagnosis not present

## 2021-06-04 NOTE — Therapy (Signed)
[facility] ?[address] ?[address] ?[place], [place], [address] ?Phone: [phone]   Fax:  [phone] ? ?Physical Therapy Treatment ? ?Patient Details  ?Name: [name] ?MRN: [id number] ?Date of Birth: 1950/06/18 ?Referring Provider (PT): Kremmer ? ? ?Encounter Date: 06/04/2021 ? ? PT End of Session - 06/04/21 1618   ? ? Visit Number 19   ? Date for PT Re-Evaluation 09/03/21   ? Authorization Type [employer]   ? Authorization Time Period 1/12   ? PT Start Time 1525   ? Activity Tolerance Patient tolerated treatment well   ? Behavior During Therapy [facility] for tasks assessed/performed   ? ?  ?  ? ?  ? ? ?Past Medical History:  ?Diagnosis Date  ? Anal fissure   ? Anemia   ? during pregnancy  ? Anxiety   ? Basal cell carcinoma   ? DDD (degenerative disc disease)   ? Diverticulosis   ? Hypokalemia   ? LBP (low back pain)   ? Melanoma ([facility])   ? Migraine   ? MVP (mitral valve prolapse) 1990  ? psa  ? Rosacea   ? Ulcer   ? as a child  ? ? ?Past Surgical History:  ?Procedure Laterality Date  ? ABDOMINAL HYSTERECTOMY  1985  ? BREAST BIOPSY Left   ? secondary to infection  ? I & D EXTREMITY Right 09/09/2012  ? Procedure: Minor IRRIGATION AND DEBRIDEMENT EXTREMITY paronychia of right thumb;  Surgeon: Wynonia Sours, MD;  Location: [facility];  Service: Orthopedics;  Laterality: Right;  ? I & D EXTREMITY Left 01/26/2014  ? Procedure: MINOR IINCISION AND DRAINAGE paronychia left index finger;  Surgeon: Daryll Brod, MD;  Location: [facility];  Service: Orthopedics;  Laterality: Left;  left index  ? OOPHORECTOMY Left 1994  ? REPAIR KNEE LIGAMENT Left 1989  ? TONSILLECTOMY  1955  ? TUBAL LIGATION    ? VEIN SURGERY    ? ? ?There were no vitals filed for this visit. ? ? Subjective Assessment - 06/04/21 1540   ? ? Subjective Patient has not been in to see [place] in about 6 weeks, she had her dad pass away and this was very tough on her, she did go on a trip and she reports  not doing very well, she reports very tight and stiff in the neck   ? Currently in Pain? Yes   ? Pain Score 8    ? Pain Location Neck   ? Pain Descriptors / Indicators Spasm;Tightness   ? Aggravating Factors  stress   ? ?  ?  ? ?  ? ? ? ? ? [facility] PT Assessment - 06/04/21 0001   ? ?  ? AROM  ? Overall AROM Comments cervical ROM very limited 50% due to tightness and pain in the upper traps and the neck   ? Left Ankle Dorsiflexion 12   ? Left Ankle Plantar Flexion 42   ? Left Ankle Inversion 25   ? Left Ankle Eversion 15   ? ?  ?  ? ?  ? ? ? ? ? ? ? ? ? ? ? ? ? ? ? ? [facility] Adult PT Treatment/Exercise - 06/04/21 0001   ? ?  ? Manual Therapy  ? Soft tissue mobilization to the upper traps and neck   ?  ? Ankle Exercises: Aerobic  ? Nustep level 5 x 5 minutes   ? Other Aerobic UBE level  2 x 4 minutes   ?  ? Ankle Exercises: Standing  ? Other Standing Ankle Exercises 5# straight arm pulls with cues for posture and core, rows, lats 15# 2x10, 10# biceps, 20# triceps. 5# hip abduction and extension, 5# leg extension, 15# HS curls   ? ?  ?  ? ?  ? ? ? ? ? ? ? ? ? ? ? ? PT Short Term Goals - 07/10/20 1524   ? ?  ? PT SHORT TERM GOAL #1  ? Title independent with initial HEP   ? Status Achieved   ? ?  ?  ? ?  ? ? ? ? PT Long Term Goals - 06/04/21 1646   ? ?  ? PT LONG TERM GOAL #1  ? Title decrease pain 50%   ? Status Partially Met   ?  ? PT LONG TERM GOAL #2  ? Title increase DF to 10 degrees   ? Status Achieved   ?  ? PT LONG TERM GOAL #3  ? Title independent with RICE   ? Status Achieved   ?  ? PT LONG TERM GOAL #4  ? Title no difficulty walking   ? Status Achieved   ?  ? PT LONG TERM GOAL #5  ? Title incresae cervical ROM 25%   ? Time 12   ? Period Weeks   ? Status New   ? ?  ?  ? ?  ? ? ? ? ? ? ? ? Plan - 06/04/21 1641   ? ? Clinical Impression Statement Patient has been absent over the past 6 weeks, due to her father passing away, she then had a trip planned.  She comes in reporting that the foot is feeling pretty good even  with walking a lot. She reports stress and really hurting in the neck and the upper traps, she is extrenely tight and has limited shoulder ROM   ? PT Next Visit Plan work with her to get past this pain in the necka nd restore her function and mobility   ? Consulted and Agree with Plan of Care Patient   ? ?  ?  ? ?  ? ? ?Patient will benefit from skilled therapeutic intervention in order to improve the following deficits and impairments:  Abnormal gait, Decreased range of motion, Difficulty walking, Increased fascial restricitons, Pain, Decreased balance, Impaired flexibility, Decreased strength ? ?Visit Diagnosis: ?Pain in left foot - Plan: PT plan of care cert/re-cert ? ?Difficulty in walking, not elsewhere classified - Plan: PT plan of care cert/re-cert ? ?Cramp and spasm - Plan: PT plan of care cert/re-cert ? ?Stiffness of left foot, not elsewhere classified - Plan: PT plan of care cert/re-cert ? ?Cervicalgia - Plan: PT plan of care cert/re-cert ? ? ? ? ?Problem List ?Patient Active Problem List  ? Diagnosis Date Noted  ? Infection of skin 05/13/2021  ? Cervicalgia 05/06/2021  ? Grieving 05/06/2021  ? Conductive hearing loss of right ear 03/04/2021  ? Impacted cerumen of right ear 03/04/2021  ? Medication side effect 03/03/2021  ? Excessive cerumen in right ear canal 03/03/2021  ? Labyrinthitis 07/30/2020  ? Palpitations 04/15/2020  ? Plantar fasciitis 04/15/2020  ? Dysthymia 04/15/2020  ? Vitamin D deficiency 04/15/2020  ? Gastroesophageal reflux disease 04/15/2020  ? Insect bite of left foot 11/24/2019  ? Pustule 11/24/2019  ? Right hand pain 03/09/2019  ? External hemorrhoids 03/03/2018  ? Other chest pain 02/23/2018  ? Insomnia 02/24/2017  ?  Healthcare maintenance 02/24/2017  ? Conjunctivitis 03/01/2014  ? Seborrheic keratosis, inflamed 01/05/2012  ? Menopausal syndrome 12/22/2011  ? Encounter for counseling 11/11/2011  ? Skin cancer 07/14/2011  ? Basal cell carcinoma of face 03/10/2011  ? Migraine 05/14/2007   ? IRRITABLE BOWEL SYNDROME 05/14/2007  ? History of mitral valve prolapse 05/14/2007  ? Allergic rhinitis 04/07/2007  ? DERMATITIS DUE TO SOLAR RADIATION 04/07/2007  ? Premature beat 09/20/2006  ? DIVERTICULOSIS, COLON 04/13/2002  ? ? Sumner Boast, PT ?06/04/2021, 4:48 PM ? ?[facility] ?[address] ?[address] ?[place], [place], [address] ?Phone: [phone]   Fax:  [phone] ? ?Name: [name] ?MRN: [id number] ?Date of Birth: 04-Aug-1950 ? ? ? ?

## 2021-06-05 DIAGNOSIS — F331 Major depressive disorder, recurrent, moderate: Secondary | ICD-10-CM | POA: Diagnosis not present

## 2021-06-24 ENCOUNTER — Ambulatory Visit: Payer: Medicare PPO | Admitting: Physical Therapy

## 2021-07-01 ENCOUNTER — Encounter: Payer: Self-pay | Admitting: Physical Therapy

## 2021-07-01 ENCOUNTER — Ambulatory Visit: Payer: Medicare PPO | Attending: Family Medicine | Admitting: Physical Therapy

## 2021-07-01 DIAGNOSIS — R262 Difficulty in walking, not elsewhere classified: Secondary | ICD-10-CM | POA: Diagnosis not present

## 2021-07-01 DIAGNOSIS — M25675 Stiffness of left foot, not elsewhere classified: Secondary | ICD-10-CM | POA: Diagnosis not present

## 2021-07-01 DIAGNOSIS — M79672 Pain in left foot: Secondary | ICD-10-CM | POA: Diagnosis not present

## 2021-07-01 DIAGNOSIS — R252 Cramp and spasm: Secondary | ICD-10-CM | POA: Insufficient documentation

## 2021-07-01 DIAGNOSIS — M542 Cervicalgia: Secondary | ICD-10-CM | POA: Diagnosis not present

## 2021-07-01 NOTE — Therapy (Signed)
[place]. [place], [place], [address] Phone: [phone]   Fax:  [phone]  Physical Therapy Treatment  Patient Details  Name: [name] MRN: [id number] Date of Birth: 05-16-1950 Referring Provider (PT): Kremmer   Encounter Date: 07/01/2021   PT End of Session - 07/01/21 1619     Visit Number 20    Date for PT Re-Evaluation 09/03/21    Authorization Type [employer]    Authorization Time Period 3/12    PT Start Time 1610    PT Stop Time 1700    PT Time Calculation (min) 50 min    Activity Tolerance Patient tolerated treatment well             Past Medical History:  Diagnosis Date   Anal fissure    Anemia    during pregnancy   Anxiety    Basal cell carcinoma    DDD (degenerative disc disease)    Diverticulosis    Hypokalemia    LBP (low back pain)    Melanoma ([facility])    Migraine    MVP (mitral valve prolapse) 1990   psa   Rosacea    Ulcer    as a child    Past Surgical History:  Procedure Laterality Date   ABDOMINAL HYSTERECTOMY  1985   BREAST BIOPSY Left    secondary to infection   I & D EXTREMITY Right 09/09/2012   Procedure: Minor IRRIGATION AND DEBRIDEMENT EXTREMITY paronychia of right thumb;  Surgeon: Wynonia Sours, MD;  Location: [facility];  Service: Orthopedics;  Laterality: Right;   I & D EXTREMITY Left 01/26/2014   Procedure: MINOR IINCISION AND DRAINAGE paronychia left index finger;  Surgeon: Daryll Brod, MD;  Location: [facility];  Service: Orthopedics;  Laterality: Left;  left index   OOPHORECTOMY Left 1994   REPAIR KNEE LIGAMENT Left 1989   TONSILLECTOMY  1955   TUBAL LIGATION     VEIN SURGERY      There were no vitals filed for this visit.   Subjective Assessment - 07/01/21 1620     Subjective I have been doing okay, I have been busy cleaning daddy's house and yard, my hands are really hurting, a little back pain, but the feet are okay     Currently in Pain? Yes    Pain Score 3     Pain Location Neck    Pain Orientation Right    Pain Descriptors / Indicators Spasm    Aggravating Factors  working                               [facility] Adult PT Treatment/Exercise - 07/01/21 0001       Ambulation/Gait   Gait Comments gait around the back building up and down slopes, pain lateral foot with uneven terrain      Ankle Exercises: Aerobic   Nustep level 5 x 5 minutes      Ankle Exercises: Standing   Other Standing Ankle Exercises 5# straight arm pulls with cues for posture and core, rows, lats 15# 2x10, 10# biceps, 20# triceps. 5# hip abduction and extension, 5# leg extension, 15# HS curls    Other Standing Ankle Exercises 10# farmer's carry, , 3# overhead carry                       PT Short Term  Goals - 07/10/20 1524       PT SHORT TERM GOAL #1   Title independent with initial HEP    Status Achieved               PT Long Term Goals - 07/01/21 1621       PT LONG TERM GOAL #1   Title decrease pain 50%    Status Partially Met      PT LONG TERM GOAL #2   Title increase DF to 10 degrees    Status Achieved      PT LONG TERM GOAL #3   Title independent with RICE    Status Achieved      PT LONG TERM GOAL #4   Title no difficulty walking    Status Achieved                   Plan - 07/01/21 1641     Clinical Impression Statement Pateitn has been cleaning her father's house and yard, she has been very active, overall she is doing well, has some hand pain, some foot pain and some hip pain, biggest issue I see is she is very tight in the LE's  I added some strethes for her to do at home.  She will be back and forth to the beach this summer, she did have a tender area in the left 4th and 5th rays dorsally    PT Next Visit Plan gave some different HEP for LE stretches, she is very tight    PT Home Exercise Plan Access Code: [email or web address]  URL:  [email or web address]  Date: 07/01/2021  Prepared by: Lum Babe    Exercises  - Seated Piriformis Stretch  - 1 x daily - 7 x weekly - 2 sets - 10 reps - 30 hold  - Seated Hamstring Stretch with Chair  - 1 x daily - 7 x weekly - 2 sets - 10 reps - 30 hold    Consulted and Agree with Plan of Care Patient             Patient will benefit from skilled therapeutic intervention in order to improve the following deficits and impairments:  Abnormal gait, Decreased range of motion, Difficulty walking, Increased fascial restricitons, Pain, Decreased balance, Impaired flexibility, Decreased strength  Visit Diagnosis: Pain in left foot  Difficulty in walking, not elsewhere classified  Cramp and spasm  Stiffness of left foot, not elsewhere classified  Cervicalgia     Problem List Patient Active Problem List   Diagnosis Date Noted   Infection of skin 05/13/2021   Cervicalgia 05/06/2021   Grieving 05/06/2021   Conductive hearing loss of right ear 03/04/2021   Impacted cerumen of right ear 03/04/2021   Medication side effect 03/03/2021   Excessive cerumen in right ear canal 03/03/2021   Labyrinthitis 07/30/2020   Palpitations 04/15/2020   Plantar fasciitis 04/15/2020   Dysthymia 04/15/2020   Vitamin D deficiency 04/15/2020   Gastroesophageal reflux disease 04/15/2020   Insect bite of left foot 11/24/2019   Pustule 11/24/2019   Right hand pain 03/09/2019   External hemorrhoids 03/03/2018   Other chest pain 02/23/2018   Insomnia 02/24/2017   Healthcare maintenance 02/24/2017   Conjunctivitis 03/01/2014   Seborrheic keratosis, inflamed 01/05/2012   Menopausal syndrome 12/22/2011   Encounter for counseling 11/11/2011   Skin cancer 07/14/2011   Basal cell carcinoma of face 03/10/2011   Migraine 05/14/2007   IRRITABLE BOWEL SYNDROME 05/14/2007   History of mitral valve  prolapse 05/14/2007   Allergic rhinitis 04/07/2007   DERMATITIS DUE TO SOLAR RADIATION  04/07/2007   Premature beat 09/20/2006   DIVERTICULOSIS, COLON 04/13/2002    Sumner Boast, PT 07/01/2021, 4:48 PM  [place]. [place], [place], [address] Phone: [phone]   Fax:  [phone]  Name: [name] MRN: [id number] Date of Birth: 04-Jul-1950

## 2021-07-06 ENCOUNTER — Other Ambulatory Visit: Payer: Self-pay | Admitting: Family Medicine

## 2021-07-06 DIAGNOSIS — M722 Plantar fascial fibromatosis: Secondary | ICD-10-CM

## 2021-07-10 ENCOUNTER — Ambulatory Visit: Payer: Medicare PPO | Admitting: Physical Therapy

## 2021-07-17 ENCOUNTER — Ambulatory Visit: Payer: Medicare PPO | Admitting: Physical Therapy

## 2021-08-04 DIAGNOSIS — F331 Major depressive disorder, recurrent, moderate: Secondary | ICD-10-CM | POA: Diagnosis not present

## 2021-08-06 ENCOUNTER — Encounter: Payer: Self-pay | Admitting: Family Medicine

## 2021-08-07 ENCOUNTER — Ambulatory Visit: Payer: Medicare PPO | Attending: Family Medicine | Admitting: Physical Therapy

## 2021-08-07 ENCOUNTER — Encounter: Payer: Self-pay | Admitting: Physical Therapy

## 2021-08-07 DIAGNOSIS — M79672 Pain in left foot: Secondary | ICD-10-CM | POA: Insufficient documentation

## 2021-08-07 DIAGNOSIS — R252 Cramp and spasm: Secondary | ICD-10-CM | POA: Diagnosis not present

## 2021-08-07 DIAGNOSIS — R262 Difficulty in walking, not elsewhere classified: Secondary | ICD-10-CM | POA: Diagnosis not present

## 2021-08-07 DIAGNOSIS — M25675 Stiffness of left foot, not elsewhere classified: Secondary | ICD-10-CM | POA: Diagnosis not present

## 2021-08-07 DIAGNOSIS — M542 Cervicalgia: Secondary | ICD-10-CM | POA: Diagnosis not present

## 2021-08-07 NOTE — Therapy (Signed)
[facility]. [place], [place], [address] Phone: [phone]   Fax:  [phone]  Physical Therapy Treatment  Patient Details  Name: [name] MRN: [id number] Date of Birth: 1950/09/19 Referring Provider (PT): Kremmer   Encounter Date: 08/07/2021   PT End of Session - 08/07/21 1538     Visit Number 21    Date for PT Re-Evaluation 09/03/21    Authorization Type [employer]    Authorization Time Period 4/12    Activity Tolerance Patient tolerated treatment well    Behavior During Therapy [facility] for tasks assessed/performed             Past Medical History:  Diagnosis Date   Anal fissure    Anemia    during pregnancy   Anxiety    Basal cell carcinoma    DDD (degenerative disc disease)    Diverticulosis    Hypokalemia    LBP (low back pain)    Melanoma ([facility])    Migraine    MVP (mitral valve prolapse) 1990   psa   Rosacea    Ulcer    as a child    Past Surgical History:  Procedure Laterality Date   ABDOMINAL HYSTERECTOMY  1985   BREAST BIOPSY Left    secondary to infection   I & D EXTREMITY Right 09/09/2012   Procedure: Minor IRRIGATION AND DEBRIDEMENT EXTREMITY paronychia of right thumb;  Surgeon: Wynonia Sours, MD;  Location: [facility];  Service: Orthopedics;  Laterality: Right;   I & D EXTREMITY Left 01/26/2014   Procedure: MINOR IINCISION AND DRAINAGE paronychia left index finger;  Surgeon: Daryll Brod, MD;  Location: [facility];  Service: Orthopedics;  Laterality: Left;  left index   OOPHORECTOMY Left 1994   REPAIR KNEE LIGAMENT Left 1989   TONSILLECTOMY  1955   TUBAL LIGATION     VEIN SURGERY      There were no vitals filed for this visit.   Subjective Assessment - 08/07/21 1539     Subjective Patient reports that she has been doing okay, reports that she ha been to the beach, having more pain in the left top of the foot especially after sitting long periods     Currently in Pain? Yes    Pain Score 3     Pain Location Foot    Pain Orientation Left    Pain Descriptors / Indicators Aching                               [facility] Adult PT Treatment/Exercise - 08/07/21 0001       Manual Therapy   Joint Mobilization Mid foot joint mobs stiff mid foot    Soft tissue mobilization to the plantar and dorsal foot      Ankle Exercises: Aerobic   Nustep level 5 x 5 minutes      Ankle Exercises: Machines for Strengthening   Cybex Leg Press leg extension 5# and leg curls 20# 2x10 each, 30# resisted gait all directions.  15# triceps, 5# biceps, 10# rows, 15# lats      Ankle Exercises: Stretches   Gastroc Stretch 4 reps;20 seconds      Ankle Exercises: Standing   Other Standing Ankle Exercises 5# straight arm pulls                       PT  Short Term Goals - 07/10/20 1524       PT SHORT TERM GOAL #1   Title independent with initial HEP    Status Achieved               PT Long Term Goals - 08/07/21 1608       PT LONG TERM GOAL #1   Title decrease pain 50%    Status Partially Met                   Plan - 08/07/21 1608     Clinical Impression Statement Patient has been in and out of town, doing a lot to settle her dad's estate, still some emotion when talking about this.  She was doing well but reports that ovetr the past 2 weeks she has been at the beach more and is having more left foot pain, she has good mobility but is sore and tight    PT Next Visit Plan I had requested visits spread out over a longer period of time to allow her trips and for [place] to help as things arise    Consulted and Agree with Plan of Care Patient             Patient will benefit from skilled therapeutic intervention in order to improve the following deficits and impairments:  Abnormal gait, Decreased range of motion, Difficulty walking, Increased fascial restricitons, Pain, Decreased balance, Impaired flexibility,  Decreased strength  Visit Diagnosis: Pain in left foot  Difficulty in walking, not elsewhere classified  Cramp and spasm  Stiffness of left foot, not elsewhere classified  Cervicalgia     Problem List Patient Active Problem List   Diagnosis Date Noted   Infection of skin 05/13/2021   Cervicalgia 05/06/2021   Grieving 05/06/2021   Conductive hearing loss of right ear 03/04/2021   Impacted cerumen of right ear 03/04/2021   Medication side effect 03/03/2021   Excessive cerumen in right ear canal 03/03/2021   Labyrinthitis 07/30/2020   Palpitations 04/15/2020   Plantar fasciitis 04/15/2020   Dysthymia 04/15/2020   Vitamin D deficiency 04/15/2020   Gastroesophageal reflux disease 04/15/2020   Insect bite of left foot 11/24/2019   Pustule 11/24/2019   Right hand pain 03/09/2019   External hemorrhoids 03/03/2018   Other chest pain 02/23/2018   Insomnia 02/24/2017   Healthcare maintenance 02/24/2017   Conjunctivitis 03/01/2014   Seborrheic keratosis, inflamed 01/05/2012   Menopausal syndrome 12/22/2011   Encounter for counseling 11/11/2011   Skin cancer 07/14/2011   Basal cell carcinoma of face 03/10/2011   Migraine 05/14/2007   IRRITABLE BOWEL SYNDROME 05/14/2007   History of mitral valve prolapse 05/14/2007   Allergic rhinitis 04/07/2007   DERMATITIS DUE TO SOLAR RADIATION 04/07/2007   Premature beat 09/20/2006   DIVERTICULOSIS, COLON 04/13/2002    Sumner Boast, PT 08/07/2021, 4:10 PM  [place]. [place], [place], [address] Phone: [phone]   Fax:  [phone]  Name: MARIJANE TROWER MRN: [id number] Date of Birth: 1950/06/22

## 2021-09-03 ENCOUNTER — Encounter: Payer: Self-pay | Admitting: Physical Therapy

## 2021-09-03 ENCOUNTER — Ambulatory Visit: Payer: Medicare PPO | Attending: Family Medicine | Admitting: Physical Therapy

## 2021-09-03 DIAGNOSIS — R262 Difficulty in walking, not elsewhere classified: Secondary | ICD-10-CM | POA: Diagnosis not present

## 2021-09-03 DIAGNOSIS — R252 Cramp and spasm: Secondary | ICD-10-CM | POA: Insufficient documentation

## 2021-09-03 DIAGNOSIS — M25675 Stiffness of left foot, not elsewhere classified: Secondary | ICD-10-CM | POA: Diagnosis not present

## 2021-09-03 DIAGNOSIS — M542 Cervicalgia: Secondary | ICD-10-CM | POA: Insufficient documentation

## 2021-09-03 DIAGNOSIS — M79672 Pain in left foot: Secondary | ICD-10-CM | POA: Insufficient documentation

## 2021-09-03 NOTE — Therapy (Signed)
[facility]. [place], [place], [address] Phone: [phone]   Fax:  [phone]  Physical Therapy Treatment  Patient Details  Name: [name] MRN: [id number] Date of Birth: November 25, 1950 Referring Provider (PT): Kremmer   Encounter Date: 09/03/2021   PT End of Session - 09/03/21 1401     Visit Number 22    Authorization Type [employer]    Authorization Time Period 5/12    PT Start Time 0630    PT Stop Time 1601    PT Time Calculation (min) 44 min    Activity Tolerance Patient tolerated treatment well    Behavior During Therapy [facility] for tasks assessed/performed             Past Medical History:  Diagnosis Date   Anal fissure    Anemia    during pregnancy   Anxiety    Basal cell carcinoma    DDD (degenerative disc disease)    Diverticulosis    Hypokalemia    LBP (low back pain)    Melanoma ([facility])    Migraine    MVP (mitral valve prolapse) 1990   psa   Rosacea    Ulcer    as a child    Past Surgical History:  Procedure Laterality Date   ABDOMINAL HYSTERECTOMY  1985   BREAST BIOPSY Left    secondary to infection   I & D EXTREMITY Right 09/09/2012   Procedure: Minor IRRIGATION AND DEBRIDEMENT EXTREMITY paronychia of right thumb;  Surgeon: Wynonia Sours, MD;  Location: [facility];  Service: Orthopedics;  Laterality: Right;   I & D EXTREMITY Left 01/26/2014   Procedure: MINOR IINCISION AND DRAINAGE paronychia left index finger;  Surgeon: Daryll Brod, MD;  Location: [facility];  Service: Orthopedics;  Laterality: Left;  left index   OOPHORECTOMY Left 1994   REPAIR KNEE LIGAMENT Left 1989   TONSILLECTOMY  1955   TUBAL LIGATION     VEIN SURGERY      There were no vitals filed for this visit.   Subjective Assessment - 09/03/21 1402     Subjective My foot is hurting some by I think I am doing better overall , I can do more with less difficulty    Currently in Pain? Yes     Pain Score 3     Pain Location Foot    Pain Orientation Left    Pain Descriptors / Indicators Sore;Aching    Aggravating Factors  walking                               [facility] Adult PT Treatment/Exercise - 09/03/21 0001       Manual Therapy   Joint Mobilization Mid foot joint mobs stiff mid foot    Soft tissue mobilization to the plantar and dorsal foot                     PT Education - 09/03/21 1427     Education Details reviewed self care stretches, exercises, massage, shoes and stretches    Person(s) Educated Patient    Methods Explanation;Demonstration    Comprehension Verbalized understanding;Returned demonstration              PT Short Term Goals - 07/10/20 1524       PT SHORT TERM GOAL #1   Title independent with initial HEP  Status Achieved               PT Long Term Goals - 09/03/21 1426       PT LONG TERM GOAL #1   Title decrease pain 50%    Status Partially Met      PT LONG TERM GOAL #2   Title increase DF to 10 degrees    Status Achieved      PT LONG TERM GOAL #3   Title independent with RICE    Status Achieved      PT LONG TERM GOAL #4   Title no difficulty walking    Status Achieved      PT LONG TERM GOAL #5   Title incresae cervical ROM 25%    Status Achieved                   Plan - 09/03/21 1428     Clinical Impression Statement Patient overall doing well, reports able to do more and walk more, she is having some foot pain, but we went over self care and she feels tha tshe can do on her own.    PT Next Visit Plan d/c with most goals met    Consulted and Agree with Plan of Care Patient             Patient will benefit from skilled therapeutic intervention in order to improve the following deficits and impairments:  Abnormal gait, Decreased range of motion, Difficulty walking, Increased fascial restricitons, Pain, Decreased balance, Impaired flexibility, Decreased strength  Visit  Diagnosis: Pain in left foot  Difficulty in walking, not elsewhere classified  Cramp and spasm  Stiffness of left foot, not elsewhere classified  Cervicalgia     Problem List Patient Active Problem List   Diagnosis Date Noted   Infection of skin 05/13/2021   Cervicalgia 05/06/2021   Grieving 05/06/2021   Conductive hearing loss of right ear 03/04/2021   Impacted cerumen of right ear 03/04/2021   Medication side effect 03/03/2021   Excessive cerumen in right ear canal 03/03/2021   Labyrinthitis 07/30/2020   Palpitations 04/15/2020   Plantar fasciitis 04/15/2020   Dysthymia 04/15/2020   Vitamin D deficiency 04/15/2020   Gastroesophageal reflux disease 04/15/2020   Insect bite of left foot 11/24/2019   Pustule 11/24/2019   Right hand pain 03/09/2019   External hemorrhoids 03/03/2018   Other chest pain 02/23/2018   Insomnia 02/24/2017   Healthcare maintenance 02/24/2017   Conjunctivitis 03/01/2014   Seborrheic keratosis, inflamed 01/05/2012   Menopausal syndrome 12/22/2011   Encounter for counseling 11/11/2011   Skin cancer 07/14/2011   Basal cell carcinoma of face 03/10/2011   Migraine 05/14/2007   IRRITABLE BOWEL SYNDROME 05/14/2007   History of mitral valve prolapse 05/14/2007   Allergic rhinitis 04/07/2007   DERMATITIS DUE TO SOLAR RADIATION 04/07/2007   Premature beat 09/20/2006   DIVERTICULOSIS, COLON 04/13/2002    Sumner Boast, PT 09/03/2021, 2:29 PM  [place]. [place], [place], [address] Phone: [phone]   Fax:  [phone]  Name: [name] MRN: [id number] Date of Birth: 07-23-1950

## 2021-09-08 ENCOUNTER — Encounter: Payer: Self-pay | Admitting: Family Medicine

## 2021-09-08 ENCOUNTER — Ambulatory Visit: Payer: Medicare PPO | Admitting: Family Medicine

## 2021-09-08 VITALS — BP 118/68 | HR 68 | Temp 97.7°F | Ht 61.0 in | Wt 112.6 lb

## 2021-09-08 DIAGNOSIS — G43C0 Periodic headache syndromes in child or adult, not intractable: Secondary | ICD-10-CM | POA: Diagnosis not present

## 2021-09-08 DIAGNOSIS — L03011 Cellulitis of right finger: Secondary | ICD-10-CM

## 2021-09-08 DIAGNOSIS — K644 Residual hemorrhoidal skin tags: Secondary | ICD-10-CM

## 2021-09-08 MED ORDER — DOXYCYCLINE HYCLATE 100 MG PO TABS: 100.0000 mg | ORAL_TABLET | Freq: Two times a day (BID) | ORAL | 0 refills | Status: AC

## 2021-09-08 MED ORDER — HYDROCORTISONE (PERIANAL) 2.5 % EX CREA: 1.0000 | TOPICAL_CREAM | Freq: Two times a day (BID) | CUTANEOUS | 0 refills | Status: DC

## 2021-09-08 MED ORDER — ZOLMITRIPTAN 2.5 MG PO TBDP: ORAL_TABLET | ORAL | 4 refills | Status: DC

## 2021-09-08 NOTE — Progress Notes (Signed)
Established Patient Office Visit  Subjective   Patient ID: [name], female    DOB: 1950/05/09  Age: 71 y.o. MRN: [id number]  Chief Complaint  Patient presents with   Insect Bite    Bug sting (unsure what insect) on R index finger 2X days, Swollen, itchy,     HPI has developed burning and stinging with a progressive erythematous rash on right index finger after being bitten by a flying insect 3 days ago.  Has used Anusol HC as needed for hemorrhoids with good effect and would like a refill.  Zomig has worked well for her migraine headaches and she needs a refill.  Continues to work with a therapist as she continues to grieve her father's death.    Review of Systems  Constitutional:  Negative for chills, diaphoresis, malaise/fatigue and weight loss.  HENT: Negative.    Eyes: Negative.  Negative for blurred vision and double vision.  Cardiovascular:  Negative for chest pain.  Gastrointestinal:  Negative for abdominal pain, blood in stool, constipation and diarrhea.  Genitourinary: Negative.   Musculoskeletal:  Negative for falls and myalgias.  Skin:  Positive for rash.  Neurological:  Positive for headaches. Negative for speech change, loss of consciousness and weakness.  Psychiatric/Behavioral: Negative.        Objective:     BP 118/68 (BP Location: Left Arm, Patient Position: Sitting, Cuff Size: Normal)   Pulse 68   Temp 97.7 F (36.5 C) (Temporal)   Ht '5\' 1"'$  (1.549 m)   Wt 112 lb 9.6 oz (51.1 kg)   SpO2 98%   BMI 21.28 kg/m    Physical Exam Constitutional:      General: She is not in acute distress.    Appearance: Normal appearance. She is not ill-appearing, toxic-appearing or diaphoretic.  HENT:     Head: Normocephalic and atraumatic.     Right Ear: External ear normal.     Left Ear: External ear normal.     Mouth/Throat:     Mouth: Mucous membranes are moist.     Pharynx: Oropharynx is clear. No oropharyngeal exudate or posterior oropharyngeal erythema.   Eyes:     General: No scleral icterus.       Right eye: No discharge.        Left eye: No discharge.     Extraocular Movements: Extraocular movements intact.     Conjunctiva/sclera: Conjunctivae normal.     Pupils: Pupils are equal, round, and reactive to light.  Pulmonary:     Effort: Pulmonary effort is normal. No respiratory distress.  Musculoskeletal:     Cervical back: No rigidity or tenderness.  Skin:    General: Skin is warm and dry.       Neurological:     Mental Status: She is alert and oriented to person, place, and time.  Psychiatric:        Mood and Affect: Mood normal.        Behavior: Behavior normal.      No results found for any visits on 09/08/21.    The 10-year ASCVD risk score (Arnett DK, et al., 2019) is: 9.5%    Assessment & Plan:   Problem List Items Addressed This Visit       Cardiovascular and Mediastinum   Migraine   Relevant Medications   zolmitriptan (ZOMIG-ZMT) 2.5 MG disintegrating tablet   External hemorrhoids   Relevant Medications   hydrocortisone (ANUSOL-HC) 2.5 % rectal cream     Other  Cellulitis of finger of right hand - Primary   Relevant Medications   doxycycline (VIBRA-TABS) 100 MG tablet    Return in about 6 months (around 03/11/2022), or if symptoms worsen or fail to improve.    Libby Maw, MD

## 2021-10-01 ENCOUNTER — Telehealth: Payer: Self-pay | Admitting: Family Medicine

## 2021-10-01 NOTE — Telephone Encounter (Signed)
Left message for patient to call back and schedule Medicare Annual Wellness Visit (AWV).   Please offer to do virtually or by telephone.  Left office number and my jabber [phone].  Last AWV:10/08/2020  Please schedule at anytime with Nurse Health Advisor.

## 2021-10-06 ENCOUNTER — Ambulatory Visit: Payer: Medicare PPO | Attending: Cardiovascular Disease | Admitting: Cardiovascular Disease

## 2021-10-06 ENCOUNTER — Encounter: Payer: Self-pay | Admitting: Cardiovascular Disease

## 2021-10-06 VITALS — BP 124/64 | HR 69 | Ht 62.0 in | Wt 115.0 lb

## 2021-10-06 DIAGNOSIS — R002 Palpitations: Secondary | ICD-10-CM

## 2021-10-06 NOTE — Patient Instructions (Signed)
Medication Instructions:  No changes *If you need a refill on your cardiac medications before your next appointment, please call your pharmacy*   Lab Work: None ordered If you have labs (blood work) drawn today and your tests are completely normal, you will receive your results only by: [place] (if you have MyChart) OR A paper copy in the mail If you have any lab test that is abnormal or we need to change your treatment, we will call you to review the results.   Testing/Procedures: None ordered   Follow-Up: At [facility], you and your health needs are our priority.  As part of our continuing mission to provide you with exceptional heart care, we have created designated Provider Care Teams.  These Care Teams include your primary Cardiologist (physician) and Advanced Practice Providers (APPs -  Physician Assistants and Nurse Practitioners) who all work together to provide you with the care you need, when you need it.  We recommend signing up for the patient portal called "MyChart".  Sign up information is provided on this After Visit Summary.  MyChart is used to connect with patients for Virtual Visits (Telemedicine).  Patients are able to view lab/test results, encounter notes, upcoming appointments, etc.  Non-urgent messages can be sent to your provider as well.   To learn more about what you can do with MyChart, go to [email or web address].    Your next appointment:   12 month(s)  The format for your next appointment:   In Person  Provider:   Sanda Klein, MD     Other Instructions Send Kardia recordings when you have episodes of palpitations   Important Information About Sugar

## 2021-10-06 NOTE — Progress Notes (Signed)
Cardiology Consultation Note:    Date:  10/06/2021   ID:  [name], DOB [birth year], MRN [id number]  PCP:  Libby Maw, MD   [place]  Cardiologist:  Sanda Klein, MD NEW (remotely, Dr. Eustace Quail) Advanced Practice Provider:  No care team member to display Electrophysiologist:  None       Referring MD: Libby Maw,*   Chief Complaint  Patient presents with   Irregular Heart Beat     History of Present Illness:    [name] is a 71 y.o. female with a hx of paroxysmal atrial tachycardia more than 10 years ago, when she saw Dr. Olevia Rivera, treated with "as needed" beta blockers.   She has occasional palpitations. Her phone app (pulse based, no ECG) shows episodes of irregular rhythm.  The patient specifically denies any chest pain at rest exertion, dyspnea at rest or with exertion, orthopnea, paroxysmal nocturnal dyspnea, syncope, palpitations, focal neurological deficits, intermittent claudication, lower extremity edema, unexplained weight gain, cough, hemoptysis or wheezing.   Past medical history is significant for the absence of major chronic illnesses including no diabetes, hypertension, hypercholesterolemia, CAD, PVD, stroke or TIA, smoking. Excellent HDL and LDL.  She had an echocardiogram performed roughly 2 years ago with normal findings.  In the 1990s she had been told that she has mitral valve prolapse, but this was not confirmed on the most recent study.  We looked at the images together today and I agree that there is no evidence of mitral valve prolapse.  She had a normal stress echocardiogram in 2018.  Past Medical History:  Diagnosis Date   Anal fissure    Anemia    during pregnancy   Anxiety    Basal cell carcinoma    DDD (degenerative disc disease)    Diverticulosis    Hypokalemia    LBP (low back pain)    Melanoma (HCC)    Migraine    MVP (mitral valve prolapse) 1990   psa   Rosacea    Ulcer     as a child    Past Surgical History:  Procedure Laterality Date   ABDOMINAL HYSTERECTOMY  1985   BREAST BIOPSY Left    secondary to infection   I & D EXTREMITY Right 09/09/2012   Procedure: Minor IRRIGATION AND DEBRIDEMENT EXTREMITY paronychia of right thumb;  Surgeon: Wynonia Sours, MD;  Location: [place];  Service: Orthopedics;  Laterality: Right;   I & D EXTREMITY Left 01/26/2014   Procedure: MINOR IINCISION AND DRAINAGE paronychia left index finger;  Surgeon: Daryll Brod, MD;  Location: [place];  Service: Orthopedics;  Laterality: Left;  left index   OOPHORECTOMY Left 1994   REPAIR KNEE LIGAMENT Left 1989   TONSILLECTOMY  1955   TUBAL LIGATION     VEIN SURGERY      Current Medications: Current Meds  Medication Sig   Ascorbic Acid (VITAMIN C) 1000 MG tablet SMARTSIG:1 By Mouth   atenolol (TENORMIN) 25 MG tablet Take 0.5 tablets (12.5 mg total) by mouth daily as needed (for palpitations).   calcium-vitamin D 250-100 MG-UNIT per tablet Take 1 tablet by mouth daily.   clobetasol ointment (TEMOVATE) 0.05 % clobetasol 0.05 % topical ointment  APPLY TO VULVA TWICE WEEKLY   Doxylamine Succinate, Sleep, (SLEEP-AID PO) Take by mouth. [employer] sleep aid   estradiol (ESTRACE) 0.5 MG tablet Take 0.5 mg by mouth daily.   hydrocortisone (ANUSOL-HC) 2.5 % rectal  cream Place 1 Application rectally 2 (two) times daily.   L-LYSINE PO Take 1 tablet by mouth 2 (two) times a week.   Multiple Vitamin (MULTIVITAMIN) tablet Take 1 tablet by mouth daily.   Phenyleph-Diphenhyd-Hydrocod (HYDRO-DP PO) Take by mouth. Hydroeye   RESTASIS 0.05 % ophthalmic emulsion    SAMBUCOL BLACK ELDERBERRY PO Take 1 capsule by mouth 2 (two) times a week.   zolmitriptan (ZOMIG-ZMT) 2.5 MG disintegrating tablet TAKE 1 TABLET BY MOUTH AS NEEDED FOR MIGRAINE     Allergies:   Codeine, Compazine, Gabapentin, Ondansetron hcl, Other, Prednisone, Prochlorperazine edisylate, Sulfa antibiotics, and  Sulfonamide derivatives   Social History   Socioeconomic History   Marital status: Married    Spouse name: Not on file   Number of children: 2   Years of education: Not on file   Highest education level: Not on file  Occupational History   Occupation: retired school system  Tobacco Use   Smoking status: Never   Smokeless tobacco: Never  Vaping Use   Vaping Use: Never used  Substance and Sexual Activity   Alcohol use: Yes    Alcohol/week: 1.0 standard drink of alcohol    Types: 1 Standard drinks or equivalent per week    Comment: social wine   Drug use: No   Sexual activity: Yes  Other Topics Concern   Not on file  Social History Narrative   Not on file   Social Determinants of Health   Financial Resource Strain: Low Risk  (10/08/2020)   Overall Financial Resource Strain (CARDIA)    Difficulty of Paying Living Expenses: Not hard at all  Food Insecurity: No Food Insecurity (10/08/2020)   Hunger Vital Sign    Worried About Running Out of Food in the Last Year: Never true    [place] in the Last Year: Never true  Transportation Needs: No Transportation Needs (10/08/2020)   PRAPARE - Hydrologist (Medical): No    Lack of Transportation (Non-Medical): No  Physical Activity: Insufficiently Active (10/08/2020)   Exercise Vital Sign    Days of Exercise per Week: 3 days    Minutes of Exercise per Session: 30 min  Stress: No Stress Concern Present (10/08/2020)   [place]    Feeling of Stress : Not at all  Social Connections: [place] (10/08/2020)   Social Connection and Isolation Panel [NHANES]    Frequency of Communication with Friends and Family: More than three times a week    Frequency of Social Gatherings with Friends and Family: More than three times a week    Attends Religious Services: More than 4 times per year    Active Member of [employer] or Organizations: Yes     Attends Archivist Meetings: 1 to 4 times per year    Marital Status: Married     Family History: The patient's family history includes Colitis in her father; Colon polyps in her father and mother; Esophageal cancer in her maternal aunt; Heart disease in her father and mother. There is no history of Colon cancer.  ROS:   Please see the history of present illness.     All other systems reviewed and are negative.  EKGs/Labs/Other Studies Reviewed:    The following studies were reviewed today: Echocardiogram 2020, stress echocardiogram 2018  EKG:  EKG is  ordered today.  It shows NSR, normal tracing.  Recent Labs: 05/06/2021: ALT 16; BUN 16;  Creatinine, Ser 0.72; Hemoglobin 13.7; Platelets 302.0; Potassium 3.8; Sodium 137; TSH 0.98  Recent Lipid Panel    Component Value Date/Time   CHOL 176 05/06/2021 0937   TRIG 110.0 05/06/2021 0937   HDL 76.40 05/06/2021 0937   CHOLHDL 2 05/06/2021 0937   VLDL 22.0 05/06/2021 0937   LDLCALC 77 05/06/2021 0937     Risk Assessment/Calculations:       Physical Exam:    VS:  BP 124/64 (BP Location: Left Arm, Patient Position: Sitting, Cuff Size: Normal)   Pulse 69   Ht '5\' 2"'$  (1.575 m)   Wt 115 lb (52.2 kg)   SpO2 96%   BMI 21.03 kg/m     Wt Readings from Last 3 Encounters:  10/06/21 115 lb (52.2 kg)  09/08/21 112 lb 9.6 oz (51.1 kg)  05/06/21 113 lb 3.2 oz (51.3 kg)      General: Alert, oriented x3, no distress, lean, fit, appears younger than stated age Head: no evidence of trauma, PERRL, EOMI, no exophtalmos or lid lag, no myxedema, no xanthelasma; normal ears, nose and oropharynx Neck: normal jugular venous pulsations and no hepatojugular reflux; brisk carotid pulses without delay and no carotid bruits Chest: clear to auscultation, no signs of consolidation by percussion or palpation, normal fremitus, symmetrical and full respiratory excursions Cardiovascular: normal position and quality of the apical impulse,  regular rhythm, normal first and second heart sounds, no murmurs, rubs or gallops Abdomen: no tenderness or distention, no masses by palpation, no abnormal pulsatility or arterial bruits, normal bowel sounds, no hepatosplenomegaly Extremities: no clubbing, cyanosis or edema; 2+ radial, ulnar and brachial pulses bilaterally; 2+ right femoral, posterior tibial and dorsalis pedis pulses; 2+ left femoral, posterior tibial and dorsalis pedis pulses; no subclavian or femoral bruits Neurological: grossly nonfocal Psych: Normal mood and affect   ASSESSMENT:    1. Palpitations     PLAN:    In order of problems listed above:   Palpitations: Likely PACs or PVCs, but cannot exclude a more complex arrhythmia.  Can continue with her current prescription of as needed atenolol. Sending recordings from her Kardia device is the best way to identify the mechanism for her palpitations.       Medication Adjustments/Labs and Tests Ordered: Current medicines are reviewed at length with the patient today.  Concerns regarding medicines are outlined above.  Orders Placed This Encounter  Procedures   EKG 12-Lead   No orders of the defined types were placed in this encounter.   Patient Instructions  Medication Instructions:  No changes *If you need a refill on your cardiac medications before your next appointment, please call your pharmacy*   Lab Work: None ordered If you have labs (blood work) drawn today and your tests are completely normal, you will receive your results only by: [place] (if you have MyChart) OR A paper copy in the mail If you have any lab test that is abnormal or we need to change your treatment, we will call you to review the results.   Testing/Procedures: None ordered   Follow-Up: At [facility], you and your health needs are our priority.  As part of our continuing mission to provide you with exceptional heart care, we have created designated Provider  Care Teams.  These Care Teams include your primary Cardiologist (physician) and Advanced Practice Providers (APPs -  Physician Assistants and Nurse Practitioners) who all work together to provide you with the care you need, when you need it.  We  recommend signing up for the patient portal called "MyChart".  Sign up information is provided on this After Visit Summary.  MyChart is used to connect with patients for Virtual Visits (Telemedicine).  Patients are able to view lab/test results, encounter notes, upcoming appointments, etc.  Non-urgent messages can be sent to your provider as well.   To learn more about what you can do with MyChart, go to [email or web address].    Your next appointment:   12 month(s)  The format for your next appointment:   In Person  Provider:   Sanda Klein, MD     Other Instructions Send Kardia recordings when you have episodes of palpitations   Important Information About Sugar         Signed, Sanda Klein, MD  10/06/2021 8:58 PM    [place]

## 2021-10-21 ENCOUNTER — Ambulatory Visit: Payer: Medicare PPO | Admitting: Family Medicine

## 2021-10-22 DIAGNOSIS — L720 Epidermal cyst: Secondary | ICD-10-CM | POA: Diagnosis not present

## 2021-10-22 DIAGNOSIS — D2372 Other benign neoplasm of skin of left lower limb, including hip: Secondary | ICD-10-CM | POA: Diagnosis not present

## 2021-10-22 DIAGNOSIS — D2272 Melanocytic nevi of left lower limb, including hip: Secondary | ICD-10-CM | POA: Diagnosis not present

## 2021-10-22 DIAGNOSIS — D1801 Hemangioma of skin and subcutaneous tissue: Secondary | ICD-10-CM | POA: Diagnosis not present

## 2021-10-22 DIAGNOSIS — Z85828 Personal history of other malignant neoplasm of skin: Secondary | ICD-10-CM | POA: Diagnosis not present

## 2021-10-22 DIAGNOSIS — L821 Other seborrheic keratosis: Secondary | ICD-10-CM | POA: Diagnosis not present

## 2021-10-22 DIAGNOSIS — L814 Other melanin hyperpigmentation: Secondary | ICD-10-CM | POA: Diagnosis not present

## 2021-10-22 DIAGNOSIS — D225 Melanocytic nevi of trunk: Secondary | ICD-10-CM | POA: Diagnosis not present

## 2021-10-27 DIAGNOSIS — M180 Bilateral primary osteoarthritis of first carpometacarpal joints: Secondary | ICD-10-CM | POA: Diagnosis not present

## 2021-10-27 DIAGNOSIS — M545 Low back pain, unspecified: Secondary | ICD-10-CM | POA: Diagnosis not present

## 2021-10-27 DIAGNOSIS — M25572 Pain in left ankle and joints of left foot: Secondary | ICD-10-CM | POA: Diagnosis not present

## 2021-10-27 IMAGING — DX DG FOOT COMPLETE 3+V*L*
3 series · 3 of 3 positions shown · non-contrast
Comparison: None.

CLINICAL DATA: LEFT heel pain for 2-3 weeks.  No known injury.

EXAM:
LEFT FOOT - COMPLETE 3+ VIEW

[foot ap]
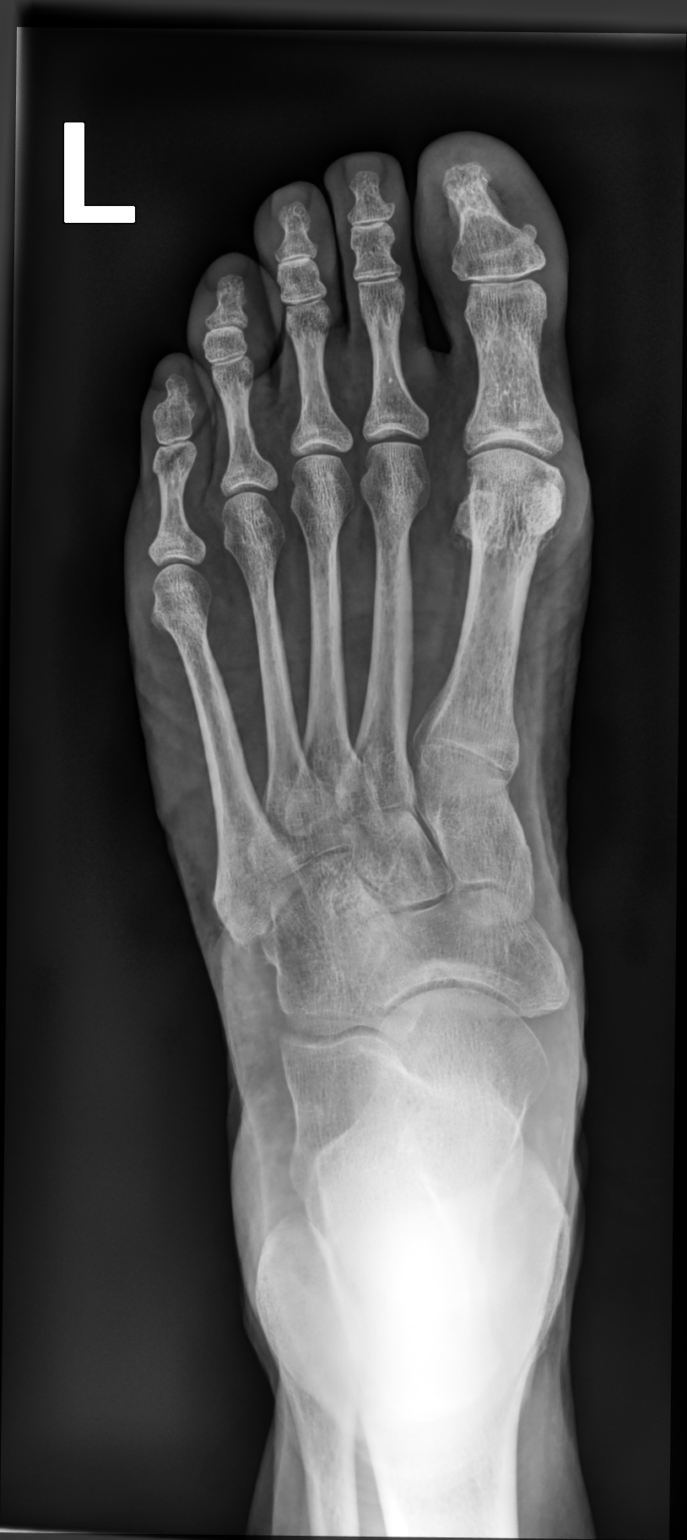

[foot mlo]
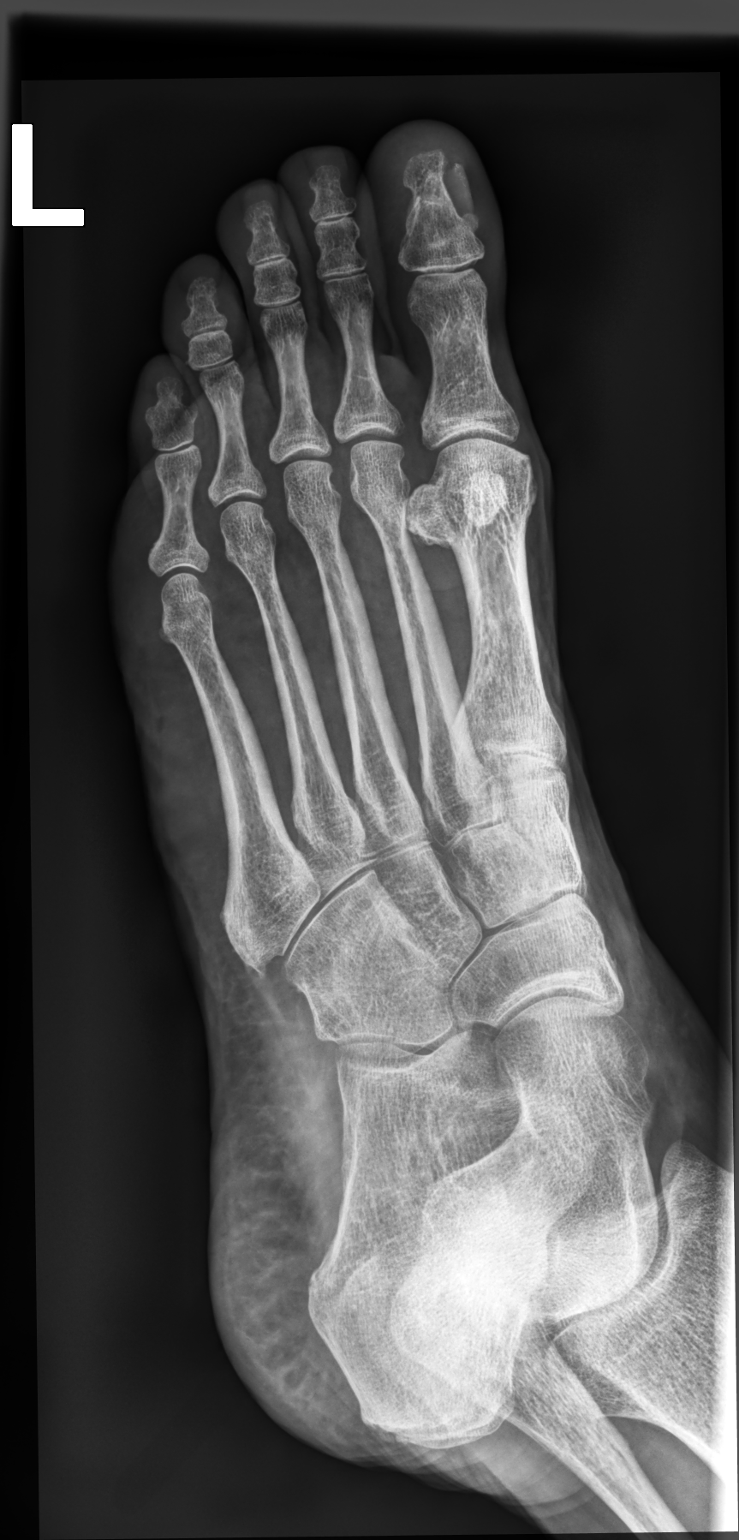

[foot lat]
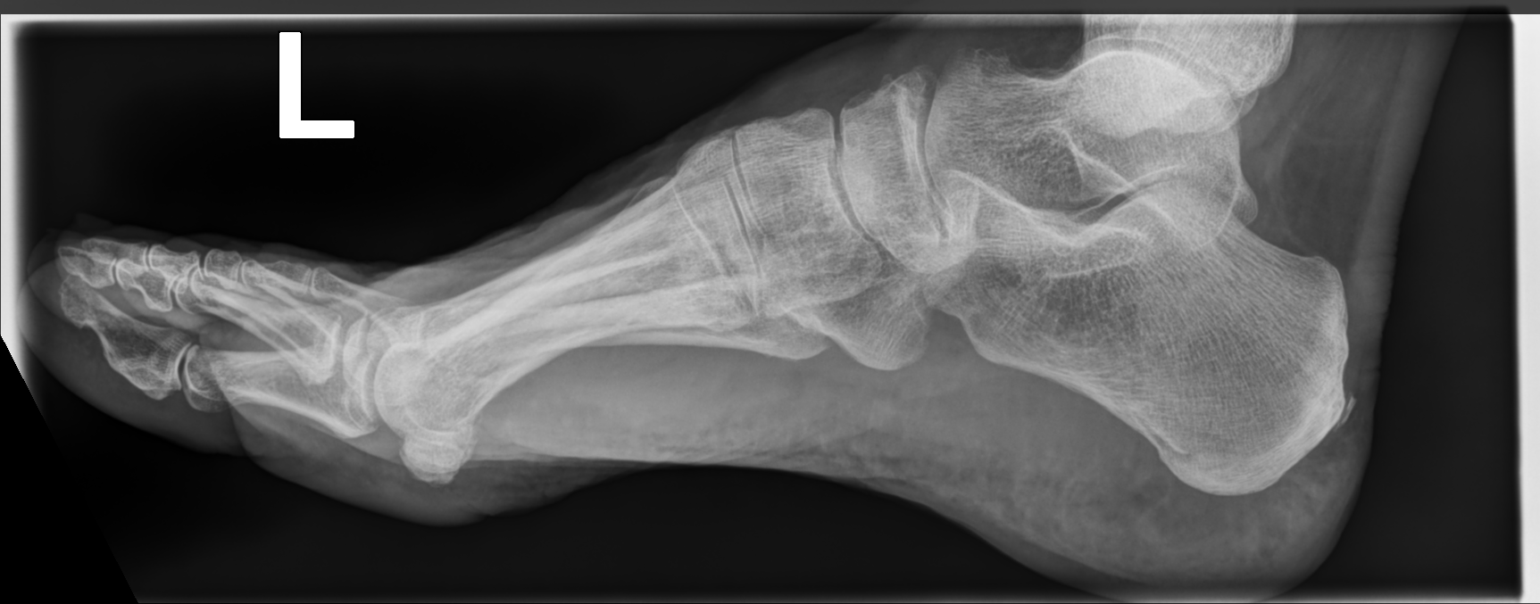

[3 of 3 positions shown; findings below may reference images not displayed]

FINDINGS: Osseous alignment is normal. No fracture line or displaced fracture
fragment is seen. No acute-appearing cortical irregularity or
osseous lesion.

Mild degenerative spurring within the midfoot. Additional mild
spurring at the dorsal margin of the posterior calcaneus, suggesting
chronic enthesopathy at the Achilles insertion site.

Soft tissues about the LEFT foot are unremarkable. No radiodense
foreign body is seen within the soft tissues.
IMPRESSION: 1. No acute findings.
2. Mild degenerative change within the midfoot.
3. Mild chronic enthesopathy at the Achilles insertion site.

## 2021-10-28 ENCOUNTER — Ambulatory Visit: Payer: Medicare PPO | Admitting: Family Medicine

## 2021-11-12 ENCOUNTER — Other Ambulatory Visit: Payer: Self-pay | Admitting: Family Medicine

## 2021-11-12 DIAGNOSIS — L03011 Cellulitis of right finger: Secondary | ICD-10-CM

## 2021-11-13 ENCOUNTER — Telehealth: Payer: Self-pay | Admitting: Family Medicine

## 2021-11-13 NOTE — Telephone Encounter (Signed)
Caller Name: pt Call back phone #: [phone]   MEDICATION(S):  doxycycline (VIBRA-TABS) 100 MG tablet [[id number]]   Days of Med Remaining: she's out  Has the patient contacted their pharmacy (YES/NO)? Yes, contact your PCP   Preferred Pharmacy:  [id number] [address], [address] AT [facility] [address]  [place], [place] [address]  Phone:  [phone]  Fax:  [phone]  DEA #:  [id number]

## 2021-11-13 NOTE — Telephone Encounter (Signed)
Spoke with pt she has already contacted other doctor's office and is waiting on a response. She will reach out when the other office contacts her about the medication refill.

## 2021-11-13 NOTE — Telephone Encounter (Signed)
Caller Name: Riely Oetken Call back phone #: [phone]  Reason for Call: Prescription has been called in by other doctor

## 2021-11-17 ENCOUNTER — Ambulatory Visit: Payer: Medicare PPO | Attending: Orthopedic Surgery | Admitting: Physical Therapy

## 2021-11-17 DIAGNOSIS — M62830 Muscle spasm of back: Secondary | ICD-10-CM

## 2021-11-17 DIAGNOSIS — M25675 Stiffness of left foot, not elsewhere classified: Secondary | ICD-10-CM | POA: Diagnosis not present

## 2021-11-17 DIAGNOSIS — M5459 Other low back pain: Secondary | ICD-10-CM

## 2021-11-17 DIAGNOSIS — R262 Difficulty in walking, not elsewhere classified: Secondary | ICD-10-CM | POA: Diagnosis not present

## 2021-11-17 DIAGNOSIS — R252 Cramp and spasm: Secondary | ICD-10-CM | POA: Diagnosis not present

## 2021-11-17 DIAGNOSIS — M79672 Pain in left foot: Secondary | ICD-10-CM | POA: Diagnosis not present

## 2021-11-17 NOTE — Therapy (Signed)
OUTPATIENT PHYSICAL THERAPY THORACOLUMBAR EVALUATION   Patient Name: [name] MRN: [id number] DOB:April 21, 1950, 71 y.o., female Today's Date: 11/17/2021   PT End of Session - 11/17/21 1311     Visit Number 1    Number of Visits 13    Date for PT Re-Evaluation 02/08/22    Authorization Type [employer]    PT Start Time 1311    PT Stop Time 2979    PT Time Calculation (min) 44 min    Activity Tolerance Patient tolerated treatment well    Behavior During Therapy WFL for tasks assessed/performed             Past Medical History:  Diagnosis Date   Anal fissure    Anemia    during pregnancy   Anxiety    Basal cell carcinoma    DDD (degenerative disc disease)    Diverticulosis    Hypokalemia    LBP (low back pain)    Melanoma (HCC)    Migraine    MVP (mitral valve prolapse) 1990   psa   Rosacea    Ulcer    as a child   Past Surgical History:  Procedure Laterality Date   ABDOMINAL HYSTERECTOMY  1985   BREAST BIOPSY Left    secondary to infection   I & D EXTREMITY Right 09/09/2012   Procedure: Minor IRRIGATION AND DEBRIDEMENT EXTREMITY paronychia of right thumb;  Surgeon: Wynonia Sours, MD;  Location: [place];  Service: Orthopedics;  Laterality: Right;   I & D EXTREMITY Left 01/26/2014   Procedure: MINOR IINCISION AND DRAINAGE paronychia left index finger;  Surgeon: Daryll Brod, MD;  Location: [place];  Service: Orthopedics;  Laterality: Left;  left index   OOPHORECTOMY Left 1994   REPAIR KNEE LIGAMENT Left [place]   TUBAL LIGATION     VEIN SURGERY     Patient Active Problem List   Diagnosis Date Noted   Cellulitis of finger of right hand 09/08/2021   Infection of skin 05/13/2021   Cervicalgia 05/06/2021   Grieving 05/06/2021   Conductive hearing loss of right ear 03/04/2021   Impacted cerumen of right ear 03/04/2021   Medication side effect 03/03/2021   Excessive cerumen in right ear canal 03/03/2021    Labyrinthitis 07/30/2020   Palpitations 04/15/2020   Plantar fasciitis 04/15/2020   Dysthymia 04/15/2020   Vitamin D deficiency 04/15/2020   Gastroesophageal reflux disease 04/15/2020   Insect bite of left foot 11/24/2019   Pustule 11/24/2019   Right hand pain 03/09/2019   External hemorrhoids 03/03/2018   Other chest pain 02/23/2018   Insomnia 02/24/2017   Healthcare maintenance 02/24/2017   Conjunctivitis 03/01/2014   Seborrheic keratosis, inflamed 01/05/2012   Menopausal syndrome 12/22/2011   Encounter for counseling 11/11/2011   Skin cancer 07/14/2011   Basal cell carcinoma of face 03/10/2011   Migraine 05/14/2007   IRRITABLE BOWEL SYNDROME 05/14/2007   History of mitral valve prolapse 05/14/2007   Allergic rhinitis 04/07/2007   DERMATITIS DUE TO SOLAR RADIATION 04/07/2007   Premature beat 09/20/2006   DIVERTICULOSIS, COLON 04/13/2002    PCP: Ethelene Hal  REFERRING PROVIDER: Marquette Saa DIAG:  LBP with peroneal tendonitis, lumbar radiculopathy  Rationale for Evaluation and Treatment Rehabilitation  THERAPY DIAG:  Other low back pain  Muscle spasm of back  ONSET DATE: August 2023  SUBJECTIVE:  SUBJECTIVE STATEMENT: Patient reports that she has had the left foot and ankle pain for a long period of time, we have seen here in the past for PF, MD sent referral for LBP with radiculopathy and left peroneal tendonitis.  She reports that back pain has been going on for a number of years and feels that she is weak.  Foot pain recently was her walking on the beach PERTINENT HISTORY:  See above  PAIN:  Are you having pain? Yes: NPRS scale: 7/10 Pain location: left dorsum of the foot along the 5th ray and then the top of the foot, has pain at times in the lumbar area and into the SI Pain  description: sharp in the foot, dull in the back Aggravating factors: walking, at times just a sharp pain Relieving factors: medication pain can be down to a 3/10   PRECAUTIONS: None  WEIGHT BEARING RESTRICTIONS No  FALLS:  Has patient fallen in last 6 months? No  LIVING ENVIRONMENT: Lives with: lives with their family Lives in: House/apartment Stairs: Yes: Internal: 12 steps; can reach both Has following equipment at home: None  OCCUPATION: retired  PLOF: Independent and walks some for exercise, golf a few times a year  PATIENT GOALS have less pain, walk without issue   OBJECTIVE:   DIAGNOSTIC FINDINGS:  X-rays negative  PATIENT SURVEYS:  ODI 52%  COGNITION:  Overall cognitive status: Within functional limits for tasks assessed     SENSATION: WFL  MUSCLE LENGTH: Very tight in the HS, calves and piriformis mms  POSTURE:  good posture  PALPATION: Tender in the SI and buttocks, spasms in the lumbar area, very tender dorsal and lateral left foot  LUMBAR ROM:   Active  A/PROM  eval  Flexion Decreased 25%  Extension Decreased 50%  Right lateral flexion Decreased 25%  Left lateral flexion Decreased 25%  Right rotation   Left rotation    (Blank rows = not tested)  LOWER EXTREMITY ROM:     Active  Right eval Left eval  Hip flexion    Hip extension    Hip abduction    Hip adduction    Hip internal rotation    Hip external rotation    Knee flexion    Knee extension    Ankle dorsiflexion  0  Ankle plantarflexion  45  Ankle inversion  10  Ankle eversion  5   (Blank rows = not tested)  LOWER EXTREMITY MMT:    MMT Right eval Left eval  Hip flexion 4- 4-  Hip extension    Hip abduction 4- 4-  Hip adduction    Hip internal rotation    Hip external rotation    Knee flexion    Knee extension    Ankle dorsiflexion 4+ 4-  Ankle plantarflexion    Ankle inversion    Ankle eversion 4+ 4- P!   (Blank rows = not tested)  GAIT: Distance walked:  100 feet Assistive device utilized: None Level of assistance: Complete Independence Comments: antalgic on the left     TODAY'S TREATMENT  Calf stretches, piriformis stretches   PATIENT EDUCATION:  Education details: Calf and piriformis stretch for HEP Person educated: Patient Education method: Consulting civil engineer, Demonstration, Tactile cues, and Verbal cues Education comprehension: verbalized understanding and returned demonstration   HOME EXERCISE PROGRAM: Calf and piriformis stretches  ASSESSMENT:  CLINICAL IMPRESSION: Patient is a 71 y.o. female who was seen today for physical therapy evaluation and treatment for LBP, left radiculopathy and left  peroneal tendonitis.  She reports that recently walking on the beach she had very severe pain I the left foot and some in the left buttock, reports that she continues to have foot pain worse but still with back pain and tightness, she has limitations in lumbar and left ankle ROM, weak core    OBJECTIVE IMPAIRMENTS Abnormal gait, cardiopulmonary status limiting activity, decreased activity tolerance, decreased balance, decreased endurance, decreased mobility, difficulty walking, decreased ROM, decreased strength, increased muscle spasms, impaired flexibility, improper body mechanics, postural dysfunction, and pain.   REHAB POTENTIAL: Good  CLINICAL DECISION MAKING: Stable/uncomplicated  EVALUATION COMPLEXITY: Low   GOALS: Goals reviewed with patient? Yes  SHORT TERM GOALS: Target date: 12/01/21  Independent with initial HEP Goal status: INITIAL  LONG TERM GOALS: Target date: 03/04/22  Independent with advanced HEP Goal status: INITIAL  2.  Understand posture and body mechanics Goal status: INITIAL  3.  Decrease pain 50% Goal status: INITIAL  4.  Walk without difficulty Goal status: INITIAL  5.  Increase left ankle DF to 10 degrees Goal status: INITIAL  6.  Increase lumbar ROM to WNL's Goal status: INITIAL   PLAN: PT  FREQUENCY: 1-2x/week  PT DURATION: 12 weeks  PLANNED INTERVENTIONS: Therapeutic exercises, Therapeutic activity, Neuromuscular re-education, Balance training, Gait training, Patient/Family education, Self Care, Joint mobilization, Electrical stimulation, Spinal mobilization, Cryotherapy, Moist heat, Traction, Ultrasound, and Manual therapy.  PLAN FOR NEXT SESSION: start core strength and flexibility   Sahar Ryback W, PT 11/17/2021, 1:12 PM

## 2021-11-19 ENCOUNTER — Ambulatory Visit: Payer: Medicare PPO | Admitting: Cardiovascular Disease

## 2021-12-02 ENCOUNTER — Encounter: Payer: Self-pay | Admitting: Physical Therapy

## 2021-12-02 ENCOUNTER — Ambulatory Visit: Payer: Medicare PPO | Admitting: Physical Therapy

## 2021-12-02 DIAGNOSIS — M25675 Stiffness of left foot, not elsewhere classified: Secondary | ICD-10-CM

## 2021-12-02 DIAGNOSIS — M62830 Muscle spasm of back: Secondary | ICD-10-CM | POA: Diagnosis not present

## 2021-12-02 DIAGNOSIS — M5459 Other low back pain: Secondary | ICD-10-CM | POA: Diagnosis not present

## 2021-12-02 DIAGNOSIS — R262 Difficulty in walking, not elsewhere classified: Secondary | ICD-10-CM | POA: Diagnosis not present

## 2021-12-02 DIAGNOSIS — R252 Cramp and spasm: Secondary | ICD-10-CM | POA: Diagnosis not present

## 2021-12-02 DIAGNOSIS — M79672 Pain in left foot: Secondary | ICD-10-CM

## 2021-12-02 NOTE — Therapy (Signed)
OUTPATIENT PHYSICAL THERAPY THORACOLUMBAR EVALUATION   Patient Name: [name] MRN: [id number] DOB:February 21, 1950, 71 y.o., female Today's Date: 12/02/2021   PT End of Session - 12/02/21 1101     Visit Number 2    Number of Visits 13    Date for PT Re-Evaluation 02/08/22    Authorization Type [employer]    Authorization Time Period 1/12    PT Start Time 1055    PT Stop Time 1145    PT Time Calculation (min) 50 min    Activity Tolerance Patient tolerated treatment well    Behavior During Therapy WFL for tasks assessed/performed             Past Medical History:  Diagnosis Date   Anal fissure    Anemia    during pregnancy   Anxiety    Basal cell carcinoma    DDD (degenerative disc disease)    Diverticulosis    Hypokalemia    LBP (low back pain)    Melanoma (HCC)    Migraine    MVP (mitral valve prolapse) 1990   psa   Rosacea    Ulcer    as a child   Past Surgical History:  Procedure Laterality Date   ABDOMINAL HYSTERECTOMY  1985   BREAST BIOPSY Left    secondary to infection   I & D EXTREMITY Right 09/09/2012   Procedure: Minor IRRIGATION AND DEBRIDEMENT EXTREMITY paronychia of right thumb;  Surgeon: Wynonia Sours, MD;  Location: [place];  Service: Orthopedics;  Laterality: Right;   I & D EXTREMITY Left 01/26/2014   Procedure: MINOR IINCISION AND DRAINAGE paronychia left index finger;  Surgeon: Daryll Brod, MD;  Location: [place];  Service: Orthopedics;  Laterality: Left;  left index   OOPHORECTOMY Left 1994   REPAIR KNEE LIGAMENT Left [place]   TUBAL LIGATION     VEIN SURGERY     Patient Active Problem List   Diagnosis Date Noted   Cellulitis of finger of right hand 09/08/2021   Infection of skin 05/13/2021   Cervicalgia 05/06/2021   Grieving 05/06/2021   Conductive hearing loss of right ear 03/04/2021   Impacted cerumen of right ear 03/04/2021   Medication side effect 03/03/2021   Excessive cerumen  in right ear canal 03/03/2021   Labyrinthitis 07/30/2020   Palpitations 04/15/2020   Plantar fasciitis 04/15/2020   Dysthymia 04/15/2020   Vitamin D deficiency 04/15/2020   Gastroesophageal reflux disease 04/15/2020   Insect bite of left foot 11/24/2019   Pustule 11/24/2019   Right hand pain 03/09/2019   External hemorrhoids 03/03/2018   Other chest pain 02/23/2018   Insomnia 02/24/2017   Healthcare maintenance 02/24/2017   Conjunctivitis 03/01/2014   Seborrheic keratosis, inflamed 01/05/2012   Menopausal syndrome 12/22/2011   Encounter for counseling 11/11/2011   Skin cancer 07/14/2011   Basal cell carcinoma of face 03/10/2011   Migraine 05/14/2007   IRRITABLE BOWEL SYNDROME 05/14/2007   History of mitral valve prolapse 05/14/2007   Allergic rhinitis 04/07/2007   DERMATITIS DUE TO SOLAR RADIATION 04/07/2007   Premature beat 09/20/2006   DIVERTICULOSIS, COLON 04/13/2002    PCP: Ethelene Hal  REFERRING PROVIDER: Marquette Saa DIAG:  LBP with peroneal tendonitis, lumbar radiculopathy  Rationale for Evaluation and Treatment Rehabilitation  THERAPY DIAG:  Other low back pain  Muscle spasm of back  Pain in left foot  Difficulty in walking, not elsewhere classified  Cramp and spasm  Stiffness  of left foot, not elsewhere classified  ONSET DATE: August 2023  SUBJECTIVE:                                                                                                                                                                                           SUBJECTIVE STATEMENT: Patient reports still hurting in the left foot and some in the back, reports that she walked on the beach and it hurt PERTINENT HISTORY:  See above  PAIN:  Are you having pain? Yes: NPRS scale: 7/10 Pain location: left dorsum of the foot along the 5th ray and then the top of the foot, has pain at times in the lumbar area and into the SI Pain description: sharp in the foot, dull in the  back Aggravating factors: walking, at times just a sharp pain Relieving factors: medication pain can be down to a 3/10   PRECAUTIONS: None  WEIGHT BEARING RESTRICTIONS No  FALLS:  Has patient fallen in last 6 months? No  LIVING ENVIRONMENT: Lives with: lives with their family Lives in: House/apartment Stairs: Yes: Internal: 12 steps; can reach both Has following equipment at home: None  OCCUPATION: retired  PLOF: Independent and walks some for exercise, golf a few times a year  PATIENT GOALS have less pain, walk without issue   OBJECTIVE:   DIAGNOSTIC FINDINGS:  X-rays negative  PATIENT SURVEYS:  ODI 52%  COGNITION:  Overall cognitive status: Within functional limits for tasks assessed     SENSATION: WFL  MUSCLE LENGTH: Very tight in the HS, calves and piriformis mms  POSTURE:  good posture  PALPATION: Tender in the SI and buttocks, spasms in the lumbar area, very tender dorsal and lateral left foot  LUMBAR ROM:   Active  A/PROM  eval  Flexion Decreased 25%  Extension Decreased 50%  Right lateral flexion Decreased 25%  Left lateral flexion Decreased 25%  Right rotation   Left rotation    (Blank rows = not tested)  LOWER EXTREMITY ROM:     Active  Right eval Left eval  Hip flexion    Hip extension    Hip abduction    Hip adduction    Hip internal rotation    Hip external rotation    Knee flexion    Knee extension    Ankle dorsiflexion  0  Ankle plantarflexion  45  Ankle inversion  10  Ankle eversion  5   (Blank rows = not tested)  LOWER EXTREMITY MMT:    MMT Right eval Left eval  Hip flexion 4- 4-  Hip extension    Hip abduction 4- 4-  Hip  adduction    Hip internal rotation    Hip external rotation    Knee flexion    Knee extension    Ankle dorsiflexion 4+ 4-  Ankle plantarflexion    Ankle inversion    Ankle eversion 4+ 4- P!   (Blank rows = not tested)  GAIT: Distance walked: 100 feet Assistive device utilized:  None Level of assistance: Complete Independence Comments: antalgic on the left     TODAY'S TREATMENT  12/02/21 Nustep level 5 x 6 minutes Calf stretches Rows 15# 2x10 Lats 15# 2x10 Seated ankle motions on the dynadisc Red tband ankle all motions 2x10 Feet on ball K2C, trunk rotation, small bridge, isometric abs Ionto dorsum of the left foot, 48m dose dex IONTOPHORESIS PATIENT PRECAUTIONS & CONTRAINDICATIONS:  Redness under one or both electrodes can occur.  This characterized by a uniform redness that usually disappears within 12 hours of treatment. Small pinhead size blisters may result in response to the drug.  Contact your physician if the problem persists more than 24 hours. On rare occasions, iontophoresis therapy can result in temporary skin reactions such as rash, inflammation, irritation or burns.  The skin reactions may be the result of individual sensitivity to the ionic solution used, the condition of the skin at the start of treatment, reaction to the materials in the electrodes, allergies or sensitivity to dexamethasone, or a poor connection between the patch and your skin.  Discontinue using iontophoresis if you have any of these reactions and report to your therapist. Remove the Patch or electrodes if you have any undue sensation of pain or burning during the treatment and report discomfort to your therapist. Tell your Therapist if you have had known adverse reactions to the application of electrical current. If using the Patch, the LED light will turn off when treatment is complete and the patch can be removed.  Approximate treatment time is 1-3 hours.  Remove the patch when light goes off or after 6 hours. The Patch can be worn during normal activity, however excessive motion where the electrodes have been placed can cause poor contact between the skin and the electrode or uneven electrical current resulting in greater risk of skin irritation. Keep out of the reach of  children.   DO NOT use if you have a cardiac pacemaker or any other electrically sensitive implanted device. DO NOT use if you have a known sensitivity to dexamethasone. DO NOT use during Magnetic Resonance Imaging (MRI). DO NOT use over broken or compromised skin (e.g. sunburn, cuts, or acne) due to the increased risk of skin reaction. DO NOT SHAVE over the area to be treated:  To establish good contact between the Patch and the skin, excessive hair may be clipped. DO NOT place the Patch or electrodes on or over your eyes, directly over your heart, or brain. DO NOT reuse the Patch or electrodes as this may cause burns to occur.     PATIENT EDUCATION:  Education details: Calf and piriformis stretch for HEP Person educated: Patient Education method: Explanation, Demonstration, Tactile cues, and Verbal cues Education comprehension: verbalized understanding and returned demonstration   HOME EXERCISE PROGRAM: Calf and piriformis stretches  ASSESSMENT:  CLINICAL IMPRESSION: Patient reports left dorsal foot pain and still some back pain, she was at the beach the past two weeks.  Walking on the beach increased foot pain, she does report difficulty with ADL's with the back   OBJECTIVE IMPAIRMENTS Abnormal gait, cardiopulmonary status limiting activity, decreased activity tolerance, decreased  balance, decreased endurance, decreased mobility, difficulty walking, decreased ROM, decreased strength, increased muscle spasms, impaired flexibility, improper body mechanics, postural dysfunction, and pain.   REHAB POTENTIAL: Good  CLINICAL DECISION MAKING: Stable/uncomplicated  EVALUATION COMPLEXITY: Low   GOALS: Goals reviewed with patient? Yes  SHORT TERM GOALS: Target date: 12/01/21  Independent with initial HEP Goal status: ongoing  LONG TERM GOALS: Target date: 03/04/22  Independent with advanced HEP Goal status: INITIAL  2.  Understand posture and body mechanics Goal status:  INITIAL  3.  Decrease pain 50% Goal status: INITIAL  4.  Walk without difficulty Goal status: INITIAL  5.  Increase left ankle DF to 10 degrees Goal status: INITIAL  6.  Increase lumbar ROM to WNL's Goal status: INITIAL   PLAN: PT FREQUENCY: 1-2x/week  PT DURATION: 12 weeks  PLANNED INTERVENTIONS: Therapeutic exercises, Therapeutic activity, Neuromuscular re-education, Balance training, Gait training, Patient/Family education, Self Care, Joint mobilization, Electrical stimulation, Spinal mobilization, Cryotherapy, Moist heat, Traction, Ultrasound, and Manual therapy.  PLAN FOR NEXT SESSION: start core strength and flexibility, see how ionto does   Jonluke Cobbins W, PT 12/02/2021, 11:02 AM

## 2021-12-03 ENCOUNTER — Telehealth: Payer: Self-pay | Admitting: Family Medicine

## 2021-12-03 NOTE — Telephone Encounter (Signed)
Pt is wondering if she needs a RSV shot? Please advise pt @ [phone]

## 2021-12-03 NOTE — Telephone Encounter (Signed)
Patient verbally understood Dr. Ethelene Hal is recommending RSV vaccine.

## 2021-12-09 ENCOUNTER — Ambulatory Visit: Payer: Medicare PPO | Admitting: Physical Therapy

## 2021-12-09 ENCOUNTER — Encounter: Payer: Self-pay | Admitting: Physical Therapy

## 2021-12-09 DIAGNOSIS — M62830 Muscle spasm of back: Secondary | ICD-10-CM | POA: Diagnosis not present

## 2021-12-09 DIAGNOSIS — M5459 Other low back pain: Secondary | ICD-10-CM

## 2021-12-09 DIAGNOSIS — M79672 Pain in left foot: Secondary | ICD-10-CM

## 2021-12-09 DIAGNOSIS — M25675 Stiffness of left foot, not elsewhere classified: Secondary | ICD-10-CM

## 2021-12-09 DIAGNOSIS — R262 Difficulty in walking, not elsewhere classified: Secondary | ICD-10-CM

## 2021-12-09 DIAGNOSIS — R252 Cramp and spasm: Secondary | ICD-10-CM

## 2021-12-09 NOTE — Therapy (Signed)
OUTPATIENT PHYSICAL THERAPY THORACOLUMBAR EVALUATION   Patient Name: [name] MRN: [id number] DOB:September 27, 1950, 71 y.o., female Today's Date: 12/09/2021   PT End of Session - 12/09/21 1101     Visit Number 3    Number of Visits 13    Date for PT Re-Evaluation 02/08/22    Authorization Type [employer]    PT Start Time 1056    PT Stop Time 1143    PT Time Calculation (min) 47 min    Activity Tolerance Patient tolerated treatment well    Behavior During Therapy WFL for tasks assessed/performed             Past Medical History:  Diagnosis Date   Anal fissure    Anemia    during pregnancy   Anxiety    Basal cell carcinoma    DDD (degenerative disc disease)    Diverticulosis    Hypokalemia    LBP (low back pain)    Melanoma (HCC)    Migraine    MVP (mitral valve prolapse) 1990   psa   Rosacea    Ulcer    as a child   Past Surgical History:  Procedure Laterality Date   ABDOMINAL HYSTERECTOMY  1985   BREAST BIOPSY Left    secondary to infection   I & D EXTREMITY Right 09/09/2012   Procedure: Minor IRRIGATION AND DEBRIDEMENT EXTREMITY paronychia of right thumb;  Surgeon: Wynonia Sours, MD;  Location: [place];  Service: Orthopedics;  Laterality: Right;   I & D EXTREMITY Left 01/26/2014   Procedure: MINOR IINCISION AND DRAINAGE paronychia left index finger;  Surgeon: Daryll Brod, MD;  Location: [place];  Service: Orthopedics;  Laterality: Left;  left index   OOPHORECTOMY Left 1994   REPAIR KNEE LIGAMENT Left [place]   TUBAL LIGATION     VEIN SURGERY     Patient Active Problem List   Diagnosis Date Noted   Cellulitis of finger of right hand 09/08/2021   Infection of skin 05/13/2021   Cervicalgia 05/06/2021   Grieving 05/06/2021   Conductive hearing loss of right ear 03/04/2021   Impacted cerumen of right ear 03/04/2021   Medication side effect 03/03/2021   Excessive cerumen in right ear canal 03/03/2021    Labyrinthitis 07/30/2020   Palpitations 04/15/2020   Plantar fasciitis 04/15/2020   Dysthymia 04/15/2020   Vitamin D deficiency 04/15/2020   Gastroesophageal reflux disease 04/15/2020   Insect bite of left foot 11/24/2019   Pustule 11/24/2019   Right hand pain 03/09/2019   External hemorrhoids 03/03/2018   Other chest pain 02/23/2018   Insomnia 02/24/2017   Healthcare maintenance 02/24/2017   Conjunctivitis 03/01/2014   Seborrheic keratosis, inflamed 01/05/2012   Menopausal syndrome 12/22/2011   Encounter for counseling 11/11/2011   Skin cancer 07/14/2011   Basal cell carcinoma of face 03/10/2011   Migraine 05/14/2007   IRRITABLE BOWEL SYNDROME 05/14/2007   History of mitral valve prolapse 05/14/2007   Allergic rhinitis 04/07/2007   DERMATITIS DUE TO SOLAR RADIATION 04/07/2007   Premature beat 09/20/2006   DIVERTICULOSIS, COLON 04/13/2002    PCP: Ethelene Hal  REFERRING PROVIDER: Marquette Saa DIAG:  LBP with peroneal tendonitis, lumbar radiculopathy  Rationale for Evaluation and Treatment Rehabilitation  THERAPY DIAG:  Other low back pain  Muscle spasm of back  Pain in left foot  Difficulty in walking, not elsewhere classified  Cramp and spasm  Stiffness of left foot, not elsewhere classified  ONSET DATE: August 2023  SUBJECTIVE:                                                                                                                                                                                           SUBJECTIVE STATEMENT: Reports that she was at the beachj over the weekend and drove back yesterday, reports that she had some increased foot pain last night PERTINENT HISTORY:  See above  PAIN:  Are you having pain? Yes: NPRS scale: 7/10 Pain location: left dorsum of the foot along the 5th ray and then the top of the foot, has pain at times in the lumbar area and into the SI Pain description: sharp in the foot, dull in the back Aggravating factors:  walking, at times just a sharp pain Relieving factors: medication pain can be down to a 3/10   PRECAUTIONS: None  WEIGHT BEARING RESTRICTIONS No  FALLS:  Has patient fallen in last 6 months? No  LIVING ENVIRONMENT: Lives with: lives with their family Lives in: House/apartment Stairs: Yes: Internal: 12 steps; can reach both Has following equipment at home: None  OCCUPATION: retired  PLOF: Independent and walks some for exercise, golf a few times a year  PATIENT GOALS have less pain, walk without issue   OBJECTIVE:   DIAGNOSTIC FINDINGS:  X-rays negative  PATIENT SURVEYS:  ODI 52%  COGNITION:  Overall cognitive status: Within functional limits for tasks assessed     SENSATION: WFL  MUSCLE LENGTH: Very tight in the HS, calves and piriformis mms  POSTURE:  good posture  PALPATION: Tender in the SI and buttocks, spasms in the lumbar area, very tender dorsal and lateral left foot  LUMBAR ROM:   Active  A/PROM  eval  Flexion Decreased 25%  Extension Decreased 50%  Right lateral flexion Decreased 25%  Left lateral flexion Decreased 25%  Right rotation   Left rotation    (Blank rows = not tested)  LOWER EXTREMITY ROM:     Active  Right eval Left eval  Hip flexion    Hip extension    Hip abduction    Hip adduction    Hip internal rotation    Hip external rotation    Knee flexion    Knee extension    Ankle dorsiflexion  0  Ankle plantarflexion  45  Ankle inversion  10  Ankle eversion  5   (Blank rows = not tested)  LOWER EXTREMITY MMT:    MMT Right eval Left eval  Hip flexion 4- 4-  Hip extension    Hip abduction 4- 4-  Hip adduction    Hip internal  rotation    Hip external rotation    Knee flexion    Knee extension    Ankle dorsiflexion 4+ 4-  Ankle plantarflexion    Ankle inversion    Ankle eversion 4+ 4- P!   (Blank rows = not tested)  GAIT: Distance walked: 100 feet Assistive device utilized: None Level of assistance:  Complete Independence Comments: antalgic on the left     TODAY'S TREATMENT  12/09/21 Nustep level 5 x 6 minutes Towel scrunches Calf stretches Red tband all ankle motions STM to the dorsum of the foot, toe stretches Joint mobs to the mid foot and metatarsals Ionto to the dorsum of the left foot 42m dose with dex   12/02/21 Nustep level 5 x 6 minutes Calf stretches Rows 15# 2x10 Lats 15# 2x10 Seated ankle motions on the dynadisc Red tband ankle all motions 2x10 Feet on ball K2C, trunk rotation, small bridge, isometric abs Ionto dorsum of the left foot, 84mdose dex    PATIENT EDUCATION:  Education details: Calf and piriformis stretch for HEP Person educated: Patient Education method: ExConsulting civil engineerDemonstration, Tactile cues, and Verbal cues Education comprehension: verbalized understanding and returned demonstration   HOME EXERCISE PROGRAM: Calf and piriformis stretches  ASSESSMENT:  CLINICAL IMPRESSION: Patient reports that she was doing well until yesterday, had been at the beach over the past week and drove back yesterday, reports that last night she really had pain a 9/10 in the dorsum of the left foot.  She is tight in the calves, has some difficulty with active toe flexion, she reports that the first few steps after sitting will be the worst with her pain and difficulty walking.  She is very tender int he mms between the metatarsals   OBJECTIVE IMPAIRMENTS Abnormal gait, cardiopulmonary status limiting activity, decreased activity tolerance, decreased balance, decreased endurance, decreased mobility, difficulty walking, decreased ROM, decreased strength, increased muscle spasms, impaired flexibility, improper body mechanics, postural dysfunction, and pain.   REHAB POTENTIAL: Good  CLINICAL DECISION MAKING: Stable/uncomplicated  EVALUATION COMPLEXITY: Low   GOALS: Goals reviewed with patient? Yes  SHORT TERM GOALS: Target date: 12/01/21  Independent  with initial HEP Goal status: ongoing  LONG TERM GOALS: Target date: 03/04/22  Independent with advanced HEP Goal status: INITIAL  2.  Understand posture and body mechanics Goal status: INITIAL  3.  Decrease pain 50% Goal status:ongoing  4.  Walk without difficulty Goal status: ongoing  5.  Increase left ankle DF to 10 degrees Goal status: INITIAL  6.  Increase lumbar ROM to WNL's Goal status: INITIAL   PLAN: PT FREQUENCY: 1-2x/week  PT DURATION: 12 weeks  PLANNED INTERVENTIONS: Therapeutic exercises, Therapeutic activity, Neuromuscular re-education, Balance training, Gait training, Patient/Family education, Self Care, Joint mobilization, Electrical stimulation, Spinal mobilization, Cryotherapy, Moist heat, Traction, Ultrasound, and Manual therapy.  PLAN FOR NEXT SESSION: continue to work with her on the foot and the back   ALAlcoa IncPT 12/09/2021, 11:01 AM

## 2021-12-16 ENCOUNTER — Ambulatory Visit (INDEPENDENT_AMBULATORY_CARE_PROVIDER_SITE_OTHER): Payer: Medicare PPO

## 2021-12-16 VITALS — Ht 62.0 in | Wt 110.0 lb

## 2021-12-16 DIAGNOSIS — Z0000 Encounter for general adult medical examination without abnormal findings: Secondary | ICD-10-CM | POA: Diagnosis not present

## 2021-12-16 DIAGNOSIS — H6121 Impacted cerumen, right ear: Secondary | ICD-10-CM | POA: Diagnosis not present

## 2021-12-16 DIAGNOSIS — H9011 Conductive hearing loss, unilateral, right ear, with unrestricted hearing on the contralateral side: Secondary | ICD-10-CM | POA: Diagnosis not present

## 2021-12-16 NOTE — Progress Notes (Signed)
I connected with Sopheap Basic today by telephone and verified that I am speaking with the correct person using two identifiers. Location patient: home Location provider: work Persons participating in the virtual visit: Orilla Templeman, Glenna Durand LPN.   I discussed the limitations, risks, security and privacy concerns of performing an evaluation and management service by telephone and the availability of in person appointments. I also discussed with the patient that there may be a patient responsible charge related to this service. The patient expressed understanding and verbally consented to this telephonic visit.    Interactive audio and video telecommunications were attempted between this provider and patient, however failed, due to patient having technical difficulties OR patient did not have access to video capability.  We continued and completed visit with audio only.     Vital signs may be patient reported or missing.  Subjective:   KAISEY Rivera is a 71 y.o. female who presents for Medicare Annual (Subsequent) preventive examination.  Review of Systems     Cardiac Risk Factors include: advanced age (>16mn, >>72women)     Objective:    Today's Vitals   12/16/21 1456  Weight: 110 lb (49.9 kg)  Height: '5\' 2"'$  (1.575 m)   Body mass index is 20.12 kg/m.     12/16/2021    3:01 PM 10/08/2020    1:00 PM 11/14/2019    9:48 AM 10/05/2019   11:28 AM 02/22/2018    1:37 PM 11/18/2015    2:37 PM 05/31/2014   10:15 AM  Advanced Directives  Does Patient Have a Medical Advance Directive? Yes Yes No Yes No No No  Type of [place]aramed[place]of [place] will Healthcare Power of [place] will     Copy of [place] Chart? No - copy requested No - copy requested  No - copy requested     Would patient like information on creating a medical advance directive?   No - Patient declined    No - patient declined  information    Current Medications (verified) Outpatient Encounter Medications as of 12/16/2021  Medication Sig   Ascorbic Acid (VITAMIN C) 1000 MG tablet SMARTSIG:1 By Mouth   atenolol (TENORMIN) 25 MG tablet Take 0.5 tablets (12.5 mg total) by mouth daily as needed (for palpitations).   calcium-vitamin D 250-100 MG-UNIT per tablet Take 1 tablet by mouth daily.   clobetasol ointment (TEMOVATE) 0.05 % clobetasol 0.05 % topical ointment  APPLY TO VULVA TWICE WEEKLY   Doxylamine Succinate, Sleep, (SLEEP-AID PO) Take by mouth. [facility] sleep aid   estradiol (ESTRACE) 0.5 MG tablet Take 0.5 mg by mouth daily.   hydrocortisone (ANUSOL-HC) 2.5 % rectal cream Place 1 Application rectally 2 (two) times daily.   L-LYSINE PO Take 1 tablet by mouth 2 (two) times a week.   meloxicam (MOBIC) 15 MG tablet TAKE 1 TABLET BY MOUTH AS NEEDED   Multiple Vitamin (MULTIVITAMIN) tablet Take 1 tablet by mouth daily.   Phenyleph-Diphenhyd-Hydrocod (HYDRO-DP PO) Take by mouth. Hydroeye   RESTASIS 0.05 % ophthalmic emulsion    SAMBUCOL BLACK ELDERBERRY PO Take 1 capsule by mouth 2 (two) times a week.   zolmitriptan (ZOMIG-ZMT) 2.5 MG disintegrating tablet TAKE 1 TABLET BY MOUTH AS NEEDED FOR MIGRAINE   meclizine (ANTIVERT) 12.5 MG tablet meclizine 12.5 mg tablet  one tablet every 6-12 hours as needed for dizziness (Patient not taking: Reported on 10/06/2021)   omeprazole (PRILOSEC) 20 MG capsule Take  1 capsule (20 mg total) by mouth daily. AS NEEDED (Patient not taking: Reported on 10/06/2021)   No facility-administered encounter medications on file as of 12/16/2021.    Allergies (verified) Codeine, Compazine, Gabapentin, Ondansetron hcl, Other, Prednisone, Prochlorperazine edisylate, Sulfa antibiotics, and Sulfonamide derivatives   History: Past Medical History:  Diagnosis Date   Anal fissure    Anemia    during pregnancy   Anxiety    Basal cell carcinoma    DDD (degenerative disc disease)    Diverticulosis     Hypokalemia    LBP (low back pain)    Melanoma (HCC)    Migraine    MVP (mitral valve prolapse) 1990   psa   Rosacea    Ulcer    as a child   Past Surgical History:  Procedure Laterality Date   ABDOMINAL HYSTERECTOMY  1985   BREAST BIOPSY Left    secondary to infection   I & D EXTREMITY Right 09/09/2012   Procedure: Minor IRRIGATION AND DEBRIDEMENT EXTREMITY paronychia of right thumb;  Surgeon: Wynonia Sours, MD;  Location: [facility];  Service: Orthopedics;  Laterality: Right;   I & D EXTREMITY Left 01/26/2014   Procedure: MINOR IINCISION AND DRAINAGE paronychia left index finger;  Surgeon: Daryll Brod, MD;  Location: [place];  Service: Orthopedics;  Laterality: Left;  left index   OOPHORECTOMY Left 1994   REPAIR KNEE LIGAMENT Left 1989   TONSILLECTOMY  1955   TUBAL LIGATION     VEIN SURGERY     Family History  Problem Relation Age of Onset   Heart disease Mother    Colon polyps Mother    Heart disease Father    Colitis Father    Colon polyps Father    Esophageal cancer Maternal Aunt    Colon cancer Neg Hx    Social History   Socioeconomic History   Marital status: Married    Spouse name: Not on file   Number of children: 2   Years of education: Not on file   Highest education level: Not on file  Occupational History   Occupation: retired school system  Tobacco Use   Smoking status: Never   Smokeless tobacco: Never  Vaping Use   Vaping Use: Never used  Substance and Sexual Activity   Alcohol use: Yes    Alcohol/week: 1.0 standard drink of alcohol    Types: 1 Standard drinks or equivalent per week    Comment: social wine   Drug use: No   Sexual activity: Yes  Other Topics Concern   Not on file  Social History Narrative   Not on file   Social Determinants of Health   Financial Resource Strain: Low Risk  (12/16/2021)   Overall Financial Resource Strain (CARDIA)    Difficulty of Paying Living Expenses: Not hard at all   Food Insecurity: No Food Insecurity (12/16/2021)   Hunger Vital Sign    Worried About Running Out of Food in the Last Year: Never true    Ran Out of Food in the Last Year: Never true  Transportation Needs: No Transportation Needs (12/16/2021)   PRAPARE - Hydrologist (Medical): No    Lack of Transportation (Non-Medical): No  Physical Activity: Inactive (12/16/2021)   Exercise Vital Sign    Days of Exercise per Week: 0 days    Minutes of Exercise per Session: 0 min  Stress: No Stress Concern Present (12/16/2021)   [employer]  of [place]    Feeling of Stress : Not at all  Social Connections: Socially Integrated (10/08/2020)   Social Connection and Isolation Panel [NHANES]    Frequency of Communication with Friends and Family: More than three times a week    Frequency of Social Gatherings with Friends and Family: More than three times a week    Attends Religious Services: More than 4 times per year    Active Member of [employer] or Organizations: Yes    Attends Archivist Meetings: 1 to 4 times per year    Marital Status: Married    Tobacco Counseling Counseling given: Not Answered   Clinical Intake:  Pre-visit preparation completed: Yes  Pain : No/denies pain     Nutritional Status: BMI of 19-24  Normal Nutritional Risks: None Diabetes: No  How often do you need to have someone help you when you read instructions, pamphlets, or other written materials from your doctor or pharmacy?: 1 - Never  Diabetic? no  Interpreter Needed?: No  Information entered by :: NAllen LPN   Activities of Daily Living    12/16/2021    3:02 PM  In your present state of health, do you have any difficulty performing the following activities:  Hearing? 0  Comment ear blocked up  Vision? 0  Difficulty concentrating or making decisions? 0  Walking or climbing stairs? 0  Dressing or bathing? 0  Doing  errands, shopping? 0  Preparing Food and eating ? N  Using the Toilet? N  In the past six months, have you accidently leaked urine? Y  Comment with hard sneeze  Do you have problems with loss of bowel control? N  Managing your Medications? N  Managing your Finances? N  Housekeeping or managing your Housekeeping? N    Patient Care Team: Libby Maw, MD as PCP - General (Family Medicine) Croitoru, Dani Gobble, MD as PCP - Cardiology (Cardiology)  Indicate any recent Medical Services you may have received from other than [facility] providers in the past year (date may be approximate).     Assessment:   This is a routine wellness examination for Liisa.  Hearing/Vision screen Vision Screening - Comments:: Regular eye exams, [place]  Dietary issues and exercise activities discussed: Current Exercise Habits: The patient does not participate in regular exercise at present   Goals Addressed             This Visit's Progress    Patient Stated       12/16/2021, wants body to look better       Depression Screen    12/16/2021    3:02 PM 09/08/2021    4:14 PM 05/06/2021    9:23 AM 05/06/2021    8:57 AM 10/08/2020   12:59 PM 07/30/2020   11:26 AM 06/07/2020   10:38 AM  PHQ 2/9 Scores  PHQ - 2 Score 0 0 2 1 0 0 0  PHQ- 9 Score   5        Fall Risk    12/16/2021    3:02 PM 09/08/2021    4:14 PM 05/06/2021    8:57 AM 03/03/2021    2:29 PM 10/08/2020   12:35 PM  Fall Risk   Falls in the past year? 0 0 1 0 1  Number falls in past yr: 0 0 0 0 0  Injury with Fall? 0  0 0 1  Risk for fall due to : Medication side effect  Follow up Falls prevention discussed;Education provided;Falls evaluation completed    Falls evaluation completed;Falls prevention discussed    FALL RISK PREVENTION PERTAINING TO THE HOME:  Any stairs in or around the home? Yes  If so, are there any without handrails? No  Home free of loose throw rugs in walkways, pet beds, electrical cords, etc? Yes   Adequate lighting in your home to reduce risk of falls? Yes   ASSISTIVE DEVICES UTILIZED TO PREVENT FALLS:  Life alert? No  Use of a cane, walker or w/c? No  Grab bars in the bathroom? Yes  Shower chair or bench in shower? No  Elevated toilet seat or a handicapped toilet? Yes   TIMED UP AND GO:  Was the test performed? No .      Cognitive Function:        12/16/2021    3:03 PM  6CIT Screen  What Year? 0 points  What month? 0 points  What time? 0 points  Count back from 20 0 points  Months in reverse 0 points  Repeat phrase 0 points  Total Score 0 points    Immunizations Immunization History  Administered Date(s) Administered   Fluad Quad(high Dose 65+) 12/10/2020   Influenza Inj Mdck Quad Pf 11/23/2012, 11/22/2013   Influenza Split 12/31/2010, 11/23/2012   Influenza Whole 11/11/2006, 11/14/2007, 11/30/2008, 11/26/2021   Influenza, High Dose Seasonal PF 12/06/2014, 11/18/2015, 11/16/2016, 11/24/2018   Influenza,inj,Quad PF,6+ Mos 11/22/2017   Influenza-Unspecified 11/23/2012, 11/22/2013, 11/22/2017, 11/24/2018, 01/23/2020   PFIZER(Purple Top)SARS-COV-2 Vaccination 03/03/2019, 03/24/2019, 03/14/2020   Pneumococcal Conjugate-13 12/28/2013   Pneumococcal Polysaccharide-23 01/08/2015   Td 02/09/2001   Tdap 12/22/2011   Zoster, Live 07/14/2011    TDAP status: Up to date  Flu Vaccine status: Up to date  Pneumococcal vaccine status: Up to date  Covid-19 vaccine status: Completed vaccines  Qualifies for Shingles Vaccine? Yes   Zostavax completed Yes   Shingrix Completed?: No.    Education has been provided regarding the importance of this vaccine. Patient has been advised to call insurance company to determine out of pocket expense if they have not yet received this vaccine. Advised may also receive vaccine at local pharmacy or Health Dept. Verbalized acceptance and understanding.  Screening Tests Health Maintenance  Topic Date Due   Zoster Vaccines- Shingrix  (1 of 2) Never done   Medicare Annual Wellness (AWV)  10/08/2021   TETANUS/TDAP  12/21/2021   MAMMOGRAM  05/13/2023   COLONOSCOPY (Pts 45-52yr Insurance coverage will need to be confirmed)  09/26/2024   Pneumonia Vaccine 71 Years old  Completed   INFLUENZA VACCINE  Completed   DEXA SCAN  Completed   Hepatitis C Screening  Completed   HPV VACCINES  Aged Out   COVID-19 Vaccine  Discontinued    Health Maintenance  Health Maintenance Due  Topic Date Due   Zoster Vaccines- Shingrix (1 of 2) Never done   Medicare Annual Wellness (AWV)  10/08/2021    Colorectal cancer screening: Type of screening: Colonoscopy. Completed 09/27/2019. Repeat every 5 years  Mammogram status: Completed 05/12/2021. Repeat every year  Bone Density status: Completed 12/19/2019.   Lung Cancer Screening: (Low Dose CT Chest recommended if Age 73-80years, 30 pack-year currently smoking OR have quit w/in 15years.) does not qualify.   Lung Cancer Screening Referral: no  Additional Screening:  Hepatitis C Screening: does qualify; Completed 12/11/2014  Vision Screening: Recommended annual ophthalmology exams for early detection of glaucoma and other disorders of the eye. Is the patient  up to date with their annual eye exam?  Yes  Who is the provider or what is the name of the office in which the patient attends annual eye exams? Dr. Bing Plume  If pt is not established with a provider, would they like to be referred to a provider to establish care? No .   Dental Screening: Recommended annual dental exams for proper oral hygiene  Community Resource Referral / Chronic Care Management: CRR required this visit?  No   CCM required this visit?  No      Plan:     I have personally reviewed and noted the following in the patient's chart:   Medical and social history Use of alcohol, tobacco or illicit drugs  Current medications and supplements including opioid prescriptions. Patient is not currently taking opioid  prescriptions. Functional ability and status Nutritional status Physical activity Advanced directives List of other physicians Hospitalizations, surgeries, and ER visits in previous 12 months Vitals Screenings to include cognitive, depression, and falls Referrals and appointments  In addition, I have reviewed and discussed with patient certain preventive protocols, quality metrics, and best practice recommendations. A written personalized care plan for preventive services as well as general preventive health recommendations were provided to patient.     Kellie Simmering, LPN   50/10/3265   Nurse Notes: none  Due to this being a virtual visit, the after visit summary with patients personalized plan was offered to patient via mail or my-chart.  Patient would like to access on my-chart

## 2021-12-16 NOTE — Patient Instructions (Signed)
[name] , Thank you for taking time to come for your Medicare Wellness Visit. I appreciate your ongoing commitment to your health goals. Please review the following plan we discussed and let me know if I can assist you in the future.   Screening recommendations/referrals: Colonoscopy: completed 09/27/2019, due 09/26/2024 Mammogram: completed 05/12/2021, due 05/14/2022 Bone Density: completed 12/19/2019 Recommended yearly ophthalmology/optometry visit for glaucoma screening and checkup Recommended yearly dental visit for hygiene and checkup  Vaccinations: Influenza vaccine: completed 11/26/2021 Pneumococcal vaccine: completed 01/08/2015 Tdap vaccine: completed 12/22/2011, due 12/21/2021 Shingles vaccine: discussed   Covid-19: 03/14/2020, 03/24/2019, 03/03/2019  Advanced directives: Please bring a copy of your POA (Power of Attorney) and/or Living Will to your next appointment.   Conditions/risks identified: none  Next appointment: Follow up in one year for your annual wellness visit    Preventive Care 65 Years and Older, Female Preventive care refers to lifestyle choices and visits with your health care provider that can promote health and wellness. What does preventive care include? A yearly physical exam. This is also called an annual well check. Dental exams once or twice a year. Routine eye exams. Ask your health care provider how often you should have your eyes checked. Personal lifestyle choices, including: Daily care of your teeth and gums. Regular physical activity. Eating a healthy diet. Avoiding tobacco and drug use. Limiting alcohol use. Practicing safe sex. Taking low-dose aspirin every day. Taking vitamin and mineral supplements as recommended by your health care provider. What happens during an annual well check? The services and screenings done by your health care provider during your annual well check will depend on your age, overall health, lifestyle risk factors, and  family history of disease. Counseling  Your health care provider may ask you questions about your: Alcohol use. Tobacco use. Drug use. Emotional well-being. Home and relationship well-being. Sexual activity. Eating habits. History of falls. Memory and ability to understand (cognition). Work and work Statistician. Reproductive health. Screening  You may have the following tests or measurements: Height, weight, and BMI. Blood pressure. Lipid and cholesterol levels. These may be checked every 5 years, or more frequently if you are over 31 years old. Skin check. Lung cancer screening. You may have this screening every year starting at age 39 if you have a 30-pack-year history of smoking and currently smoke or have quit within the past 15 years. Fecal occult blood test (FOBT) of the stool. You may have this test every year starting at age 68. Flexible sigmoidoscopy or colonoscopy. You may have a sigmoidoscopy every 5 years or a colonoscopy every 10 years starting at age 18. Hepatitis C blood test. Hepatitis B blood test. Sexually transmitted disease (STD) testing. Diabetes screening. This is done by checking your blood sugar (glucose) after you have not eaten for a while (fasting). You may have this done every 1-3 years. Bone density scan. This is done to screen for osteoporosis. You may have this done starting at age 67. Mammogram. This may be done every 1-2 years. Talk to your health care provider about how often you should have regular mammograms. Talk with your health care provider about your test results, treatment options, and if necessary, the need for more tests. Vaccines  Your health care provider may recommend certain vaccines, such as: Influenza vaccine. This is recommended every year. Tetanus, diphtheria, and acellular pertussis (Tdap, Td) vaccine. You may need a Td booster every 10 years. Zoster vaccine. You may need this after age 33. Pneumococcal 13-valent conjugate  (  PCV13) vaccine. One dose is recommended after age 63. Pneumococcal polysaccharide (PPSV23) vaccine. One dose is recommended after age 97. Talk to your health care provider about which screenings and vaccines you need and how often you need them. This information is not intended to replace advice given to you by your health care provider. Make sure you discuss any questions you have with your health care provider. Document Released: 02/22/2015 Document Revised: 10/16/2015 Document Reviewed: 11/27/2014 Elsevier Interactive Patient Education  2017 [place] Prevention in the Home Falls can cause injuries. They can happen to people of all ages. There are many things you can do to make your home safe and to help prevent falls. What can I do on the outside of my home? Regularly fix the edges of walkways and driveways and fix any cracks. Remove anything that might make you trip as you walk through a door, such as a raised step or threshold. Trim any bushes or trees on the path to your home. Use bright outdoor lighting. Clear any walking paths of anything that might make someone trip, such as rocks or tools. Regularly check to see if handrails are loose or broken. Make sure that both sides of any steps have handrails. Any raised decks and porches should have guardrails on the edges. Have any leaves, snow, or ice cleared regularly. Use sand or salt on walking paths during winter. Clean up any spills in your garage right away. This includes oil or grease spills. What can I do in the bathroom? Use night lights. Install grab bars by the toilet and in the tub and shower. Do not use towel bars as grab bars. Use non-skid mats or decals in the tub or shower. If you need to sit down in the shower, use a plastic, non-slip stool. Keep the floor dry. Clean up any water that spills on the floor as soon as it happens. Remove soap buildup in the tub or shower regularly. Attach bath mats securely with  double-sided non-slip rug tape. Do not have throw rugs and other things on the floor that can make you trip. What can I do in the bedroom? Use night lights. Make sure that you have a light by your bed that is easy to reach. Do not use any sheets or blankets that are too big for your bed. They should not hang down onto the floor. Have a firm chair that has side arms. You can use this for support while you get dressed. Do not have throw rugs and other things on the floor that can make you trip. What can I do in the kitchen? Clean up any spills right away. Avoid walking on wet floors. Keep items that you use a lot in easy-to-reach places. If you need to reach something above you, use a strong step stool that has a grab bar. Keep electrical cords out of the way. Do not use floor polish or wax that makes floors slippery. If you must use wax, use non-skid floor wax. Do not have throw rugs and other things on the floor that can make you trip. What can I do with my stairs? Do not leave any items on the stairs. Make sure that there are handrails on both sides of the stairs and use them. Fix handrails that are broken or loose. Make sure that handrails are as long as the stairways. Check any carpeting to make sure that it is firmly attached to the stairs. Fix any carpet that is loose  or worn. Avoid having throw rugs at the top or bottom of the stairs. If you do have throw rugs, attach them to the floor with carpet tape. Make sure that you have a light switch at the top of the stairs and the bottom of the stairs. If you do not have them, ask someone to add them for you. What else can I do to help prevent falls? Wear shoes that: Do not have high heels. Have rubber bottoms. Are comfortable and fit you well. Are closed at the toe. Do not wear sandals. If you use a stepladder: Make sure that it is fully opened. Do not climb a closed stepladder. Make sure that both sides of the stepladder are locked  into place. Ask someone to hold it for you, if possible. Clearly mark and make sure that you can see: Any grab bars or handrails. First and last steps. Where the edge of each step is. Use tools that help you move around (mobility aids) if they are needed. These include: Canes. Walkers. Scooters. Crutches. Turn on the lights when you go into a dark area. Replace any light bulbs as soon as they burn out. Set up your furniture so you have a clear path. Avoid moving your furniture around. If any of your floors are uneven, fix them. If there are any pets around you, be aware of where they are. Review your medicines with your doctor. Some medicines can make you feel dizzy. This can increase your chance of falling. Ask your doctor what other things that you can do to help prevent falls. This information is not intended to replace advice given to you by your health care provider. Make sure you discuss any questions you have with your health care provider. Document Released: 11/22/2008 Document Revised: 07/04/2015 Document Reviewed: 03/02/2014 Elsevier Interactive Patient Education  2017 [employer].

## 2021-12-17 ENCOUNTER — Encounter: Payer: Self-pay | Admitting: Physical Therapy

## 2021-12-17 ENCOUNTER — Ambulatory Visit: Payer: Medicare PPO | Attending: Orthopedic Surgery | Admitting: Physical Therapy

## 2021-12-17 DIAGNOSIS — M79672 Pain in left foot: Secondary | ICD-10-CM | POA: Insufficient documentation

## 2021-12-17 DIAGNOSIS — R262 Difficulty in walking, not elsewhere classified: Secondary | ICD-10-CM | POA: Insufficient documentation

## 2021-12-17 DIAGNOSIS — M5459 Other low back pain: Secondary | ICD-10-CM | POA: Insufficient documentation

## 2021-12-17 DIAGNOSIS — M542 Cervicalgia: Secondary | ICD-10-CM | POA: Insufficient documentation

## 2021-12-17 DIAGNOSIS — M25675 Stiffness of left foot, not elsewhere classified: Secondary | ICD-10-CM | POA: Diagnosis not present

## 2021-12-17 DIAGNOSIS — M62830 Muscle spasm of back: Secondary | ICD-10-CM | POA: Diagnosis not present

## 2021-12-17 DIAGNOSIS — R252 Cramp and spasm: Secondary | ICD-10-CM | POA: Diagnosis not present

## 2021-12-17 NOTE — Therapy (Signed)
OUTPATIENT PHYSICAL THERAPY THORACOLUMBAR TREATMENT   Patient Name: [name] MRN: [id number] DOB:[birth year], 71 y.o., female Today's Date: 12/17/2021   PT End of Session - 12/17/21 1401     Visit Number 4    Number of Visits 13    Date for PT Re-Evaluation 02/08/22    Authorization Type [employer]    PT Start Time 1357    PT Stop Time 1440    PT Time Calculation (min) 43 min    Activity Tolerance Patient tolerated treatment well    Behavior During Therapy WFL for tasks assessed/performed             Past Medical History:  Diagnosis Date   Anal fissure    Anemia    during pregnancy   Anxiety    Basal cell carcinoma    DDD (degenerative disc disease)    Diverticulosis    Hypokalemia    LBP (low back pain)    Melanoma (HCC)    Migraine    MVP (mitral valve prolapse) 1990   psa   Rosacea    Ulcer    as a child   Past Surgical History:  Procedure Laterality Date   ABDOMINAL HYSTERECTOMY  1985   BREAST BIOPSY Left    secondary to infection   I & D EXTREMITY Right 09/09/2012   Procedure: Minor IRRIGATION AND DEBRIDEMENT EXTREMITY paronychia of right thumb;  Surgeon: Wynonia Sours, MD;  Location: [facility];  Service: Orthopedics;  Laterality: Right;   I & D EXTREMITY Left 01/26/2014   Procedure: MINOR IINCISION AND DRAINAGE paronychia left index finger;  Surgeon: Daryll Brod, MD;  Location: [facility];  Service: Orthopedics;  Laterality: Left;  left index   OOPHORECTOMY Left 1994   REPAIR KNEE LIGAMENT Left [place]   TUBAL LIGATION     VEIN SURGERY     Patient Active Problem List   Diagnosis Date Noted   Cellulitis of finger of right hand 09/08/2021   Infection of skin 05/13/2021   Cervicalgia 05/06/2021   Grieving 05/06/2021   Conductive hearing loss of right ear 03/04/2021   Impacted cerumen of right ear 03/04/2021   Medication side effect 03/03/2021   Excessive cerumen in right ear canal 03/03/2021    Labyrinthitis 07/30/2020   Palpitations 04/15/2020   Plantar fasciitis 04/15/2020   Dysthymia 04/15/2020   Vitamin D deficiency 04/15/2020   Gastroesophageal reflux disease 04/15/2020   Insect bite of left foot 11/24/2019   Pustule 11/24/2019   Right hand pain 03/09/2019   External hemorrhoids 03/03/2018   Other chest pain 02/23/2018   Insomnia 02/24/2017   Healthcare maintenance 02/24/2017   Conjunctivitis 03/01/2014   Seborrheic keratosis, inflamed 01/05/2012   Menopausal syndrome 12/22/2011   Encounter for counseling 11/11/2011   Skin cancer 07/14/2011   Basal cell carcinoma of face 03/10/2011   Migraine 05/14/2007   IRRITABLE BOWEL SYNDROME 05/14/2007   History of mitral valve prolapse 05/14/2007   Allergic rhinitis 04/07/2007   DERMATITIS DUE TO SOLAR RADIATION 04/07/2007   Premature beat 09/20/2006   DIVERTICULOSIS, COLON 04/13/2002    PCP: Ethelene Hal  REFERRING PROVIDER: Marquette Saa DIAG:  LBP with peroneal tendonitis, lumbar radiculopathy  Rationale for Evaluation and Treatment Rehabilitation  THERAPY DIAG:  Other low back pain  Muscle spasm of back  Pain in left foot  Difficulty in walking, not elsewhere classified  Cramp and spasm  Stiffness of left foot, not elsewhere classified  ONSET DATE: August 2023  SUBJECTIVE:                                                                                                                                                                                           SUBJECTIVE STATEMENT: Patient reports that she was doing Christmas decorations and stepped "wrong on the foot" an it really hurts PERTINENT HISTORY:  See above  PAIN:  Are you having pain? Yes: NPRS scale: 7/10 Pain location: left dorsum of the foot along the 5th ray and then the top of the foot, has pain at times in the lumbar area and into the SI Pain description: sharp in the foot, dull in the back Aggravating factors: walking, at times just a  sharp pain Relieving factors: medication pain can be down to a 3/10   PRECAUTIONS: None  WEIGHT BEARING RESTRICTIONS No  FALLS:  Has patient fallen in last 6 months? No  LIVING ENVIRONMENT: Lives with: lives with their family Lives in: House/apartment Stairs: Yes: Internal: 12 steps; can reach both Has following equipment at home: None  OCCUPATION: retired  PLOF: Independent and walks some for exercise, golf a few times a year  PATIENT GOALS have less pain, walk without issue   OBJECTIVE:   DIAGNOSTIC FINDINGS:  X-rays negative  PATIENT SURVEYS:  ODI 52%  COGNITION:  Overall cognitive status: Within functional limits for tasks assessed     SENSATION: WFL  MUSCLE LENGTH: Very tight in the HS, calves and piriformis mms  POSTURE:  good posture  PALPATION: Tender in the SI and buttocks, spasms in the lumbar area, very tender dorsal and lateral left foot  LUMBAR ROM:   Active  A/PROM  eval  Flexion Decreased 25%  Extension Decreased 50%  Right lateral flexion Decreased 25%  Left lateral flexion Decreased 25%  Right rotation   Left rotation    (Blank rows = not tested)  LOWER EXTREMITY ROM:     Active  Right eval Left eval  Hip flexion    Hip extension    Hip abduction    Hip adduction    Hip internal rotation    Hip external rotation    Knee flexion    Knee extension    Ankle dorsiflexion  0  Ankle plantarflexion  45  Ankle inversion  10  Ankle eversion  5   (Blank rows = not tested)  LOWER EXTREMITY MMT:    MMT Right eval Left eval  Hip flexion 4- 4-  Hip extension    Hip abduction 4- 4-  Hip adduction    Hip internal rotation    Hip external  rotation    Knee flexion    Knee extension    Ankle dorsiflexion 4+ 4-  Ankle plantarflexion    Ankle inversion    Ankle eversion 4+ 4- P!   (Blank rows = not tested)  GAIT: Distance walked: 100 feet Assistive device utilized: None Level of assistance: Complete Independence Comments:  antalgic on the left     TODAY'S TREATMENT  12/17/21 Nustep level 5 x 6 minutes Walk one lap outside this caused pain in the foot Seated row 20# 2x10 Lats 20# 2x10 10# AR press very difficulty with this 20# triceps 5# bicepsSTM to the dorsum of the foot/PROM Ionto to dorsum of the foot  12/09/21 Nustep level 5 x 6 minutes Towel scrunches Calf stretches Red tband all ankle motions STM to the dorsum of the foot, toe stretches Joint mobs to the mid foot and metatarsals Ionto to the dorsum of the left foot 25m dose with dex   12/02/21 Nustep level 5 x 6 minutes Calf stretches Rows 15# 2x10 Lats 15# 2x10 Seated ankle motions on the dynadisc Red tband ankle all motions 2x10 Feet on ball K2C, trunk rotation, small bridge, isometric abs Ionto dorsum of the left foot, 814mdose dex    PATIENT EDUCATION:  Education details: Calf and piriformis stretch for HEP Person educated: Patient Education method: ExConsulting civil engineerDemonstration, Tactile cues, and Verbal cues Education comprehension: verbalized understanding and returned demonstration   HOME EXERCISE PROGRAM: Calf and piriformis stretches  ASSESSMENT:  CLINICAL IMPRESSION: Patient reports pain in the dorsum of the foot, she is reporting pain up to 7-8/10 with walking, she did have some back tightness with the AR press.  Reports very tender in the foot  OBJECTIVE IMPAIRMENTS Abnormal gait, cardiopulmonary status limiting activity, decreased activity tolerance, decreased balance, decreased endurance, decreased mobility, difficulty walking, decreased ROM, decreased strength, increased muscle spasms, impaired flexibility, improper body mechanics, postural dysfunction, and pain.   REHAB POTENTIAL: Good  CLINICAL DECISION MAKING: Stable/uncomplicated  EVALUATION COMPLEXITY: Low   GOALS: Goals reviewed with patient? Yes  SHORT TERM GOALS: Target date: 12/01/21  Independent with initial HEP Goal status: ongoing  LONG  TERM GOALS: Target date: 03/04/22  Independent with advanced HEP Goal status: INITIAL  2.  Understand posture and body mechanics Goal status: ongoing  3.  Decrease pain 50% Goal status:ongoing  4.  Walk without difficulty Goal status: ongoing  5.  Increase left ankle DF to 10 degrees Goal status: INITIAL  6.  Increase lumbar ROM to WNL's Goal status: INITIAL   PLAN: PT FREQUENCY: 1-2x/week  PT DURATION: 12 weeks  PLANNED INTERVENTIONS: Therapeutic exercises, Therapeutic activity, Neuromuscular re-education, Balance training, Gait training, Patient/Family education, Self Care, Joint mobilization, Electrical stimulation, Spinal mobilization, Cryotherapy, Moist heat, Traction, Ultrasound, and Manual therapy.  PLAN FOR NEXT SESSION: continue to work with her on the foot and the back   ALAlcoa IncPT 12/17/2021, 2:02 PM

## 2021-12-22 ENCOUNTER — Ambulatory Visit: Payer: Medicare PPO | Admitting: Physical Therapy

## 2021-12-22 ENCOUNTER — Encounter: Payer: Self-pay | Admitting: Physical Therapy

## 2021-12-22 DIAGNOSIS — M5459 Other low back pain: Secondary | ICD-10-CM

## 2021-12-22 DIAGNOSIS — M79672 Pain in left foot: Secondary | ICD-10-CM

## 2021-12-22 DIAGNOSIS — M25675 Stiffness of left foot, not elsewhere classified: Secondary | ICD-10-CM | POA: Diagnosis not present

## 2021-12-22 DIAGNOSIS — R262 Difficulty in walking, not elsewhere classified: Secondary | ICD-10-CM

## 2021-12-22 DIAGNOSIS — M542 Cervicalgia: Secondary | ICD-10-CM

## 2021-12-22 DIAGNOSIS — M62830 Muscle spasm of back: Secondary | ICD-10-CM | POA: Diagnosis not present

## 2021-12-22 DIAGNOSIS — R252 Cramp and spasm: Secondary | ICD-10-CM

## 2021-12-22 NOTE — Therapy (Signed)
OUTPATIENT PHYSICAL THERAPY THORACOLUMBAR TREATMENT   Patient Name: [name] MRN: [id number] DOB:04/24/1950, 71 y.o., female Today's Date: 12/22/2021   PT End of Session - 12/22/21 1052     Visit Number 5    Number of Visits 13    Date for PT Re-Evaluation 02/08/22    Authorization Type [employer]    Authorization Time Period 4/12    PT Start Time 1052    PT Stop Time 1140    PT Time Calculation (min) 48 min    Activity Tolerance Patient tolerated treatment well    Behavior During Therapy WFL for tasks assessed/performed             Past Medical History:  Diagnosis Date   Anal fissure    Anemia    during pregnancy   Anxiety    Basal cell carcinoma    DDD (degenerative disc disease)    Diverticulosis    Hypokalemia    LBP (low back pain)    Melanoma (HCC)    Migraine    MVP (mitral valve prolapse) 1990   psa   Rosacea    Ulcer    as a child   Past Surgical History:  Procedure Laterality Date   ABDOMINAL HYSTERECTOMY  1985   BREAST BIOPSY Left    secondary to infection   I & D EXTREMITY Right 09/09/2012   Procedure: Minor IRRIGATION AND DEBRIDEMENT EXTREMITY paronychia of right thumb;  Surgeon: Wynonia Sours, MD;  Location: [facility];  Service: Orthopedics;  Laterality: Right;   I & D EXTREMITY Left 01/26/2014   Procedure: MINOR IINCISION AND DRAINAGE paronychia left index finger;  Surgeon: Daryll Brod, MD;  Location: [facility];  Service: Orthopedics;  Laterality: Left;  left index   OOPHORECTOMY Left 1994   REPAIR KNEE LIGAMENT Left [place]   TUBAL LIGATION     VEIN SURGERY     Patient Active Problem List   Diagnosis Date Noted   Cellulitis of finger of right hand 09/08/2021   Infection of skin 05/13/2021   Cervicalgia 05/06/2021   Grieving 05/06/2021   Conductive hearing loss of right ear 03/04/2021   Impacted cerumen of right ear 03/04/2021   Medication side effect 03/03/2021   Excessive cerumen in  right ear canal 03/03/2021   Labyrinthitis 07/30/2020   Palpitations 04/15/2020   Plantar fasciitis 04/15/2020   Dysthymia 04/15/2020   Vitamin D deficiency 04/15/2020   Gastroesophageal reflux disease 04/15/2020   Insect bite of left foot 11/24/2019   Pustule 11/24/2019   Right hand pain 03/09/2019   External hemorrhoids 03/03/2018   Other chest pain 02/23/2018   Insomnia 02/24/2017   Healthcare maintenance 02/24/2017   Conjunctivitis 03/01/2014   Seborrheic keratosis, inflamed 01/05/2012   Menopausal syndrome 12/22/2011   Encounter for counseling 11/11/2011   Skin cancer 07/14/2011   Basal cell carcinoma of face 03/10/2011   Migraine 05/14/2007   IRRITABLE BOWEL SYNDROME 05/14/2007   History of mitral valve prolapse 05/14/2007   Allergic rhinitis 04/07/2007   DERMATITIS DUE TO SOLAR RADIATION 04/07/2007   Premature beat 09/20/2006   DIVERTICULOSIS, COLON 04/13/2002    PCP: Ethelene Hal  REFERRING PROVIDER: Marquette Saa DIAG:  LBP with peroneal tendonitis, lumbar radiculopathy  Rationale for Evaluation and Treatment Rehabilitation  THERAPY DIAG:  Other low back pain  Muscle spasm of back  Pain in left foot  Difficulty in walking, not elsewhere classified  Cramp and spasm  Stiffness  of left foot, not elsewhere classified  Cervicalgia  ONSET DATE: August 2023  SUBJECTIVE:                                                                                                                                                                                           SUBJECTIVE STATEMENT: Patient reports that she had to move furniture out of a room to have it painted, she reports back pain last night an 8/10, today 6/10 and left foot pain a 5/10, reports that her foot was really hurting when she got up this morning. PERTINENT HISTORY:  See above  PAIN:  Are you having pain? Yes: NPRS scale: 7/10 Pain location: left dorsum of the foot along the 5th ray and then the top  of the foot, has pain at times in the lumbar area and into the SI Pain description: sharp in the foot, dull in the back Aggravating factors: walking, at times just a sharp pain Relieving factors: medication pain can be down to a 3/10   PRECAUTIONS: None  WEIGHT BEARING RESTRICTIONS No  FALLS:  Has patient fallen in last 6 months? No  LIVING ENVIRONMENT: Lives with: lives with their family Lives in: House/apartment Stairs: Yes: Internal: 12 steps; can reach both Has following equipment at home: None  OCCUPATION: retired  PLOF: Independent and walks some for exercise, golf a few times a year  PATIENT GOALS have less pain, walk without issue   OBJECTIVE:   DIAGNOSTIC FINDINGS:  X-rays negative  PATIENT SURVEYS:  ODI 52%  COGNITION:  Overall cognitive status: Within functional limits for tasks assessed     SENSATION: WFL  MUSCLE LENGTH: Very tight in the HS, calves and piriformis mms  POSTURE:  good posture  PALPATION: Tender in the SI and buttocks, spasms in the lumbar area, very tender dorsal and lateral left foot  LUMBAR ROM:   Active  A/PROM  eval  Flexion Decreased 25%  Extension Decreased 50%  Right lateral flexion Decreased 25%  Left lateral flexion Decreased 25%  Right rotation   Left rotation    (Blank rows = not tested)  LOWER EXTREMITY ROM:     Active  Right eval Left eval  Hip flexion    Hip extension    Hip abduction    Hip adduction    Hip internal rotation    Hip external rotation    Knee flexion    Knee extension    Ankle dorsiflexion  0  Ankle plantarflexion  45  Ankle inversion  10  Ankle eversion  5   (Blank rows = not tested)  LOWER EXTREMITY MMT:  MMT Right eval Left eval  Hip flexion 4- 4-  Hip extension    Hip abduction 4- 4-  Hip adduction    Hip internal rotation    Hip external rotation    Knee flexion    Knee extension    Ankle dorsiflexion 4+ 4-  Ankle plantarflexion    Ankle inversion    Ankle  eversion 4+ 4- P!   (Blank rows = not tested)  GAIT: Distance walked: 100 feet Assistive device utilized: None Level of assistance: Complete Independence Comments: antalgic on the left     TODAY'S TREATMENT  12/22/21 UBE level 2 x 4 minues Calf stretches Bike level 3 x 5 minutes 20# seated rows 20# lats 20# triceps  5# biceps On dynadisc pelvic mobility and stability with red band 6# farmer carry Seated ball roll outs for low back streches STM to the left foot and some gentle joint mobs  12/17/21 Nustep level 5 x 6 minutes Walk one lap outside this caused pain in the foot Seated row 20# 2x10 Lats 20# 2x10 10# AR press very difficulty with this 20# triceps 5# bicepsSTM to the dorsum of the foot/PROM Ionto to dorsum of the foot  12/09/21 Nustep level 5 x 6 minutes Towel scrunches Calf stretches Red tband all ankle motions STM to the dorsum of the foot, toe stretches Joint mobs to the mid foot and metatarsals Ionto to the dorsum of the left foot 61m dose with dex   12/02/21 Nustep level 5 x 6 minutes Calf stretches Rows 15# 2x10 Lats 15# 2x10 Seated ankle motions on the dynadisc Red tband ankle all motions 2x10 Feet on ball K2C, trunk rotation, small bridge, isometric abs Ionto dorsum of the left foot, 831mdose dex    PATIENT EDUCATION:  Education details: Calf and piriformis stretch for HEP Person educated: Patient Education method: ExConsulting civil engineerDemonstration, Tactile cues, and Verbal cues Education comprehension: verbalized understanding and returned demonstration   HOME EXERCISE PROGRAM: Calf and piriformis stretches  ASSESSMENT:  CLINICAL IMPRESSION: Patient with increased back and foot pain today after being very busy over the weekend moving furniture.  She is stiff and sore in the low back and the foot, we tried to get her moving today and she reported feeling better with the motions but also has some pain with motions  OBJECTIVE IMPAIRMENTS  Abnormal gait, cardiopulmonary status limiting activity, decreased activity tolerance, decreased balance, decreased endurance, decreased mobility, difficulty walking, decreased ROM, decreased strength, increased muscle spasms, impaired flexibility, improper body mechanics, postural dysfunction, and pain.   REHAB POTENTIAL: Good  CLINICAL DECISION MAKING: Stable/uncomplicated  EVALUATION COMPLEXITY: Low   GOALS: Goals reviewed with patient? Yes  SHORT TERM GOALS: Target date: 12/01/21  Independent with initial HEP Goal status: ongoing  LONG TERM GOALS: Target date: 03/04/22  Independent with advanced HEP Goal status: INITIAL  2.  Understand posture and body mechanics Goal status: ongoing  3.  Decrease pain 50% Goal status:ongoing  4.  Walk without difficulty Goal status: ongoing  5.  Increase left ankle DF to 10 degrees Goal status: INITIAL  6.  Increase lumbar ROM to WNL's Goal status: progressing   PLAN: PT FREQUENCY: 1-2x/week  PT DURATION: 12 weeks  PLANNED INTERVENTIONS: Therapeutic exercises, Therapeutic activity, Neuromuscular re-education, Balance training, Gait training, Patient/Family education, Self Care, Joint mobilization, Electrical stimulation, Spinal mobilization, Cryotherapy, Moist heat, Traction, Ultrasound, and Manual therapy.  PLAN FOR NEXT SESSION: continue to work with her on the foot and the back  Sumner Boast, PT 12/22/2021, 10:52 AM

## 2022-01-13 ENCOUNTER — Ambulatory Visit: Payer: Medicare PPO | Attending: Orthopedic Surgery | Admitting: Physical Therapy

## 2022-01-13 ENCOUNTER — Encounter: Payer: Self-pay | Admitting: Physical Therapy

## 2022-01-13 ENCOUNTER — Encounter: Payer: Medicare PPO | Admitting: Physical Therapy

## 2022-01-13 DIAGNOSIS — M79672 Pain in left foot: Secondary | ICD-10-CM | POA: Insufficient documentation

## 2022-01-13 DIAGNOSIS — R252 Cramp and spasm: Secondary | ICD-10-CM | POA: Insufficient documentation

## 2022-01-13 DIAGNOSIS — M62830 Muscle spasm of back: Secondary | ICD-10-CM | POA: Insufficient documentation

## 2022-01-13 DIAGNOSIS — M5459 Other low back pain: Secondary | ICD-10-CM | POA: Diagnosis not present

## 2022-01-13 DIAGNOSIS — R262 Difficulty in walking, not elsewhere classified: Secondary | ICD-10-CM | POA: Insufficient documentation

## 2022-01-13 DIAGNOSIS — M25675 Stiffness of left foot, not elsewhere classified: Secondary | ICD-10-CM | POA: Insufficient documentation

## 2022-01-13 NOTE — Therapy (Signed)
OUTPATIENT PHYSICAL THERAPY THORACOLUMBAR TREATMENT   Patient Name: [name] MRN: [id number] DOB:[birth year], 71 y.o., female Today's Date: 01/13/2022   PT End of Session - 01/13/22 1614     Visit Number 6    Number of Visits 13    Date for PT Re-Evaluation 03/04/22    Authorization Type [employer]    Authorization Time Period 5/12    PT Start Time 1612    PT Stop Time 1700    PT Time Calculation (min) 48 min    Activity Tolerance Patient tolerated treatment well    Behavior During Therapy WFL for tasks assessed/performed             Past Medical History:  Diagnosis Date   Anal fissure    Anemia    during pregnancy   Anxiety    Basal cell carcinoma    DDD (degenerative disc disease)    Diverticulosis    Hypokalemia    LBP (low back pain)    Melanoma (HCC)    Migraine    MVP (mitral valve prolapse) 1990   psa   Rosacea    Ulcer    as a child   Past Surgical History:  Procedure Laterality Date   ABDOMINAL HYSTERECTOMY  1985   BREAST BIOPSY Left    secondary to infection   I & D EXTREMITY Right 09/09/2012   Procedure: Minor IRRIGATION AND DEBRIDEMENT EXTREMITY paronychia of right thumb;  Surgeon: Wynonia Sours, MD;  Location: [facility];  Service: Orthopedics;  Laterality: Right;   I & D EXTREMITY Left 01/26/2014   Procedure: MINOR IINCISION AND DRAINAGE paronychia left index finger;  Surgeon: Daryll Brod, MD;  Location: [facility];  Service: Orthopedics;  Laterality: Left;  left index   OOPHORECTOMY Left 1994   REPAIR KNEE LIGAMENT Left [place]   TUBAL LIGATION     VEIN SURGERY     Patient Active Problem List   Diagnosis Date Noted   Cellulitis of finger of right hand 09/08/2021   Infection of skin 05/13/2021   Cervicalgia 05/06/2021   Grieving 05/06/2021   Conductive hearing loss of right ear 03/04/2021   Impacted cerumen of right ear 03/04/2021   Medication side effect 03/03/2021   Excessive cerumen in  right ear canal 03/03/2021   Labyrinthitis 07/30/2020   Palpitations 04/15/2020   Plantar fasciitis 04/15/2020   Dysthymia 04/15/2020   Vitamin D deficiency 04/15/2020   Gastroesophageal reflux disease 04/15/2020   Insect bite of left foot 11/24/2019   Pustule 11/24/2019   Right hand pain 03/09/2019   External hemorrhoids 03/03/2018   Other chest pain 02/23/2018   Insomnia 02/24/2017   Healthcare maintenance 02/24/2017   Conjunctivitis 03/01/2014   Seborrheic keratosis, inflamed 01/05/2012   Menopausal syndrome 12/22/2011   Encounter for counseling 11/11/2011   Skin cancer 07/14/2011   Basal cell carcinoma of face 03/10/2011   Migraine 05/14/2007   IRRITABLE BOWEL SYNDROME 05/14/2007   History of mitral valve prolapse 05/14/2007   Allergic rhinitis 04/07/2007   DERMATITIS DUE TO SOLAR RADIATION 04/07/2007   Premature beat 09/20/2006   DIVERTICULOSIS, COLON 04/13/2002    PCP: Ethelene Hal  REFERRING PROVIDER: Marquette Saa DIAG:  LBP with peroneal tendonitis, lumbar radiculopathy  Rationale for Evaluation and Treatment Rehabilitation  THERAPY DIAG:  Other low back pain  Muscle spasm of back  Pain in left foot  Difficulty in walking, not elsewhere classified  Cramp and spasm  Stiffness  of left foot, not elsewhere classified  ONSET DATE: August 2023  SUBJECTIVE:                                                                                                                                                                                           SUBJECTIVE STATEMENT: Patient reports that she has been doing well, did try to rake the leaves and is hurting a little more. PERTINENT HISTORY:  See above  PAIN:  Are you having pain? Yes: NPRS scale: 5/10 Pain location: left dorsum of the foot along the 5th ray and then the top of the foot, has pain at times in the lumbar area and into the SI Pain description: sharp in the foot, dull in the back Aggravating factors:  walking, at times just a sharp pain Relieving factors: medication pain can be down to a 3/10   PRECAUTIONS: None  WEIGHT BEARING RESTRICTIONS No  FALLS:  Has patient fallen in last 6 months? No  LIVING ENVIRONMENT: Lives with: lives with their family Lives in: House/apartment Stairs: Yes: Internal: 12 steps; can reach both Has following equipment at home: None  OCCUPATION: retired  PLOF: Independent and walks some for exercise, golf a few times a year  PATIENT GOALS have less pain, walk without issue   OBJECTIVE:   DIAGNOSTIC FINDINGS:  X-rays negative  PATIENT SURVEYS:  ODI 52%  COGNITION:  Overall cognitive status: Within functional limits for tasks assessed     SENSATION: WFL  MUSCLE LENGTH: Very tight in the HS, calves and piriformis mms  POSTURE:  good posture  PALPATION: Tender in the SI and buttocks, spasms in the lumbar area, very tender dorsal and lateral left foot  LUMBAR ROM:   Active  A/PROM  eval  Flexion Decreased 25%  Extension Decreased 50%  Right lateral flexion Decreased 25%  Left lateral flexion Decreased 25%  Right rotation   Left rotation    (Blank rows = not tested)  LOWER EXTREMITY ROM:     Active  Right eval Left eval  Hip flexion    Hip extension    Hip abduction    Hip adduction    Hip internal rotation    Hip external rotation    Knee flexion    Knee extension    Ankle dorsiflexion  0  Ankle plantarflexion  45  Ankle inversion  10  Ankle eversion  5   (Blank rows = not tested)  LOWER EXTREMITY MMT:    MMT Right eval Left eval  Hip flexion 4- 4-  Hip extension    Hip abduction 4- 4-  Hip adduction  Hip internal rotation    Hip external rotation    Knee flexion    Knee extension    Ankle dorsiflexion 4+ 4-  Ankle plantarflexion    Ankle inversion    Ankle eversion 4+ 4- P!   (Blank rows = not tested)  GAIT: Distance walked: 100 feet Assistive device utilized: None Level of assistance:  Complete Independence Comments: antalgic on the left     TODAY'S TREATMENT  01/13/22 Nustep level 5 x 6 minutes Calf stretches 20 seconds x 5 Seated row 15# Lats 15# 5# straight arm pulls 5# shoulder shrugs On airex ball toss, cone toe touches Airex balance beam tandem walk and side stepping Side step on and off airex Isometric abs Standing hip extension  12/22/21 UBE level 2 x 4 minues Calf stretches Bike level 3 x 5 minutes 20# seated rows 20# lats 20# triceps  5# biceps On dynadisc pelvic mobility and stability with red band 6# farmer carry Seated ball roll outs for low back streches STM to the left foot and some gentle joint mobs  12/17/21 Nustep level 5 x 6 minutes Walk one lap outside this caused pain in the foot Seated row 20# 2x10 Lats 20# 2x10 10# AR press very difficulty with this 20# triceps 5# bicepsSTM to the dorsum of the foot/PROM Ionto to dorsum of the foot   PATIENT EDUCATION:  Education details: Calf and piriformis stretch for HEP Person educated: Patient Education method: Consulting civil engineer, Demonstration, Tactile cues, and Verbal cues Education comprehension: verbalized understanding and returned demonstration   HOME EXERCISE PROGRAM: Calf and piriformis stretches  ASSESSMENT:  CLINICAL IMPRESSION: Patient has not been in due to travel in about 3 weeks, she was having LBP the last time I saw her and this continues.  She did however rake leaves today and reports that it is worse right now than it has been.  She tolerated the exercises well without an increase of pain but her calves are very tight  OBJECTIVE IMPAIRMENTS Abnormal gait, cardiopulmonary status limiting activity, decreased activity tolerance, decreased balance, decreased endurance, decreased mobility, difficulty walking, decreased ROM, decreased strength, increased muscle spasms, impaired flexibility, improper body mechanics, postural dysfunction, and pain.   REHAB POTENTIAL:  Good  CLINICAL DECISION MAKING: Stable/uncomplicated  EVALUATION COMPLEXITY: Low   GOALS: Goals reviewed with patient? Yes  SHORT TERM GOALS: Target date: 12/01/21  Independent with initial HEP Goal status: ongoing  LONG TERM GOALS: Target date: 03/04/22  Independent with advanced HEP Goal status:ongoing  2.  Understand posture and body mechanics Goal status: ongoing  3.  Decrease pain 50% Goal status:ongoing  4.  Walk without difficulty Goal status: ongoing  5.  Increase left ankle DF to 10 degrees Goal status: progressing  6.  Increase lumbar ROM to WNL's Goal status: progressing   PLAN: PT FREQUENCY: 1-2x/week  PT DURATION: 12 weeks  PLANNED INTERVENTIONS: Therapeutic exercises, Therapeutic activity, Neuromuscular re-education, Balance training, Gait training, Patient/Family education, Self Care, Joint mobilization, Electrical stimulation, Spinal mobilization, Cryotherapy, Moist heat, Traction, Ultrasound, and Manual therapy.  PLAN FOR NEXT SESSION: continue to work with her on the foot and the back   [employer], PT 01/13/2022, 4:15 PM

## 2022-01-14 ENCOUNTER — Ambulatory Visit: Payer: Medicare PPO | Admitting: Physical Therapy

## 2022-01-16 ENCOUNTER — Encounter: Payer: Self-pay | Admitting: Cardiovascular Disease

## 2022-01-16 NOTE — Telephone Encounter (Signed)
Patient states she is feeling fine today, but would like to make sure that Dr. Sallyanne Kuster will be able to review the attached report and provide any advisement.

## 2022-01-21 ENCOUNTER — Ambulatory Visit: Payer: Medicare PPO | Admitting: Physical Therapy

## 2022-01-26 ENCOUNTER — Ambulatory Visit: Payer: Medicare PPO | Admitting: Physical Therapy

## 2022-01-26 ENCOUNTER — Encounter: Payer: Self-pay | Admitting: Physical Therapy

## 2022-01-26 DIAGNOSIS — M79672 Pain in left foot: Secondary | ICD-10-CM

## 2022-01-26 DIAGNOSIS — M5459 Other low back pain: Secondary | ICD-10-CM

## 2022-01-26 DIAGNOSIS — M62830 Muscle spasm of back: Secondary | ICD-10-CM | POA: Diagnosis not present

## 2022-01-26 DIAGNOSIS — R252 Cramp and spasm: Secondary | ICD-10-CM

## 2022-01-26 DIAGNOSIS — R262 Difficulty in walking, not elsewhere classified: Secondary | ICD-10-CM | POA: Diagnosis not present

## 2022-01-26 DIAGNOSIS — M25675 Stiffness of left foot, not elsewhere classified: Secondary | ICD-10-CM

## 2022-01-26 NOTE — Therapy (Signed)
OUTPATIENT PHYSICAL THERAPY THORACOLUMBAR TREATMENT   Patient Name: [name] MRN: [id number] DOB:1950/08/06, 71 y.o., female Today's Date: 01/26/2022   PT End of Session - 01/26/22 1011     Visit Number 7    Number of Visits 13    Date for PT Re-Evaluation 03/04/22    Authorization Type [employer]    Authorization Time Period 6/12    PT Start Time 1012    PT Stop Time 1100    PT Time Calculation (min) 48 min    Activity Tolerance Patient tolerated treatment well    Behavior During Therapy WFL for tasks assessed/performed             Past Medical History:  Diagnosis Date   Anal fissure    Anemia    during pregnancy   Anxiety    Basal cell carcinoma    DDD (degenerative disc disease)    Diverticulosis    Hypokalemia    LBP (low back pain)    Melanoma (HCC)    Migraine    MVP (mitral valve prolapse) 1990   psa   Rosacea    Ulcer    as a child   Past Surgical History:  Procedure Laterality Date   ABDOMINAL HYSTERECTOMY  1985   BREAST BIOPSY Left    secondary to infection   I & D EXTREMITY Right 09/09/2012   Procedure: Minor IRRIGATION AND DEBRIDEMENT EXTREMITY paronychia of right thumb;  Surgeon: Wynonia Sours, MD;  Location: [place];  Service: Orthopedics;  Laterality: Right;   I & D EXTREMITY Left 01/26/2014   Procedure: MINOR IINCISION AND DRAINAGE paronychia left index finger;  Surgeon: Daryll Brod, MD;  Location: [place];  Service: Orthopedics;  Laterality: Left;  left index   OOPHORECTOMY Left 1994   REPAIR KNEE LIGAMENT Left [place]   TUBAL LIGATION     VEIN SURGERY     Patient Active Problem List   Diagnosis Date Noted   Cellulitis of finger of right hand 09/08/2021   Infection of skin 05/13/2021   Cervicalgia 05/06/2021   Grieving 05/06/2021   Conductive hearing loss of right ear 03/04/2021   Impacted cerumen of right ear 03/04/2021   Medication side effect 03/03/2021   Excessive cerumen in  right ear canal 03/03/2021   Labyrinthitis 07/30/2020   Palpitations 04/15/2020   Plantar fasciitis 04/15/2020   Dysthymia 04/15/2020   Vitamin D deficiency 04/15/2020   Gastroesophageal reflux disease 04/15/2020   Insect bite of left foot 11/24/2019   Pustule 11/24/2019   Right hand pain 03/09/2019   External hemorrhoids 03/03/2018   Other chest pain 02/23/2018   Insomnia 02/24/2017   Healthcare maintenance 02/24/2017   Conjunctivitis 03/01/2014   Seborrheic keratosis, inflamed 01/05/2012   Menopausal syndrome 12/22/2011   Encounter for counseling 11/11/2011   Skin cancer 07/14/2011   Basal cell carcinoma of face 03/10/2011   Migraine 05/14/2007   IRRITABLE BOWEL SYNDROME 05/14/2007   History of mitral valve prolapse 05/14/2007   Allergic rhinitis 04/07/2007   DERMATITIS DUE TO SOLAR RADIATION 04/07/2007   Premature beat 09/20/2006   DIVERTICULOSIS, COLON 04/13/2002    PCP: Ethelene Hal  REFERRING PROVIDER: Marquette Saa DIAG:  LBP with peroneal tendonitis, lumbar radiculopathy  Rationale for Evaluation and Treatment Rehabilitation  THERAPY DIAG:  Other low back pain  Muscle spasm of back  Pain in left foot  Difficulty in walking, not elsewhere classified  Cramp and spasm  Stiffness  of left foot, not elsewhere classified  ONSET DATE: August 2023  SUBJECTIVE:                                                                                                                                                                                           SUBJECTIVE STATEMENT: I am hurting more today, I stepped on a rock and it is hurting.  She reports that she is still doing a lot due to her husband surgery precautions and really feeling back with the loading car.  PERTINENT HISTORY:  See above  PAIN:  Are you having pain? Yes: NPRS scale: 5/10 Pain location: left dorsum of the foot along the 5th ray and then the top of the foot, has pain at times in the lumbar area and  into the SI Pain description: sharp in the foot, dull in the back Aggravating factors: walking, at times just a sharp pain Relieving factors: medication pain can be down to a 3/10   PRECAUTIONS: None  WEIGHT BEARING RESTRICTIONS No  FALLS:  Has patient fallen in last 6 months? No  LIVING ENVIRONMENT: Lives with: lives with their family Lives in: House/apartment Stairs: Yes: Internal: 12 steps; can reach both Has following equipment at home: None  OCCUPATION: retired  PLOF: Independent and walks some for exercise, golf a few times a year  PATIENT GOALS have less pain, walk without issue   OBJECTIVE:   DIAGNOSTIC FINDINGS:  X-rays negative  PATIENT SURVEYS:  ODI 52%  COGNITION:  Overall cognitive status: Within functional limits for tasks assessed     SENSATION: WFL  MUSCLE LENGTH: Very tight in the HS, calves and piriformis mms  POSTURE:  good posture  PALPATION: Tender in the SI and buttocks, spasms in the lumbar area, very tender dorsal and lateral left foot  LUMBAR ROM:   Active  A/PROM  eval  Flexion Decreased 25%  Extension Decreased 50%  Right lateral flexion Decreased 25%  Left lateral flexion Decreased 25%  Right rotation   Left rotation    (Blank rows = not tested)  LOWER EXTREMITY ROM:     Active  Right eval Left eval  Hip flexion    Hip extension    Hip abduction    Hip adduction    Hip internal rotation    Hip external rotation    Knee flexion    Knee extension    Ankle dorsiflexion  0  Ankle plantarflexion  45  Ankle inversion  10  Ankle eversion  5   (Blank rows = not tested)  LOWER EXTREMITY MMT:    MMT Right eval Left eval  Hip flexion 4- 4-  Hip extension    Hip abduction 4- 4-  Hip adduction    Hip internal rotation    Hip external rotation    Knee flexion    Knee extension    Ankle dorsiflexion 4+ 4-  Ankle plantarflexion    Ankle inversion    Ankle eversion 4+ 4- P!   (Blank rows = not  tested)  GAIT: Distance walked: 100 feet Assistive device utilized: None Level of assistance: Complete Independence Comments: antalgic on the left     TODAY'S TREATMENT  01/26/22 Nustep level 5 x 6 minutes, c/o leg fatigue 20# leg curls 2x10 5# leg extension 2 x10 Airex balance beam side stepping and tandem walking Side step on and off airex On airex ball toss Gastroc and soleus stretches Red tband ankle exercises Seated rows 20# Lats 20# PROM of the left foot, joint mobs mid foot and toes   01/13/22 Nustep level 5 x 6 minutes Calf stretches 20 seconds x 5 Seated row 15# Lats 15# 5# straight arm pulls 5# shoulder shrugs On airex ball toss, cone toe touches Airex balance beam tandem walk and side stepping Side step on and off airex Isometric abs Standing hip extension  12/22/21 UBE level 2 x 4 minues Calf stretches Bike level 3 x 5 minutes 20# seated rows 20# lats 20# triceps  5# biceps On dynadisc pelvic mobility and stability with red band 6# farmer carry Seated ball roll outs for low back streches STM to the left foot and some gentle joint mobs  12/17/21 Nustep level 5 x 6 minutes Walk one lap outside this caused pain in the foot Seated row 20# 2x10 Lats 20# 2x10 10# AR press very difficulty with this 20# triceps 5# bicepsSTM to the dorsum of the foot/PROM Ionto to dorsum of the foot   PATIENT EDUCATION:  Education details: Calf and piriformis stretch for HEP Person educated: Patient Education method: Consulting civil engineer, Demonstration, Tactile cues, and Verbal cues Education comprehension: verbalized understanding and returned demonstration   HOME EXERCISE PROGRAM: Calf and piriformis stretches  ASSESSMENT:  CLINICAL IMPRESSION: Patient continues to really be active and is doing more due to her husband recovering from surgery.  She needs cues for the calf stretches and we talked about her needing to do this at home.  She is still very tender in the  left dorsal foot area over the 4th and 5th rays. OBJECTIVE IMPAIRMENTS Abnormal gait, cardiopulmonary status limiting activity, decreased activity tolerance, decreased balance, decreased endurance, decreased mobility, difficulty walking, decreased ROM, decreased strength, increased muscle spasms, impaired flexibility, improper body mechanics, postural dysfunction, and pain.   REHAB POTENTIAL: Good  CLINICAL DECISION MAKING: Stable/uncomplicated  EVALUATION COMPLEXITY: Low   GOALS: Goals reviewed with patient? Yes  SHORT TERM GOALS: Target date: 12/01/21  Independent with initial HEP Goal status: ongoing  LONG TERM GOALS: Target date: 03/04/22  Independent with advanced HEP Goal status:ongoing  2.  Understand posture and body mechanics Goal status: ongoing  3.  Decrease pain 50% Goal status:ongoing  4.  Walk without difficulty Goal status: partially met  5.  Increase left ankle DF to 10 degrees Goal status: met  6.  Increase lumbar ROM to WNL's Goal status: progressing   PLAN: PT FREQUENCY: 1-2x/week  PT DURATION: 12 weeks  PLANNED INTERVENTIONS: Therapeutic exercises, Therapeutic activity, Neuromuscular re-education, Balance training, Gait training, Patient/Family education, Self Care, Joint mobilization, Electrical stimulation, Spinal mobilization, Cryotherapy, Moist heat, Traction, Ultrasound, and Manual therapy.  PLAN FOR  NEXT SESSION: continue to work with her on the foot and the back   [employer], PT 01/26/2022, 10:12 AM

## 2022-01-29 DIAGNOSIS — H0288A Meibomian gland dysfunction right eye, upper and lower eyelids: Secondary | ICD-10-CM | POA: Diagnosis not present

## 2022-01-29 DIAGNOSIS — H43813 Vitreous degeneration, bilateral: Secondary | ICD-10-CM | POA: Diagnosis not present

## 2022-01-29 DIAGNOSIS — H04123 Dry eye syndrome of bilateral lacrimal glands: Secondary | ICD-10-CM | POA: Diagnosis not present

## 2022-01-29 DIAGNOSIS — H2513 Age-related nuclear cataract, bilateral: Secondary | ICD-10-CM | POA: Diagnosis not present

## 2022-02-16 ENCOUNTER — Ambulatory Visit: Payer: Medicare PPO | Attending: Orthopedic Surgery | Admitting: Physical Therapy

## 2022-02-16 ENCOUNTER — Encounter: Payer: Self-pay | Admitting: Physical Therapy

## 2022-02-16 DIAGNOSIS — M5459 Other low back pain: Secondary | ICD-10-CM | POA: Insufficient documentation

## 2022-02-16 DIAGNOSIS — M79672 Pain in left foot: Secondary | ICD-10-CM | POA: Diagnosis not present

## 2022-02-16 DIAGNOSIS — M62830 Muscle spasm of back: Secondary | ICD-10-CM | POA: Insufficient documentation

## 2022-02-16 DIAGNOSIS — M25675 Stiffness of left foot, not elsewhere classified: Secondary | ICD-10-CM | POA: Insufficient documentation

## 2022-02-16 DIAGNOSIS — R262 Difficulty in walking, not elsewhere classified: Secondary | ICD-10-CM | POA: Diagnosis not present

## 2022-02-16 DIAGNOSIS — R252 Cramp and spasm: Secondary | ICD-10-CM | POA: Insufficient documentation

## 2022-02-16 NOTE — Therapy (Signed)
OUTPATIENT PHYSICAL THERAPY THORACOLUMBAR TREATMENT   Patient Name: [name] MRN: [id number] DOB:February 15, 1950, 72 y.o., female Today's Date: 02/16/2022   PT End of Session - 02/16/22 1446     Visit Number 8    Number of Visits 13    Date for PT Re-Evaluation 03/04/22    Authorization Type [employer]    Authorization Time Period 7/12    PT Start Time 1443    PT Stop Time 1526    PT Time Calculation (min) 43 min    Activity Tolerance Patient tolerated treatment well    Behavior During Therapy WFL for tasks assessed/performed             Past Medical History:  Diagnosis Date   Anal fissure    Anemia    during pregnancy   Anxiety    Basal cell carcinoma    DDD (degenerative disc disease)    Diverticulosis    Hypokalemia    LBP (low back pain)    Melanoma (HCC)    Migraine    MVP (mitral valve prolapse) 1990   psa   Rosacea    Ulcer    as a child   Past Surgical History:  Procedure Laterality Date   ABDOMINAL HYSTERECTOMY  1985   BREAST BIOPSY Left    secondary to infection   I & D EXTREMITY Right 09/09/2012   Procedure: Minor IRRIGATION AND DEBRIDEMENT EXTREMITY paronychia of right thumb;  Surgeon: Wynonia Sours, MD;  Location: [facility];  Service: Orthopedics;  Laterality: Right;   I & D EXTREMITY Left 01/26/2014   Procedure: MINOR IINCISION AND DRAINAGE paronychia left index finger;  Surgeon: Daryll Brod, MD;  Location: [place];  Service: Orthopedics;  Laterality: Left;  left index   OOPHORECTOMY Left 1994   REPAIR KNEE LIGAMENT Left [place]   TUBAL LIGATION     VEIN SURGERY     Patient Active Problem List   Diagnosis Date Noted   Cellulitis of finger of right hand 09/08/2021   Infection of skin 05/13/2021   Cervicalgia 05/06/2021   Grieving 05/06/2021   Conductive hearing loss of right ear 03/04/2021   Impacted cerumen of right ear 03/04/2021   Medication side effect 03/03/2021   Excessive cerumen in  right ear canal 03/03/2021   Labyrinthitis 07/30/2020   Palpitations 04/15/2020   Plantar fasciitis 04/15/2020   Dysthymia 04/15/2020   Vitamin D deficiency 04/15/2020   Gastroesophageal reflux disease 04/15/2020   Insect bite of left foot 11/24/2019   Pustule 11/24/2019   Right hand pain 03/09/2019   External hemorrhoids 03/03/2018   Other chest pain 02/23/2018   Insomnia 02/24/2017   Healthcare maintenance 02/24/2017   Conjunctivitis 03/01/2014   Seborrheic keratosis, inflamed 01/05/2012   Menopausal syndrome 12/22/2011   Encounter for counseling 11/11/2011   Skin cancer 07/14/2011   Basal cell carcinoma of face 03/10/2011   Migraine 05/14/2007   IRRITABLE BOWEL SYNDROME 05/14/2007   History of mitral valve prolapse 05/14/2007   Allergic rhinitis 04/07/2007   DERMATITIS DUE TO SOLAR RADIATION 04/07/2007   Premature beat 09/20/2006   DIVERTICULOSIS, COLON 04/13/2002    PCP: Ethelene Hal  REFERRING PROVIDER: Marquette Saa DIAG:  LBP with peroneal tendonitis, lumbar radiculopathy  Rationale for Evaluation and Treatment Rehabilitation  THERAPY DIAG:  Other low back pain  Muscle spasm of back  Pain in left foot  Difficulty in walking, not elsewhere classified  ONSET DATE: August 2023  SUBJECTIVE:                                                                                                                                                                                           SUBJECTIVE STATEMENT: I am doing well today.  Less pain overall, just a little stiff  PERTINENT HISTORY:  See above  PAIN:  Are you having pain? Yes: NPRS scale: 2/10 Pain location: left dorsum of the foot along the 5th ray and then the top of the foot, has pain at times in the lumbar area and into the SI Pain description: sharp in the foot, dull in the back Aggravating factors: walking, at times just a sharp pain Relieving factors: medication pain can be down to a  3/10   PRECAUTIONS: None  WEIGHT BEARING RESTRICTIONS No  FALLS:  Has patient fallen in last 6 months? No  LIVING ENVIRONMENT: Lives with: lives with their family Lives in: House/apartment Stairs: Yes: Internal: 12 steps; can reach both Has following equipment at home: None  OCCUPATION: retired  PLOF: Independent and walks some for exercise, golf a few times a year  PATIENT GOALS have less pain, walk without issue   OBJECTIVE:   DIAGNOSTIC FINDINGS:  X-rays negative  PATIENT SURVEYS:  ODI 52%  COGNITION:  Overall cognitive status: Within functional limits for tasks assessed     SENSATION: WFL  MUSCLE LENGTH: Very tight in the HS, calves and piriformis mms  POSTURE:  good posture  PALPATION: Tender in the SI and buttocks, spasms in the lumbar area, very tender dorsal and lateral left foot  LUMBAR ROM:   Active  A/PROM  eval  Flexion Decreased 25%  Extension Decreased 50%  Right lateral flexion Decreased 25%  Left lateral flexion Decreased 25%  Right rotation   Left rotation    (Blank rows = not tested)  LOWER EXTREMITY ROM:     Active  Right eval Left eval  Hip flexion    Hip extension    Hip abduction    Hip adduction    Hip internal rotation    Hip external rotation    Knee flexion    Knee extension    Ankle dorsiflexion  0  Ankle plantarflexion  45  Ankle inversion  10  Ankle eversion  5   (Blank rows = not tested)  LOWER EXTREMITY MMT:    MMT Right eval Left eval  Hip flexion 4- 4-  Hip extension    Hip abduction 4- 4-  Hip adduction    Hip internal rotation    Hip external rotation    Knee flexion  Knee extension    Ankle dorsiflexion 4+ 4-  Ankle plantarflexion    Ankle inversion    Ankle eversion 4+ 4- P!   (Blank rows = not tested)  GAIT: Distance walked: 100 feet Assistive device utilized: None Level of assistance: Complete Independence Comments: antalgic on the left     TODAY'S TREATMENT  02/16/22 Nustep  level 5 x 6 minutes Calf stretches 20# 2x10 leg press 5# leg extension Sidestepping on and off airex On airex cone toe touches Resisted gait 20# all dierections some pain STM and manual stretches   01/26/22 Nustep level 5 x 6 minutes, c/o leg fatigue 20# leg curls 2x10 5# leg extension 2 x10 Airex balance beam side stepping and tandem walking Side step on and off airex On airex ball toss Gastroc and soleus stretches Red tband ankle exercises Seated rows 20# Lats 20# PROM of the left foot, joint mobs mid foot and toes   01/13/22 Nustep level 5 x 6 minutes Calf stretches 20 seconds x 5 Seated row 15# Lats 15# 5# straight arm pulls 5# shoulder shrugs On airex ball toss, cone toe touches Airex balance beam tandem walk and side stepping Side step on and off airex Isometric abs Standing hip extension  12/22/21 UBE level 2 x 4 minues Calf stretches Bike level 3 x 5 minutes 20# seated rows 20# lats 20# triceps  5# biceps On dynadisc pelvic mobility and stability with red band 6# farmer carry Seated ball roll outs for low back streches STM to the left foot and some gentle joint mobs  12/17/21 Nustep level 5 x 6 minutes Walk one lap outside this caused pain in the foot Seated row 20# 2x10 Lats 20# 2x10 10# AR press very difficulty with this 20# triceps 5# bicepsSTM to the dorsum of the foot/PROM Ionto to dorsum of the foot   PATIENT EDUCATION:  Education details: Calf and piriformis stretch for HEP Person educated: Patient Education method: Consulting civil engineer, Demonstration, Tactile cues, and Verbal cues Education comprehension: verbalized understanding and returned demonstration   HOME EXERCISE PROGRAM: Calf and piriformis stretches  ASSESSMENT:  CLINICAL IMPRESSION: Patient reports that she was doing well but has not been doing much exercises or walking, during todays session with the resisted gait she had some pain with this, very sore with the mobilization of  the mid foot OBJECTIVE IMPAIRMENTS Abnormal gait, cardiopulmonary status limiting activity, decreased activity tolerance, decreased balance, decreased endurance, decreased mobility, difficulty walking, decreased ROM, decreased strength, increased muscle spasms, impaired flexibility, improper body mechanics, postural dysfunction, and pain.   REHAB POTENTIAL: Good  CLINICAL DECISION MAKING: Stable/uncomplicated  EVALUATION COMPLEXITY: Low   GOALS: Goals reviewed with patient? Yes  SHORT TERM GOALS: Target date: 12/01/21  Independent with initial HEP Goal status: met  LONG TERM GOALS: Target date: 03/04/22  Independent with advanced HEP Goal status:ongoing  2.  Understand posture and body mechanics Goal status: met  3.  Decrease pain 50% Goal status:ongoing  4.  Walk without difficulty Goal status: partially met  5.  Increase left ankle DF to 10 degrees Goal status: met  6.  Increase lumbar ROM to WNL's Goal status: progressing   PLAN: PT FREQUENCY: 1-2x/week  PT DURATION: 12 weeks  PLANNED INTERVENTIONS: Therapeutic exercises, Therapeutic activity, Neuromuscular re-education, Balance training, Gait training, Patient/Family education, Self Care, Joint mobilization, Electrical stimulation, Spinal mobilization, Cryotherapy, Moist heat, Traction, Ultrasound, and Manual therapy.  PLAN FOR NEXT SESSION: continue to work with her on the foot and the back  Sumner Boast, PT 02/16/2022, 2:47 PM

## 2022-02-17 ENCOUNTER — Ambulatory Visit: Payer: Medicare PPO | Admitting: Physical Therapy

## 2022-02-25 ENCOUNTER — Ambulatory Visit: Payer: Medicare PPO | Admitting: Physical Therapy

## 2022-03-02 ENCOUNTER — Encounter: Payer: Self-pay | Admitting: Physical Therapy

## 2022-03-02 ENCOUNTER — Ambulatory Visit: Payer: Medicare PPO | Admitting: Physical Therapy

## 2022-03-02 DIAGNOSIS — M25675 Stiffness of left foot, not elsewhere classified: Secondary | ICD-10-CM

## 2022-03-02 DIAGNOSIS — R262 Difficulty in walking, not elsewhere classified: Secondary | ICD-10-CM

## 2022-03-02 DIAGNOSIS — M5459 Other low back pain: Secondary | ICD-10-CM | POA: Diagnosis not present

## 2022-03-02 DIAGNOSIS — M62830 Muscle spasm of back: Secondary | ICD-10-CM | POA: Diagnosis not present

## 2022-03-02 DIAGNOSIS — M79672 Pain in left foot: Secondary | ICD-10-CM

## 2022-03-02 DIAGNOSIS — R252 Cramp and spasm: Secondary | ICD-10-CM | POA: Diagnosis not present

## 2022-03-02 NOTE — Therapy (Signed)
OUTPATIENT PHYSICAL THERAPY THORACOLUMBAR TREATMENT   Patient Name: [name] MRN: [id number] DOB:1950-07-13, 72 y.o., female Today's Date: 03/02/2022   PT End of Session - 03/02/22 1659     Visit Number 9    Number of Visits 13    Date for PT Re-Evaluation 03/04/22    Authorization Type [employer]    Authorization Time Period 8/12    PT Start Time 1655    PT Stop Time 1740    PT Time Calculation (min) 45 min    Activity Tolerance Patient tolerated treatment well    Behavior During Therapy WFL for tasks assessed/performed             Past Medical History:  Diagnosis Date   Anal fissure    Anemia    during pregnancy   Anxiety    Basal cell carcinoma    DDD (degenerative disc disease)    Diverticulosis    Hypokalemia    LBP (low back pain)    Melanoma (HCC)    Migraine    MVP (mitral valve prolapse) 1990   psa   Rosacea    Ulcer    as a child   Past Surgical History:  Procedure Laterality Date   ABDOMINAL HYSTERECTOMY  1985   BREAST BIOPSY Left    secondary to infection   I & D EXTREMITY Right 09/09/2012   Procedure: Minor IRRIGATION AND DEBRIDEMENT EXTREMITY paronychia of right thumb;  Surgeon: Wynonia Sours, MD;  Location: [place];  Service: Orthopedics;  Laterality: Right;   I & D EXTREMITY Left 01/26/2014   Procedure: MINOR IINCISION AND DRAINAGE paronychia left index finger;  Surgeon: Daryll Brod, MD;  Location: [place];  Service: Orthopedics;  Laterality: Left;  left index   OOPHORECTOMY Left 1994   REPAIR KNEE LIGAMENT Left [place]   TUBAL LIGATION     VEIN SURGERY     Patient Active Problem List   Diagnosis Date Noted   Cellulitis of finger of right hand 09/08/2021   Infection of skin 05/13/2021   Cervicalgia 05/06/2021   Grieving 05/06/2021   Conductive hearing loss of right ear 03/04/2021   Impacted cerumen of right ear 03/04/2021   Medication side effect 03/03/2021   Excessive cerumen in  right ear canal 03/03/2021   Labyrinthitis 07/30/2020   Palpitations 04/15/2020   Plantar fasciitis 04/15/2020   Dysthymia 04/15/2020   Vitamin D deficiency 04/15/2020   Gastroesophageal reflux disease 04/15/2020   Insect bite of left foot 11/24/2019   Pustule 11/24/2019   Right hand pain 03/09/2019   External hemorrhoids 03/03/2018   Other chest pain 02/23/2018   Insomnia 02/24/2017   Healthcare maintenance 02/24/2017   Conjunctivitis 03/01/2014   Seborrheic keratosis, inflamed 01/05/2012   Menopausal syndrome 12/22/2011   Encounter for counseling 11/11/2011   Skin cancer 07/14/2011   Basal cell carcinoma of face 03/10/2011   Migraine 05/14/2007   IRRITABLE BOWEL SYNDROME 05/14/2007   History of mitral valve prolapse 05/14/2007   Allergic rhinitis 04/07/2007   DERMATITIS DUE TO SOLAR RADIATION 04/07/2007   Premature beat 09/20/2006   DIVERTICULOSIS, COLON 04/13/2002    PCP: Ethelene Hal  REFERRING PROVIDER: Marquette Saa DIAG:  LBP with peroneal tendonitis, lumbar radiculopathy  Rationale for Evaluation and Treatment Rehabilitation  THERAPY DIAG:  Other low back pain  Muscle spasm of back  Pain in left foot  Difficulty in walking, not elsewhere classified  Cramp and spasm  Stiffness  of left foot, not elsewhere classified  ONSET DATE: August 2023  SUBJECTIVE:                                                                                                                                                                                           SUBJECTIVE STATEMENT: A little sore I have [place] and had to go up and down the steps a lot and my back is sore  PERTINENT HISTORY:  See above  PAIN:  Are you having pain? Yes: NPRS scale: 2/10 Pain location: left dorsum of the foot along the 5th ray and then the top of the foot, has pain at times in the lumbar area and into the SI Pain description: sharp in the foot, dull in the  back Aggravating factors: walking, at times just a sharp pain Relieving factors: medication pain can be down to a 3/10   PRECAUTIONS: None  WEIGHT BEARING RESTRICTIONS No  FALLS:  Has patient fallen in last 6 months? No  LIVING ENVIRONMENT: Lives with: lives with their family Lives in: House/apartment Stairs: Yes: Internal: 12 steps; can reach both Has following equipment at home: None  OCCUPATION: retired  PLOF: Independent and walks some for exercise, golf a few times a year  PATIENT GOALS have less pain, walk without issue   OBJECTIVE:   DIAGNOSTIC FINDINGS:  X-rays negative  PATIENT SURVEYS:  ODI 52%  COGNITION:  Overall cognitive status: Within functional limits for tasks assessed     SENSATION: WFL  MUSCLE LENGTH: Very tight in the HS, calves and piriformis mms  POSTURE:  good posture  PALPATION: Tender in the SI and buttocks, spasms in the lumbar area, very tender dorsal and lateral left foot  LUMBAR ROM:   Active  A/PROM  eval  Flexion Decreased 25%  Extension Decreased 50%  Right lateral flexion Decreased 25%  Left lateral flexion Decreased 25%  Right rotation   Left rotation    (Blank rows = not tested)  LOWER EXTREMITY ROM:     Active  Right eval Left eval  Hip flexion    Hip extension    Hip abduction    Hip adduction    Hip internal rotation    Hip external rotation    Knee flexion    Knee extension    Ankle dorsiflexion  0  Ankle plantarflexion  45  Ankle inversion  10  Ankle eversion  5   (Blank rows = not tested)  LOWER EXTREMITY MMT:    MMT Right eval Left eval  Hip flexion 4- 4-  Hip extension  Hip abduction 4- 4-  Hip adduction    Hip internal rotation    Hip external rotation    Knee flexion    Knee extension    Ankle dorsiflexion 4+ 4-  Ankle plantarflexion    Ankle inversion    Ankle eversion 4+ 4- P!   (Blank rows = not tested)  GAIT: Distance walked: 100 feet Assistive device utilized:  None Level of assistance: Complete Independence Comments: antalgic on the left     TODAY'S TREATMENT  03/02/22 HEP review and addition and performed with a lot of Questions and verbal and tactile cues, really Emphasized the stretches and performed  02/16/22 Nustep level 5 x 6 minutes Calf stretches 20# 2x10 leg press 5# leg extension Sidestepping on and off airex On airex cone toe touches Resisted gait 20# all dierections some pain STM and manual stretches   01/26/22 Nustep level 5 x 6 minutes, c/o leg fatigue 20# leg curls 2x10 5# leg extension 2 x10 Airex balance beam side stepping and tandem walking Side step on and off airex On airex ball toss Gastroc and soleus stretches Red tband ankle exercises Seated rows 20# Lats 20# PROM of the left foot, joint mobs mid foot and toes   01/13/22 Nustep level 5 x 6 minutes Calf stretches 20 seconds x 5 Seated row 15# Lats 15# 5# straight arm pulls 5# shoulder shrugs On airex ball toss, cone toe touches Airex balance beam tandem walk and side stepping Side step on and off airex Isometric abs Standing hip extension  12/22/21 UBE level 2 x 4 minues Calf stretches Bike level 3 x 5 minutes 20# seated rows 20# lats 20# triceps  5# biceps On dynadisc pelvic mobility and stability with red band 6# farmer carry Seated ball roll outs for low back streches STM to the left foot and some gentle joint mobs  12/17/21 Nustep level 5 x 6 minutes Walk one lap outside this caused pain in the foot Seated row 20# 2x10 Lats 20# 2x10 10# AR press very difficulty with this 20# triceps 5# bicepsSTM to the dorsum of the foot/PROM Ionto to dorsum of the foot   PATIENT EDUCATION:  Education details: Calf and piriformis stretch for HEP Person educated: Patient Education method: Explanation, Demonstration, Tactile cues, and Verbal cues Education comprehension: verbalized understanding and returned demonstration   HOME EXERCISE  PROGRAM: Access Code: [email or web address] URL: [email or web address] Date: 03/02/2022 Prepared by: Lum Babe  Exercises - Standing Row with Anchored Resistance  - 1 x daily - 7 x weekly - 3 sets - 10 reps - 3 hold - Shoulder Extension with Resistance  - 1 x daily - 7 x weekly - 3 sets - 10 reps - 3 hold - Standing Hip Extension with Anchored Resistance  - 1 x daily - 7 x weekly - 3 sets - 10 reps - 3 hold - Standing Hip Abduction with Anchored Resistance  - 1 x daily - 7 x weekly - 3 sets - 10 reps - 3 hold - Elbow Extension Self Anchored with Resistance  - 1 x daily - 7 x weekly - 3 sets - 10 reps - 3 hold - Standing Bicep Curls with Resistance  - 1 x daily - 7 x weekly - 3 sets - 10 reps - 3 hold - Standing Shoulder External Rotation with Resistance  - 1 x daily - 7 x weekly - 3 sets - 10 reps - 30 hold - Ankle and Toe Plantarflexion with Resistance  - 1  x daily - 7 x weekly - 3 sets - 10 reps - 3 hold - Seated Ankle Eversion with Anchored Resistance  - 1 x daily - 7 x weekly - 3 sets - 10 reps - 3 hold  ASSESSMENT:  CLINICAL IMPRESSION: I finalized the HEP today with her and went over and performed needed cues verbal and tactile to do correctly.  Biggest issue is she is very tight in the calves and HS which I think lends to foot, ankle and back issues, I stressed that this was the most important thing for her to do OBJECTIVE IMPAIRMENTS Abnormal gait, cardiopulmonary status limiting activity, decreased activity tolerance, decreased balance, decreased endurance, decreased mobility, difficulty walking, decreased ROM, decreased strength, increased muscle spasms, impaired flexibility, improper body mechanics, postural dysfunction, and pain.   REHAB POTENTIAL: Good  CLINICAL DECISION MAKING: Stable/uncomplicated  EVALUATION COMPLEXITY: Low   GOALS: Goals reviewed with patient? Yes  SHORT TERM GOALS: Target date: 12/01/21  Independent with initial HEP Goal status:  met  LONG TERM GOALS: Target date: 03/04/22  Independent with advanced HEP Goal status:met  2.  Understand posture and body mechanics Goal status: met  3.  Decrease pain 50% Goal status:met  4.  Walk without difficulty Goal status: met  5.  Increase left ankle DF to 10 degrees Goal status: met  6.  Increase lumbar ROM to WNL's Goal status: met   PLAN: PT FREQUENCY: 1-2x/week  PT DURATION: 12 weeks  PLANNED INTERVENTIONS: Therapeutic exercises, Therapeutic activity, Neuromuscular re-education, Balance training, Gait training, Patient/Family education, Self Care, Joint mobilization, Electrical stimulation, Spinal mobilization, Cryotherapy, Moist heat, Traction, Ultrasound, and Manual therapy.  PLAN FOR NEXT SESSION:D/C goals met   Sumner Boast, PT 03/02/2022, 5:00 PM

## 2022-03-04 ENCOUNTER — Encounter: Payer: Medicare PPO | Admitting: Physical Therapy

## 2022-03-16 DIAGNOSIS — L03031 Cellulitis of right toe: Secondary | ICD-10-CM | POA: Diagnosis not present

## 2022-03-16 DIAGNOSIS — L03032 Cellulitis of left toe: Secondary | ICD-10-CM | POA: Diagnosis not present

## 2022-03-16 DIAGNOSIS — L600 Ingrowing nail: Secondary | ICD-10-CM | POA: Diagnosis not present

## 2022-03-26 DIAGNOSIS — L03032 Cellulitis of left toe: Secondary | ICD-10-CM | POA: Diagnosis not present

## 2022-03-26 DIAGNOSIS — L600 Ingrowing nail: Secondary | ICD-10-CM | POA: Diagnosis not present

## 2022-03-26 DIAGNOSIS — L03031 Cellulitis of right toe: Secondary | ICD-10-CM | POA: Diagnosis not present

## 2022-04-01 DIAGNOSIS — L821 Other seborrheic keratosis: Secondary | ICD-10-CM | POA: Diagnosis not present

## 2022-04-01 DIAGNOSIS — L570 Actinic keratosis: Secondary | ICD-10-CM | POA: Diagnosis not present

## 2022-04-01 DIAGNOSIS — Z85828 Personal history of other malignant neoplasm of skin: Secondary | ICD-10-CM | POA: Diagnosis not present

## 2022-04-01 DIAGNOSIS — L578 Other skin changes due to chronic exposure to nonionizing radiation: Secondary | ICD-10-CM | POA: Diagnosis not present

## 2022-04-01 DIAGNOSIS — L720 Epidermal cyst: Secondary | ICD-10-CM | POA: Diagnosis not present

## 2022-04-01 DIAGNOSIS — H61001 Unspecified perichondritis of right external ear: Secondary | ICD-10-CM | POA: Diagnosis not present

## 2022-04-01 DIAGNOSIS — D2261 Melanocytic nevi of right upper limb, including shoulder: Secondary | ICD-10-CM | POA: Diagnosis not present

## 2022-04-22 DIAGNOSIS — D2262 Melanocytic nevi of left upper limb, including shoulder: Secondary | ICD-10-CM | POA: Diagnosis not present

## 2022-04-22 DIAGNOSIS — D2271 Melanocytic nevi of right lower limb, including hip: Secondary | ICD-10-CM | POA: Diagnosis not present

## 2022-04-22 DIAGNOSIS — L738 Other specified follicular disorders: Secondary | ICD-10-CM | POA: Diagnosis not present

## 2022-04-22 DIAGNOSIS — Z85828 Personal history of other malignant neoplasm of skin: Secondary | ICD-10-CM | POA: Diagnosis not present

## 2022-04-22 DIAGNOSIS — D225 Melanocytic nevi of trunk: Secondary | ICD-10-CM | POA: Diagnosis not present

## 2022-04-22 DIAGNOSIS — L821 Other seborrheic keratosis: Secondary | ICD-10-CM | POA: Diagnosis not present

## 2022-04-22 DIAGNOSIS — D2272 Melanocytic nevi of left lower limb, including hip: Secondary | ICD-10-CM | POA: Diagnosis not present

## 2022-04-22 DIAGNOSIS — L814 Other melanin hyperpigmentation: Secondary | ICD-10-CM | POA: Diagnosis not present

## 2022-05-11 ENCOUNTER — Other Ambulatory Visit: Payer: Self-pay

## 2022-05-11 ENCOUNTER — Telehealth: Payer: Self-pay | Admitting: Cardiovascular Disease

## 2022-05-11 ENCOUNTER — Encounter: Payer: Self-pay | Admitting: Cardiovascular Disease

## 2022-05-11 DIAGNOSIS — I4949 Other premature depolarization: Secondary | ICD-10-CM

## 2022-05-11 DIAGNOSIS — R002 Palpitations: Secondary | ICD-10-CM

## 2022-05-11 MED ORDER — ATENOLOL 25 MG PO TABS: 12.5000 mg | ORAL_TABLET | Freq: Every day | ORAL | 0 refills | Status: DC | PRN

## 2022-05-11 NOTE — Telephone Encounter (Signed)
It would be great if she could send the [place] recording obtained during symptoms. That would be more useful than a spot check of the rhythm in the office. Please refill the atenolol. Keep August appt unless we see something concerning on the Kardia. Avoid stimulants such as excessive coffee, decongestants lie pseudoephedrine, etc,

## 2022-05-11 NOTE — Telephone Encounter (Signed)
Called patient, advised of message below from MD.   Advised of pharmacy from patient as they were out of town- refilled Atenolol, will send Kardia mobile, and avoid stimulants.   Patient verbalized understanding

## 2022-05-11 NOTE — Telephone Encounter (Signed)
Patient returned call.   Patient states that she has been having palpitations again, she states she wants to know if she needs to be seen. She states her HR can getup to the 90's and at times the feeling makes her feel like she has to cough. She has no other symptoms to report. She does have [place] device, advised for her to get a reading on this and send it via mychart for review.   Patient also has Atenolol as needed taking half a tablet on file, however her medication is out of date- she is requested a refill be sent to the pharmacy. Will check with MD to advise if okay to do this.   Patient did make yearly follow up in August, but she states if she needs to be seen sooner she would. Patient advised I would send a message over and see what MD recommends.   Advised to stay hydrated, monitor via kardia mobile and send reports, and review if okay to do atenolol.

## 2022-05-11 NOTE — Telephone Encounter (Signed)
Attempted to contact patient to discuss. LVM to call back.

## 2022-05-11 NOTE — Telephone Encounter (Signed)
STAT if HR is under 50 or over 120 (normal HR is 60-100 beats per minute)  What is your heart rate? 82  Do you have a log of your heart rate readings (document readings)? 90, 98  Do you have any other symptoms? She states sometimes it skips beats.  She states it makes her cough sometimes, she is not sure if it's the pollen.

## 2022-05-13 NOTE — Telephone Encounter (Signed)
Patient called back in, asked to speak with me- advising that she was confused on Dr.Croitoru's message. He advised he would speak with her on Friday, sh was questioning if an appointment needed to be made for Friday- I advised she may just need to mychart or call in on Friday to review how she is doing with the atenolol.   Patient verbalized understanding, advised I would check with Dr.C and if he wanted to see her I would call back.

## 2022-05-15 NOTE — Telephone Encounter (Signed)
Same rhythm: sinus with PACs- did she try to take extra atenolol? did it help?

## 2022-05-20 ENCOUNTER — Ambulatory Visit (INDEPENDENT_AMBULATORY_CARE_PROVIDER_SITE_OTHER): Payer: Medicare PPO | Admitting: Family Medicine

## 2022-05-20 ENCOUNTER — Encounter: Payer: Self-pay | Admitting: Family Medicine

## 2022-05-20 VITALS — BP 120/70 | HR 61 | Temp 97.7°F | Ht 62.0 in | Wt 113.0 lb

## 2022-05-20 DIAGNOSIS — G43C0 Periodic headache syndromes in child or adult, not intractable: Secondary | ICD-10-CM | POA: Diagnosis not present

## 2022-05-20 DIAGNOSIS — M79644 Pain in right finger(s): Secondary | ICD-10-CM | POA: Diagnosis not present

## 2022-05-20 DIAGNOSIS — Z0001 Encounter for general adult medical examination with abnormal findings: Secondary | ICD-10-CM

## 2022-05-20 DIAGNOSIS — H8309 Labyrinthitis, unspecified ear: Secondary | ICD-10-CM | POA: Diagnosis not present

## 2022-05-20 DIAGNOSIS — R7989 Other specified abnormal findings of blood chemistry: Secondary | ICD-10-CM

## 2022-05-20 DIAGNOSIS — K644 Residual hemorrhoidal skin tags: Secondary | ICD-10-CM

## 2022-05-20 DIAGNOSIS — R002 Palpitations: Secondary | ICD-10-CM

## 2022-05-20 DIAGNOSIS — R7303 Prediabetes: Secondary | ICD-10-CM | POA: Diagnosis not present

## 2022-05-20 DIAGNOSIS — Z0000 Encounter for general adult medical examination without abnormal findings: Secondary | ICD-10-CM

## 2022-05-20 LAB — URINALYSIS, ROUTINE W REFLEX MICROSCOPIC
Bilirubin Urine: NEGATIVE
Hgb urine dipstick: NEGATIVE
Ketones, ur: NEGATIVE
Leukocytes,Ua: NEGATIVE
Nitrite: NEGATIVE
RBC / HPF: NONE SEEN (ref 0–?)
Specific Gravity, Urine: <=1.005 — AB (ref 1.000–1.030)
Total Protein, Urine: NEGATIVE
Urine Glucose: NEGATIVE
Urobilinogen, UA: 0.2 (ref 0.0–1.0)
pH: 7 (ref 5.0–8.0)

## 2022-05-20 LAB — CBC
HCT: 39.9 % (ref 36.0–46.0)
Hemoglobin: 13.3 g/dL (ref 12.0–15.0)
MCHC: 33.4 g/dL (ref 30.0–36.0)
MCV: 95.8 fl (ref 78.0–100.0)
Platelets: 312 10*3/uL (ref 150.0–400.0)
RBC: 4.16 Mil/uL (ref 3.87–5.11)
RDW: 12.7 % (ref 11.5–15.5)
WBC: 7.2 10*3/uL (ref 4.0–10.5)

## 2022-05-20 LAB — LIPID PANEL
Cholesterol: 175 mg/dL (ref 0–200)
HDL: 68.3 mg/dL (ref >39.00)
LDL Cholesterol: 84 mg/dL (ref 0–99)
NonHDL: 106.36
Total CHOL/HDL Ratio: 3
Triglycerides: 111 mg/dL (ref 0.0–149.0)
VLDL: 22.2 mg/dL (ref 0.0–40.0)

## 2022-05-20 LAB — COMPREHENSIVE METABOLIC PANEL
ALT: 13 U/L (ref 0–35)
AST: 17 U/L (ref 0–37)
Albumin: 4.4 g/dL (ref 3.5–5.2)
Alkaline Phosphatase: 61 U/L (ref 39–117)
BUN: 16 mg/dL (ref 6–23)
CO2: 28 mEq/L (ref 19–32)
Calcium: 10 mg/dL (ref 8.4–10.5)
Chloride: 103 mEq/L (ref 96–112)
Creatinine, Ser: 0.7 mg/dL (ref 0.40–1.20)
GFR: 85.65 mL/min (ref >60.00)
Glucose, Bld: 93 mg/dL (ref 70–99)
Potassium: 3.9 mEq/L (ref 3.5–5.1)
Sodium: 141 mEq/L (ref 135–145)
Total Bilirubin: 0.5 mg/dL (ref 0.2–1.2)
Total Protein: 7.1 g/dL (ref 6.0–8.3)

## 2022-05-20 LAB — T3, FREE: T3, Free: 3.7 pg/mL (ref 2.3–4.2)

## 2022-05-20 LAB — TSH: TSH: 1.1 u[IU]/mL (ref 0.35–5.50)

## 2022-05-20 LAB — T4, FREE: Free T4: 0.9 ng/dL (ref 0.60–1.60)

## 2022-05-20 LAB — HEMOGLOBIN A1C: Hgb A1c MFr Bld: 5.9 % (ref 4.6–6.5)

## 2022-05-20 MED ORDER — HYDROCORTISONE (PERIANAL) 2.5 % EX CREA: 1.0000 | TOPICAL_CREAM | Freq: Two times a day (BID) | CUTANEOUS | 0 refills | Status: DC

## 2022-05-20 MED ORDER — ZOLMITRIPTAN 2.5 MG PO TBDP: ORAL_TABLET | ORAL | 4 refills | Status: DC

## 2022-05-20 MED ORDER — MECLIZINE HCL 12.5 MG PO TABS: ORAL_TABLET | ORAL | 1 refills | Status: DC

## 2022-05-20 NOTE — Progress Notes (Signed)
Established Patient Office Visit   Subjective:  Patient ID: [name], female    DOB: [birth year]  Age: 72 y.o. MRN: [id number]  Chief Complaint  Patient presents with   Annual Exam    CPE,check knots on right palm, heart palpitations with rocking movements, full blood work panel. Patient fasting.     HPI Encounter Diagnoses  Name Primary?   Prediabetes Yes   Palpitations    Pain of right thumb    Healthcare maintenance    Low TSH level    Periodic headache syndrome, not intractable    External hemorrhoids    Labyrinthitis, unspecified laterality    Here for physical exam.  Mostly doing well.  She is exercising daily by walking 2 to 3 miles.  She has regular dental care.  She is enjoying time with her husband.  She has been experiencing palpitations over the last month or so.  Interestingly, she states that, when she back-and-forth in her walking chair.  There is no chest pain or shortness of breath.  They come and go at will.  She has a device at home and is recording her events and is being followed by her cardiologist.  She rarely drinks alcohol and does not consume caffeine.  Use illicit drugs.  She has been having pain right thumb.  No recent injury.  RHD.   Review of Systems  Constitutional: Negative.  Negative for diaphoresis.  HENT: Negative.    Eyes:  Negative for blurred vision, discharge and redness.  Respiratory: Negative.  Negative for shortness of breath.   Cardiovascular:  Positive for palpitations. Negative for chest pain.  Gastrointestinal:  Negative for abdominal pain, nausea and vomiting.  Genitourinary: Negative.   Musculoskeletal:  Positive for joint pain. Negative for myalgias.  Skin:  Negative for rash.  Neurological:  Negative for tingling, loss of consciousness and weakness.  Endo/Heme/Allergies:  Negative for polydipsia.      05/20/2022    8:13 AM 12/16/2021    3:02 PM 09/08/2021    4:14 PM  Depression screen PHQ 2/9  Decreased Interest 0 0 0   Down, Depressed, Hopeless 0 0 0  PHQ - 2 Score 0 0 0      Current Outpatient Medications:    Ascorbic Acid (VITAMIN C) 1000 MG tablet, SMARTSIG:1 By Mouth, Disp: , Rfl:    atenolol (TENORMIN) 25 MG tablet, Take 0.5 tablets (12.5 mg total) by mouth daily as needed (for palpitations)., Disp: 15 tablet, Rfl: 0   calcium-vitamin D 250-100 MG-UNIT per tablet, Take 1 tablet by mouth daily., Disp: , Rfl:    clobetasol ointment (TEMOVATE) 0.05 %, clobetasol 0.05 % topical ointment  APPLY TO VULVA TWICE WEEKLY, Disp: , Rfl:    Doxylamine Succinate, Sleep, (SLEEP-AID PO), Take by mouth. [employer] sleep aid, Disp: , Rfl:    estradiol (ESTRACE) 0.5 MG tablet, Take 0.5 mg by mouth daily., Disp: , Rfl:    L-LYSINE PO, Take 1 tablet by mouth 2 (two) times a week., Disp: , Rfl:    Multiple Vitamin (MULTIVITAMIN) tablet, Take 1 tablet by mouth daily., Disp: , Rfl:    Phenyleph-Diphenhyd-Hydrocod (HYDRO-DP PO), Take by mouth. Hydroeye, Disp: , Rfl:    RESTASIS 0.05 % ophthalmic emulsion, , Disp: , Rfl:    SAMBUCOL BLACK ELDERBERRY PO, Take 1 capsule by mouth 2 (two) times a week., Disp: , Rfl:    hydrocortisone (ANUSOL-HC) 2.5 % rectal cream, Place 1 Application rectally 2 (two) times daily., Disp: 30 g,  Rfl: 0   meclizine (ANTIVERT) 12.5 MG tablet, meclizine 12.5 mg tablet  one tablet every 6-12 hours as needed for dizziness, Disp: 30 tablet, Rfl: 1   meloxicam (MOBIC) 15 MG tablet, TAKE 1 TABLET BY MOUTH AS NEEDED (Patient not taking: Reported on 05/20/2022), Disp: 30 tablet, Rfl: 1   omeprazole (PRILOSEC) 20 MG capsule, Take 1 capsule (20 mg total) by mouth daily. AS NEEDED (Patient not taking: Reported on 10/06/2021), Disp: 100 capsule, Rfl: 4   zolmitriptan (ZOMIG-ZMT) 2.5 MG disintegrating tablet, TAKE 1 TABLET BY MOUTH AS NEEDED FOR MIGRAINE, Disp: 10 tablet, Rfl: 4   Objective:     BP 120/70 (BP Location: Left Arm, Patient Position: Sitting, Cuff Size: Normal)   Pulse 61   Temp 97.7 F (36.5 C)  (Temporal)   Ht 5\' 2"  (1.575 m)   Wt 113 lb (51.3 kg)   SpO2 93%   BMI 20.67 kg/m    Physical Exam Constitutional:      General: She is not in acute distress.    Appearance: Normal appearance. She is not ill-appearing, toxic-appearing or diaphoretic.  HENT:     Head: Normocephalic and atraumatic.     Right Ear: External ear normal.     Left Ear: External ear normal.     Mouth/Throat:     Mouth: Mucous membranes are moist.     Pharynx: Oropharynx is clear. No oropharyngeal exudate or posterior oropharyngeal erythema.  Eyes:     General: No scleral icterus.       Right eye: No discharge.        Left eye: No discharge.     Extraocular Movements: Extraocular movements intact.     Conjunctiva/sclera: Conjunctivae normal.     Pupils: Pupils are equal, round, and reactive to light.  Cardiovascular:     Rate and Rhythm: Normal rate and regular rhythm.  Pulmonary:     Effort: Pulmonary effort is normal. No respiratory distress.     Breath sounds: Normal breath sounds.  Abdominal:     General: Bowel sounds are normal.     Tenderness: There is no abdominal tenderness. There is no guarding.  Musculoskeletal:       Arms:     Cervical back: No rigidity or tenderness.  Skin:    General: Skin is warm and dry.  Neurological:     Mental Status: She is alert and oriented to person, place, and time.  Psychiatric:        Mood and Affect: Mood normal.        Behavior: Behavior normal.      No results found for any visits on 05/20/22.    The 10-year ASCVD risk score (Arnett DK, et al., 2019) is: 11.1%    Assessment & Plan:   Prediabetes -     Comprehensive metabolic panel -     Hemoglobin A1c  Palpitations -     TSH -     T3, free -     T4, free -     EKG 12-Lead  Pain of right thumb  Healthcare maintenance -     CBC -     Lipid panel -     Urinalysis, Routine w reflex microscopic  Low TSH level -     TSH -     T3, free -     T4, free  Periodic headache  syndrome, not intractable -     ZOLMitriptan; TAKE 1 TABLET BY MOUTH AS NEEDED FOR MIGRAINE  Dispense: 10 tablet; Refill: 4  External hemorrhoids -     Hydrocortisone (Perianal); Place 1 Application rectally 2 (two) times daily.  Dispense: 30 g; Refill: 0  Labyrinthitis, unspecified laterality -     Meclizine HCl; meclizine 12.5 mg tablet  one tablet every 6-12 hours as needed for dizziness  Dispense: 30 tablet; Refill: 1    Return in about 6 months (around 11/19/2022), or if symptoms worsen or fail to improve.  Follow-up with cardiology is important.  Voltaren gel for suspected arthritis first [facility].  She has follow-up scheduled with her hand doctor.  EKG did show bradycardia with regular rate and rhythm.  Otherwise normal.  Mliss SaxWilliam Alfred Emmry Hinsch, MD

## 2022-05-21 DIAGNOSIS — N760 Acute vaginitis: Secondary | ICD-10-CM | POA: Diagnosis not present

## 2022-05-21 DIAGNOSIS — N959 Unspecified menopausal and perimenopausal disorder: Secondary | ICD-10-CM | POA: Diagnosis not present

## 2022-05-21 DIAGNOSIS — Z1231 Encounter for screening mammogram for malignant neoplasm of breast: Secondary | ICD-10-CM | POA: Diagnosis not present

## 2022-05-21 DIAGNOSIS — Z01419 Encounter for gynecological examination (general) (routine) without abnormal findings: Secondary | ICD-10-CM | POA: Diagnosis not present

## 2022-05-21 LAB — HM MAMMOGRAPHY

## 2022-05-22 ENCOUNTER — Telehealth: Payer: Self-pay | Admitting: Cardiovascular Disease

## 2022-05-22 NOTE — Telephone Encounter (Signed)
Pt c/o medication issue:  1. Name of Medication:   atenolol (TENORMIN) 25 MG tablet   2. How are you currently taking this medication (dosage and times per day)?   As prescribed  3. Are you having a reaction (difficulty breathing--STAT)?   No  4. What is your medication issue?    Patient wants a call back directly from RN Raynelle Fanning to discuss continuing to take this medication.

## 2022-05-22 NOTE — Telephone Encounter (Signed)
I contacted patient, discussed with her as part of a triage call previously, she is a Dr.Croitoru patient.   However, she states that she would like to discuss with Dr.Croitoru about doing something more daily for medication instead of as needed atenolol, she notices the palpitations more when she is rocking her glider, or in a rocking chair. She states the feeling causes her to feel her heart pound and then cough as mentioned previously. She states that she mentioned this to her PCP (note in epic for review) and he mentioned maybe doing a different medication, she mentioned Metoprolol.   I advised I would check with MD.   Also she is checking about her husband seeing Dr.C for a visit, he has an aneurysm and would like to know if he can see her husband as well as he mentioned at one time he would. They were out of town for other visits, but I seen some DOD spots on 04/22 - would you be okay to use these spots for his visit. I will message MD with MRN to review chart.  Thanks!

## 2022-05-25 ENCOUNTER — Encounter: Payer: Self-pay | Admitting: Cardiovascular Disease

## 2022-05-26 ENCOUNTER — Encounter: Payer: Self-pay | Admitting: Family Medicine

## 2022-05-26 ENCOUNTER — Telehealth: Payer: Self-pay | Admitting: Family Medicine

## 2022-05-26 DIAGNOSIS — Z780 Asymptomatic menopausal state: Secondary | ICD-10-CM

## 2022-05-26 NOTE — Telephone Encounter (Signed)
Left vm to return call.    

## 2022-05-26 NOTE — Telephone Encounter (Signed)
Caller Name: Chiara Call back phone #: [phone]  Reason for Call: Pt would like a call to discuss her bone density test

## 2022-05-28 NOTE — Telephone Encounter (Signed)
Patient would like to get a bone density done, last OV was 05/20/22.can you place the order.   Please and thank you.  Dm/cma

## 2022-05-28 NOTE — Addendum Note (Signed)
Addended by: Andrez Grime on: 05/28/2022 12:55 PM   Modules accepted: Orders

## 2022-06-01 ENCOUNTER — Ambulatory Visit: Payer: Medicare PPO | Attending: Cardiovascular Disease | Admitting: Cardiovascular Disease

## 2022-06-01 ENCOUNTER — Telehealth: Payer: Self-pay | Admitting: Family Medicine

## 2022-06-01 ENCOUNTER — Encounter: Payer: Self-pay | Admitting: Cardiovascular Disease

## 2022-06-01 VITALS — BP 126/64 | HR 64 | Ht 61.5 in | Wt 113.0 lb

## 2022-06-01 DIAGNOSIS — R7303 Prediabetes: Secondary | ICD-10-CM | POA: Diagnosis not present

## 2022-06-01 DIAGNOSIS — R002 Palpitations: Secondary | ICD-10-CM | POA: Diagnosis not present

## 2022-06-01 DIAGNOSIS — I491 Atrial premature depolarization: Secondary | ICD-10-CM

## 2022-06-01 DIAGNOSIS — I4949 Other premature depolarization: Secondary | ICD-10-CM

## 2022-06-01 MED ORDER — ATENOLOL 25 MG PO TABS
12.5000 mg | ORAL_TABLET | Freq: Every day | ORAL | 0 refills | Status: DC | PRN
Start: 2022-06-01 — End: 2022-08-03

## 2022-06-01 NOTE — Patient Instructions (Signed)
Medication Instructions:  No changes *If you need a refill on your cardiac medications before your next appointment, please call your pharmacy*  Testing/Procedures: Dr Sallyanne Kuster has ordered a CT coronary calcium score.   Test locations:  [place]   This is $99 out of pocket.   Coronary CalciumScan A coronary calcium scan is an imaging test used to look for deposits of calcium and other fatty materials (plaques) in the inner lining of the blood vessels of the heart (coronary arteries). These deposits of calcium and plaques can partly clog and narrow the coronary arteries without producing any symptoms or warning signs. This puts a person at risk for a heart attack. This test can detect these deposits before symptoms develop. Tell a health care provider about: Any allergies you have. All medicines you are taking, including vitamins, herbs, eye drops, creams, and over-the-counter medicines. Any problems you or family members have had with anesthetic medicines. Any blood disorders you have. Any surgeries you have had. Any medical conditions you have. Whether you are pregnant or may be pregnant. What are the risks? Generally, this is a safe procedure. However, problems may occur, including: Harm to a pregnant woman and her unborn baby. This test involves the use of radiation. Radiation exposure can be dangerous to a pregnant woman and her unborn baby. If you are pregnant, you generally should not have this procedure done. Slight increase in the risk of cancer. This is because of the radiation involved in the test. What happens before the procedure? No preparation is needed for this procedure. What happens during the procedure? You will undress and remove any jewelry around your neck or chest. You will put on a hospital gown. Sticky electrodes will be placed on your chest. The electrodes will be connected to an electrocardiogram (ECG) machine to record a  tracing of the electrical activity of your heart. A CT scanner will take pictures of your heart. During this time, you will be asked to lie still and hold your breath for 2-3 seconds while a picture of your heart is being taken. The procedure may vary among health care providers and hospitals. What happens after the procedure? You can get dressed. You can return to your normal activities. It is up to you to get the results of your test. Ask your health care provider, or the department that is doing the test, when your results will be ready. Summary A coronary calcium scan is an imaging test used to look for deposits of calcium and other fatty materials (plaques) in the inner lining of the blood vessels of the heart (coronary arteries). Generally, this is a safe procedure. Tell your health care provider if you are pregnant or may be pregnant. No preparation is needed for this procedure. A CT scanner will take pictures of your heart. You can return to your normal activities after the scan is done. This information is not intended to replace advice given to you by your health care provider. Make sure you discuss any questions you have with your health care provider. Document Released: 07/25/2007 Document Revised: 12/16/2015 Document Reviewed: 12/16/2015 Elsevier Interactive Patient Education  2017 [place]: At [facility], you and your health needs are our priority.  As part of our continuing mission to provide you with exceptional heart care, we have created designated Provider Care Teams.  These Care Teams include your primary Cardiologist (physician) and Advanced Practice Providers (APPs -  Physician Assistants and  Nurse Practitioners) who all work together to provide you with the care you need, when you need it.  We recommend signing up for the patient portal called "MyChart".  Sign up information is provided on this After Visit Summary.  MyChart is used to connect  with patients for Virtual Visits (Telemedicine).  Patients are able to view lab/test results, encounter notes, upcoming appointments, etc.  Non-urgent messages can be sent to your provider as well.   To learn more about what you can do with MyChart, go to [email or web address].    Your next appointment:    09/21/22 at 1140  Provider:   Thurmon Fair, MD

## 2022-06-01 NOTE — Telephone Encounter (Signed)
Caller Name: Darel Hong Call back phone #: [phone]  Reason for Call: pt said that she's had hacking cough and palpitations since last seen for cpe with Dr. Doreene Burke 05/20/2022. She is requesting Dr. Doreene Burke to order chest xray just to check. Please call to notify pt.

## 2022-06-01 NOTE — Progress Notes (Signed)
Cardiology Consultation Note:    Date:  06/02/2022   ID:  [name], DOB 1950-03-12, MRN [id number]  PCP:  Mliss Sax, MD   [facility]  Cardiologist:  Thurmon Fair, MD NEW (remotely, Dr. Charlies Constable) Advanced Practice Provider:  No care team member to display Electrophysiologist:  None       Referring MD: Mliss Sax,*   Chief Complaint  Patient presents with   Irregular Heart Beat     History of Present Illness:    [name] is a 72 y.o. female with a hx of paroxysmal atrial tachycardia more than 10 years ago, when she saw Dr. Juanda Chance, treated with "as needed" beta blockers.   She continues to have intermittent palpitations.  Usually these occur in the evenings when she is relaxed, sitting in her glide chair.  They do not bother her during physical activity.  She now has an Scientist, physiological.  She submitted several recordings from the device when it suggested possible atrial fibrillation or undetermined rhythm.  Universally these recordings show premature atrial contractions.  Has been no true atrial fibrillation detected so far.  Unclear whether atenolol 12.5 mg once daily, taken as a scheduled medication, has made any difference in the prevalence of the palpitations.  She prefers to continue taking the medications on an "as needed" basis.  She has occasional palpitations. Her phone app (pulse based, no ECG) shows episodes of irregular rhythm.  The patient specifically denies any chest pain at rest exertion, dyspnea at rest or with exertion, orthopnea, paroxysmal nocturnal dyspnea, syncope, sustained palpitations, focal neurological deficits, intermittent claudication, lower extremity edema, unexplained weight gain, cough, hemoptysis or wheezing.  Past medical history is significant for the absence of major chronic illnesses including no diabetes, hypertension, hypercholesterolemia, CAD, PVD, stroke or TIA, smoking.   Favorable  metabolic profile with LDL 84 and HDL 68, normal triglycerides, but with new development of "prediabetes" with a hemoglobin A1c level of 5.9%, despite the fact that she is very lean (BMI 21) and exercises regularly.  In the 1990s she had been told that she has mitral valve prolapse, but this was not confirmed on the most recent study.  She has no evidence of mitral valve prolapse on subsequent imaging studies.  She had a normal stress echo in 2020.  Past Medical History:  Diagnosis Date   Anal fissure    Anemia    during pregnancy   Anxiety    Basal cell carcinoma    DDD (degenerative disc disease)    Diverticulosis    Hypokalemia    LBP (low back pain)    Melanoma    Migraine    MVP (mitral valve prolapse) 1990   psa   Rosacea    Ulcer    as a child    Past Surgical History:  Procedure Laterality Date   ABDOMINAL HYSTERECTOMY  1985   BREAST BIOPSY Left    secondary to infection   I & D EXTREMITY Right 09/09/2012   Procedure: Minor IRRIGATION AND DEBRIDEMENT EXTREMITY paronychia of right thumb;  Surgeon: Nicki Reaper, MD;  Location: [facility];  Service: Orthopedics;  Laterality: Right;   I & D EXTREMITY Left 01/26/2014   Procedure: MINOR IINCISION AND DRAINAGE paronychia left index finger;  Surgeon: Cindee Salt, MD;  Location: [facility];  Service: Orthopedics;  Laterality: Left;  left index   OOPHORECTOMY Left 1994   REPAIR KNEE LIGAMENT Left 1989  TONSILLECTOMY  1955   TUBAL LIGATION     VEIN SURGERY      Current Medications: Current Meds  Medication Sig   Ascorbic Acid (VITAMIN C) 1000 MG tablet SMARTSIG:1 By Mouth   calcium-vitamin D 250-100 MG-UNIT per tablet Take 1 tablet by mouth daily.   clobetasol ointment (TEMOVATE) 0.05 % clobetasol 0.05 % topical ointment  APPLY TO VULVA TWICE WEEKLY   Doxylamine Succinate, Sleep, (SLEEP-AID PO) Take by mouth. [employer] sleep aid   estradiol (ESTRACE) 0.5 MG tablet Take 0.5 mg by mouth daily.    hydrocortisone (ANUSOL-HC) 2.5 % rectal cream Place 1 Application rectally 2 (two) times daily.   L-LYSINE PO Take 1 tablet by mouth 2 (two) times a week.   meclizine (ANTIVERT) 12.5 MG tablet meclizine 12.5 mg tablet  one tablet every 6-12 hours as needed for dizziness   meloxicam (MOBIC) 15 MG tablet TAKE 1 TABLET BY MOUTH AS NEEDED   Multiple Vitamin (MULTIVITAMIN) tablet Take 1 tablet by mouth daily.   Phenyleph-Diphenhyd-Hydrocod (HYDRO-DP PO) Take by mouth. Hydroeye   RESTASIS 0.05 % ophthalmic emulsion    SAMBUCOL BLACK ELDERBERRY PO Take 1 capsule by mouth 2 (two) times a week.   zolmitriptan (ZOMIG-ZMT) 2.5 MG disintegrating tablet TAKE 1 TABLET BY MOUTH AS NEEDED FOR MIGRAINE   [DISCONTINUED] atenolol (TENORMIN) 25 MG tablet Take 0.5 tablets (12.5 mg total) by mouth daily as needed (for palpitations).   [DISCONTINUED] omeprazole (PRILOSEC) 20 MG capsule Take 1 capsule (20 mg total) by mouth daily. AS NEEDED     Allergies:   Codeine, Compazine, Gabapentin, Ondansetron hcl, Other, Prednisone, Prochlorperazine edisylate, Sulfa antibiotics, and Sulfonamide derivatives   Social History   Socioeconomic History   Marital status: Married    Spouse name: Not on file   Number of children: 2   Years of education: Not on file   Highest education level: Not on file  Occupational History   Occupation: retired school system  Tobacco Use   Smoking status: Never   Smokeless tobacco: Never  Vaping Use   Vaping Use: Never used  Substance and Sexual Activity   Alcohol use: Yes    Alcohol/week: 1.0 standard drink of alcohol    Types: 1 Standard drinks or equivalent per week    Comment: social wine   Drug use: No   Sexual activity: Yes  Other Topics Concern   Not on file  Social History Narrative   Not on file   Social Determinants of Health   Financial Resource Strain: Low Risk  (12/16/2021)   Overall Financial Resource Strain (CARDIA)    Difficulty of Paying Living Expenses: Not  hard at all  Food Insecurity: No Food Insecurity (12/16/2021)   Hunger Vital Sign    Worried About Running Out of Food in the Last Year: Never true    Ran Out of Food in the Last Year: Never true  Transportation Needs: No Transportation Needs (12/16/2021)   PRAPARE - Administrator, Civil Service (Medical): No    Lack of Transportation (Non-Medical): No  Physical Activity: Inactive (12/16/2021)   Exercise Vital Sign    Days of Exercise per Week: 0 days    Minutes of Exercise per Session: 0 min  Stress: No Stress Concern Present (12/16/2021)   [employer] of Occupational Health - Occupational Stress Questionnaire    Feeling of Stress : Not at all  Social Connections: Socially Integrated (10/08/2020)   Social Connection and Isolation Panel [NHANES]  Frequency of Communication with Friends and Family: More than three times a week    Frequency of Social Gatherings with Friends and Family: More than three times a week    Attends Religious Services: More than 4 times per year    Active Member of [employer] or Organizations: Yes    Attends Banker Meetings: 1 to 4 times per year    Marital Status: Married     Family History: The patient's family history includes Colitis in her father; Colon polyps in her father and mother; Esophageal cancer in her maternal aunt; Heart disease in her father and mother. There is no history of Colon cancer.  ROS:   Please see the history of present illness.     All other systems reviewed and are negative.  EKGs/Labs/Other Studies Reviewed:    The following studies were reviewed today: Echo 2018, stress echo 2020 Multiple self recorded rhythm strips from her smart watch.  EKG:  EKG is  ordered today.  Normal sinus rhythm, normal tracing, QTc 412 ms  Recent Labs: 05/20/2022: ALT 13; BUN 16; Creatinine, Ser 0.70; Hemoglobin 13.3; Platelets 312.0; Potassium 3.9; Sodium 141; TSH 1.10  Recent Lipid Panel    Component Value Date/Time    CHOL 175 05/20/2022 0918   TRIG 111.0 05/20/2022 0918   HDL 68.30 05/20/2022 0918   CHOLHDL 3 05/20/2022 0918   VLDL 22.2 05/20/2022 0918   LDLCALC 84 05/20/2022 0918     Risk Assessment/Calculations:       Physical Exam:    VS:  BP 126/64   Pulse 64   Ht 5' 1.5" (1.562 m)   Wt 113 lb (51.3 kg)   SpO2 99%   BMI 21.01 kg/m     Wt Readings from Last 3 Encounters:  06/01/22 113 lb (51.3 kg)  05/20/22 113 lb (51.3 kg)  12/16/21 110 lb (49.9 kg)      General: Alert, oriented x3, no distress, appears lean and fit, younger than stated age Head: no evidence of trauma, PERRL, EOMI, no exophtalmos or lid lag, no myxedema, no xanthelasma; normal ears, nose and oropharynx Neck: normal jugular venous pulsations and no hepatojugular reflux; brisk carotid pulses without delay and no carotid bruits Chest: clear to auscultation, no signs of consolidation by percussion or palpation, normal fremitus, symmetrical and full respiratory excursions Cardiovascular: normal position and quality of the apical impulse, regular rhythm, normal first and second heart sounds, no murmurs, rubs or gallops Abdomen: no tenderness or distention, no masses by palpation, no abnormal pulsatility or arterial bruits, normal bowel sounds, no hepatosplenomegaly Extremities: no clubbing, cyanosis or edema; 2+ radial, ulnar and brachial pulses bilaterally; 2+ right femoral, posterior tibial and dorsalis pedis pulses; 2+ left femoral, posterior tibial and dorsalis pedis pulses; no subclavian or femoral bruits Neurological: grossly nonfocal Psych: Normal mood and affect    ASSESSMENT:    1. Prediabetes   2. Palpitations   3. Premature beat      PLAN:    In order of problems listed above:   Palpitations: Her Apple Watch consistently shows that her palpitations are related to isolated premature atrial contractions, sometimes occurring a few times a minute.  No atrial fibrillation or atrial tachycardia has been  detected.  Offered to switch from atenolol to an alternative agent such as a central acting calcium channel blocker, but she would rather stick with the current treatment for the time being. PreDM: She eats a pretty healthy diet and is lean.  Discussed the  concept of the "glycemic index".  She has been eating a lot of grapes.  Suggest the eating fruits with lower sugar concentration such as berries or apples which have pectin that delays absorption of glucose.  Avoid highly refined starches.  Prefer whole-grain complex carbohydrates. Despite her good lipid profile she is interested in screening for coronary disease risk.  Will schedule her for a noncontrast CT coronary calcium score.       Medication Adjustments/Labs and Tests Ordered: Current medicines are reviewed at length with the patient today.  Concerns regarding medicines are outlined above.  Orders Placed This Encounter  Procedures   CT CARDIAC SCORING (SELF PAY ONLY)   EKG 12-Lead   Meds ordered this encounter  Medications   atenolol (TENORMIN) 25 MG tablet    Sig: Take 0.5 tablets (12.5 mg total) by mouth daily as needed (for palpitations).    Dispense:  30 tablet    Refill:  0    Patient Instructions  Medication Instructions:  No changes *If you need a refill on your cardiac medications before your next appointment, please call your pharmacy*  Testing/Procedures: Dr Royann Shivers has ordered a CT coronary calcium score.   Test locations:  [facility]   This is $99 out of pocket.   Coronary CalciumScan A coronary calcium scan is an imaging test used to look for deposits of calcium and other fatty materials (plaques) in the inner lining of the blood vessels of the heart (coronary arteries). These deposits of calcium and plaques can partly clog and narrow the coronary arteries without producing any symptoms or warning signs. This puts a person at risk for a heart attack. This test can detect  these deposits before symptoms develop. Tell a health care provider about: Any allergies you have. All medicines you are taking, including vitamins, herbs, eye drops, creams, and over-the-counter medicines. Any problems you or family members have had with anesthetic medicines. Any blood disorders you have. Any surgeries you have had. Any medical conditions you have. Whether you are pregnant or may be pregnant. What are the risks? Generally, this is a safe procedure. However, problems may occur, including: Harm to a pregnant woman and her unborn baby. This test involves the use of radiation. Radiation exposure can be dangerous to a pregnant woman and her unborn baby. If you are pregnant, you generally should not have this procedure done. Slight increase in the risk of cancer. This is because of the radiation involved in the test. What happens before the procedure? No preparation is needed for this procedure. What happens during the procedure? You will undress and remove any jewelry around your neck or chest. You will put on a hospital gown. Sticky electrodes will be placed on your chest. The electrodes will be connected to an electrocardiogram (ECG) machine to record a tracing of the electrical activity of your heart. A CT scanner will take pictures of your heart. During this time, you will be asked to lie still and hold your breath for 2-3 seconds while a picture of your heart is being taken. The procedure may vary among health care providers and hospitals. What happens after the procedure? You can get dressed. You can return to your normal activities. It is up to you to get the results of your test. Ask your health care provider, or the department that is doing the test, when your results will be ready. Summary A coronary calcium scan is an imaging test used to look for deposits  of calcium and other fatty materials (plaques) in the inner lining of the blood vessels of the heart (coronary  arteries). Generally, this is a safe procedure. Tell your health care provider if you are pregnant or may be pregnant. No preparation is needed for this procedure. A CT scanner will take pictures of your heart. You can return to your normal activities after the scan is done. This information is not intended to replace advice given to you by your health care provider. Make sure you discuss any questions you have with your health care provider. Document Released: 07/25/2007 Document Revised: 12/16/2015 Document Reviewed: 12/16/2015 Elsevier Interactive Patient Education  2017 [employer].    Follow-Up: At [facility], you and your health needs are our priority.  As part of our continuing mission to provide you with exceptional heart care, we have created designated Provider Care Teams.  These Care Teams include your primary Cardiologist (physician) and Advanced Practice Providers (APPs -  Physician Assistants and Nurse Practitioners) who all work together to provide you with the care you need, when you need it.  We recommend signing up for the patient portal called "MyChart".  Sign up information is provided on this After Visit Summary.  MyChart is used to connect with patients for Virtual Visits (Telemedicine).  Patients are able to view lab/test results, encounter notes, upcoming appointments, etc.  Non-urgent messages can be sent to your provider as well.   To learn more about what you can do with MyChart, go to [email or web address].    Your next appointment:    09/21/22 at 1140  Provider:   Thurmon Fair, MD       Signed, Thurmon Fair, MD  06/02/2022 9:14 AM    [facility]

## 2022-06-02 ENCOUNTER — Encounter: Payer: Self-pay | Admitting: Cardiovascular Disease

## 2022-06-02 NOTE — Telephone Encounter (Signed)
Spoke to patient, she states that she had gone to the beach, has a cough that happens with heart palpitations. She did see her cardiologist and they have her scheduled for some tests.  Advised for her to make an appointment to come in if cough gets bothersome so we can evaluate it.   She is agreeable and states that she will call us back if it gets worse. Dm/cma

## 2022-06-05 ENCOUNTER — Ambulatory Visit
Admission: RE | Admit: 2022-06-05 | Discharge: 2022-06-05 | Disposition: A | Discharge status: Home / Self Care | Payer: Medicare PPO | Source: Ambulatory Visit | Attending: Family Medicine | Admitting: Family Medicine

## 2022-06-05 ENCOUNTER — Other Ambulatory Visit: Payer: Self-pay | Admitting: Cardiovascular Disease

## 2022-06-05 DIAGNOSIS — Z780 Asymptomatic menopausal state: Secondary | ICD-10-CM

## 2022-06-05 DIAGNOSIS — M85852 Other specified disorders of bone density and structure, left thigh: Secondary | ICD-10-CM | POA: Diagnosis not present

## 2022-06-05 DIAGNOSIS — I491 Atrial premature depolarization: Secondary | ICD-10-CM

## 2022-06-05 DIAGNOSIS — R002 Palpitations: Secondary | ICD-10-CM

## 2022-06-15 NOTE — Telephone Encounter (Signed)
Pt is wanting a cb concerning this issue. Please advise pt at   [phone]

## 2022-06-15 NOTE — Telephone Encounter (Signed)
Spoke with patient who states that the cough have improved patient wanting to schedule recommended appointment to discuss bone scan. Appointment scheduled

## 2022-06-19 DIAGNOSIS — H10823 Rosacea conjunctivitis, bilateral: Secondary | ICD-10-CM | POA: Diagnosis not present

## 2022-06-19 DIAGNOSIS — H2513 Age-related nuclear cataract, bilateral: Secondary | ICD-10-CM | POA: Diagnosis not present

## 2022-06-19 DIAGNOSIS — H04123 Dry eye syndrome of bilateral lacrimal glands: Secondary | ICD-10-CM | POA: Diagnosis not present

## 2022-06-19 DIAGNOSIS — H00014 Hordeolum externum left upper eyelid: Secondary | ICD-10-CM | POA: Diagnosis not present

## 2022-06-22 ENCOUNTER — Other Ambulatory Visit (HOSPITAL_COMMUNITY): Payer: Medicare PPO

## 2022-06-23 ENCOUNTER — Ambulatory Visit: Payer: Medicare PPO | Admitting: Family Medicine

## 2022-06-23 ENCOUNTER — Encounter: Payer: Self-pay | Admitting: Family Medicine

## 2022-06-23 VITALS — BP 118/64 | HR 81 | Temp 97.2°F | Ht 61.0 in | Wt 114.0 lb

## 2022-06-23 DIAGNOSIS — M720 Palmar fascial fibromatosis [Dupuytren]: Secondary | ICD-10-CM

## 2022-06-23 DIAGNOSIS — M85851 Other specified disorders of bone density and structure, right thigh: Secondary | ICD-10-CM | POA: Diagnosis not present

## 2022-06-23 DIAGNOSIS — M85852 Other specified disorders of bone density and structure, left thigh: Secondary | ICD-10-CM | POA: Diagnosis not present

## 2022-06-23 DIAGNOSIS — R7303 Prediabetes: Secondary | ICD-10-CM | POA: Diagnosis not present

## 2022-06-23 MED ORDER — METFORMIN HCL ER 500 MG PO TB24
500.0000 mg | ORAL_TABLET | Freq: Every evening | ORAL | 1 refills | Status: DC
Start: 2022-06-23 — End: 2022-08-04

## 2022-06-23 NOTE — Progress Notes (Signed)
Established Patient Office Visit   Subjective:  Patient ID: [name], female    DOB: 11-13-1950  Age: 72 y.o. MRN: [id number]  Chief Complaint  Patient presents with   Advice Only    Discuss bone scan, and cardiologist appointment. Referral to PT for hands.     HPI Encounter Diagnoses  Name Primary?   Prediabetes Yes   Osteopenia of necks of both femurs    Dupuytren's contracture of both hands    Will follow-up on the above.  Coronary calcium scoring is pending for her.   Review of Systems  Constitutional: Negative.   HENT: Negative.    Eyes:  Negative for blurred vision, discharge and redness.  Respiratory: Negative.    Cardiovascular: Negative.   Gastrointestinal:  Negative for abdominal pain.  Genitourinary: Negative.   Musculoskeletal: Negative.  Negative for myalgias.  Skin:  Negative for rash.  Neurological:  Negative for tingling, loss of consciousness and weakness.  Endo/Heme/Allergies:  Negative for polydipsia.     Current Outpatient Medications:    Ascorbic Acid (VITAMIN C) 1000 MG tablet, SMARTSIG:1 By Mouth, Disp: , Rfl:    atenolol (TENORMIN) 25 MG tablet, Take 0.5 tablets (12.5 mg total) by mouth daily as needed (for palpitations)., Disp: 30 tablet, Rfl: 0   calcium-vitamin D 250-100 MG-UNIT per tablet, Take 1 tablet by mouth daily., Disp: , Rfl:    clobetasol ointment (TEMOVATE) 0.05 %, clobetasol 0.05 % topical ointment  APPLY TO VULVA TWICE WEEKLY, Disp: , Rfl:    Doxylamine Succinate, Sleep, (SLEEP-AID PO), Take by mouth. [employer] sleep aid, Disp: , Rfl:    erythromycin ophthalmic ointment, Place 1 Application into the left eye at bedtime., Disp: , Rfl:    estradiol (ESTRACE) 0.5 MG tablet, Take 0.5 mg by mouth daily., Disp: , Rfl:    hydrocortisone (ANUSOL-HC) 2.5 % rectal cream, Place 1 Application rectally 2 (two) times daily., Disp: 30 g, Rfl: 0   L-LYSINE PO, Take 1 tablet by mouth 2 (two) times a week., Disp: , Rfl:    meclizine (ANTIVERT) 12.5  MG tablet, meclizine 12.5 mg tablet  one tablet every 6-12 hours as needed for dizziness, Disp: 30 tablet, Rfl: 1   meloxicam (MOBIC) 15 MG tablet, TAKE 1 TABLET BY MOUTH AS NEEDED, Disp: 30 tablet, Rfl: 1   metFORMIN (GLUCOPHAGE-XR) 500 MG 24 hr tablet, Take 1 tablet (500 mg total) by mouth at bedtime., Disp: 90 tablet, Rfl: 1   Multiple Vitamin (MULTIVITAMIN) tablet, Take 1 tablet by mouth daily., Disp: , Rfl:    Phenyleph-Diphenhyd-Hydrocod (HYDRO-DP PO), Take by mouth. Hydroeye, Disp: , Rfl:    RESTASIS 0.05 % ophthalmic emulsion, , Disp: , Rfl:    SAMBUCOL BLACK ELDERBERRY PO, Take 1 capsule by mouth 2 (two) times a week., Disp: , Rfl:    zolmitriptan (ZOMIG-ZMT) 2.5 MG disintegrating tablet, TAKE 1 TABLET BY MOUTH AS NEEDED FOR MIGRAINE, Disp: 10 tablet, Rfl: 4   Objective:     BP 118/64 (BP Location: Right Arm, Patient Position: Sitting, Cuff Size: Normal)   Pulse 81   Temp (!) 97.2 F (36.2 C) (Temporal)   Ht 5\' 1"  (1.549 m)   Wt 114 lb (51.7 kg)   SpO2 97%   BMI 21.54 kg/m    Physical Exam Constitutional:      General: She is not in acute distress.    Appearance: Normal appearance. She is not ill-appearing, toxic-appearing or diaphoretic.  HENT:     Head: Normocephalic and atraumatic.  Right Ear: External ear normal.     Left Ear: External ear normal.  Eyes:     General: No scleral icterus.       Right eye: No discharge.        Left eye: No discharge.     Extraocular Movements: Extraocular movements intact.     Conjunctiva/sclera: Conjunctivae normal.  Pulmonary:     Effort: Pulmonary effort is normal. No respiratory distress.  Skin:    General: Skin is warm and dry.  Neurological:     Mental Status: She is alert and oriented to person, place, and time.  Psychiatric:        Mood and Affect: Mood normal.        Behavior: Behavior normal.      No results found for any visits on 06/23/22.    The 10-year ASCVD risk score (Arnett DK, et al., 2019) is:  10.8%    Assessment & Plan:   Prediabetes -     metFORMIN HCl ER; Take 1 tablet (500 mg total) by mouth at bedtime.  Dispense: 90 tablet; Refill: 1  Osteopenia of necks of both femurs  Dupuytren's contracture of both hands -     Ambulatory referral to Physical Therapy    Return in about 3 months (around 09/23/2022).  Agrees to try metformin at at bedtime for prediabetes.  Warned about abdominal cramping with loose stools that should pass after a few weeks.  She will let me know if it does not.  Physical therapy for Dupuytren's contractures.  Advised that she qualifies for treatment for osteoporosis with the osteopenia in her femoral necks.  She would like to focus on 1200 mg of calcium with 800 international units of vitamin D daily.  She would like to avoid treatment for now.  She was given information on Dupuytren's contracture, osteopenia and prediabetes.  Mliss Sax, MD

## 2022-06-24 NOTE — Addendum Note (Signed)
Addended by: Andrez Grime on: 06/24/2022 08:06 AM   Modules accepted: Orders

## 2022-06-25 ENCOUNTER — Telehealth: Payer: Self-pay | Admitting: Family Medicine

## 2022-06-25 DIAGNOSIS — M720 Palmar fascial fibromatosis [Dupuytren]: Secondary | ICD-10-CM

## 2022-06-25 NOTE — Telephone Encounter (Signed)
[name] [phone]    Pt called and would like for her OT referral to be sent to [facility] to the OT [place]. This is closer to her home and they can see her sooner.

## 2022-06-26 NOTE — Telephone Encounter (Signed)
Referral mvoved

## 2022-07-12 NOTE — Therapy (Signed)
OUTPATIENT OCCUPATIONAL THERAPY ORTHO EVALUATION  Patient Name: [name] MRN: [id number] DOB:[birth year], 72 y.o., female Today's Date: 07/13/2022  PCP: Dr. Doreene Burke REFERRING PROVIDER: Dr. Doreene Burke  END OF SESSION:  OT End of Session - 07/13/22 1224     Visit Number 1    Number of Visits 12    Date for OT Re-Evaluation 10/05/22    Authorization Type [employer]    Authorization Time Period 90 days    Authorization - Visit Number 1    Authorization - Number of Visits 10    Progress Note Due on Visit 10    OT Start Time 0848    OT Stop Time 0930    OT Time Calculation (min) 42 min    Activity Tolerance Patient tolerated treatment well    Behavior During Therapy WFL for tasks assessed/performed             Past Medical History:  Diagnosis Date   Anal fissure    Anemia    during pregnancy   Anxiety    Basal cell carcinoma    DDD (degenerative disc disease)    Diverticulosis    Hypokalemia    LBP (low back pain)    Melanoma (HCC)    Migraine    MVP (mitral valve prolapse) 1990   psa   Rosacea    Ulcer    as a child   Past Surgical History:  Procedure Laterality Date   ABDOMINAL HYSTERECTOMY  1985   BREAST BIOPSY Left    secondary to infection   I & D EXTREMITY Right 09/09/2012   Procedure: Minor IRRIGATION AND DEBRIDEMENT EXTREMITY paronychia of right thumb;  Surgeon: Nicki Reaper, MD;  Location: [facility];  Service: Orthopedics;  Laterality: Right;   I & D EXTREMITY Left 01/26/2014   Procedure: MINOR IINCISION AND DRAINAGE paronychia left index finger;  Surgeon: Cindee Salt, MD;  Location: [facility];  Service: Orthopedics;  Laterality: Left;  left index   OOPHORECTOMY Left 1994   REPAIR KNEE LIGAMENT Left 1989   TONSILLECTOMY  1955   TUBAL LIGATION     VEIN SURGERY     Patient Active Problem List   Diagnosis Date Noted   Osteopenia of necks of both femurs 06/23/2022   Dupuytren's contracture of both hands 06/23/2022    Low TSH level 05/20/2022   Prediabetes 05/20/2022   Cellulitis of finger of right hand 09/08/2021   Infection of skin 05/13/2021   Cervicalgia 05/06/2021   Grieving 05/06/2021   Conductive hearing loss of right ear 03/04/2021   Impacted cerumen of right ear 03/04/2021   Medication side effect 03/03/2021   Excessive cerumen in right ear canal 03/03/2021   Labyrinthitis 07/30/2020   Palpitations 04/15/2020   Plantar fasciitis 04/15/2020   Dysthymia 04/15/2020   Vitamin D deficiency 04/15/2020   Gastroesophageal reflux disease 04/15/2020   Insect bite of left foot 11/24/2019   Pustule 11/24/2019   Pain of right thumb 03/09/2019   External hemorrhoids 03/03/2018   Other chest pain 02/23/2018   Insomnia 02/24/2017   Healthcare maintenance 02/24/2017   Conjunctivitis 03/01/2014   Seborrheic keratosis, inflamed 01/05/2012   Menopausal syndrome 12/22/2011   Encounter for counseling 11/11/2011   Skin cancer 07/14/2011   Basal cell carcinoma of face 03/10/2011   Migraine 05/14/2007   IRRITABLE BOWEL SYNDROME 05/14/2007   History of mitral valve prolapse 05/14/2007   Allergic rhinitis 04/07/2007   DERMATITIS DUE TO SOLAR RADIATION 04/07/2007  Premature beat 09/20/2006   DIVERTICULOSIS, COLON 04/13/2002    ONSET DATE: 06/25/22  REFERRING DIAG: M72.0 (ICD-10-CM) - Dupuytren's contracture of both hands   THERAPY DIAG:  Stiffness of left hand, not elsewhere classified  Stiffness of right hand, not elsewhere classified  Muscle weakness (generalized)  Pain in joint of left hand  Pain in joint of right hand  Rationale for Evaluation and Treatment: Rehabilitation  SUBJECTIVE:   SUBJECTIVE STATEMENT: Pt reports hand stiffness Pt accompanied by: self  PERTINENT HISTORY: Dupuytren's contracture of both hands, osteopenia recently diagnosed, see active problem list above  PRECAUTIONS: None  WEIGHT BEARING RESTRICTIONS: No  PAIN:  Are you having pain? Yes: NPRS scale: 3/10  at [facility] 8/10 with use Pain location: bilateral thumb CMC joint Pain description: aching  Aggravating factors: overuse Relieving factors: heat, paraffin, rest  FALLS: Has patient fallen in last 6 months? No  LIVING ENVIRONMENT: Lives with: lives with their spouse   PLOF: Independent  PATIENT GOALS: prevent further tightness in hands    OBJECTIVE:   HAND DOMINANCE: Right  ADLs: Overall ADLs:  independent with all basic ADLs Transfers/ambulation related to ADLs: independent   FUNCTIONAL OUTCOME MEASURES: Upper Extremity Functional Scale (UEFS): 56/80=70%, with 30% limitation  UPPER EXTREMITY ROM:   Pt demonstrates full composite finger flexion and extension bilaterally     Active ROM Right eval Left eval  Thumb MCP (0-60) 40 50  Thumb IP (0-80) 75 80  Thumb Radial abd/add (0-55) 65  75  Thumb Palmar abd/add (0-45)     Thumb Opposition to Small Finger WFL WFL  Index MCP (0-90)     Index PIP (0-100)     Index DIP (0-70)      Long MCP (0-90)      Long PIP (0-100)      Long DIP (0-70)      Ring MCP (0-90)      Ring PIP (0-100)      Ring DIP (0-70)      Little MCP (0-90)      Little PIP (0-100)      Little DIP (0-70)      (Blank rows = not tested)    HAND FUNCTION: Grip strength: Right: 33 lbs; Left: 35 lbs    SENSATION: WFL  Observations: cording noted in R palm > than left particularly at middle and ring fingers right hand     TODAY'S TREATMENT:                                                                                                                              DATE: 07/13/22 Paraffin to bilateral UE's x 8 mins for stiffness and pain, no adverse reactions. Pt was instructed in A/ROM exercises from [place] protocol 6, 7, 8, 9, 10  for bilateral UE's min-mod v.c and demonstration   PATIENT EDUCATION: Education details: Pt was instructed in A/ROM exercises from [place] protocol 6, 7, 8, 9, 10, role of  OT, potential goals Person educated:  Patient Education method: Explanation, Demonstration, Tactile cues, Verbal cues, and Handouts Education comprehension: verbalized understanding, returned demonstration, and verbal cues required  HOME EXERCISE PROGRAM: [place] protocol 6, 7, 8, 9, 10 for dupetryn's  GOALS: Goals reviewed with patient? Yes  SHORT TERM GOALS: Target date: 08/11/22  I with initial HEP Baseline: Goal status: INITIAL  2.  I with positioning, including splint wear to minimize pain and risk for contractures Baseline:  Goal status: INITIAL    LONG TERM GOALS: Target date: 10/05/22  I with updated HEP Baseline:  Goal status: INITIAL  2.  I with AE and activity modification to minimize pain and  increase safety and independence with ADLs/IADLS. Baseline:  Goal status: INITIAL  3.  Pt will demonstrate improved UE function as evidenced by improving her UEFS score by 4 points. Baseline:  Goal status: INITIAL   CLINICAL IMPRESSION: Patient is a 72 y.o. female who was seen today for occupational therapy evaluation for M72.0 (ICD-10-CM) - Dupuytren's contracture of both hands . Pt also reports pain at bilateral thumb CMC joints and she was recently diagnosed with osteopenia.  PERFORMANCE DEFICITS: in functional skills including ADLs, IADLs, coordination, dexterity, ROM, strength, pain, fascial restrictions, flexibility, decreased knowledge of precautions, decreased knowledge of use of DME, and UE functional use, cognitive skills including , and psychosocial skills including coping strategies, environmental adaptation, habits, interpersonal interactions, and routines and behaviors.   IMPAIRMENTS: are limiting patient from ADLs, IADLs, play, leisure, and social participation.   COMORBIDITIES: may have co-morbidities  that affects occupational performance. Patient will benefit from skilled OT to address above impairments and improve overall function.  MODIFICATION OR ASSISTANCE TO COMPLETE EVALUATION: No  modification of tasks or assist necessary to complete an evaluation.  OT OCCUPATIONAL PROFILE AND HISTORY: Detailed assessment: Review of records and additional review of physical, cognitive, psychosocial history related to current functional performance.  CLINICAL DECISION MAKING: LOW - limited treatment options, no task modification necessary  REHAB POTENTIAL: Good  EVALUATION COMPLEXITY: Low      PLAN:  OT FREQUENCY: 1x/week  OT DURATION: 12 weeks  PLANNED INTERVENTIONS: self care/ADL training, therapeutic exercise, therapeutic activity, neuromuscular re-education, manual therapy, scar mobilization, passive range of motion, splinting, electrical stimulation, ultrasound, iontophoresis, paraffin, fluidotherapy, moist heat, cryotherapy, contrast bath, patient/family education, energy conservation, coping strategies training, DME and/or AE instructions, and Re-evaluation  RECOMMENDED OTHER SERVICES: none  CONSULTED AND AGREED WITH PLAN OF CARE: Patient  PLAN FOR NEXT SESSION: add to HEP, consider [place]   Oziel Beitler, OT 07/13/2022, 2:26 PM

## 2022-07-13 ENCOUNTER — Ambulatory Visit (HOSPITAL_COMMUNITY)
Admission: RE | Admit: 2022-07-13 | Discharge: 2022-07-13 | Disposition: A | Discharge status: Home / Self Care | Payer: Medicare PPO | Source: Ambulatory Visit | Attending: Cardiovascular Disease | Admitting: Cardiovascular Disease

## 2022-07-13 ENCOUNTER — Encounter (HOSPITAL_COMMUNITY): Payer: Self-pay

## 2022-07-13 ENCOUNTER — Ambulatory Visit: Payer: Medicare PPO | Attending: Family Medicine | Admitting: Occupational Therapy

## 2022-07-13 DIAGNOSIS — M25642 Stiffness of left hand, not elsewhere classified: Secondary | ICD-10-CM | POA: Insufficient documentation

## 2022-07-13 DIAGNOSIS — M25542 Pain in joints of left hand: Secondary | ICD-10-CM | POA: Diagnosis not present

## 2022-07-13 DIAGNOSIS — M720 Palmar fascial fibromatosis [Dupuytren]: Secondary | ICD-10-CM | POA: Diagnosis not present

## 2022-07-13 DIAGNOSIS — M25541 Pain in joints of right hand: Secondary | ICD-10-CM | POA: Diagnosis not present

## 2022-07-13 DIAGNOSIS — R7303 Prediabetes: Secondary | ICD-10-CM | POA: Insufficient documentation

## 2022-07-13 DIAGNOSIS — M6281 Muscle weakness (generalized): Secondary | ICD-10-CM | POA: Insufficient documentation

## 2022-07-13 DIAGNOSIS — M25641 Stiffness of right hand, not elsewhere classified: Secondary | ICD-10-CM | POA: Insufficient documentation

## 2022-07-26 ENCOUNTER — Ambulatory Visit
Admission: RE | Admit: 2022-07-26 | Discharge: 2022-07-26 | Disposition: A | Discharge status: Home / Self Care | Payer: Medicare PPO | Source: Ambulatory Visit | Attending: Nurse Practitioner | Admitting: Nurse Practitioner

## 2022-07-26 VITALS — BP 131/69 | HR 69 | Temp 97.4°F | Resp 16

## 2022-07-26 DIAGNOSIS — S60561A Insect bite (nonvenomous) of right hand, initial encounter: Secondary | ICD-10-CM

## 2022-07-26 DIAGNOSIS — W57XXXA Bitten or stung by nonvenomous insect and other nonvenomous arthropods, initial encounter: Secondary | ICD-10-CM | POA: Diagnosis not present

## 2022-07-26 DIAGNOSIS — T63481A Toxic effect of venom of other arthropod, accidental (unintentional), initial encounter: Secondary | ICD-10-CM

## 2022-07-26 MED ORDER — CEPHALEXIN 500 MG PO CAPS
500.0000 mg | ORAL_CAPSULE | Freq: Three times a day (TID) | ORAL | 0 refills | Status: AC
Start: 2022-07-26 — End: 2022-08-02

## 2022-07-26 NOTE — ED Provider Notes (Addendum)
[facility]    CSN: [id number] Arrival date & time: 07/26/22  1013      History   Chief Complaint Chief Complaint  Patient presents with   Allergic Reaction    Ant bites. - Entered by patient    HPI [name] is a 72 y.o. female presents for evaluation of an insect bite.  Patient reports 2 days ago [place] she was bitten on her right hand a couple times by some ants.  She reports she had some swelling with redness and warmth along the proximal thumb and first finger that extended into the dorsum of her hand.  She has been doing topical Benadryl ointment, doing Epsom salt soaks and baking soda soaks as well as taking oral Benadryl.  She states that has improved but  she is going out of town and is concerned there may an infection.  Denies any fevers or chills.  No other concerns at this time.   Allergic Reaction   Past Medical History:  Diagnosis Date   Anal fissure    Anemia    during pregnancy   Anxiety    Basal cell carcinoma    DDD (degenerative disc disease)    Diverticulosis    Hypokalemia    LBP (low back pain)    Melanoma (HCC)    Migraine    MVP (mitral valve prolapse) 1990   psa   Rosacea    Ulcer    as a child    Patient Active Problem List   Diagnosis Date Noted   Osteopenia of necks of both femurs 06/23/2022   Dupuytren's contracture of both hands 06/23/2022   Low TSH level 05/20/2022   Prediabetes 05/20/2022   Cellulitis of finger of right hand 09/08/2021   Infection of skin 05/13/2021   Cervicalgia 05/06/2021   Grieving 05/06/2021   Conductive hearing loss of right ear 03/04/2021   Impacted cerumen of right ear 03/04/2021   Medication side effect 03/03/2021   Excessive cerumen in right ear canal 03/03/2021   Labyrinthitis 07/30/2020   Palpitations 04/15/2020   Plantar fasciitis 04/15/2020   Dysthymia 04/15/2020   Vitamin D deficiency 04/15/2020   Gastroesophageal reflux disease 04/15/2020    Insect bite of left foot 11/24/2019   Pustule 11/24/2019   Pain of right thumb 03/09/2019   External hemorrhoids 03/03/2018   Other chest pain 02/23/2018   Insomnia 02/24/2017   Healthcare maintenance 02/24/2017   Conjunctivitis 03/01/2014   Seborrheic keratosis, inflamed 01/05/2012   Menopausal syndrome 12/22/2011   Encounter for counseling 11/11/2011   Skin cancer 07/14/2011   Basal cell carcinoma of face 03/10/2011   Migraine 05/14/2007   IRRITABLE BOWEL SYNDROME 05/14/2007   History of mitral valve prolapse 05/14/2007   Allergic rhinitis 04/07/2007   DERMATITIS DUE TO SOLAR RADIATION 04/07/2007   Premature beat 09/20/2006   DIVERTICULOSIS, COLON 04/13/2002    Past Surgical History:  Procedure Laterality Date   ABDOMINAL HYSTERECTOMY  1985   BREAST BIOPSY Left    secondary to infection   I & D EXTREMITY Right 09/09/2012   Procedure: Minor IRRIGATION AND DEBRIDEMENT EXTREMITY paronychia of right thumb;  Surgeon: Nicki Reaper, MD;  Location: [facility];  Service: Orthopedics;  Laterality: Right;   I & D EXTREMITY Left 01/26/2014   Procedure: MINOR IINCISION AND DRAINAGE paronychia left index finger;  Surgeon: Cindee Salt, MD;  Location: [facility];  Service: Orthopedics;  Laterality: Left;  left  index   OOPHORECTOMY Left 1994   REPAIR KNEE LIGAMENT Left 1989   TONSILLECTOMY  1955   TUBAL LIGATION     VEIN SURGERY      OB History   No obstetric history on file.      Home Medications    Prior to Admission medications   Medication Sig Start Date End Date Taking? Authorizing Provider  cephALEXin (KEFLEX) 500 MG capsule Take 1 capsule (500 mg total) by mouth 3 (three) times daily for 7 days. 07/26/22 08/02/22 Yes Radford Pax, NP  Ascorbic Acid (VITAMIN C) 1000 MG tablet SMARTSIG:1 By Mouth 04/02/21   [provider]  atenolol (TENORMIN) 25 MG tablet Take 0.5 tablets (12.5 mg total) by mouth daily as needed (for palpitations). 06/01/22    Croitoru, Mihai, MD  calcium-vitamin D 250-100 MG-UNIT per tablet Take 1 tablet by mouth daily.    [provider]  clobetasol ointment (TEMOVATE) 0.05 % clobetasol 0.05 % topical ointment  APPLY TO VULVA TWICE WEEKLY    [provider]  Doxylamine Succinate, Sleep, (SLEEP-AID PO) Take by mouth. [facility] sleep aid    [provider]  erythromycin ophthalmic ointment Place 1 Application into the left eye at bedtime. 06/19/22   [provider]  estradiol (ESTRACE) 0.5 MG tablet Take 0.5 mg by mouth daily.    [provider]  hydrocortisone (ANUSOL-HC) 2.5 % rectal cream Place 1 Application rectally 2 (two) times daily. 05/20/22   Mliss Sax, MD  L-LYSINE PO Take 1 tablet by mouth 2 (two) times a week.    [provider]  meclizine (ANTIVERT) 12.5 MG tablet meclizine 12.5 mg tablet  one tablet every 6-12 hours as needed for dizziness 05/20/22   Mliss Sax, MD  meloxicam (MOBIC) 15 MG tablet TAKE 1 TABLET BY MOUTH AS NEEDED 07/08/21   Mliss Sax, MD  metFORMIN (GLUCOPHAGE-XR) 500 MG 24 hr tablet Take 1 tablet (500 mg total) by mouth at bedtime. 06/23/22   Mliss Sax, MD  Multiple Vitamin (MULTIVITAMIN) tablet Take 1 tablet by mouth daily.    [provider]  Phenyleph-Diphenhyd-Hydrocod (HYDRO-DP PO) Take by mouth. Hydroeye    [provider]  RESTASIS 0.05 % ophthalmic emulsion  04/02/21   [provider]  SAMBUCOL BLACK ELDERBERRY PO Take 1 capsule by mouth 2 (two) times a week.    [provider]  zolmitriptan (ZOMIG-ZMT) 2.5 MG disintegrating tablet TAKE 1 TABLET BY MOUTH AS NEEDED FOR MIGRAINE 05/20/22   Mliss Sax, MD    Family History Family History  Problem Relation Age of Onset   Heart disease Mother    Colon polyps Mother    Heart disease Father    Colitis Father    Colon polyps Father    Esophageal cancer Maternal Aunt    Colon cancer Neg Hx      Social History Social History   Tobacco Use   Smoking status: Never   Smokeless tobacco: Never  Vaping Use   Vaping Use: Never used  Substance Use Topics   Alcohol use: Yes    Alcohol/week: 1.0 standard drink of alcohol    Types: 1 Standard drinks or equivalent per week    Comment: social wine   Drug use: No     Allergies   Codeine, Compazine, Gabapentin, Ondansetron hcl, Other, Prednisone, Prochlorperazine edisylate, Sulfa antibiotics, and Sulfonamide derivatives   Review of Systems Review of Systems  Skin:  Insect bite of hand     Physical Exam Triage Vital Signs ED Triage Vitals  Enc Vitals Group     BP 07/26/22 1021 131/69     Pulse Rate 07/26/22 1021 69     Resp 07/26/22 1021 16     Temp 07/26/22 1021 (!) 97.4 F (36.3 C)     Temp Source 07/26/22 1021 Oral     SpO2 07/26/22 1021 98 %     Weight --      Height --      Head Circumference --      Peak Flow --      Pain Score 07/26/22 1024 0     Pain Loc --      Pain Edu? --      Excl. in GC? --    No data found.  Updated Vital Signs BP 131/69 (BP Location: Right Arm)   Pulse 69   Temp (!) 97.4 F (36.3 C) (Oral)   Resp 16   SpO2 98%   Visual Acuity Right Eye Distance:   Left Eye Distance:   Bilateral Distance:    Right Eye Near:   Left Eye Near:    Bilateral Near:     Physical Exam Vitals and nursing note reviewed.  Constitutional:      General: She is not in acute distress.    Appearance: Normal appearance. She is not ill-appearing.  HENT:     Head: Normocephalic and atraumatic.  Eyes:     Pupils: Pupils are equal, round, and reactive to light.  Cardiovascular:     Rate and Rhythm: Normal rate.  Pulmonary:     Effort: Pulmonary effort is normal.  Musculoskeletal:       Hands:     Comments: Very mild swelling with mild erythema and warmth to the proximal first and second fingers of the right hand that extends slightly into the dorsum of the hand.  Scattered insect bites  noted on hand as well.  No drainage, fluctuance, induration.  Skin:    General: Skin is warm and dry.  Neurological:     General: No focal deficit present.     Mental Status: She is alert and oriented to person, place, and time.  Psychiatric:        Mood and Affect: Mood normal.        Behavior: Behavior normal.      UC Treatments / Results  Labs (all labs ordered are listed, but only abnormal results are displayed) Labs Reviewed - No data to display  Comprehensive metabolic panel Order: [id number] Status: Final result     Visible to patient: Yes (seen)     Next appt: 08/04/2022 at 11:00 AM in Rehabilitation (RINE,KATHRYN, OT)     Dx: Prediabetes   0 Result Notes     1 Patient Communication          Component Ref Range & Units 2 mo ago (05/20/22) 1 yr ago (05/06/21) 2 yr ago (04/15/20) 2 yr ago (12/28/19) 3 yr ago (07/20/19) 3 yr ago (03/09/19) 4 yr ago (03/07/18)  Sodium 135 - 145 mEq/L 141 137 140  138 138 139  Potassium 3.5 - 5.1 mEq/L 3.9 3.8 4.5  4.2 4.8 3.6  Chloride 96 - 112 mEq/L 103 101 102  101 103 100  CO2 19 - 32 mEq/L 28 29 31   34 High  31 31  Glucose, Bld 70 - 99 mg/dL 93 409 High  811 High  103 R 104  High  101 High  105 High   BUN 6 - 23 mg/dL 16 16 15  16 17 15   Creatinine, Ser 0.40 - 1.20 mg/dL 1.61 0.96 0.45 0.7 R 4.09 0.70 0.72  Total Bilirubin 0.2 - 1.2 mg/dL 0.5 0.5 0.5  0.4 0.5 0.6  Alkaline Phosphatase 39 - 117 U/L 61 65 58  65 61 59  AST 0 - 37 U/L 17 23 17  19 22 15   ALT 0 - 35 U/L 13 16 13  16 17 11   Total Protein 6.0 - 8.3 g/dL 7.1 7.6 7.1  7.9 7.2 7.1  Albumin 3.5 - 5.2 g/dL 4.4 4.5 4.3  4.7 4.2 4.3  GFR >60.00 mL/min 85.65 83.40 CM 87.22 CM  85.33 82.61 80.20  Comment: Calculated using the CKD-EPI Creatinine Equation (2021)  Calcium 8.4 - 10.5 mg/dL 81.1 91.4 High  78.2  95.6 High  9.9 10.0  Resulting Agency [place] [facility] [place] [facility] [place] [facility] [facility] [place] [facility] [place] [facility] [place] [facility]       EKG   Radiology No results found.  Procedures Procedures (including critical care time)  Medications Ordered in UC Medications - No data to display  Initial Impression / Assessment and Plan / UC Course  I have reviewed the triage vital signs and the nursing notes.  Pertinent labs & imaging results that were available during my care of the patient were reviewed by me and considered in my medical decision making (see chart for details).     Reviewed exam and symptoms with patient.  Discussed likely local reaction to ant bite.  She should continue her Epsom salt soaks with topical Benadryl cream as needed.  A provisional prescription for Keflex has been provided and she is was instructed only to take if infection symptoms occur, red flags reviewed in length and she verbalized understanding. Follow-up with PCP as needed Final Clinical Impressions(s) / UC Diagnoses   Final diagnoses:  Insect bite of right hand, initial encounter  Local reaction to insect sting, accidental or unintentional, initial encounter     Discharge Instructions      Continue doing your topical Benadryl and Epsom salt soaks A provisional prescription for cephalexin has been sent to pharmacy.  Take only if your symptoms begin to worsen, the redness/swelling/warmth spread, or you develop any fevers or chills Follow-up with your PCP as needed    ED Prescriptions     Medication Sig Dispense Auth. Provider   cephALEXin (KEFLEX) 500 MG capsule Take 1 capsule (500 mg total) by mouth 3 (three) times daily for 7 days. 20 capsule Radford Pax, NP      PDMP not reviewed this encounter.   Radford Pax, NP 07/26/22 1041    Radford Pax, NP 07/26/22 [phone]

## 2022-07-26 NOTE — Discharge Instructions (Signed)
Continue doing your topical Benadryl and Epsom salt soaks A provisional prescription for cephalexin has been sent to pharmacy.  Take only if your symptoms begin to worsen, the redness/swelling/warmth spread, or you develop any fevers or chills Follow-up with your PCP as needed

## 2022-07-26 NOTE — ED Triage Notes (Signed)
Pt presents to UC w/ c/o right hand swelling x3 days ago. Believes she was bit by an ant.   Pt on doxycicline for rosacea Pt has tried the following home remedies: 1/2 benadryl pill, benadryl cream, ice, baking soda, epsom salt, have helped bring down swelling.

## 2022-07-27 ENCOUNTER — Other Ambulatory Visit: Payer: Self-pay

## 2022-07-27 ENCOUNTER — Telehealth: Payer: Self-pay | Admitting: Cardiovascular Disease

## 2022-07-27 DIAGNOSIS — R931 Abnormal findings on diagnostic imaging of heart and coronary circulation: Secondary | ICD-10-CM

## 2022-07-27 MED ORDER — ROSUVASTATIN CALCIUM 10 MG PO TABS: 10.0000 mg | ORAL_TABLET | Freq: Every day | ORAL | 3 refills | Status: DC

## 2022-07-27 NOTE — Telephone Encounter (Signed)
Spoke to patient coronary calcium score results given.Advised to start Rosuvastatin 10 mg daily.Lipid panel in 3 months.Lab order mailed.

## 2022-07-27 NOTE — Telephone Encounter (Signed)
Patient wants a call back to discuss recent test results and next steps.

## 2022-07-28 ENCOUNTER — Telehealth: Payer: Self-pay | Admitting: Family Medicine

## 2022-07-28 NOTE — Telephone Encounter (Signed)
Patient calling to see if Dr. Doreene Burke would discuss CT cardiac scoring if available to speak with patient she would like your opinion. Please advise

## 2022-07-28 NOTE — Telephone Encounter (Signed)
[name] [phone]  Pt had a heart scan and would like for Dr Doreene Burke to look it over and give her a call with the results la mens terms.

## 2022-07-29 NOTE — Telephone Encounter (Signed)
Pt is wanting a cb concerning this issue.  

## 2022-07-29 NOTE — Telephone Encounter (Signed)
Called patient informed that Dr. Evangeline Gula recommendation/response was in agreement with Cardiology and for patient to start on Rosuvastatin. Per patient she would like for someone to explain the need for medication due to labs in normal range. Patient not sure about starting on meds at this time requesting if Dr. Doreene Burke is booked if patient could discuss with Salvatore Decent.

## 2022-07-29 NOTE — Telephone Encounter (Signed)
Returned patients call no answer LMTCB 

## 2022-07-29 NOTE — Telephone Encounter (Signed)
Duplicate message. 

## 2022-07-29 NOTE — Telephone Encounter (Signed)
Pt returned call. She will call back again later this afternoon.

## 2022-08-03 ENCOUNTER — Encounter: Payer: Self-pay | Admitting: Internal Medicine

## 2022-08-03 ENCOUNTER — Other Ambulatory Visit: Payer: Self-pay | Admitting: Cardiovascular Disease

## 2022-08-03 ENCOUNTER — Ambulatory Visit: Payer: Medicare PPO | Admitting: Internal Medicine

## 2022-08-03 VITALS — BP 120/70 | HR 79 | Temp 98.3°F | Wt 114.4 lb

## 2022-08-03 DIAGNOSIS — R002 Palpitations: Secondary | ICD-10-CM

## 2022-08-03 DIAGNOSIS — R931 Abnormal findings on diagnostic imaging of heart and coronary circulation: Secondary | ICD-10-CM

## 2022-08-03 DIAGNOSIS — R7303 Prediabetes: Secondary | ICD-10-CM

## 2022-08-03 DIAGNOSIS — I491 Atrial premature depolarization: Secondary | ICD-10-CM

## 2022-08-03 NOTE — Progress Notes (Signed)
[facility] [address] [place] [address] Dept: [phone] Dept Fax: [phone]  Acute Care Office Visit  Subjective:   [name] 05-09-1950 08/03/2022  Chief Complaint  Patient presents with   Discuss CT cardiology scoring   Medication Consultation    HPI: Patient presents to discuss her recent coronary artery calcium scores.  She recently had study done which showed a score of 251, 79th percentile based upon her age race and sex.  She was recommended to start on rosuvastatin 10mg , and have a repeat lipid panel done in 3 months.  She is concerned about starting medication and the results of her CT scan.   The 10-year ASCVD risk score (Arnett DK, et al., 2019) is: 11.2%   Values used to calculate the score:     Age: 72 years     Sex: Female     Is Non-Hispanic African American: No     Diabetic: No     Tobacco smoker: No     Systolic Blood Pressure: 120 mmHg     Is BP treated: No     HDL Cholesterol: 68.3 mg/dL     Total Cholesterol: 175 mg/dL   [name] presents for pre-diabetes medication concerns.  Current medication: Metformin Xr 500mg  , she has not started medication yet because she wanted to discuss A1C results and medication with provider prior to starting.  Adhering to healthy low carb diet: yes Regular exercise:yes Denies polydipsia, polyphagia, polyuria.  Lab Results  Component Value Date   HGBA1C 5.9 05/20/2022    The following portions of the patient's history were reviewed and updated as appropriate: past medical history, past surgical history, family history, social history, allergies, medications, and problem list.   Patient Active Problem List   Diagnosis Date Noted   Agatston coronary artery calcium score between 200 and 399 08/03/2022   Osteopenia of necks of both femurs 06/23/2022   Dupuytren's contracture of both hands 06/23/2022   Low TSH level 05/20/2022   Prediabetes  05/20/2022   Cellulitis of finger of right hand 09/08/2021   Infection of skin 05/13/2021   Cervicalgia 05/06/2021   Grieving 05/06/2021   Conductive hearing loss of right ear 03/04/2021   Impacted cerumen of right ear 03/04/2021   Medication side effect 03/03/2021   Excessive cerumen in right ear canal 03/03/2021   Labyrinthitis 07/30/2020   Palpitations 04/15/2020   Plantar fasciitis 04/15/2020   Dysthymia 04/15/2020   Vitamin D deficiency 04/15/2020   Gastroesophageal reflux disease 04/15/2020   Insect bite of left foot 11/24/2019   Pustule 11/24/2019   Pain of right thumb 03/09/2019   External hemorrhoids 03/03/2018   Other chest pain 02/23/2018   Insomnia 02/24/2017   Healthcare maintenance 02/24/2017   Conjunctivitis 03/01/2014   Seborrheic keratosis, inflamed 01/05/2012   Menopausal syndrome 12/22/2011   Encounter for counseling 11/11/2011   Skin cancer 07/14/2011   Basal cell carcinoma of face 03/10/2011   Migraine 05/14/2007   IRRITABLE BOWEL SYNDROME 05/14/2007   History of mitral valve prolapse 05/14/2007   Allergic rhinitis 04/07/2007   DERMATITIS DUE TO SOLAR RADIATION 04/07/2007   Premature beat 09/20/2006   DIVERTICULOSIS, COLON 04/13/2002   Past Medical History:  Diagnosis Date   Anal fissure    Anemia    during pregnancy   Anxiety    Basal cell carcinoma    DDD (degenerative disc disease)    Diverticulosis    Hypokalemia    LBP (  low back pain)    Melanoma (HCC)    Migraine    MVP (mitral valve prolapse) 1990   psa   Rosacea    Ulcer    as a child   Past Surgical History:  Procedure Laterality Date   ABDOMINAL HYSTERECTOMY  1985   BREAST BIOPSY Left    secondary to infection   I & D EXTREMITY Right 09/09/2012   Procedure: Minor IRRIGATION AND DEBRIDEMENT EXTREMITY paronychia of right thumb;  Surgeon: Nicki Reaper, MD;  Location: [facility];  Service: Orthopedics;  Laterality: Right;   I & D EXTREMITY Left 01/26/2014    Procedure: MINOR IINCISION AND DRAINAGE paronychia left index finger;  Surgeon: Cindee Salt, MD;  Location: [facility];  Service: Orthopedics;  Laterality: Left;  left index   OOPHORECTOMY Left 1994   REPAIR KNEE LIGAMENT Left 1989   TONSILLECTOMY  1955   TUBAL LIGATION     VEIN SURGERY     Family History  Problem Relation Age of Onset   Heart disease Mother    Colon polyps Mother    Heart disease Father    Colitis Father    Colon polyps Father    Esophageal cancer Maternal Aunt    Colon cancer Neg Hx    Outpatient Medications Prior to Visit  Medication Sig Dispense Refill   Ascorbic Acid (VITAMIN C) 1000 MG tablet SMARTSIG:1 By Mouth     atenolol (TENORMIN) 25 MG tablet Take 0.5 tablets (12.5 mg total) by mouth daily as needed (for palpitations). 30 tablet 0   calcium-vitamin D 250-100 MG-UNIT per tablet Take 1 tablet by mouth daily.     clobetasol ointment (TEMOVATE) 0.05 % clobetasol 0.05 % topical ointment  APPLY TO VULVA TWICE WEEKLY     Doxylamine Succinate, Sleep, (SLEEP-AID PO) Take by mouth. [facility] sleep aid     erythromycin ophthalmic ointment Place 1 Application into the left eye at bedtime.     estradiol (ESTRACE) 0.5 MG tablet Take 0.5 mg by mouth daily.     hydrocortisone (ANUSOL-HC) 2.5 % rectal cream Place 1 Application rectally 2 (two) times daily. 30 g 0   L-LYSINE PO Take 1 tablet by mouth 2 (two) times a week.     meclizine (ANTIVERT) 12.5 MG tablet meclizine 12.5 mg tablet  one tablet every 6-12 hours as needed for dizziness 30 tablet 1   meloxicam (MOBIC) 15 MG tablet TAKE 1 TABLET BY MOUTH AS NEEDED 30 tablet 1   metFORMIN (GLUCOPHAGE-XR) 500 MG 24 hr tablet Take 1 tablet (500 mg total) by mouth at bedtime. 90 tablet 1   Multiple Vitamin (MULTIVITAMIN) tablet Take 1 tablet by mouth daily.     Phenyleph-Diphenhyd-Hydrocod (HYDRO-DP PO) Take by mouth. Hydroeye     RESTASIS 0.05 % ophthalmic emulsion      rosuvastatin (CRESTOR) 10 MG tablet Take 1  tablet (10 mg total) by mouth daily. 90 tablet 3   SAMBUCOL BLACK ELDERBERRY PO Take 1 capsule by mouth 2 (two) times a week.     zolmitriptan (ZOMIG-ZMT) 2.5 MG disintegrating tablet TAKE 1 TABLET BY MOUTH AS NEEDED FOR MIGRAINE 10 tablet 4   No facility-administered medications prior to visit.   Allergies  Allergen Reactions   Codeine    Compazine    Gabapentin    Ondansetron Hcl Other (See Comments)   Other Other (See Comments)   Prednisone     REACTION: makes heart beat hard \\T \ fast   Prochlorperazine  Edisylate    Sulfa Antibiotics Other (See Comments)   Sulfonamide Derivatives      ROS: A complete ROS was performed with pertinent positives/negatives noted in the HPI. The remainder of the ROS are negative.    Objective:   Today's Vitals   08/03/22 1337  BP: 120/70  Pulse: 79  Temp: 98.3 F (36.8 C)  TempSrc: Temporal  SpO2: 98%  Weight: 114 lb 6.4 oz (51.9 kg)    GENERAL: Well-appearing, in NAD. Well nourished.  SKIN: Pink, warm and dry.  RESPIRATORY: Chest wall symmetrical. Respirations even and non-labored. MSK: Muscle tone and strength appropriate for age.  NEUROLOGIC: Steady, even gait.  PSYCH/MENTAL STATUS: Alert, oriented x 3. Cooperative, appropriate mood and affect.    No results found for any visits on 08/03/22.    Assessment & Plan:  1. Prediabetes -Discussed pathophysiology of prediabetes and risk of heart disease related with disease process.  Discussed metformin mechanism of action, side effects, and reduction of A1c.  Advised patient to start metformin as directed, she would like to try half tablet once daily as she is sensitive to medications, then increase to 1 full tablet as tolerated.  Patient is in agreement with starting medication and verbalizes understanding of prediabetes and the risk that it plays in heart disease.  Educated patient to continue a low carbohydrate/low sugar diet, and continue routine exercise regimen.   2. Agatston  coronary artery calcium score between 200 and 399 Discussed CT calcium score results with patient.  Discussed the correlation of the results versus her lipid panel results.  Educated patient on statin therapy and how it reduces the risk of cardiovascular disease.  Discussed benefits, risks, and side effects of statin therapy.  Patient would like to start rosuvastatin 5 mg once daily, and then increase to 10 mg once daily if tolerated due to concerns for myalgias.  Patient is aware to return for fasting blood work in 3 months as scheduled.   No orders of the defined types were placed in this encounter.  Lab Orders  No laboratory test(s) ordered today   No images are attached to the encounter or orders placed in the encounter.  Return for scheduled routine office visits and as needed.   Salvatore Decent, FNP

## 2022-08-04 ENCOUNTER — Telehealth: Payer: Self-pay | Admitting: Family Medicine

## 2022-08-04 ENCOUNTER — Encounter: Payer: Self-pay | Admitting: Occupational Therapy

## 2022-08-04 ENCOUNTER — Other Ambulatory Visit: Payer: Self-pay

## 2022-08-04 ENCOUNTER — Ambulatory Visit: Payer: Medicare PPO | Admitting: Occupational Therapy

## 2022-08-04 DIAGNOSIS — M25642 Stiffness of left hand, not elsewhere classified: Secondary | ICD-10-CM | POA: Diagnosis not present

## 2022-08-04 DIAGNOSIS — M6281 Muscle weakness (generalized): Secondary | ICD-10-CM | POA: Diagnosis not present

## 2022-08-04 DIAGNOSIS — M25541 Pain in joints of right hand: Secondary | ICD-10-CM | POA: Diagnosis not present

## 2022-08-04 DIAGNOSIS — M25641 Stiffness of right hand, not elsewhere classified: Secondary | ICD-10-CM | POA: Diagnosis not present

## 2022-08-04 DIAGNOSIS — M720 Palmar fascial fibromatosis [Dupuytren]: Secondary | ICD-10-CM | POA: Diagnosis not present

## 2022-08-04 DIAGNOSIS — M25542 Pain in joints of left hand: Secondary | ICD-10-CM

## 2022-08-04 MED ORDER — METFORMIN HCL 500 MG PO TABS: 250.0000 mg | ORAL_TABLET | Freq: Every day | ORAL | 3 refills | Status: DC

## 2022-08-04 NOTE — Telephone Encounter (Signed)
Pt called again to say the Metformin has given her diarrhea.

## 2022-08-04 NOTE — Therapy (Signed)
OUTPATIENT OCCUPATIONAL THERAPY ORTHO Treatment   Patient Name: [name] MRN: [id number] DOB:07-Jun-1950, 72 y.o., female Today's Date: 08/04/2022  PCP: Dr. Doreene Burke REFERRING PROVIDER: Dr. Doreene Burke  END OF SESSION:  OT End of Session - 08/04/22 1244     Visit Number 2    Number of Visits 12    Date for OT Re-Evaluation 10/05/22    Authorization Type [employer]    Authorization Time Period 90 days    Authorization - Number of Visits 10    Progress Note Due on Visit 10    OT Start Time 1102    OT Stop Time 1145    OT Time Calculation (min) 43 min    Activity Tolerance Patient tolerated treatment well    Behavior During Therapy WFL for tasks assessed/performed              Past Medical History:  Diagnosis Date   Anal fissure    Anemia    during pregnancy   Anxiety    Basal cell carcinoma    DDD (degenerative disc disease)    Diverticulosis    Hypokalemia    LBP (low back pain)    Melanoma (HCC)    Migraine    MVP (mitral valve prolapse) 1990   psa   Rosacea    Ulcer    as a child   Past Surgical History:  Procedure Laterality Date   ABDOMINAL HYSTERECTOMY  1985   BREAST BIOPSY Left    secondary to infection   I & D EXTREMITY Right 09/09/2012   Procedure: Minor IRRIGATION AND DEBRIDEMENT EXTREMITY paronychia of right thumb;  Surgeon: Nicki Reaper, MD;  Location: [facility];  Service: Orthopedics;  Laterality: Right;   I & D EXTREMITY Left 01/26/2014   Procedure: MINOR IINCISION AND DRAINAGE paronychia left index finger;  Surgeon: Cindee Salt, MD;  Location: [facility];  Service: Orthopedics;  Laterality: Left;  left index   OOPHORECTOMY Left 1994   REPAIR KNEE LIGAMENT Left 1989   TONSILLECTOMY  1955   TUBAL LIGATION     VEIN SURGERY     Patient Active Problem List   Diagnosis Date Noted   Agatston coronary artery calcium score between 200 and 399 08/03/2022   Osteopenia of necks of both femurs 06/23/2022    Dupuytren's contracture of both hands 06/23/2022   Low TSH level 05/20/2022   Prediabetes 05/20/2022   Cellulitis of finger of right hand 09/08/2021   Infection of skin 05/13/2021   Cervicalgia 05/06/2021   Grieving 05/06/2021   Conductive hearing loss of right ear 03/04/2021   Impacted cerumen of right ear 03/04/2021   Medication side effect 03/03/2021   Excessive cerumen in right ear canal 03/03/2021   Labyrinthitis 07/30/2020   Palpitations 04/15/2020   Plantar fasciitis 04/15/2020   Dysthymia 04/15/2020   Vitamin D deficiency 04/15/2020   Gastroesophageal reflux disease 04/15/2020   Insect bite of left foot 11/24/2019   Pustule 11/24/2019   Pain of right thumb 03/09/2019   External hemorrhoids 03/03/2018   Other chest pain 02/23/2018   Insomnia 02/24/2017   Healthcare maintenance 02/24/2017   Conjunctivitis 03/01/2014   Seborrheic keratosis, inflamed 01/05/2012   Menopausal syndrome 12/22/2011   Encounter for counseling 11/11/2011   Skin cancer 07/14/2011   Basal cell carcinoma of face 03/10/2011   Migraine 05/14/2007   IRRITABLE BOWEL SYNDROME 05/14/2007   History of mitral valve prolapse 05/14/2007   Allergic rhinitis 04/07/2007   DERMATITIS  DUE TO SOLAR RADIATION 04/07/2007   Premature beat 09/20/2006   DIVERTICULOSIS, COLON 04/13/2002    ONSET DATE: 06/25/22  REFERRING DIAG: M72.0 (ICD-10-CM) - Dupuytren's contracture of both hands   THERAPY DIAG:  Stiffness of left hand, not elsewhere classified  Stiffness of right hand, not elsewhere classified  Muscle weakness (generalized)  Pain in joint of left hand  Pain in joint of right hand  Rationale for Evaluation and Treatment: Rehabilitation  SUBJECTIVE:   SUBJECTIVE STATEMENT: Pt reports she has been doing her exercises Pt accompanied by: self  PERTINENT HISTORY: Dupuytren's contracture of both hands, osteopenia recently diagnosed, see active problem list above  PRECAUTIONS: None  WEIGHT BEARING  RESTRICTIONS: No  PAIN:  Are you having pain? Yes: NPRS scale: 3/10 at [facility] 8/10 with use Pain location: bilateral thumb CMC joint Pain description: aching  Aggravating factors: overuse Relieving factors: heat, paraffin, rest  FALLS: Has patient fallen in last 6 months? No  LIVING ENVIRONMENT: Lives with: lives with their spouse   PLOF: Independent  PATIENT GOALS: prevent further tightness in hands    OBJECTIVE:   HAND DOMINANCE: Right  ADLs: Overall ADLs:  independent with all basic ADLs Transfers/ambulation related to ADLs: independent   FUNCTIONAL OUTCOME MEASURES: Upper Extremity Functional Scale (UEFS): 56/80=70%, with 30% limitation  UPPER EXTREMITY ROM:   Pt demonstrates full composite finger flexion and extension bilaterally     Active ROM Right eval Left eval  Thumb MCP (0-60) 40 50  Thumb IP (0-80) 75 80  Thumb Radial abd/add (0-55) 65  75  Thumb Palmar abd/add (0-45)     Thumb Opposition to Small Finger WFL WFL  Index MCP (0-90)     Index PIP (0-100)     Index DIP (0-70)      Long MCP (0-90)      Long PIP (0-100)      Long DIP (0-70)      Ring MCP (0-90)      Ring PIP (0-100)      Ring DIP (0-70)      Little MCP (0-90)      Little PIP (0-100)      Little DIP (0-70)      (Blank rows = not tested)    HAND FUNCTION: Grip strength: Right: 33 lbs; Left: 35 lbs    SENSATION: WFL  Observations: cording noted in R palm > than left particularly at middle and ring fingers right hand     TODAY'S TREATMENT:                                                                                                                              DATE: 08/04/22- Paraffin to RUE  x 10 mins and hotpack applied to left UE( due to mosquito bite with open place.) no adverse reactions. Reviewed previously issued exercises and added exercises 1, 2, 3, 4 from [place] protocol- page 25) 5-10 reps each Massage to right palm followed  by [place] , 0.8 w/cm 2, 20% x 8 mins   to right palm and thenar eminence for pain and tightness. No adverse reactions. Pt was encouraged to avoid tight gripping and to wrap golf clubs with overgrip. Pt was encouraged to wear her thumb braces when she is performing repetitive activities   07/13/22 Paraffin to bilateral UE's x 8 mins for stiffness and pain, no adverse reactions. Pt was instructed in A/ROM exercises from [place] protocol 6, 7, 8, 9, 10  for bilateral UE's min-mod v.c and demonstration   PATIENT EDUCATION: Education details:reviewed previous exercises, Added exercises 1-4 page 25,  5-10 reps each, min v.c Person educated: Patient Education method: Explanation, Demonstration, Tactile cues, Verbal cues, and Handouts Education comprehension: verbalized understanding, returned demonstration, and verbal cues required  HOME EXERCISE PROGRAM: [place] protocol 6, 7, 8, 9, 10 for dupetryn's 08/04/22-Added exercises 1-4 page 25,   GOALS: Goals reviewed with patient? Yes  SHORT TERM GOALS: Target date: 08/11/22  I with initial HEP Baseline: Goal status: met 08/04/22  2.  I with positioning, including splint wear to minimize pain and risk for contractures Baseline:  Goal status: ongoing 08/04/22    LONG TERM GOALS: Target date: 10/05/22  I with updated HEP Baseline:  Goal status: INITIAL  2.  I with AE and activity modification to minimize pain and  increase safety and independence with ADLs/IADLS. Baseline:  Goal status: INITIAL  3.  Pt will demonstrate improved UE function as evidenced by improving her UEFS score by 4 points. Baseline:  Goal status: INITIAL   CLINICAL IMPRESSION: Patient is progressing towards goals. She demonstrates understanding of HEP. She will be out of town for the next 2 weeks.  PERFORMANCE DEFICITS: in functional skills including ADLs, IADLs, coordination, dexterity, ROM, strength, pain, fascial restrictions, flexibility, decreased knowledge of precautions, decreased knowledge of  use of DME, and UE functional use, cognitive skills including , and psychosocial skills including coping strategies, environmental adaptation, habits, interpersonal interactions, and routines and behaviors.   IMPAIRMENTS: are limiting patient from ADLs, IADLs, play, leisure, and social participation.   COMORBIDITIES: may have co-morbidities  that affects occupational performance. Patient will benefit from skilled OT to address above impairments and improve overall function.  MODIFICATION OR ASSISTANCE TO COMPLETE EVALUATION: No modification of tasks or assist necessary to complete an evaluation.  OT OCCUPATIONAL PROFILE AND HISTORY: Detailed assessment: Review of records and additional review of physical, cognitive, psychosocial history related to current functional performance.  CLINICAL DECISION MAKING: LOW - limited treatment options, no task modification necessary  REHAB POTENTIAL: Good  EVALUATION COMPLEXITY: Low      PLAN:  OT FREQUENCY: 1x/week  OT DURATION: 12 weeks  PLANNED INTERVENTIONS: self care/ADL training, therapeutic exercise, therapeutic activity, neuromuscular re-education, manual therapy, scar mobilization, passive range of motion, splinting, electrical stimulation, ultrasound, iontophoresis, paraffin, fluidotherapy, moist heat, cryotherapy, contrast bath, patient/family education, energy conservation, coping strategies training, DME and/or AE instructions, and Re-evaluation  RECOMMENDED OTHER SERVICES: none  CONSULTED AND AGREED WITH PLAN OF CARE: Patient  PLAN FOR NEXT SESSION:  check on HEP,  [place]   Amelya Mabry, OT 08/04/2022, 12:45 PM

## 2022-08-04 NOTE — Telephone Encounter (Signed)
A new Rx for Metformin has been sent to the pharmacy

## 2022-08-04 NOTE — Telephone Encounter (Signed)
Pt has stated that her blood sugar is too low. She thinks it is due to the 500mg  Metformin is too high. She is requesting 250mg  Please call

## 2022-08-04 NOTE — Telephone Encounter (Signed)
error 

## 2022-08-04 NOTE — Telephone Encounter (Signed)
Pt spoke to pharmacy and they can change her pill to one she can cut in half. She said if Morrie Sheldon is ok with that she will go pick it up or if shwe wants to prescribe something else ok too.

## 2022-08-05 ENCOUNTER — Telehealth: Payer: Self-pay | Admitting: Cardiovascular Disease

## 2022-08-05 NOTE — Telephone Encounter (Signed)
She stated she will continue to take crestor. She would like to know if you think she needs to make life style changes to help. I did advise she can reduce saturated fats to help keep cholesterol down and taking her medication as prescribed being that she only took half of the 10 mg that was prescribed. She also wanted to know if the artery that has the calcium build up is what they call the widow maker.

## 2022-08-05 NOTE — Telephone Encounter (Signed)
Please tell her that I have prescribed it to many patients.  At most 10% of my patients have not tolerated this medication. Let her know that I take it also.

## 2022-08-05 NOTE — Telephone Encounter (Signed)
Patient called stating pharmacy informed her that crestor may not be the right medication for her and that most people can not tolerate it. We discuss side effects and advised to give [place] a call if she have a reaction.

## 2022-08-05 NOTE — Telephone Encounter (Signed)
Patient is requesting to speak with Dr. Erin Hearing nurse. She declined discussing with me and states that she would prefer to speak directly with the nurse.

## 2022-08-06 NOTE — Telephone Encounter (Signed)
Patient aware of provider recommendations. She verbalized understanding. Also  sent her the information in mychart per patient request.

## 2022-08-06 NOTE — Telephone Encounter (Signed)
Most of the calcium is indeed in the LAD artery, but there are 2 important points: - the calcium score is NOT a diagnostic test, but a prognostic one. Just because we see calcified plaque in the LAD artery does not mean that there is a blockage in it. - we call it a "widow-maker" when the blockage is at the very origin of the artery, so that there is a large mass of muscle in jeopardy downstream. The calcium build up was more downstream, in the mid vessel  More importantly: yes, changes in diet/exercise are a critical part of preventing further plaque accumulation. Medication alone cannot make up for a bad diet or lack of physical activity. Having said that, when we reviewed her diet in the office, she was doing pretty well. Avoid saturated fat (beef, pork, whole fat dairy), prefer unsaturated fat (ocean fish such as tuna and salmon, nuts, seeds, olive and canola oil, avocados).

## 2022-08-06 NOTE — Telephone Encounter (Signed)
Left voicemail to return call to office.

## 2022-08-24 ENCOUNTER — Ambulatory Visit: Payer: Medicare PPO | Admitting: Occupational Therapy

## 2022-08-27 ENCOUNTER — Ambulatory Visit: Payer: Medicare PPO | Admitting: Internal Medicine

## 2022-08-27 ENCOUNTER — Encounter: Payer: Self-pay | Admitting: Internal Medicine

## 2022-08-27 VITALS — BP 112/70 | HR 65 | Temp 97.7°F | Ht 61.0 in | Wt 113.0 lb

## 2022-08-27 DIAGNOSIS — R1030 Lower abdominal pain, unspecified: Secondary | ICD-10-CM | POA: Diagnosis not present

## 2022-08-27 LAB — POC URINALSYSI DIPSTICK (AUTOMATED)
Bilirubin, UA: NEGATIVE
Blood, UA: NEGATIVE
Glucose, UA: NEGATIVE
Ketones, UA: NEGATIVE
Leukocytes, UA: NEGATIVE
Nitrite, UA: NEGATIVE
Protein, UA: NEGATIVE
Spec Grav, UA: 1.025 (ref 1.010–1.025)
Urobilinogen, UA: 0.2 E.U./dL
pH, UA: 6 (ref 5.0–8.0)

## 2022-08-27 MED ORDER — AMOXICILLIN-POT CLAVULANATE 875-125 MG PO TABS
1.0000 | ORAL_TABLET | Freq: Two times a day (BID) | ORAL | 0 refills | Status: AC
Start: 2022-08-27 — End: 2022-09-03

## 2022-08-27 NOTE — Progress Notes (Signed)
[facility] [address] [place] [address] Dept: [phone] Dept Fax: [phone]  Acute Care Office Visit  Subjective:   [name] Sep 14, 1950 08/27/2022  Chief Complaint  Patient presents with   Abdominal Pain    Below belly button     HPI: Discussed the use of AI scribe software for clinical note transcription with the patient, who gave verbal consent to proceed.  History of Present Illness   The patient, with hx of diverticulitis, GERD, and IBS presents with mid lower abdominal pain onset 1 week ago. She states she had ate some sugar coated almonds the day before the pain onset. She also admits to eating seeded fruits lately.  The patient has also been taking new medications of Rosuvastatin and Metformin for the past 4 weeks, and was concerned if it could be causing the pain although she has not had pain until last week. Pain described as aching, sometimes radiates up to epigastric region. The pain is sometimes accompanied by a little bit of abdominal bloating. The patient denies any triggers or relievers of the pain. They deny fever, nausea, vomiting, and significant changes in bowel habits. They have a history of hemorrhoids and occasionally notice fresh red blood in the stool.  She has tried taking her omeprazole with no relief of pain. She states the pain has improved since initial onset, but is still present.   The following portions of the patient's history were reviewed and updated as appropriate: past medical history, past surgical history, family history, social history, allergies, medications, and problem list.   Patient Active Problem List   Diagnosis Date Noted   Agatston coronary artery calcium score between 200 and 399 08/03/2022   Osteopenia of necks of both femurs 06/23/2022   Dupuytren's contracture of both hands 06/23/2022   Low TSH level 05/20/2022   Prediabetes 05/20/2022   Cellulitis of  finger of right hand 09/08/2021   Infection of skin 05/13/2021   Cervicalgia 05/06/2021   Grieving 05/06/2021   Conductive hearing loss of right ear 03/04/2021   Impacted cerumen of right ear 03/04/2021   Medication side effect 03/03/2021   Excessive cerumen in right ear canal 03/03/2021   Labyrinthitis 07/30/2020   Palpitations 04/15/2020   Plantar fasciitis 04/15/2020   Dysthymia 04/15/2020   Vitamin D deficiency 04/15/2020   Gastroesophageal reflux disease 04/15/2020   Insect bite of left foot 11/24/2019   Pustule 11/24/2019   Pain of right thumb 03/09/2019   External hemorrhoids 03/03/2018   Other chest pain 02/23/2018   Insomnia 02/24/2017   Healthcare maintenance 02/24/2017   Conjunctivitis 03/01/2014   Seborrheic keratosis, inflamed 01/05/2012   Menopausal syndrome 12/22/2011   Encounter for counseling 11/11/2011   Skin cancer 07/14/2011   Basal cell carcinoma of face 03/10/2011   Migraine 05/14/2007   IRRITABLE BOWEL SYNDROME 05/14/2007   History of mitral valve prolapse 05/14/2007   Allergic rhinitis 04/07/2007   DERMATITIS DUE TO SOLAR RADIATION 04/07/2007   Premature beat 09/20/2006   DIVERTICULOSIS, COLON 04/13/2002   Past Medical History:  Diagnosis Date   Anal fissure    Anemia    during pregnancy   Anxiety    Basal cell carcinoma    DDD (degenerative disc disease)    Diverticulosis    Hypokalemia    LBP (low back pain)    Melanoma (HCC)    Migraine    MVP (mitral valve prolapse) 1990   psa   Rosacea  Ulcer    as a child   Past Surgical History:  Procedure Laterality Date   ABDOMINAL HYSTERECTOMY  1985   BREAST BIOPSY Left    secondary to infection   I & D EXTREMITY Right 09/09/2012   Procedure: Minor IRRIGATION AND DEBRIDEMENT EXTREMITY paronychia of right thumb;  Surgeon: Nicki Reaper, MD;  Location: [facility];  Service: Orthopedics;  Laterality: Right;   I & D EXTREMITY Left 01/26/2014   Procedure: MINOR IINCISION AND  DRAINAGE paronychia left index finger;  Surgeon: Cindee Salt, MD;  Location: [facility];  Service: Orthopedics;  Laterality: Left;  left index   OOPHORECTOMY Left 1994   REPAIR KNEE LIGAMENT Left 1989   TONSILLECTOMY  1955   TUBAL LIGATION     VEIN SURGERY     Family History  Problem Relation Age of Onset   Heart disease Mother    Colon polyps Mother    Heart disease Father    Colitis Father    Colon polyps Father    Esophageal cancer Maternal Aunt    Colon cancer Neg Hx    Outpatient Medications Prior to Visit  Medication Sig Dispense Refill   Ascorbic Acid (VITAMIN C) 1000 MG tablet SMARTSIG:1 By Mouth     atenolol (TENORMIN) 25 MG tablet TAKE 1/2 TABLET(12.5 MG) BY MOUTH DAILY AS NEEDED FOR PALPITATIONS 90 tablet 3   calcium-vitamin D 250-100 MG-UNIT per tablet Take 1 tablet by mouth daily.     clobetasol ointment (TEMOVATE) 0.05 % clobetasol 0.05 % topical ointment  APPLY TO VULVA TWICE WEEKLY     Coenzyme Q10 (CO Q10) 100 MG CAPS Take by mouth daily.     Doxylamine Succinate, Sleep, (SLEEP-AID PO) Take by mouth. [facility] sleep aid     erythromycin ophthalmic ointment Place 1 Application into the left eye at bedtime.     estradiol (ESTRACE) 0.5 MG tablet Take 0.5 mg by mouth daily.     hydrocortisone (ANUSOL-HC) 2.5 % rectal cream Place 1 Application rectally 2 (two) times daily. 30 g 0   L-LYSINE PO Take 1 tablet by mouth 2 (two) times a week.     meclizine (ANTIVERT) 12.5 MG tablet meclizine 12.5 mg tablet  one tablet every 6-12 hours as needed for dizziness 30 tablet 1   meloxicam (MOBIC) 15 MG tablet TAKE 1 TABLET BY MOUTH AS NEEDED 30 tablet 1   metFORMIN (GLUCOPHAGE) 500 MG tablet Take 0.5 tablets (250 mg total) by mouth daily. Take half a tablet every morning with a meal. 90 tablet 3   Multiple Vitamin (MULTIVITAMIN) tablet Take 1 tablet by mouth daily.     omeprazole (PRILOSEC) 20 MG capsule Take 20 mg by mouth daily.     Phenyleph-Diphenhyd-Hydrocod (HYDRO-DP  PO) Take by mouth. Hydroeye     RESTASIS 0.05 % ophthalmic emulsion      rosuvastatin (CRESTOR) 10 MG tablet Take 1 tablet (10 mg total) by mouth daily. 90 tablet 3   SAMBUCOL BLACK ELDERBERRY PO Take 1 capsule by mouth 2 (two) times a week.     zolmitriptan (ZOMIG-ZMT) 2.5 MG disintegrating tablet TAKE 1 TABLET BY MOUTH AS NEEDED FOR MIGRAINE 10 tablet 4   No facility-administered medications prior to visit.   Allergies  Allergen Reactions   Codeine    Compazine    Gabapentin    Ondansetron Hcl Other (See Comments)   Other Other (See Comments)   Prednisone     REACTION: makes heart beat hard \  T\ fast   Prochlorperazine Edisylate    Sulfa Antibiotics Other (See Comments)   Sulfonamide Derivatives      ROS: A complete ROS was performed with pertinent positives/negatives noted in the HPI. The remainder of the ROS are negative.    Objective:   Today's Vitals   08/27/22 1442  BP: 112/70  Pulse: 65  Temp: 97.7 F (36.5 C)  TempSrc: Temporal  SpO2: 98%  Weight: 113 lb (51.3 kg)  Height: 5\' 1"  (1.549 m)    GENERAL: Well-appearing, in NAD. Well nourished.  SKIN: Pink, warm and dry. No rash, lesion, ulceration, or ecchymoses.  NECK: Trachea midline. Full ROM w/o pain or tenderness. No lymphadenopathy.  RESPIRATORY: Chest wall symmetrical. Respirations even and non-labored. Breath sounds clear to auscultation bilaterally.  CARDIAC: S1, S2 present, regular rate and rhythm. Peripheral pulses 2+ bilaterally.  GI: Abdomen soft, mild tenderness to mid lower abdomen. Bowel sounds positive, normal pitch and frequency. No hepatosplenomegaly. No rebound or guarding. No CVA tenderness.  EXTREMITIES: Without clubbing, cyanosis, or edema.  NEUROLOGIC: Steady, even gait.  PSYCH/MENTAL STATUS: Alert, oriented x 3. Cooperative, appropriate mood and affect.    Results for orders placed or performed in visit on 08/27/22  POCT Urinalysis Dipstick (Automated)  Result Value Ref Range   Color,  UA yellow    Clarity, UA clear    Glucose, UA Negative Negative   Bilirubin, UA neg    Ketones, UA neg    Spec Grav, UA 1.025 1.010 - 1.025   Blood, UA neg    pH, UA 6.0 5.0 - 8.0   Protein, UA Negative Negative   Urobilinogen, UA 0.2 0.2 or 1.0 E.U./dL   Nitrite, UA neg    Leukocytes, UA Negative Negative      Assessment & Plan:  Assessment and Plan 1. Lower abdominal pain - concern for diverticulitis given patient's hx, abdominal pain, and recent consumption of nuts and seeds that seemed to onset pain. Will treat accordingly  - POCT Urinalysis Dipstick (Automated) - amoxicillin-clavulanate (AUGMENTIN) 875-125 MG tablet; Take 1 tablet by mouth 2 (two) times daily for 7 days.  Dispense: 14 tablet; Refill: 0 - CBC w/Diff - Comp Met (CMET)        Meds ordered this encounter  Medications   amoxicillin-clavulanate (AUGMENTIN) 875-125 MG tablet    Sig: Take 1 tablet by mouth 2 (two) times daily for 7 days.    Dispense:  14 tablet    Refill:  0    Order Specific Question:   Supervising Provider    Answer:   Garnette Gunner [[id number]]   Lab Orders         CBC w/Diff         Comp Met (CMET)         POCT Urinalysis Dipstick (Automated)     No images are attached to the encounter or orders placed in the encounter.  Return if symptoms worsen or fail to improve.   Of note, portions of this note may have been created with voice recognition software Physicist, medical). While this note has been edited for accuracy, occasional wrong-word or 'sound-a-like' substitutions may have occurred due to the inherent limitations of voice recognition software.  Salvatore Decent, FNP

## 2022-08-28 LAB — CBC WITH DIFFERENTIAL/PLATELET
Basophils Absolute: 0.1 10*3/uL (ref 0.0–0.1)
Basophils Relative: 1.1 % (ref 0.0–3.0)
Eosinophils Absolute: 0.1 10*3/uL (ref 0.0–0.7)
Eosinophils Relative: 1.8 % (ref 0.0–5.0)
HCT: 40.4 % (ref 36.0–46.0)
Hemoglobin: 13.1 g/dL (ref 12.0–15.0)
Lymphocytes Relative: 28.5 % (ref 12.0–46.0)
Lymphs Abs: 2.3 10*3/uL (ref 0.7–4.0)
MCHC: 32.4 g/dL (ref 30.0–36.0)
MCV: 96.6 fl (ref 78.0–100.0)
Monocytes Absolute: 0.7 10*3/uL (ref 0.1–1.0)
Monocytes Relative: 8.9 % (ref 3.0–12.0)
Neutro Abs: 4.9 10*3/uL (ref 1.4–7.7)
Neutrophils Relative %: 59.7 % (ref 43.0–77.0)
Platelets: 286 10*3/uL (ref 150.0–400.0)
RBC: 4.18 Mil/uL (ref 3.87–5.11)
RDW: 12.7 % (ref 11.5–15.5)
WBC: 8.2 10*3/uL (ref 4.0–10.5)

## 2022-08-28 LAB — COMPREHENSIVE METABOLIC PANEL
ALT: 16 U/L (ref 0–35)
AST: 17 U/L (ref 0–37)
Albumin: 4.5 g/dL (ref 3.5–5.2)
Alkaline Phosphatase: 66 U/L (ref 39–117)
BUN: 20 mg/dL (ref 6–23)
CO2: 29 mEq/L (ref 19–32)
Calcium: 10.5 mg/dL (ref 8.4–10.5)
Chloride: 101 mEq/L (ref 96–112)
Creatinine, Ser: 0.73 mg/dL (ref 0.40–1.20)
GFR: 81.28 mL/min (ref >60.00)
Glucose, Bld: 99 mg/dL (ref 70–99)
Potassium: 4.1 mEq/L (ref 3.5–5.1)
Sodium: 139 mEq/L (ref 135–145)
Total Bilirubin: 0.4 mg/dL (ref 0.2–1.2)
Total Protein: 7.1 g/dL (ref 6.0–8.3)

## 2022-08-31 ENCOUNTER — Ambulatory Visit: Payer: Medicare PPO | Attending: Family Medicine | Admitting: Occupational Therapy

## 2022-08-31 ENCOUNTER — Telehealth: Payer: Self-pay | Admitting: Family Medicine

## 2022-08-31 DIAGNOSIS — M25642 Stiffness of left hand, not elsewhere classified: Secondary | ICD-10-CM | POA: Diagnosis not present

## 2022-08-31 DIAGNOSIS — M6281 Muscle weakness (generalized): Secondary | ICD-10-CM | POA: Insufficient documentation

## 2022-08-31 DIAGNOSIS — M25541 Pain in joints of right hand: Secondary | ICD-10-CM | POA: Diagnosis not present

## 2022-08-31 DIAGNOSIS — M25641 Stiffness of right hand, not elsewhere classified: Secondary | ICD-10-CM | POA: Insufficient documentation

## 2022-08-31 DIAGNOSIS — M25542 Pain in joints of left hand: Secondary | ICD-10-CM | POA: Insufficient documentation

## 2022-08-31 NOTE — Therapy (Signed)
OUTPATIENT OCCUPATIONAL THERAPY ORTHO Treatment   Patient Name: [name] MRN: [id number] DOB:1951-02-04, 72 y.o., female Today's Date: 08/31/2022  PCP: Dr. Doreene Burke REFERRING PROVIDER: Dr. Doreene Burke  END OF SESSION:  OT End of Session - 08/31/22 1512     Visit Number 3    Number of Visits 12    Date for OT Re-Evaluation 10/05/22    Authorization Type [employer]    Authorization Time Period 90 days    Authorization - Number of Visits 10    Progress Note Due on Visit 10    OT Start Time 1445    OT Stop Time 1530    OT Time Calculation (min) 45 min    Activity Tolerance Patient tolerated treatment well    Behavior During Therapy WFL for tasks assessed/performed               Past Medical History:  Diagnosis Date   Anal fissure    Anemia    during pregnancy   Anxiety    Basal cell carcinoma    DDD (degenerative disc disease)    Diverticulosis    Hypokalemia    LBP (low back pain)    Melanoma (HCC)    Migraine    MVP (mitral valve prolapse) 1990   psa   Rosacea    Ulcer    as a child   Past Surgical History:  Procedure Laterality Date   ABDOMINAL HYSTERECTOMY  1985   BREAST BIOPSY Left    secondary to infection   I & D EXTREMITY Right 09/09/2012   Procedure: Minor IRRIGATION AND DEBRIDEMENT EXTREMITY paronychia of right thumb;  Surgeon: Nicki Reaper, MD;  Location: [facility];  Service: Orthopedics;  Laterality: Right;   I & D EXTREMITY Left 01/26/2014   Procedure: MINOR IINCISION AND DRAINAGE paronychia left index finger;  Surgeon: Cindee Salt, MD;  Location: [facility];  Service: Orthopedics;  Laterality: Left;  left index   OOPHORECTOMY Left 1994   REPAIR KNEE LIGAMENT Left 1989   TONSILLECTOMY  1955   TUBAL LIGATION     VEIN SURGERY     Patient Active Problem List   Diagnosis Date Noted   Agatston coronary artery calcium score between 200 and 399 08/03/2022   Osteopenia of necks of both femurs 06/23/2022    Dupuytren's contracture of both hands 06/23/2022   Low TSH level 05/20/2022   Prediabetes 05/20/2022   Cellulitis of finger of right hand 09/08/2021   Infection of skin 05/13/2021   Cervicalgia 05/06/2021   Grieving 05/06/2021   Conductive hearing loss of right ear 03/04/2021   Impacted cerumen of right ear 03/04/2021   Medication side effect 03/03/2021   Excessive cerumen in right ear canal 03/03/2021   Labyrinthitis 07/30/2020   Palpitations 04/15/2020   Plantar fasciitis 04/15/2020   Dysthymia 04/15/2020   Vitamin D deficiency 04/15/2020   Gastroesophageal reflux disease 04/15/2020   Insect bite of left foot 11/24/2019   Pustule 11/24/2019   Pain of right thumb 03/09/2019   External hemorrhoids 03/03/2018   Other chest pain 02/23/2018   Insomnia 02/24/2017   Healthcare maintenance 02/24/2017   Conjunctivitis 03/01/2014   Seborrheic keratosis, inflamed 01/05/2012   Menopausal syndrome 12/22/2011   Encounter for counseling 11/11/2011   Skin cancer 07/14/2011   Basal cell carcinoma of face 03/10/2011   Migraine 05/14/2007   IRRITABLE BOWEL SYNDROME 05/14/2007   History of mitral valve prolapse 05/14/2007   Allergic rhinitis 04/07/2007  DERMATITIS DUE TO SOLAR RADIATION 04/07/2007   Premature beat 09/20/2006   DIVERTICULOSIS, COLON 04/13/2002    ONSET DATE: 06/25/22  REFERRING DIAG: M72.0 (ICD-10-CM) - Dupuytren's contracture of both hands   THERAPY DIAG:  No diagnosis found.  Rationale for Evaluation and Treatment: Rehabilitation  SUBJECTIVE:   SUBJECTIVE STATEMENT: Pt reports her hands are looser Pt accompanied by: self  PERTINENT HISTORY: Dupuytren's contracture of both hands, osteopenia recently diagnosed, see active problem list above  PRECAUTIONS: None  WEIGHT BEARING RESTRICTIONS: No  PAIN:  Are you having pain? Yes: NPRS scale: 7/10 on arrival, 4/10 at end of session Pain location: bilateral thumb CMC joint Pain description: aching  Aggravating  factors: overuse Relieving factors: heat, paraffin, rest  FALLS: Has patient fallen in last 6 months? No  LIVING ENVIRONMENT: Lives with: lives with their spouse   PLOF: Independent  PATIENT GOALS: prevent further tightness in hands    OBJECTIVE:   HAND DOMINANCE: Right  ADLs: Overall ADLs:  independent with all basic ADLs Transfers/ambulation related to ADLs: independent   FUNCTIONAL OUTCOME MEASURES: Upper Extremity Functional Scale (UEFS): 56/80=70%, with 30% limitation  UPPER EXTREMITY ROM:   Pt demonstrates full composite finger flexion and extension bilaterally     Active ROM Right eval Left eval  Thumb MCP (0-60) 40 50  Thumb IP (0-80) 75 80  Thumb Radial abd/add (0-55) 65  75  Thumb Palmar abd/add (0-45)     Thumb Opposition to Small Finger WFL WFL  Index MCP (0-90)     Index PIP (0-100)     Index DIP (0-70)      Long MCP (0-90)      Long PIP (0-100)      Long DIP (0-70)      Ring MCP (0-90)      Ring PIP (0-100)      Ring DIP (0-70)      Little MCP (0-90)      Little PIP (0-100)      Little DIP (0-70)      (Blank rows = not tested)    HAND FUNCTION: Grip strength: Right: 33 lbs; Left: 35 lbs    SENSATION: WFL  Observations: cording noted in R palm > than left particularly at middle and ring fingers right hand     TODAY'S TREATMENT:                                                                                                                              DATE: 08/31/22- [place] 3 mhz, 0.8 w/cm 2, 20% x 8 mins to palm and thenar eminence for right then left UE's for tightness in palm and pain at thenar eminence. Pain reduced to 4/10- following performance Massage to right and left palms due to tightness, no adverse reactions Gentle thumb flexion and circles within tolerated ROM. Tendon gliding to RUE, followed by passive finger extension stretch. Pt with what appears to be 1 nodule/ area of tighness  in left plam today at base of first  digit. Discussed importance of wearing thumb braces with repetative gripping and pinching activities to minimize pain.  08/04/22- Paraffin to RUE  x 10 mins and hotpack applied to left UE( due to mosquito bite with open place.) no adverse reactions. Reviewed previously issued exercises and added exercises 1, 2, 3, 4 from [place] protocol- page 25) 5-10 reps each Massage to right palm followed by [place] , 0.8 w/cm 2, 20% x 8 mins  to right palm and thenar eminence for pain and tightness. No adverse reactions. Pt was encouraged to avoid tight gripping and to wrap golf clubs with overgrip. Pt was encouraged to wear her thumb braces when she is performing repetitive activities   07/13/22 Paraffin to bilateral UE's x 8 mins for stiffness and pain, no adverse reactions. Pt was instructed in A/ROM exercises from [place] protocol 6, 7, 8, 9, 10  for bilateral UE's min-mod v.c and demonstration   PATIENT EDUCATION: Education details: thumb flexion/ extension, thumb circles Person educated: Patient Education method: Explanation, Demonstration, Tactile cues, Verbal cues,  Education comprehension: verbalized understanding, returned demonstration, and verbal cues required  HOME EXERCISE PROGRAM: [place] protocol 6, 7, 8, 9, 10 for dupetryn's 08/04/22-Added exercises 1-4 page 25,   GOALS: Goals reviewed with patient? Yes  SHORT TERM GOALS: Target date: 08/11/22  I with initial HEP Baseline: Goal status: met 08/04/22  2.  I with positioning, including splint wear to minimize pain and risk for contractures Baseline:  Goal status: ongoing 08/04/22, pt to bring in splints next visit    LONG TERM GOALS: Target date: 10/05/22  I with updated HEP Baseline:  Goal status: INITIAL  2.  I with AE and activity modification to minimize pain and  increase safety and independence with ADLs/IADLS. Baseline:  Goal status: INITIAL  3.  Pt will demonstrate improved UE function as evidenced by improving her  UEFS score by 4 points. Baseline:  Goal status: INITIAL   CLINICAL IMPRESSION: Patient is progressing towards goals. She reports her hands are looser but she has thumb pain today after overuse this weekend without wearing braces.  PERFORMANCE DEFICITS: in functional skills including ADLs, IADLs, coordination, dexterity, ROM, strength, pain, fascial restrictions, flexibility, decreased knowledge of precautions, decreased knowledge of use of DME, and UE functional use, cognitive skills including , and psychosocial skills including coping strategies, environmental adaptation, habits, interpersonal interactions, and routines and behaviors.   IMPAIRMENTS: are limiting patient from ADLs, IADLs, play, leisure, and social participation.   COMORBIDITIES: may have co-morbidities  that affects occupational performance. Patient will benefit from skilled OT to address above impairments and improve overall function.  MODIFICATION OR ASSISTANCE TO COMPLETE EVALUATION: No modification of tasks or assist necessary to complete an evaluation.  OT OCCUPATIONAL PROFILE AND HISTORY: Detailed assessment: Review of records and additional review of physical, cognitive, psychosocial history related to current functional performance.  CLINICAL DECISION MAKING: LOW - limited treatment options, no task modification necessary  REHAB POTENTIAL: Good  EVALUATION COMPLEXITY: Low      PLAN:  OT FREQUENCY: 1x/week  OT DURATION: 12 weeks  PLANNED INTERVENTIONS: self care/ADL training, therapeutic exercise, therapeutic activity, neuromuscular re-education, manual therapy, scar mobilization, passive range of motion, splinting, electrical stimulation, ultrasound, iontophoresis, paraffin, fluidotherapy, moist heat, cryotherapy, contrast bath, patient/family education, energy conservation, coping strategies training, DME and/or AE instructions, and Re-evaluation  RECOMMENDED OTHER SERVICES: none  CONSULTED AND AGREED  WITH PLAN OF CARE: Patient  PLAN FOR NEXT SESSION:  [place], massage, add to HEP prn   Illona Bulman, OT 08/31/2022, 3:13 PM

## 2022-08-31 NOTE — Telephone Encounter (Signed)
Pt called and said she feels better but wanted to know should she continue taking a half pill or go back taking a whole pill of metformin

## 2022-09-07 ENCOUNTER — Telehealth: Payer: Self-pay | Admitting: Cardiovascular Disease

## 2022-09-07 NOTE — Telephone Encounter (Signed)
Yes, we can do that.

## 2022-09-07 NOTE — Telephone Encounter (Signed)
Patient is calling wanting to know if a lab order can be placed for her to have her liver checked at her appt tomorrow. Please advise.

## 2022-09-07 NOTE — Telephone Encounter (Signed)
Returned pt's call to advise her question will be sent to the provider and his nurse. Pt verbalized understanding.

## 2022-09-08 ENCOUNTER — Encounter: Payer: Self-pay | Admitting: Cardiovascular Disease

## 2022-09-08 ENCOUNTER — Ambulatory Visit: Payer: Medicare PPO | Attending: Cardiovascular Disease | Admitting: Cardiovascular Disease

## 2022-09-08 VITALS — BP 122/70 | HR 71 | Ht 61.5 in | Wt 113.0 lb

## 2022-09-08 DIAGNOSIS — R931 Abnormal findings on diagnostic imaging of heart and coronary circulation: Secondary | ICD-10-CM

## 2022-09-08 DIAGNOSIS — I491 Atrial premature depolarization: Secondary | ICD-10-CM | POA: Diagnosis not present

## 2022-09-08 DIAGNOSIS — E785 Hyperlipidemia, unspecified: Secondary | ICD-10-CM

## 2022-09-08 DIAGNOSIS — R7303 Prediabetes: Secondary | ICD-10-CM | POA: Diagnosis not present

## 2022-09-08 LAB — LIPID PANEL
Chol/HDL Ratio: 1.9 ratio (ref 0.0–4.4)
Cholesterol, Total: 141 mg/dL (ref 100–199)
HDL: 73 mg/dL (ref >39)
LDL Chol Calc (NIH): 50 mg/dL (ref 0–99)
Triglycerides: 96 mg/dL (ref 0–149)
VLDL Cholesterol Cal: 18 mg/dL (ref 5–40)

## 2022-09-08 NOTE — Patient Instructions (Signed)
Medication Instructions:  No changes *If you need a refill on your cardiac medications before your next appointment, please call your pharmacy*  Lab Work: Lipid panel- today If you have labs (blood work) drawn today and your tests are completely normal, you will receive your results only by: MyChart Message (if you have MyChart) OR A paper copy in the mail If you have any lab test that is abnormal or we need to change your treatment, we will call you to review the results.  Follow-Up: At [facility], you and your health needs are our priority.  As part of our continuing mission to provide you with exceptional heart care, we have created designated Provider Care Teams.  These Care Teams include your primary Cardiologist (physician) and Advanced Practice Providers (APPs -  Physician Assistants and Nurse Practitioners) who all work together to provide you with the care you need, when you need it.  We recommend signing up for the patient portal called "MyChart".  Sign up information is provided on this After Visit Summary.  MyChart is used to connect with patients for Virtual Visits (Telemedicine).  Patients are able to view lab/test results, encounter notes, upcoming appointments, etc.  Non-urgent messages can be sent to your provider as well.   To learn more about what you can do with MyChart, go to [email or web address].    Your next appointment:   1 year(s)  Provider:   Thurmon Fair, MD

## 2022-09-08 NOTE — Progress Notes (Signed)
Cardiology Consultation Note:    Date:  09/08/2022   ID:  [name], DOB [birth year], MRN [id number]  PCP:  Mliss Sax, MD   [facility]  Cardiologist:  Thurmon Fair, MD NEW (remotely, Dr. Charlies Constable) Advanced Practice Provider:  No care team member to display Electrophysiologist:  None       Referring MD: Mliss Sax,*   Chief Complaint  Patient presents with   coronary calcification     History of Present Illness:    [name] is a 72 y.o. female with a hx of PACs and paroxysmal atrial tachycardia, borderline diabetes mellitus and elevated coronary calcium score.  Palpitations have bothered her infrequently.  She takes half tablet of atenolol with good relief, as needed.  The patient specifically denies any chest pain at rest exertion, dyspnea at rest or with exertion, orthopnea, paroxysmal nocturnal dyspnea, syncope, sustained palpitations, focal neurological deficits, intermittent claudication, lower extremity edema, unexplained weight gain, cough, hemoptysis or wheezing.  Past medical history is significant for the absence of major chronic illnesses including no diabetes, hypertension, hypercholesterolemia, CAD, PVD, stroke or TIA, smoking.  She does have a family history of coronary problems.  She is very lean with a BMI of 21 and exercises regularly.  On the other hand, her diet does contain relatively high amounts of saturated fat from burgers/steaks and high glycemic index carbohydrates such as potatoes.  Most recent hemoglobin A1c was mildly elevated at 5.9% and although her lipid profile was not bad at all (total cholesterol 175, HDL 68, LDL 84), her coronary calcium score was elevated at 251 (79%).  Following that result she started taking rosuvastatin 10 mg once daily.  In the 1990s she had been told that she has mitral valve prolapse, but this was not confirmed on the most recent study.  She has no evidence of  mitral valve prolapse on subsequent imaging studies.  She had a normal stress echo in 2020.  Past Medical History:  Diagnosis Date   Anal fissure    Anemia    during pregnancy   Anxiety    Basal cell carcinoma    DDD (degenerative disc disease)    Diverticulosis    Hypokalemia    LBP (low back pain)    Melanoma (HCC)    Migraine    MVP (mitral valve prolapse) 1990   psa   Rosacea    Ulcer    as a child    Past Surgical History:  Procedure Laterality Date   ABDOMINAL HYSTERECTOMY  1985   BREAST BIOPSY Left    secondary to infection   I & D EXTREMITY Right 09/09/2012   Procedure: Minor IRRIGATION AND DEBRIDEMENT EXTREMITY paronychia of right thumb;  Surgeon: Nicki Reaper, MD;  Location: [facility];  Service: Orthopedics;  Laterality: Right;   I & D EXTREMITY Left 01/26/2014   Procedure: MINOR IINCISION AND DRAINAGE paronychia left index finger;  Surgeon: Cindee Salt, MD;  Location: [facility];  Service: Orthopedics;  Laterality: Left;  left index   OOPHORECTOMY Left 1994   REPAIR KNEE LIGAMENT Left 1989   TONSILLECTOMY  1955   TUBAL LIGATION     VEIN SURGERY      Current Medications: Current Meds  Medication Sig   atenolol (TENORMIN) 25 MG tablet TAKE 1/2 TABLET(12.5 MG) BY MOUTH DAILY AS NEEDED FOR PALPITATIONS   calcium-vitamin D 250-100 MG-UNIT per tablet Take 1 tablet by mouth daily.  Coenzyme Q10 (CO Q10) 100 MG CAPS Take by mouth daily.   Doxylamine Succinate, Sleep, (SLEEP-AID PO) Take by mouth. [employer] sleep aid   estradiol (ESTRACE) 0.5 MG tablet Take 0.5 mg by mouth daily.   hydrocortisone (ANUSOL-HC) 2.5 % rectal cream Place 1 Application rectally 2 (two) times daily. (Patient taking differently: Place 1 Application rectally 2 (two) times daily. As needed)   meclizine (ANTIVERT) 12.5 MG tablet meclizine 12.5 mg tablet  one tablet every 6-12 hours as needed for dizziness   metFORMIN (GLUCOPHAGE) 500 MG tablet Take 0.5 tablets (250 mg  total) by mouth daily. Take half a tablet every morning with a meal.   Multiple Vitamin (MULTIVITAMIN) tablet Take 1 tablet by mouth daily.   omeprazole (PRILOSEC) 20 MG capsule Take 20 mg by mouth daily.   Phenyleph-Diphenhyd-Hydrocod (HYDRO-DP PO) Take by mouth. Hydroeye   rosuvastatin (CRESTOR) 10 MG tablet Take 1 tablet (10 mg total) by mouth daily.   SAMBUCOL BLACK ELDERBERRY PO Take 1 capsule by mouth 2 (two) times a week.     Allergies:   Codeine, Compazine, Gabapentin, Ondansetron hcl, Other, Prednisone, Prochlorperazine edisylate, Sulfa antibiotics, and Sulfonamide derivatives   Social History   Socioeconomic History   Marital status: Married    Spouse name: Not on file   Number of children: 2   Years of education: Not on file   Highest education level: Not on file  Occupational History   Occupation: retired school system  Tobacco Use   Smoking status: Never   Smokeless tobacco: Never  Vaping Use   Vaping status: Never Used  Substance and Sexual Activity   Alcohol use: Yes    Alcohol/week: 1.0 standard drink of alcohol    Types: 1 Standard drinks or equivalent per week    Comment: social wine   Drug use: No   Sexual activity: Yes  Other Topics Concern   Not on file  Social History Narrative   Not on file   Social Determinants of Health   Financial Resource Strain: Low Risk  (12/16/2021)   Overall Financial Resource Strain (CARDIA)    Difficulty of Paying Living Expenses: Not hard at all  Food Insecurity: No Food Insecurity (12/16/2021)   Hunger Vital Sign    Worried About Running Out of Food in the Last Year: Never true    Ran Out of Food in the Last Year: Never true  Transportation Needs: No Transportation Needs (12/16/2021)   PRAPARE - Administrator, Civil Service (Medical): No    Lack of Transportation (Non-Medical): No  Physical Activity: Inactive (12/16/2021)   Exercise Vital Sign    Days of Exercise per Week: 0 days    Minutes of Exercise  per Session: 0 min  Stress: No Stress Concern Present (12/16/2021)   [employer] of Occupational Health - Occupational Stress Questionnaire    Feeling of Stress : Not at all  Social Connections: Socially Integrated (10/08/2020)   Social Connection and Isolation Panel [NHANES]    Frequency of Communication with Friends and Family: More than three times a week    Frequency of Social Gatherings with Friends and Family: More than three times a week    Attends Religious Services: More than 4 times per year    Active Member of [employer] or Organizations: Yes    Attends Banker Meetings: 1 to 4 times per year    Marital Status: Married     Family History: The patient's family history includes Colitis  in her father; Colon polyps in her father and mother; Esophageal cancer in her maternal aunt; Heart disease in her father and mother. There is no history of Colon cancer.  ROS:   Please see the history of present illness.     All other systems reviewed and are negative.  EKGs/Labs/Other Studies Reviewed:    The following studies were reviewed today: Echo 2018, stress echo 2020 Multiple self recorded rhythm strips from her smart watch.  EKG:  EKG is  ordered today.  Normal sinus rhythm, normal tracing, QTc 412 ms  Recent Labs: 05/20/2022: TSH 1.10 08/27/2022: ALT 16; BUN 20; Creatinine, Ser 0.73; Hemoglobin 13.1; Platelets 286.0; Potassium 4.1; Sodium 139  Recent Lipid Panel    Component Value Date/Time   CHOL 175 05/20/2022 0918   TRIG 111.0 05/20/2022 0918   HDL 68.30 05/20/2022 0918   CHOLHDL 3 05/20/2022 0918   VLDL 22.2 05/20/2022 0918   LDLCALC 84 05/20/2022 0918     Risk Assessment/Calculations:       Physical Exam:    VS:  BP 122/70 (BP Location: Left Arm, Patient Position: Sitting)   Pulse 71   Ht 5' 1.5" (1.562 m)   Wt 113 lb (51.3 kg)   SpO2 (!) 0%   BMI 21.01 kg/m     Wt Readings from Last 3 Encounters:  09/08/22 113 lb (51.3 kg)  08/27/22 113 lb  (51.3 kg)  08/03/22 114 lb 6.4 oz (51.9 kg)     General: Alert, oriented x3, no distress, appears very lean and fit and younger than stated age Head: no evidence of trauma, PERRL, EOMI, no exophtalmos or lid lag, no myxedema, no xanthelasma; normal ears, nose and oropharynx Neck: normal jugular venous pulsations and no hepatojugular reflux; brisk carotid pulses without delay and no carotid bruits Chest: clear to auscultation, no signs of consolidation by percussion or palpation, normal fremitus, symmetrical and full respiratory excursions Cardiovascular: normal position and quality of the apical impulse, regular rhythm, normal first and second heart sounds, no murmurs, rubs or gallops Abdomen: no tenderness or distention, no masses by palpation, no abnormal pulsatility or arterial bruits, normal bowel sounds, no hepatosplenomegaly Extremities: no clubbing, cyanosis or edema; 2+ radial, ulnar and brachial pulses bilaterally; 2+ right femoral, posterior tibial and dorsalis pedis pulses; 2+ left femoral, posterior tibial and dorsalis pedis pulses; no subclavian or femoral bruits Neurological: grossly nonfocal Psych: Normal mood and affect    ASSESSMENT:    1. Premature atrial contractions   2. Dyslipidemia   3. Prediabetes   4. Elevated coronary artery calcium score       PLAN:    In order of problems listed above:   Palpitations: Satisfactory symptom control with as needed beta-blocker.  Her Apple Watch consistently shows that her palpitations are related to isolated premature atrial contractions, sometimes occurring a few times a minute.  No atrial fibrillation or atrial tachycardia has been detected.  Offered to switch from atenolol to an alternative agent such as a central acting calcium channel blocker, but she would rather stick with the current treatment for the time being. PreDM: Once again reviewed importance of avoiding sugary drinks (she drinks orange juice), high glucose  containing fruit (her favorite fruit was grapes) and high glycemic index starches such as from potatoes.  She is quite lean and exercises regularly.  Recheck A1c.  Despite her good lipid profile, this raises the likelihood that she has an atherogenic (small dense LDL) pattern. Coronary calcification: This was rather surprising.  He scores in the 79th percentile and this essentially doubles her estimated cardiovascular risk (from around 4% to almost 8% 10-year risk).  We have started treatment rosuvastatin.  Target LDL less than 70.  Recheck a lipid profile today.  Discussed the pathophysiology of plaque growth and plaque rupture and evaluate this this is a prognostic rather than a diagnostic test.       Medication Adjustments/Labs and Tests Ordered: Current medicines are reviewed at length with the patient today.  Concerns regarding medicines are outlined above.  Orders Placed This Encounter  Procedures   Lipid panel   No orders of the defined types were placed in this encounter.   Patient Instructions  Medication Instructions:  No changes *If you need a refill on your cardiac medications before your next appointment, please call your pharmacy*  Lab Work: Lipid panel- today If you have labs (blood work) drawn today and your tests are completely normal, you will receive your results only by: MyChart Message (if you have MyChart) OR A paper copy in the mail If you have any lab test that is abnormal or we need to change your treatment, we will call you to review the results.  Follow-Up: At [facility], you and your health needs are our priority.  As part of our continuing mission to provide you with exceptional heart care, we have created designated Provider Care Teams.  These Care Teams include your primary Cardiologist (physician) and Advanced Practice Providers (APPs -  Physician Assistants and Nurse Practitioners) who all work together to provide you with the care you need,  when you need it.  We recommend signing up for the patient portal called "MyChart".  Sign up information is provided on this After Visit Summary.  MyChart is used to connect with patients for Virtual Visits (Telemedicine).  Patients are able to view lab/test results, encounter notes, upcoming appointments, etc.  Non-urgent messages can be sent to your provider as well.   To learn more about what you can do with MyChart, go to [email or web address].    Your next appointment:   1 year(s)  Provider:   Thurmon Fair, MD        Signed, Thurmon Fair, MD  09/08/2022 11:00 AM    [facility]

## 2022-09-09 ENCOUNTER — Encounter (INDEPENDENT_AMBULATORY_CARE_PROVIDER_SITE_OTHER): Payer: Self-pay

## 2022-09-21 ENCOUNTER — Ambulatory Visit: Payer: Medicare PPO | Admitting: Cardiovascular Disease

## 2022-09-22 ENCOUNTER — Ambulatory Visit: Payer: Medicare PPO | Attending: Family Medicine | Admitting: Occupational Therapy

## 2022-09-22 DIAGNOSIS — M25542 Pain in joints of left hand: Secondary | ICD-10-CM | POA: Diagnosis not present

## 2022-09-22 DIAGNOSIS — M25641 Stiffness of right hand, not elsewhere classified: Secondary | ICD-10-CM | POA: Insufficient documentation

## 2022-09-22 DIAGNOSIS — M25541 Pain in joints of right hand: Secondary | ICD-10-CM | POA: Diagnosis not present

## 2022-09-22 DIAGNOSIS — M6281 Muscle weakness (generalized): Secondary | ICD-10-CM | POA: Diagnosis not present

## 2022-09-22 DIAGNOSIS — M25642 Stiffness of left hand, not elsewhere classified: Secondary | ICD-10-CM | POA: Insufficient documentation

## 2022-09-22 NOTE — Therapy (Signed)
OUTPATIENT OCCUPATIONAL THERAPY ORTHO Treatment   Patient Name: [name] MRN: [id number] DOB:1950/09/08, 72 y.o., female Today's Date: 09/22/2022  PCP: Dr. Doreene Burke REFERRING PROVIDER: Dr. Doreene Burke  END OF SESSION:  OT End of Session - 09/22/22 1437     Visit Number 4    Number of Visits 12    Authorization Time Period 90 days    Authorization - Visit Number 1    Authorization - Number of Visits 10    Progress Note Due on Visit 10    OT Start Time 1015    OT Stop Time 1058    OT Time Calculation (min) 43 min                Past Medical History:  Diagnosis Date   Anal fissure    Anemia    during pregnancy   Anxiety    Basal cell carcinoma    DDD (degenerative disc disease)    Diverticulosis    Hypokalemia    LBP (low back pain)    Melanoma (HCC)    Migraine    MVP (mitral valve prolapse) 1990   psa   Rosacea    Ulcer    as a child   Past Surgical History:  Procedure Laterality Date   ABDOMINAL HYSTERECTOMY  1985   BREAST BIOPSY Left    secondary to infection   I & D EXTREMITY Right 09/09/2012   Procedure: Minor IRRIGATION AND DEBRIDEMENT EXTREMITY paronychia of right thumb;  Surgeon: Nicki Reaper, MD;  Location: [facility];  Service: Orthopedics;  Laterality: Right;   I & D EXTREMITY Left 01/26/2014   Procedure: MINOR IINCISION AND DRAINAGE paronychia left index finger;  Surgeon: Cindee Salt, MD;  Location: [facility];  Service: Orthopedics;  Laterality: Left;  left index   OOPHORECTOMY Left 1994   REPAIR KNEE LIGAMENT Left 1989   TONSILLECTOMY  1955   TUBAL LIGATION     VEIN SURGERY     Patient Active Problem List   Diagnosis Date Noted   Agatston coronary artery calcium score between 200 and 399 08/03/2022   Osteopenia of necks of both femurs 06/23/2022   Dupuytren's contracture of both hands 06/23/2022   Low TSH level 05/20/2022   Prediabetes 05/20/2022   Cellulitis of finger of right hand 09/08/2021   Infection  of skin 05/13/2021   Cervicalgia 05/06/2021   Grieving 05/06/2021   Conductive hearing loss of right ear 03/04/2021   Impacted cerumen of right ear 03/04/2021   Medication side effect 03/03/2021   Excessive cerumen in right ear canal 03/03/2021   Labyrinthitis 07/30/2020   Palpitations 04/15/2020   Plantar fasciitis 04/15/2020   Dysthymia 04/15/2020   Vitamin D deficiency 04/15/2020   Gastroesophageal reflux disease 04/15/2020   Insect bite of left foot 11/24/2019   Pustule 11/24/2019   Pain of right thumb 03/09/2019   External hemorrhoids 03/03/2018   Other chest pain 02/23/2018   Insomnia 02/24/2017   Healthcare maintenance 02/24/2017   Conjunctivitis 03/01/2014   Seborrheic keratosis, inflamed 01/05/2012   Menopausal syndrome 12/22/2011   Encounter for counseling 11/11/2011   Skin cancer 07/14/2011   Basal cell carcinoma of face 03/10/2011   Migraine 05/14/2007   IRRITABLE BOWEL SYNDROME 05/14/2007   History of mitral valve prolapse 05/14/2007   Allergic rhinitis 04/07/2007   DERMATITIS DUE TO SOLAR RADIATION 04/07/2007   Premature beat 09/20/2006   DIVERTICULOSIS, COLON 04/13/2002    ONSET DATE: 06/25/22  REFERRING DIAG:  M72.0 (ICD-10-CM) - Dupuytren's contracture of both hands   THERAPY DIAG:  Stiffness of left hand, not elsewhere classified  Stiffness of right hand, not elsewhere classified  Muscle weakness (generalized)  Pain in joint of left hand  Rationale for Evaluation and Treatment: Rehabilitation  SUBJECTIVE:   SUBJECTIVE STATEMENT: Pt reports  she has been exercising Pt accompanied by: self  PERTINENT HISTORY: Dupuytren's contracture of both hands, osteopenia recently diagnosed, see active problem list above  PRECAUTIONS: None  WEIGHT BEARING RESTRICTIONS: No  PAIN:  Are you having pain? Yes: NPRS scale: 5-6/10 on arrival, 2/10 at end of session Pain location: bilateral thumb CMC joint Pain description: aching  Aggravating factors:  overuse Relieving factors: heat, paraffin, rest  FALLS: Has patient fallen in last 6 months? No  LIVING ENVIRONMENT: Lives with: lives with their spouse   PLOF: Independent  PATIENT GOALS: prevent further tightness in hands    OBJECTIVE:   HAND DOMINANCE: Right  ADLs: Overall ADLs:  independent with all basic ADLs Transfers/ambulation related to ADLs: independent   FUNCTIONAL OUTCOME MEASURES: Upper Extremity Functional Scale (UEFS): 56/80=70%, with 30% limitation  UPPER EXTREMITY ROM:   Pt demonstrates full composite finger flexion and extension bilaterally     Active ROM Right eval Left eval  Thumb MCP (0-60) 40 50  Thumb IP (0-80) 75 80  Thumb Radial abd/add (0-55) 65  75  Thumb Palmar abd/add (0-45)     Thumb Opposition to Small Finger WFL WFL  Index MCP (0-90)     Index PIP (0-100)     Index DIP (0-70)      Long MCP (0-90)      Long PIP (0-100)      Long DIP (0-70)      Ring MCP (0-90)      Ring PIP (0-100)      Ring DIP (0-70)      Little MCP (0-90)      Little PIP (0-100)      Little DIP (0-70)      (Blank rows = not tested)    HAND FUNCTION: Grip strength: Right: 33 lbs; Left: 35 lbs    SENSATION: WFL  Observations: cording noted in R palm > than left particularly at middle and ring fingers right hand     TODAY'S TREATMENT:                                                                                                                              DATE: [place] 3 mhz, 0.8 w/cm 2, 20% x 8 mins to each palm and thenar eminence for right then left UE's for tightness in palm and pain at thenar eminence. Pain reduced to 2/10- following performance. Paraffin to left hand and wrist x 8 mins while receiving [place] to RUE. No adverse reactions. Pain was reduced afterwards. Massage to right palm due to tightness, no adverse reactions Tendon gliding to RUE, followed by passive finger extension/ wrist extension  stretch, isolated active finger extension and  abduction on tabletop for bilateral UE's Pt brought in her thumb splint, they appear to fit well and provide good support. Discussed importance of avoiding repetitive gripping and pinching to minimize pain, and to carry groceries on shoulder or elbow.  08/31/22- [place] 3 mhz, 0.8 w/cm 2, 20% x 8 mins to palm and thenar eminence for right then left UE's for tightness in palm and pain at thenar eminence. Pain reduced to 4/10- following performance Massage to right and left palms due to tightness, no adverse reactions Gentle thumb flexion and circles within tolerated ROM. Tendon gliding to RUE, followed by passive finger extension stretch. Pt with what appears to be 1 nodule/ area of tighness in left plam today at base of first digit. Discussed importance of wearing thumb braces with repetative gripping and pinching activities to minimize pain.  08/04/22- Paraffin to RUE  x 10 mins and hotpack applied to left UE( due to mosquito bite with open place.) no adverse reactions. Reviewed previously issued exercises and added exercises 1, 2, 3, 4 from [place] protocol- page 25) 5-10 reps each Massage to right palm followed by [place] , 0.8 w/cm 2, 20% x 8 mins  to right palm and thenar eminence for pain and tightness. No adverse reactions. Pt was encouraged to avoid tight gripping and to wrap golf clubs with overgrip. Pt was encouraged to wear her thumb braces when she is performing repetitive activities   07/13/22 Paraffin to bilateral UE's x 8 mins for stiffness and pain, no adverse reactions. Pt was instructed in A/ROM exercises from [place] protocol 6, 7, 8, 9, 10  for bilateral UE's min-mod v.c and demonstration   PATIENT EDUCATION: Education details:  splint wear, suggestions for activity modification Person educated: Patient Education method: Explanation, Demonstration, Tactile cues, Verbal cues,  Education comprehension: verbalized understanding, returned demonstration, and verbal cues required  HOME  EXERCISE PROGRAM: [place] protocol 6, 7, 8, 9, 10 for dupetryn's 08/04/22-Added exercises 1-4 page 25,   GOALS: Goals reviewed with patient? Yes  SHORT TERM GOALS: Target date: 08/11/22  I with initial HEP Baseline: Goal status: met 08/04/22  2.  I with positioning, including splint wear to minimize pain and risk for contractures Baseline:  Goal status:  met 09/22/22  LONG TERM GOALS: Target date: 10/05/22  I with updated HEP  Goal status: ongoing 09/23/22  2.  I with AE and activity modification to minimize pain and  increase safety and independence with ADLs/IADLS. Baseline:  Goal status: ongoing, 09/23/22  3.  Pt will demonstrate improved UE function as evidenced by improving her UEFS score by 4 points.  Goal status:  ongoing, 09/23/22   CLINICAL IMPRESSION: Patient is progressing towards goals. She demonstrates decreased overall pain at end of session.  PERFORMANCE DEFICITS: in functional skills including ADLs, IADLs, coordination, dexterity, ROM, strength, pain, fascial restrictions, flexibility, decreased knowledge of precautions, decreased knowledge of use of DME, and UE functional use, cognitive skills including , and psychosocial skills including coping strategies, environmental adaptation, habits, interpersonal interactions, and routines and behaviors.   IMPAIRMENTS: are limiting patient from ADLs, IADLs, play, leisure, and social participation.   COMORBIDITIES: may have co-morbidities  that affects occupational performance. Patient will benefit from skilled OT to address above impairments and improve overall function.  MODIFICATION OR ASSISTANCE TO COMPLETE EVALUATION: No modification of tasks or assist necessary to complete an evaluation.  OT OCCUPATIONAL PROFILE AND HISTORY: Detailed assessment: Review of records and additional review of physical,  cognitive, psychosocial history related to current functional performance.  CLINICAL DECISION MAKING: LOW - limited  treatment options, no task modification necessary  REHAB POTENTIAL: Good  EVALUATION COMPLEXITY: Low      PLAN:  OT FREQUENCY: 1x/week  OT DURATION: 12 weeks  PLANNED INTERVENTIONS: self care/ADL training, therapeutic exercise, therapeutic activity, neuromuscular re-education, manual therapy, scar mobilization, passive range of motion, splinting, electrical stimulation, ultrasound, iontophoresis, paraffin, fluidotherapy, moist heat, cryotherapy, contrast bath, patient/family education, energy conservation, coping strategies training, DME and/or AE instructions, and Re-evaluation  RECOMMENDED OTHER SERVICES: none  CONSULTED AND AGREED WITH PLAN OF CARE: Patient  PLAN FOR NEXT SESSION:  [place], massage,  check goals, anticipate d/c next visit   Fayette Hamada, OT 09/22/2022, 2:38 PM

## 2022-09-30 ENCOUNTER — Encounter: Payer: Self-pay | Admitting: Family Medicine

## 2022-09-30 ENCOUNTER — Ambulatory Visit: Payer: Medicare PPO | Admitting: Family Medicine

## 2022-09-30 VITALS — BP 116/68 | HR 74 | Temp 97.8°F | Ht 61.0 in | Wt 114.2 lb

## 2022-09-30 DIAGNOSIS — M5431 Sciatica, right side: Secondary | ICD-10-CM | POA: Insufficient documentation

## 2022-09-30 DIAGNOSIS — F419 Anxiety disorder, unspecified: Secondary | ICD-10-CM | POA: Insufficient documentation

## 2022-09-30 DIAGNOSIS — E538 Deficiency of other specified B group vitamins: Secondary | ICD-10-CM | POA: Insufficient documentation

## 2022-09-30 DIAGNOSIS — R7303 Prediabetes: Secondary | ICD-10-CM

## 2022-09-30 LAB — HEMOGLOBIN A1C: Hgb A1c MFr Bld: 6 % (ref 4.6–6.5)

## 2022-09-30 NOTE — Progress Notes (Addendum)
Established Patient Office Visit   Subjective:  Patient ID: [name], female    DOB: 18-Dec-1950  Age: 72 y.o. MRN: [id number]  Chief Complaint  Patient presents with   Medical Management of Chronic Issues    3 months follow up.  Pt complains of nail discoloration Right sciatic pain Pt wants B12, Lipid and Liver     HPI Encounter Diagnoses  Name Primary?   Prediabetes Yes   B12 deficiency    Sciatica of right side    Anxiety    For follow-up of above.  Has been supplementing with 1000 mcg of B12.  Continues 250 mg of metformin immediate release each morning with breakfast.  Continues exercising.  After sitting for prolonged periods notices a pain radiating down the back of her right leg.  She denies back pain saddle paresthesias or weakness.  No change in bowel or bladder function.   Review of Systems  Constitutional: Negative.   HENT: Negative.    Eyes:  Negative for blurred vision, discharge and redness.  Respiratory: Negative.    Cardiovascular: Negative.   Gastrointestinal:  Negative for abdominal pain.  Genitourinary: Negative.   Musculoskeletal: Negative.  Negative for back pain and myalgias.  Skin:  Negative for rash.  Neurological:  Negative for tingling, loss of consciousness and weakness.  Endo/Heme/Allergies:  Negative for polydipsia.     Current Outpatient Medications:    Ascorbic Acid (VITAMIN C) 1000 MG tablet, , Disp: , Rfl:    atenolol (TENORMIN) 25 MG tablet, TAKE 1/2 TABLET(12.5 MG) BY MOUTH DAILY AS NEEDED FOR PALPITATIONS, Disp: 90 tablet, Rfl: 3   calcium-vitamin D 250-100 MG-UNIT per tablet, Take 1 tablet by mouth daily., Disp: , Rfl:    Coenzyme Q10 (CO Q10) 100 MG CAPS, Take by mouth daily., Disp: , Rfl:    Doxylamine Succinate, Sleep, (SLEEP-AID PO), Take by mouth. [employer] sleep aid, Disp: , Rfl:    estradiol (ESTRACE) 0.5 MG tablet, Take 0.5 mg by mouth daily., Disp: , Rfl:    hydrocortisone (ANUSOL-HC) 2.5 % rectal cream, Place 1  Application rectally 2 (two) times daily. (Patient taking differently: Place 1 Application rectally 2 (two) times daily. As needed), Disp: 30 g, Rfl: 0   L-LYSINE PO, Take 1 tablet by mouth 2 (two) times a week., Disp: , Rfl:    meclizine (ANTIVERT) 12.5 MG tablet, meclizine 12.5 mg tablet  one tablet every 6-12 hours as needed for dizziness, Disp: 30 tablet, Rfl: 1   meloxicam (MOBIC) 15 MG tablet, TAKE 1 TABLET BY MOUTH AS NEEDED, Disp: 30 tablet, Rfl: 1   metFORMIN (GLUCOPHAGE) 500 MG tablet, Take 0.5 tablets (250 mg total) by mouth daily. Take half a tablet every morning with a meal., Disp: 90 tablet, Rfl: 3   Multiple Vitamin (MULTIVITAMIN) tablet, Take 1 tablet by mouth daily., Disp: , Rfl:    omeprazole (PRILOSEC) 20 MG capsule, Take 20 mg by mouth daily., Disp: , Rfl:    Phenyleph-Diphenhyd-Hydrocod (HYDRO-DP PO), Take by mouth. Hydroeye, Disp: , Rfl:    RESTASIS 0.05 % ophthalmic emulsion, , Disp: , Rfl:    rosuvastatin (CRESTOR) 10 MG tablet, Take 1 tablet (10 mg total) by mouth daily., Disp: 90 tablet, Rfl: 3   SAMBUCOL BLACK ELDERBERRY PO, Take 1 capsule by mouth 2 (two) times a week., Disp: , Rfl:    zolmitriptan (ZOMIG-ZMT) 2.5 MG disintegrating tablet, TAKE 1 TABLET BY MOUTH AS NEEDED FOR MIGRAINE, Disp: 10 tablet, Rfl: 4   Objective:  BP 116/68   Pulse 74   Temp 97.8 F (36.6 C)   Ht 5\' 1"  (1.549 m)   Wt 114 lb 3.2 oz (51.8 kg)   SpO2 99%   BMI 21.58 kg/m    Physical Exam Constitutional:      General: She is not in acute distress.    Appearance: Normal appearance. She is not ill-appearing, toxic-appearing or diaphoretic.  HENT:     Head: Normocephalic and atraumatic.     Right Ear: External ear normal.     Left Ear: External ear normal.  Eyes:     General: No scleral icterus.       Right eye: No discharge.        Left eye: No discharge.     Extraocular Movements: Extraocular movements intact.     Conjunctiva/sclera: Conjunctivae normal.  Cardiovascular:      Rate and Rhythm: Normal rate and regular rhythm.  Pulmonary:     Effort: Pulmonary effort is normal. No respiratory distress.     Breath sounds: Normal breath sounds.  Abdominal:     General: Bowel sounds are normal.     Tenderness: There is no abdominal tenderness. There is no guarding.  Musculoskeletal:     Cervical back: No rigidity or tenderness.     Lumbar back: No bony tenderness. Decreased range of motion. Negative right straight leg raise test and negative left straight leg raise test.  Skin:    General: Skin is warm and dry.  Neurological:     Mental Status: She is alert and oriented to person, place, and time.     Motor: No weakness.  Psychiatric:        Mood and Affect: Mood normal.        Behavior: Behavior normal.      Results for orders placed or performed in visit on 09/30/22  Hemoglobin A1c  Result Value Ref Range   Hgb A1c MFr Bld 6.0 4.6 - 6.5 %  Vitamin B12  Result Value Ref Range   Vitamin B-12 741 211 - 911 pg/mL      The 10-year ASCVD risk score (Arnett DK, et al., 2019) is: 11.5%    Assessment & Plan:   Prediabetes -     Hemoglobin A1c -     Amb Referral to Nutrition and Diabetic Education  B12 deficiency -     Vitamin B12  Sciatica of right side -     Ambulatory referral to Physical Therapy  Anxiety    Return in about 3 months (around 12/31/2022).  Advised talking therapy and/or medication for her anxiety.  Patient declines at this time.  Rechecking A1c.  Continues with metformin.  Physical therapy referral for sciatica.  Mliss Sax, MD  8/23 addendum: Patient called out of concern that Glucophage has been dropping her blood sugar.  She is only taking half a pill.  Advised her to stop the medication for now and will refer for diabetic teaching.

## 2022-10-01 ENCOUNTER — Ambulatory Visit: Payer: Medicare PPO | Admitting: Occupational Therapy

## 2022-10-01 DIAGNOSIS — M25642 Stiffness of left hand, not elsewhere classified: Secondary | ICD-10-CM | POA: Diagnosis not present

## 2022-10-01 DIAGNOSIS — M25541 Pain in joints of right hand: Secondary | ICD-10-CM | POA: Diagnosis not present

## 2022-10-01 DIAGNOSIS — M6281 Muscle weakness (generalized): Secondary | ICD-10-CM

## 2022-10-01 DIAGNOSIS — M25641 Stiffness of right hand, not elsewhere classified: Secondary | ICD-10-CM | POA: Diagnosis not present

## 2022-10-01 DIAGNOSIS — M25542 Pain in joints of left hand: Secondary | ICD-10-CM | POA: Diagnosis not present

## 2022-10-01 NOTE — Therapy (Addendum)
OUTPATIENT OCCUPATIONAL THERAPY ORTHO Treatment   Patient Name: [name] MRN: [id number] DOB:Oct 24, 1950, 72 y.o., female Today's Date: 10/01/2022 OCCUPATIONAL THERAPY DISCHARGE SUMMARY   Current functional level related to goals / functional outcomes: Pt made excellent progress. She achieved all goals.   Remaining deficits: Pain, stiffness   Education / Equipment: Pt was instructed in HEP and activity modification. She demonstrates understanding.   Patient agrees to discharge. Patient goals were met. Patient is being discharged due to being pleased with the current functional level.Marland Kitchen    PCP: Dr. Doreene Burke REFERRING PROVIDER: Dr. Doreene Burke  END OF SESSION:  OT End of Session - 10/01/22 1319     Visit Number 5    Number of Visits 12    Date for OT Re-Evaluation 10/05/22    Authorization Type [employer]    Authorization Time Period 90 days    Progress Note Due on Visit 10    OT Start Time 1310    OT Stop Time 1355    OT Time Calculation (min) 45 min    Activity Tolerance Patient tolerated treatment well    Behavior During Therapy WFL for tasks assessed/performed                Past Medical History:  Diagnosis Date   Anal fissure    Anemia    during pregnancy   Anxiety    Basal cell carcinoma    DDD (degenerative disc disease)    Diverticulosis    Hypokalemia    LBP (low back pain)    Melanoma (HCC)    Migraine    MVP (mitral valve prolapse) 1990   psa   Rosacea    Ulcer    as a child   Past Surgical History:  Procedure Laterality Date   ABDOMINAL HYSTERECTOMY  1985   BREAST BIOPSY Left    secondary to infection   I & D EXTREMITY Right 09/09/2012   Procedure: Minor IRRIGATION AND DEBRIDEMENT EXTREMITY paronychia of right thumb;  Surgeon: Nicki Reaper, MD;  Location: [facility];  Service: Orthopedics;  Laterality: Right;   I & D EXTREMITY Left 01/26/2014   Procedure: MINOR IINCISION AND DRAINAGE paronychia left index finger;   Surgeon: Cindee Salt, MD;  Location: [facility];  Service: Orthopedics;  Laterality: Left;  left index   OOPHORECTOMY Left 1994   REPAIR KNEE LIGAMENT Left 1989   TONSILLECTOMY  1955   TUBAL LIGATION     VEIN SURGERY     Patient Active Problem List   Diagnosis Date Noted   Sciatica of right side 09/30/2022   B12 deficiency 09/30/2022   Anxiety 09/30/2022   Agatston coronary artery calcium score between 200 and 399 08/03/2022   Osteopenia of necks of both femurs 06/23/2022   Dupuytren's contracture of both hands 06/23/2022   Low TSH level 05/20/2022   Prediabetes 05/20/2022   Cellulitis of finger of right hand 09/08/2021   Infection of skin 05/13/2021   Cervicalgia 05/06/2021   Grieving 05/06/2021   Conductive hearing loss of right ear 03/04/2021   Impacted cerumen of right ear 03/04/2021   Medication side effect 03/03/2021   Excessive cerumen in right ear canal 03/03/2021   Labyrinthitis 07/30/2020   Palpitations 04/15/2020   Plantar fasciitis 04/15/2020   Dysthymia 04/15/2020   Vitamin D deficiency 04/15/2020   Gastroesophageal reflux disease 04/15/2020   Insect bite of left foot 11/24/2019   Pustule 11/24/2019   Pain of right thumb 03/09/2019  External hemorrhoids 03/03/2018   Other chest pain 02/23/2018   Insomnia 02/24/2017   Healthcare maintenance 02/24/2017   Conjunctivitis 03/01/2014   Seborrheic keratosis, inflamed 01/05/2012   Menopausal syndrome 12/22/2011   Encounter for counseling 11/11/2011   Skin cancer 07/14/2011   Basal cell carcinoma of face 03/10/2011   Migraine 05/14/2007   IRRITABLE BOWEL SYNDROME 05/14/2007   History of mitral valve prolapse 05/14/2007   Allergic rhinitis 04/07/2007   DERMATITIS DUE TO SOLAR RADIATION 04/07/2007   Premature beat 09/20/2006   DIVERTICULOSIS, COLON 04/13/2002    ONSET DATE: 06/25/22  REFERRING DIAG: M72.0 (ICD-10-CM) - Dupuytren's contracture of both hands   THERAPY DIAG:  Stiffness of left  hand, not elsewhere classified  Stiffness of right hand, not elsewhere classified  Muscle weakness (generalized)  Pain in joint of left hand  Pain in joint of right hand  Rationale for Evaluation and Treatment: Rehabilitation  SUBJECTIVE:   SUBJECTIVE STATEMENT: Pt reports her hands are sore from pulling up her pants Pt accompanied by: self  PERTINENT HISTORY: Dupuytren's contracture of both hands, osteopenia recently diagnosed, see active problem list above  PRECAUTIONS: None  WEIGHT BEARING RESTRICTIONS: No  PAIN:  Are you having pain? Yes: NPRS scale: 7/10 on arrival, 2/10 at end of session Pain location: bilateral thumb CMC joint Pain description: aching  Aggravating factors: overuse Relieving factors: heat, paraffin, rest  FALLS: Has patient fallen in last 6 months? No  LIVING ENVIRONMENT: Lives with: lives with their spouse   PLOF: Independent  PATIENT GOALS: prevent further tightness in hands    OBJECTIVE:   HAND DOMINANCE: Right  ADLs: Overall ADLs:  independent with all basic ADLs Transfers/ambulation related to ADLs: independent   FUNCTIONAL OUTCOME MEASURES: Upper Extremity Functional Scale (UEFS): 56/80=70%, with 30% limitation  UPPER EXTREMITY ROM:   Pt demonstrates full composite finger flexion and extension bilaterally     Active ROM Right eval Left eval  Thumb MCP (0-60) 40 50  Thumb IP (0-80) 75 80  Thumb Radial abd/add (0-55) 65  75  Thumb Palmar abd/add (0-45)     Thumb Opposition to Small Finger WFL WFL  Index MCP (0-90)     Index PIP (0-100)     Index DIP (0-70)      Long MCP (0-90)      Long PIP (0-100)      Long DIP (0-70)      Ring MCP (0-90)      Ring PIP (0-100)      Ring DIP (0-70)      Little MCP (0-90)      Little PIP (0-100)      Little DIP (0-70)      (Blank rows = not tested)    HAND FUNCTION: Grip strength: Right: 33 lbs; Left: 35 lbs    SENSATION: WFL  Observations: cording noted in R palm >  than left particularly at middle and ring fingers right hand      TODAY'S TREATMENT:  DATE: 10/01/22- Paraffin to bilateral UE's x 10 mins for pain and stiffness, no adverse reactions.[place] 3 mhz, 0.8 w/cm 2, 20% x 8 mins to palm and thenar eminence for right then left UE's for tightness in palm and pain at thenar eminence. Pain reduced following performance. Therapist checked progress towards goals and pt completed UEFS. Pt demonstrated figure 4 stretch for therapist as she was previously instructed by PT in this for pain. Pt verbalizes understanding of HEP and activity modifications.   [place] 3 mhz, 0.8 w/cm 2, 20% x 8 mins to each palm and thenar eminence for right then left UE's for tightness in palm and pain at thenar eminence. Pain reduced to 2/10- following performance. Paraffin to left hand and wrist x 8 mins while receiving [place] to RUE. No adverse reactions. Pain was reduced afterwards. Massage to right palm due to tightness, no adverse reactions Tendon gliding to RUE, followed by passive finger extension/ wrist extension stretch, isolated active finger extension and abduction on tabletop for bilateral UE's Pt brought in her thumb splint, they appear to fit well and provide good support. Discussed importance of avoiding repetitive gripping and pinching to minimize pain, and to carry groceries on shoulder or elbow.  08/31/22- [place] 3 mhz, 0.8 w/cm 2, 20% x 8 mins to palm and thenar eminence for right then left UE's for tightness in palm and pain at thenar eminence. Pain reduced to 4/10- following performance Massage to right and left palms due to tightness, no adverse reactions Gentle thumb flexion and circles within tolerated ROM. Tendon gliding to RUE, followed by passive finger extension stretch. Pt with what appears to be 1 nodule/ area of tighness in left plam today at base  of first digit. Discussed importance of wearing thumb braces with repetative gripping and pinching activities to minimize pain.  08/04/22- Paraffin to RUE  x 10 mins and hotpack applied to left UE( due to mosquito bite with open place.) no adverse reactions. Reviewed previously issued exercises and added exercises 1, 2, 3, 4 from [place] protocol- page 25) 5-10 reps each Massage to right palm followed by [place] , 0.8 w/cm 2, 20% x 8 mins  to right palm and thenar eminence for pain and tightness. No adverse reactions. Pt was encouraged to avoid tight gripping and to wrap golf clubs with overgrip. Pt was encouraged to wear her thumb braces when she is performing repetitive activities   07/13/22 Paraffin to bilateral UE's x 8 mins for stiffness and pain, no adverse reactions. Pt was instructed in A/ROM exercises from [place] protocol 6, 7, 8, 9, 10  for bilateral UE's min-mod v.c and demonstration   PATIENT EDUCATION: Education details:  splint wear, suggestions for activity modification, progress towards goals Person educated: Patient Education method: Explanation, Demonstration, Tactile cues, Verbal cues,  Education comprehension: verbalized understanding, returned demonstration, and verbal cues required  HOME EXERCISE PROGRAM: [place] protocol 6, 7, 8, 9, 10 for dupetryn's 08/04/22-Added exercises 1-4 page 25,   GOALS: Goals reviewed with patient? Yes  SHORT TERM GOALS: Target date: 08/11/22  I with initial HEP Baseline: Goal status: met 08/04/22  2.  I with positioning, including splint wear to minimize pain and risk for contractures Baseline:  Goal status:  met 09/22/22  LONG TERM GOALS: Target date: 10/05/22  I with updated HEP  Goal status:  met, 10/01/22  2.  I with AE and activity modification to minimize pain and  increase safety and independence with ADLs/IADLS. Baseline:  Goal status: met, 10/01/22  3.  Pt will demonstrate improved UE function as evidenced by improving  her UEFS score by 4 points.  Goal status:  met 73/80, 10/01/22   CLINICAL IMPRESSION:Pt met all goals and she agrees with plans for d/c.   PERFORMANCE DEFICITS: in functional skills including ADLs, IADLs, coordination, dexterity, ROM, strength, pain, fascial restrictions, flexibility, decreased knowledge of precautions, decreased knowledge of use of DME, and UE functional use, cognitive skills including , and psychosocial skills including coping strategies, environmental adaptation, habits, interpersonal interactions, and routines and behaviors.   IMPAIRMENTS: are limiting patient from ADLs, IADLs, play, leisure, and social participation.   COMORBIDITIES: may have co-morbidities  that affects occupational performance. Patient will benefit from skilled OT to address above impairments and improve overall function.  MODIFICATION OR ASSISTANCE TO COMPLETE EVALUATION: No modification of tasks or assist necessary to complete an evaluation.  OT OCCUPATIONAL PROFILE AND HISTORY: Detailed assessment: Review of records and additional review of physical, cognitive, psychosocial history related to current functional performance.  CLINICAL DECISION MAKING: LOW - limited treatment options, no task modification necessary  REHAB POTENTIAL: Good  EVALUATION COMPLEXITY: Low      PLAN:  OT FREQUENCY: 1x/week  OT DURATION: 12 weeks  PLANNED INTERVENTIONS: self care/ADL training, therapeutic exercise, therapeutic activity, neuromuscular re-education, manual therapy, scar mobilization, passive range of motion, splinting, electrical stimulation, ultrasound, iontophoresis, paraffin, fluidotherapy, moist heat, cryotherapy, contrast bath, patient/family education, energy conservation, coping strategies training, DME and/or AE instructions, and Re-evaluation  RECOMMENDED OTHER SERVICES: none  CONSULTED AND AGREED WITH PLAN OF CARE: Patient  PLAN FOR NEXT SESSION:   d/c OT   Eathan Groman,  OT 10/01/2022, 1:21 PM

## 2022-10-02 ENCOUNTER — Telehealth: Payer: Self-pay | Admitting: Family Medicine

## 2022-10-02 LAB — VITAMIN B12: Vitamin B-12: 741 pg/mL (ref 211–911)

## 2022-10-02 NOTE — Addendum Note (Signed)
Addended by: Andrez Grime on: 10/02/2022 04:50 PM   Modules accepted: Orders

## 2022-10-02 NOTE — Telephone Encounter (Signed)
Darel Hong [phone]  Pt stated that she has started taking 1/2 of her metformin pill due to it making her sugar drop and she would get shaky. She also wonders why her sugar went up from 5.9 to 6.0 at her last visit. She is careful watching what she eats no sugar in her food or drinks.  She is requesting a call from Dr Doreene Burke.

## 2022-10-04 ENCOUNTER — Encounter: Payer: Self-pay | Admitting: Family Medicine

## 2022-10-06 ENCOUNTER — Telehealth: Payer: Self-pay | Admitting: Family Medicine

## 2022-10-06 NOTE — Telephone Encounter (Signed)
Please call pt today she have a question about one of the medications

## 2022-10-06 NOTE — Telephone Encounter (Signed)
Pt said if dr Doreene Burke can look at the message she left in her my chart about her medication and respond or give her a call back

## 2022-10-19 ENCOUNTER — Telehealth: Payer: Self-pay | Admitting: Family Medicine

## 2022-10-19 ENCOUNTER — Ambulatory Visit: Payer: Medicare PPO | Admitting: Family Medicine

## 2022-10-19 NOTE — Telephone Encounter (Signed)
10/19/22 - Pt called wanting to speak with the nurse per her medication usage. She wants a call back asap at  [phone].

## 2022-10-20 NOTE — Telephone Encounter (Signed)
Spoke to patient she stated that she canceled her appt yesterday due to husbands sister passed away.  She will reschedule as soon as she can.   The question was in regards to the Metformin but whe will continue taking as prescribed till her appt. Dm/cma

## 2022-10-21 DIAGNOSIS — H2513 Age-related nuclear cataract, bilateral: Secondary | ICD-10-CM | POA: Diagnosis not present

## 2022-10-21 DIAGNOSIS — H00014 Hordeolum externum left upper eyelid: Secondary | ICD-10-CM | POA: Diagnosis not present

## 2022-10-21 DIAGNOSIS — H43813 Vitreous degeneration, bilateral: Secondary | ICD-10-CM | POA: Diagnosis not present

## 2022-10-21 DIAGNOSIS — H10823 Rosacea conjunctivitis, bilateral: Secondary | ICD-10-CM | POA: Diagnosis not present

## 2022-10-21 DIAGNOSIS — H524 Presbyopia: Secondary | ICD-10-CM | POA: Diagnosis not present

## 2022-10-21 LAB — HM DIABETES EYE EXAM

## 2022-10-23 ENCOUNTER — Ambulatory Visit: Payer: Medicare PPO | Admitting: Family Medicine

## 2022-10-23 ENCOUNTER — Encounter: Payer: Self-pay | Admitting: Family Medicine

## 2022-10-23 VITALS — BP 122/72 | HR 79 | Temp 97.9°F | Ht 61.0 in | Wt 114.0 lb

## 2022-10-23 DIAGNOSIS — Z23 Encounter for immunization: Secondary | ICD-10-CM

## 2022-10-23 DIAGNOSIS — R7303 Prediabetes: Secondary | ICD-10-CM | POA: Diagnosis not present

## 2022-10-23 NOTE — Progress Notes (Signed)
Established Patient Office Visit   Subjective:  Patient ID: [name], female    DOB: [birth year]  Age: 72 y.o. MRN: [id number]  Chief Complaint  Patient presents with   Medical Management of Chronic Issues    Follow up. Discuss Metformin.     HPI Encounter Diagnoses  Name Primary?   Prediabetes Yes   Need for immunization against influenza    For discussion of treatment of her prediabetes with an A1c at 6.0.  She continues to exercise daily.  She is mindful of carbohydrate intake.  Continues to be a little reluctant about taking the medication.  There has been some loose stool when she tried the immediate release form.   Review of Systems  Constitutional: Negative.   HENT: Negative.    Eyes:  Negative for blurred vision, discharge and redness.  Respiratory: Negative.    Cardiovascular: Negative.   Gastrointestinal:  Negative for abdominal pain.  Genitourinary: Negative.   Musculoskeletal: Negative.  Negative for myalgias.  Skin:  Negative for rash.  Neurological:  Negative for tingling, loss of consciousness and weakness.  Endo/Heme/Allergies:  Negative for polydipsia.     Current Outpatient Medications:    Ascorbic Acid (VITAMIN C) 1000 MG tablet, , Disp: , Rfl:    atenolol (TENORMIN) 25 MG tablet, TAKE 1/2 TABLET(12.5 MG) BY MOUTH DAILY AS NEEDED FOR PALPITATIONS, Disp: 90 tablet, Rfl: 3   calcium-vitamin D 250-100 MG-UNIT per tablet, Take 1 tablet by mouth daily., Disp: , Rfl:    Coenzyme Q10 (CO Q10) 100 MG CAPS, Take by mouth daily., Disp: , Rfl:    Doxylamine Succinate, Sleep, (SLEEP-AID PO), Take by mouth. [employer] sleep aid, Disp: , Rfl:    estradiol (ESTRACE) 0.5 MG tablet, Take 0.5 mg by mouth daily., Disp: , Rfl:    hydrocortisone (ANUSOL-HC) 2.5 % rectal cream, Place 1 Application rectally 2 (two) times daily. (Patient taking differently: Place 1 Application rectally 2 (two) times daily. As needed), Disp: 30 g, Rfl: 0   L-LYSINE PO, Take 1 tablet by mouth 2  (two) times a week., Disp: , Rfl:    meclizine (ANTIVERT) 12.5 MG tablet, meclizine 12.5 mg tablet  one tablet every 6-12 hours as needed for dizziness, Disp: 30 tablet, Rfl: 1   meloxicam (MOBIC) 15 MG tablet, TAKE 1 TABLET BY MOUTH AS NEEDED, Disp: 30 tablet, Rfl: 1   metFORMIN (GLUCOPHAGE) 500 MG tablet, Take 0.5 tablets (250 mg total) by mouth daily. Take half a tablet every morning with a meal., Disp: 90 tablet, Rfl: 3   Multiple Vitamin (MULTIVITAMIN) tablet, Take 1 tablet by mouth daily., Disp: , Rfl:    omeprazole (PRILOSEC) 20 MG capsule, Take 20 mg by mouth daily., Disp: , Rfl:    Phenyleph-Diphenhyd-Hydrocod (HYDRO-DP PO), Take by mouth. Hydroeye, Disp: , Rfl:    RESTASIS 0.05 % ophthalmic emulsion, , Disp: , Rfl:    rosuvastatin (CRESTOR) 10 MG tablet, Take 1 tablet (10 mg total) by mouth daily., Disp: 90 tablet, Rfl: 3   SAMBUCOL BLACK ELDERBERRY PO, Take 1 capsule by mouth 2 (two) times a week., Disp: , Rfl:    zolmitriptan (ZOMIG-ZMT) 2.5 MG disintegrating tablet, TAKE 1 TABLET BY MOUTH AS NEEDED FOR MIGRAINE, Disp: 10 tablet, Rfl: 4   Objective:     BP 122/72   Pulse 79   Temp 97.9 F (36.6 C)   Ht 5\' 1"  (1.549 m)   Wt 114 lb (51.7 kg)   SpO2 98%   BMI 21.54  kg/m  Wt Readings from Last 3 Encounters:  10/23/22 114 lb (51.7 kg)  09/30/22 114 lb 3.2 oz (51.8 kg)  09/08/22 113 lb (51.3 kg)      Physical Exam Constitutional:      General: She is not in acute distress.    Appearance: Normal appearance. She is not ill-appearing, toxic-appearing or diaphoretic.  HENT:     Head: Normocephalic and atraumatic.     Right Ear: External ear normal.     Left Ear: External ear normal.  Eyes:     General: No scleral icterus.       Right eye: No discharge.        Left eye: No discharge.     Extraocular Movements: Extraocular movements intact.     Conjunctiva/sclera: Conjunctivae normal.  Pulmonary:     Effort: Pulmonary effort is normal. No respiratory distress.  Skin:     General: Skin is warm and dry.  Neurological:     Mental Status: She is alert and oriented to person, place, and time.  Psychiatric:        Mood and Affect: Mood normal.        Behavior: Behavior normal.      No results found for any visits on 10/23/22.    The 10-year ASCVD risk score (Arnett DK, et al., 2019) is: 12.6%    Assessment & Plan:   Prediabetes  Need for immunization against influenza -     Flu vaccine HIGH DOSE PF; Future    Return in about 3 months (around 01/22/2023), or Glucophage XL 500 mg on Mondays Wednesdays and Fridays..  Will take Glucophage XL 500 mg nightly on Mondays Wednesdays and Fridays.  Continue exercising.  She does not need to lose any weight.  Follow-up in 3 months.  Mliss Sax, MD

## 2022-10-27 ENCOUNTER — Encounter: Payer: Self-pay | Admitting: Cardiovascular Disease

## 2022-10-27 NOTE — Telephone Encounter (Signed)
Spoke to patient she will have fasting lipid panel done 9/23.Advised order in chart.

## 2022-11-02 ENCOUNTER — Other Ambulatory Visit: Payer: Self-pay | Admitting: Cardiovascular Disease

## 2022-11-02 DIAGNOSIS — R931 Abnormal findings on diagnostic imaging of heart and coronary circulation: Secondary | ICD-10-CM | POA: Diagnosis not present

## 2022-11-02 LAB — LIPID PANEL
Chol/HDL Ratio: 2 ratio (ref 0.0–4.4)
Cholesterol, Total: 142 mg/dL (ref 100–199)
HDL: 71 mg/dL (ref >39)
LDL Chol Calc (NIH): 57 mg/dL (ref 0–99)
Triglycerides: 69 mg/dL (ref 0–149)
VLDL Cholesterol Cal: 14 mg/dL (ref 5–40)

## 2022-11-05 ENCOUNTER — Telehealth: Payer: Self-pay | Admitting: Cardiovascular Disease

## 2022-11-05 DIAGNOSIS — R7303 Prediabetes: Secondary | ICD-10-CM

## 2022-11-05 DIAGNOSIS — R931 Abnormal findings on diagnostic imaging of heart and coronary circulation: Secondary | ICD-10-CM

## 2022-11-05 DIAGNOSIS — E785 Hyperlipidemia, unspecified: Secondary | ICD-10-CM

## 2022-11-05 NOTE — Telephone Encounter (Signed)
Spoke with patient.  Results of Lipid sent to PCP.  She ask to have her Lipid repeated in 3 months.  Order placed and she will have done after the Christmas Holiday t see if had any effect on her levels.

## 2022-11-05 NOTE — Telephone Encounter (Signed)
Patient calling for [place] to send a copy of her labs results to her PCP office and to OBGYN dr.  And to see if she should continue on her medication. Please advise

## 2022-11-10 ENCOUNTER — Telehealth: Payer: Self-pay | Admitting: Family Medicine

## 2022-11-10 NOTE — Telephone Encounter (Signed)
Pt said she got sent to a call center somehow and then directed back to the office. Thinks this is recurrence of veritgo and she has some meclazine on hand. They are traveling/on the road now but she will check BP when she gets home and call tomorrow since it will be after 5p.

## 2022-11-10 NOTE — Telephone Encounter (Signed)
11/10/22 - Pt called complaining of dizziness since the weekend. I transferred her to NT.

## 2022-11-13 ENCOUNTER — Telehealth: Payer: Self-pay

## 2022-11-13 ENCOUNTER — Encounter: Payer: Self-pay | Admitting: Family Medicine

## 2022-11-13 ENCOUNTER — Ambulatory Visit: Payer: Medicare PPO | Admitting: Family Medicine

## 2022-11-13 VITALS — BP 118/76 | HR 74 | Temp 97.6°F | Ht 61.0 in | Wt 114.8 lb

## 2022-11-13 DIAGNOSIS — S161XXA Strain of muscle, fascia and tendon at neck level, initial encounter: Secondary | ICD-10-CM | POA: Diagnosis not present

## 2022-11-13 DIAGNOSIS — H8309 Labyrinthitis, unspecified ear: Secondary | ICD-10-CM

## 2022-11-13 DIAGNOSIS — M160 Bilateral primary osteoarthritis of hip: Secondary | ICD-10-CM | POA: Diagnosis not present

## 2022-11-13 DIAGNOSIS — M47817 Spondylosis without myelopathy or radiculopathy, lumbosacral region: Secondary | ICD-10-CM | POA: Diagnosis not present

## 2022-11-13 DIAGNOSIS — M25552 Pain in left hip: Secondary | ICD-10-CM

## 2022-11-13 DIAGNOSIS — M25551 Pain in right hip: Secondary | ICD-10-CM | POA: Diagnosis not present

## 2022-11-13 NOTE — Patient Outreach (Addendum)
  Care Coordination   In Person Provider Office Visit Note   11/13/2022 Name: CLARECE DRZEWIECKI MRN: [id number] DOB: 18-Jul-1950  DALYCE RENNE is a 72 y.o. year old female who sees Mliss Sax, MD for primary care. I engaged with Burnett Kanaris in the providers office today.  What matters to the patients health and wellness today?  none    Goals Addressed             This Visit's Progress    COMPLETED: Care Coordination Activities-no follow up required       Care Coordination Interventions: Advised patient to Annual Wellness exam. Discussed [facility] services and support. Assessed SDOH. Advised to discuss with primary care physician if services needed in the future.          SDOH assessments and interventions completed:  Yes  SDOH Interventions Today    Flowsheet Row Most Recent Value  SDOH Interventions   Health Literacy Interventions Intervention Not Indicated        Care Coordination Interventions:  Yes, provided   Follow up plan: No further intervention required.   Encounter Outcome:  Patient Visit Completed   Bary Leriche, RN, MSN [facility]  [facility], [facility] Management Community Coordinator Direct Dial: [phone]  Fax: [phone] Website: [email or web address]

## 2022-11-13 NOTE — Progress Notes (Signed)
Established Patient Office Visit   Subjective:  Patient ID: [name], female    DOB: March 16, 1950  Age: 72 y.o. MRN: [id number]  Chief Complaint  Patient presents with   Dizziness    Dizziness and soreness in back of neck.     Dizziness Associated symptoms include neck pain. Pertinent negatives include no abdominal pain, myalgias, rash or weakness.   Encounter Diagnoses  Name Primary?   Labyrinthitis, unspecified laterality Yes   Pain of left hip    Strain of neck muscle, initial encounter    Presents with a history of experiencing a spinning sensation when she looks up.  Ongoing history of intermittent arthritis.  Has not bothered her for several months now.  No recent cold or flu.  There is tightness in the left side of her neck.  She also is experiencing tightness in her left thigh area.  There is a pain that seems to move from her buttock around the front of her thigh into her groin area.  No injury.  She has been walking and playing pickle ball for physical activity   Review of Systems  Constitutional: Negative.   HENT: Negative.    Eyes:  Negative for blurred vision, discharge and redness.  Respiratory: Negative.    Cardiovascular: Negative.   Gastrointestinal:  Negative for abdominal pain.  Genitourinary: Negative.   Musculoskeletal:  Positive for neck pain. Negative for myalgias.  Skin:  Negative for rash.  Neurological:  Positive for dizziness. Negative for tingling, loss of consciousness and weakness.  Endo/Heme/Allergies:  Negative for polydipsia.     Current Outpatient Medications:    Ascorbic Acid (VITAMIN C) 1000 MG tablet, , Disp: , Rfl:    atenolol (TENORMIN) 25 MG tablet, TAKE 1/2 TABLET(12.5 MG) BY MOUTH DAILY AS NEEDED FOR PALPITATIONS, Disp: 90 tablet, Rfl: 3   calcium-vitamin D 250-100 MG-UNIT per tablet, Take 1 tablet by mouth daily., Disp: , Rfl:    Coenzyme Q10 (CO Q10) 100 MG CAPS, Take by mouth daily., Disp: , Rfl:    Doxylamine Succinate,  Sleep, (SLEEP-AID PO), Take by mouth. [employer] sleep aid, Disp: , Rfl:    estradiol (ESTRACE) 0.5 MG tablet, Take 0.5 mg by mouth daily., Disp: , Rfl:    hydrocortisone (ANUSOL-HC) 2.5 % rectal cream, Place 1 Application rectally 2 (two) times daily. (Patient taking differently: Place 1 Application rectally 2 (two) times daily. As needed), Disp: 30 g, Rfl: 0   L-LYSINE PO, Take 1 tablet by mouth 2 (two) times a week., Disp: , Rfl:    meclizine (ANTIVERT) 12.5 MG tablet, meclizine 12.5 mg tablet  one tablet every 6-12 hours as needed for dizziness, Disp: 30 tablet, Rfl: 1   meloxicam (MOBIC) 15 MG tablet, TAKE 1 TABLET BY MOUTH AS NEEDED, Disp: 30 tablet, Rfl: 1   metFORMIN (GLUCOPHAGE) 500 MG tablet, Take 0.5 tablets (250 mg total) by mouth daily. Take half a tablet every morning with a meal., Disp: 90 tablet, Rfl: 3   Multiple Vitamin (MULTIVITAMIN) tablet, Take 1 tablet by mouth daily., Disp: , Rfl:    omeprazole (PRILOSEC) 20 MG capsule, Take 20 mg by mouth daily., Disp: , Rfl:    Phenyleph-Diphenhyd-Hydrocod (HYDRO-DP PO), Take by mouth. Hydroeye, Disp: , Rfl:    RESTASIS 0.05 % ophthalmic emulsion, , Disp: , Rfl:    rosuvastatin (CRESTOR) 10 MG tablet, Take 1 tablet (10 mg total) by mouth daily., Disp: 90 tablet, Rfl: 3   SAMBUCOL BLACK ELDERBERRY PO, Take 1 capsule  by mouth 2 (two) times a week., Disp: , Rfl:    zolmitriptan (ZOMIG-ZMT) 2.5 MG disintegrating tablet, TAKE 1 TABLET BY MOUTH AS NEEDED FOR MIGRAINE, Disp: 10 tablet, Rfl: 4   Objective:     BP 118/76   Pulse 74   Temp 97.6 F (36.4 C)   Ht 5\' 1"  (1.549 m)   Wt 114 lb 12.8 oz (52.1 kg)   SpO2 97%   BMI 21.69 kg/m    Physical Exam Constitutional:      General: She is not in acute distress.    Appearance: Normal appearance. She is not ill-appearing, toxic-appearing or diaphoretic.  HENT:     Head: Normocephalic and atraumatic.     Right Ear: Tympanic membrane, ear canal and external ear normal.     Left Ear: Tympanic  membrane, ear canal and external ear normal.     Mouth/Throat:     Mouth: Mucous membranes are moist.     Pharynx: Oropharynx is clear. No oropharyngeal exudate or posterior oropharyngeal erythema.  Eyes:     General: No scleral icterus.       Right eye: No discharge.        Left eye: No discharge.     Extraocular Movements: Extraocular movements intact.     Conjunctiva/sclera: Conjunctivae normal.     Pupils: Pupils are equal, round, and reactive to light.  Cardiovascular:     Rate and Rhythm: Normal rate and regular rhythm.  Pulmonary:     Effort: Pulmonary effort is normal. No respiratory distress.     Breath sounds: Normal breath sounds.  Musculoskeletal:     Cervical back: No rigidity or tenderness.     Lumbar back: No tenderness or bony tenderness. Decreased range of motion (There is decreased in flexion and extension). Negative right straight leg raise test and negative left straight leg raise test.     Right hip: No bony tenderness. Decreased range of motion.     Left hip: No bony tenderness. Decreased range of motion.     Comments: Decreased rotation of both hips with the internal rotation left hip more pronounced.  Skin:    General: Skin is warm and dry.  Neurological:     Mental Status: She is alert and oriented to person, place, and time.  Psychiatric:        Mood and Affect: Mood normal.        Behavior: Behavior normal.      No results found for any visits on 11/13/22.    The 10-year ASCVD risk score (Arnett DK, et al., 2019) is: 11.9%    Assessment & Plan:   Labyrinthitis, unspecified laterality  Pain of left hip -     Ambulatory referral to Physical Therapy -     DG HIP UNILAT W OR W/O PELVIS 2-3 VIEWS LEFT; Future -     DG HIP UNILAT W OR W/O PELVIS 2-3 VIEWS RIGHT; Future  Strain of neck muscle, initial encounter -     Ambulatory referral to Physical Therapy    Return if symptoms worsen or fail to improve.  May use ibuprofen for pain and work  with physical therapy.  Use meclizine as needed   Mliss Sax, MD

## 2022-11-16 ENCOUNTER — Ambulatory Visit (INDEPENDENT_AMBULATORY_CARE_PROVIDER_SITE_OTHER): Payer: Medicare PPO

## 2022-11-16 ENCOUNTER — Other Ambulatory Visit: Payer: Self-pay | Admitting: Family Medicine

## 2022-11-16 DIAGNOSIS — M25552 Pain in left hip: Secondary | ICD-10-CM

## 2022-11-16 DIAGNOSIS — H8309 Labyrinthitis, unspecified ear: Secondary | ICD-10-CM

## 2022-11-16 DIAGNOSIS — M25551 Pain in right hip: Secondary | ICD-10-CM | POA: Diagnosis not present

## 2022-11-16 DIAGNOSIS — M160 Bilateral primary osteoarthritis of hip: Secondary | ICD-10-CM | POA: Diagnosis not present

## 2022-11-16 DIAGNOSIS — M47817 Spondylosis without myelopathy or radiculopathy, lumbosacral region: Secondary | ICD-10-CM | POA: Diagnosis not present

## 2022-11-16 DIAGNOSIS — S161XXA Strain of muscle, fascia and tendon at neck level, initial encounter: Secondary | ICD-10-CM

## 2022-11-18 ENCOUNTER — Encounter: Payer: Self-pay | Admitting: Physical Therapy

## 2022-11-18 ENCOUNTER — Ambulatory Visit: Payer: Medicare PPO | Attending: Family Medicine | Admitting: Physical Therapy

## 2022-11-18 DIAGNOSIS — S161XXA Strain of muscle, fascia and tendon at neck level, initial encounter: Secondary | ICD-10-CM | POA: Insufficient documentation

## 2022-11-18 DIAGNOSIS — G43C0 Periodic headache syndromes in child or adult, not intractable: Secondary | ICD-10-CM | POA: Insufficient documentation

## 2022-11-18 DIAGNOSIS — M5459 Other low back pain: Secondary | ICD-10-CM | POA: Diagnosis not present

## 2022-11-18 DIAGNOSIS — M542 Cervicalgia: Secondary | ICD-10-CM | POA: Insufficient documentation

## 2022-11-18 DIAGNOSIS — M6281 Muscle weakness (generalized): Secondary | ICD-10-CM | POA: Diagnosis not present

## 2022-11-18 DIAGNOSIS — M25552 Pain in left hip: Secondary | ICD-10-CM | POA: Insufficient documentation

## 2022-11-18 NOTE — Therapy (Signed)
OUTPATIENT PHYSICAL THERAPY THORACOLUMBAR EVALUATION   Patient Name: [name] MRN: [id number] DOB:[birth year], 72 y.o., female Today's Date: 11/18/2022  END OF SESSION:  PT End of Session - 11/18/22 0758     Visit Number 1    Number of Visits 13    Date for PT Re-Evaluation 02/09/23    Authorization Type [employer]    PT Start Time [phone]    PT Stop Time 0840    PT Time Calculation (min) 43 min    Activity Tolerance Patient tolerated treatment well    Behavior During Therapy WFL for tasks assessed/performed             Past Medical History:  Diagnosis Date   Anal fissure    Anemia    during pregnancy   Anxiety    Basal cell carcinoma    DDD (degenerative disc disease)    Diverticulosis    Hypokalemia    LBP (low back pain)    Melanoma (HCC)    Migraine    MVP (mitral valve prolapse) 1990   psa   Rosacea    Ulcer    as a child   Past Surgical History:  Procedure Laterality Date   ABDOMINAL HYSTERECTOMY  1985   BREAST BIOPSY Left    secondary to infection   I & D EXTREMITY Right 09/09/2012   Procedure: Minor IRRIGATION AND DEBRIDEMENT EXTREMITY paronychia of right thumb;  Surgeon: Nicki Reaper, MD;  Location: [facility];  Service: Orthopedics;  Laterality: Right;   I & D EXTREMITY Left 01/26/2014   Procedure: MINOR IINCISION AND DRAINAGE paronychia left index finger;  Surgeon: Cindee Salt, MD;  Location: [facility];  Service: Orthopedics;  Laterality: Left;  left index   OOPHORECTOMY Left 1994   REPAIR KNEE LIGAMENT Left 1989   TONSILLECTOMY  1955   TUBAL LIGATION     VEIN SURGERY     Patient Active Problem List   Diagnosis Date Noted   Pain of left hip 11/13/2022   Cervical strain 11/13/2022   Sciatica of right side 09/30/2022   B12 deficiency 09/30/2022   Anxiety 09/30/2022   Agatston coronary artery calcium score between 200 and 399 08/03/2022   Osteopenia of necks of both femurs 06/23/2022   Dupuytren's contracture of  both hands 06/23/2022   Low TSH level 05/20/2022   Prediabetes 05/20/2022   Cellulitis of finger of right hand 09/08/2021   Infection of skin 05/13/2021   Cervicalgia 05/06/2021   Grieving 05/06/2021   Conductive hearing loss of right ear 03/04/2021   Impacted cerumen of right ear 03/04/2021   Medication side effect 03/03/2021   Excessive cerumen in right ear canal 03/03/2021   Labyrinthitis 07/30/2020   Palpitations 04/15/2020   Plantar fasciitis 04/15/2020   Dysthymia 04/15/2020   Vitamin D deficiency 04/15/2020   Gastroesophageal reflux disease 04/15/2020   Insect bite of left foot 11/24/2019   Pustule 11/24/2019   Pain of right thumb 03/09/2019   External hemorrhoids 03/03/2018   Other chest pain 02/23/2018   Insomnia 02/24/2017   Healthcare maintenance 02/24/2017   Conjunctivitis 03/01/2014   Seborrheic keratosis, inflamed 01/05/2012   Menopausal syndrome 12/22/2011   Encounter for counseling 11/11/2011   Skin cancer 07/14/2011   Basal cell carcinoma of face 03/10/2011   Migraine 05/14/2007   IRRITABLE BOWEL SYNDROME 05/14/2007   History of mitral valve prolapse 05/14/2007   Allergic rhinitis 04/07/2007   DERMATITIS DUE TO SOLAR RADIATION 04/07/2007   Premature  beat 09/20/2006   DIVERTICULOSIS, COLON 04/13/2002    PCP: Doreene Burke, MD  REFERRING PROVIDER: Doreene Burke, MD  REFERRING DIAG: back, hip and neck pain  Rationale for Evaluation and Treatment: Rehabilitation  THERAPY DIAG:  Other low back pain  Pain in right hip  Cervicalgia  ONSET DATE: 11/13/2022  SUBJECTIVE:                                                                                                                                                                                           SUBJECTIVE STATEMENT: Patient with left hip pain for several months, she reports that it is worse recently, reports pain in the left buttock, to the left lateral thigh and into the left groin.  Had x-rays Monday but  the radiologist has not read it.  She does report that she just recently started playing pickle ball.  Also has neck pain mostly left  PERTINENT HISTORY:  See chart  PAIN:  Are you having pain? Yes: NPRS scale: 4/10 Pain location: left buttock, also neck pain Pain description: sharp, tight, sore Aggravating factors: sometimes just happens, twist pain up to 10/10 Relieving factors: stretching at best pain can be 0/10   PRECAUTIONS: None  RED FLAGS: None   WEIGHT BEARING RESTRICTIONS: No  FALLS:  Has patient fallen in last 6 months? Yes. Number of falls 1 while playing pickle ball  LIVING ENVIRONMENT: Lives with: lives with their family and lives with their spouse Lives in: House/apartment Stairs: No Has following equipment at home: None  OCCUPATION: retired  PLOF: Independent and walks, pickle ball, travels  PATIENT GOALS: have less pain, move better, be more flexible  NEXT MD VISIT: none  OBJECTIVE:  Note: Objective measures were completed at Evaluation unless otherwise noted.  DIAGNOSTIC FINDINGS:  Recent x-rays but not read by the radiologist  PATIENT SURVEYS:  FOTO 49  COGNITION: Overall cognitive status: Within functional limits for tasks assessed     SENSATION: WFL  MUSCLE LENGTH: SLR 40 degrees pain  bilateral, very tight in the piriformis, extremely tight in the calves and the adductors,   POSTURE:  good sitting posture  PALPATION: Very tender in the left buttock, mild tenderness in the left low back , significant tenderness in the neck especially the left  LUMBAR ROM:   AROM eval  Flexion Decreased 50%  Extension Decreased 50%  Right lateral flexion 50%  Left lateral flexion 50%  Right rotation   Left rotation    (Blank rows = not tested)  CERVICAL ROM:  decreased 25% with pain at end range   LOWER EXTREMITY ROM:     Active  Right eval Left eval  Hip flexion    Hip extension    Hip abduction    Hip adduction    Hip internal  rotation    Hip external rotation    Knee flexion    Knee extension    Ankle dorsiflexion    Ankle plantarflexion    Ankle inversion    Ankle eversion     (Blank rows = not tested)  LOWER EXTREMITY MMT:    MMT Right eval Left eval  Hip flexion  3+  Hip extension  4  Hip abduction  3+  Hip adduction    Hip internal rotation    Hip external rotation    Knee flexion    Knee extension    Ankle dorsiflexion    Ankle plantarflexion    Ankle inversion    Ankle eversion     (Blank rows = not tested)  LUMBAR SPECIAL TESTS:  Straight leg raise test: Positive, Slump test: Positive, and Thomas test: Positive  GAIT: Distance walked: 60 feet Assistive device utilized: None Level of assistance: Complete Independence Comments: mild antalgic gait on the right, reports that at times if she does a pivot she will have very sharp pain  TODAY'S TREATMENT:                                                                                                                              DATE:     PATIENT EDUCATION:  Education details: POC/HEP Person educated: Patient Education method: Programmer, multimedia, Facilities manager, and Verbal cues Education comprehension: verbalized understanding  HOME EXERCISE PROGRAM: 11/18/22:  HS, calf, piriformis and adductor stretch  ASSESSMENT:  CLINICAL IMPRESSION: Patient is a 72 y.o. female who was seen today for physical therapy evaluation and treatment for back hip and neck pain.  She started playing pickleball recently.  She is extremely tight in the HS, calves, piriformis, adductors and quad/hip flexors. Very tender in the buttocks. Very tight and tender in the neck and upper traps.   OBJECTIVE IMPAIRMENTS: Abnormal gait, cardiopulmonary status limiting activity, decreased activity tolerance, decreased balance, decreased endurance, decreased mobility, difficulty walking, decreased ROM, decreased strength, dizziness, increased muscle spasms, impaired flexibility,  improper body mechanics, postural dysfunction, and pain.   REHAB POTENTIAL: Good  CLINICAL DECISION MAKING: Stable/uncomplicated  EVALUATION COMPLEXITY: Low   GOALS: Goals reviewed with patient? Yes  SHORT TERM GOALS: Target date: 12/02/22  Independent with initial HEP Goal status: INITIAL  LONG TERM GOALS: Target date: 02/09/23  Independent with advanced HEP Goal status: INITIAL  2.  Decrease pain overall with ADL's 50% Goal status: INITIAL  3.  Increase lumbar ROM 25% Goal status: INITIAL  4.  Increase SLR to 65 degrees Goal status: INITIAL  5.  Walk 2 miles without an increase of pain Goal status: INITIAL  6.  Increase cervical ROM 25% Goal status: INITIAL  PLAN:  PT FREQUENCY: 1-2x/week  PT DURATION: 10 weeks  PLANNED INTERVENTIONS: Therapeutic exercises,  Therapeutic activity, Neuromuscular re-education, Balance training, Gait training, Patient/Family education, Self Care, Joint mobilization, Joint manipulation, Stair training, Dry Needling, Electrical stimulation, Spinal mobilization, Cryotherapy, Moist heat, Taping, Traction, Ultrasound, Ionotophoresis 4mg /ml Dexamethasone, and Manual therapy.  PLAN FOR NEXT SESSION: work on flexibility, STM if she tolerates, she gets dizzy when lying down   [employer], PT 11/18/2022, 7:59 AM

## 2022-11-19 DIAGNOSIS — L570 Actinic keratosis: Secondary | ICD-10-CM | POA: Diagnosis not present

## 2022-11-19 DIAGNOSIS — L603 Nail dystrophy: Secondary | ICD-10-CM | POA: Diagnosis not present

## 2022-11-19 DIAGNOSIS — L821 Other seborrheic keratosis: Secondary | ICD-10-CM | POA: Diagnosis not present

## 2022-11-19 DIAGNOSIS — L819 Disorder of pigmentation, unspecified: Secondary | ICD-10-CM | POA: Diagnosis not present

## 2022-11-19 DIAGNOSIS — Z85828 Personal history of other malignant neoplasm of skin: Secondary | ICD-10-CM | POA: Diagnosis not present

## 2022-11-23 NOTE — Addendum Note (Signed)
Addended by: Jearld Lesch on: 11/23/2022 10:45 AM   Modules accepted: Orders

## 2022-12-07 ENCOUNTER — Ambulatory Visit: Payer: Medicare PPO | Admitting: Physical Therapy

## 2022-12-07 ENCOUNTER — Ambulatory Visit (INDEPENDENT_AMBULATORY_CARE_PROVIDER_SITE_OTHER): Payer: Medicare PPO

## 2022-12-07 ENCOUNTER — Encounter: Payer: Self-pay | Admitting: Physical Therapy

## 2022-12-07 DIAGNOSIS — G43C0 Periodic headache syndromes in child or adult, not intractable: Secondary | ICD-10-CM | POA: Diagnosis not present

## 2022-12-07 DIAGNOSIS — M5459 Other low back pain: Secondary | ICD-10-CM

## 2022-12-07 DIAGNOSIS — Z0000 Encounter for general adult medical examination without abnormal findings: Secondary | ICD-10-CM | POA: Diagnosis not present

## 2022-12-07 DIAGNOSIS — M6281 Muscle weakness (generalized): Secondary | ICD-10-CM | POA: Diagnosis not present

## 2022-12-07 DIAGNOSIS — M25552 Pain in left hip: Secondary | ICD-10-CM | POA: Diagnosis not present

## 2022-12-07 DIAGNOSIS — M542 Cervicalgia: Secondary | ICD-10-CM

## 2022-12-07 DIAGNOSIS — S161XXA Strain of muscle, fascia and tendon at neck level, initial encounter: Secondary | ICD-10-CM | POA: Diagnosis not present

## 2022-12-07 MED ORDER — ZOLMITRIPTAN 2.5 MG PO TBDP: ORAL_TABLET | ORAL | 4 refills | Status: DC

## 2022-12-07 NOTE — Progress Notes (Signed)
Subjective:   [name] is a 72 y.o. female who presents for Medicare Annual (Subsequent) preventive examination.  Visit Complete: Virtual I connected with  Burnett Kanaris on 12/07/22 by a audio enabled telemedicine application and verified that I am speaking with the correct person using two identifiers.  Patient Location: Home  Provider Location: Office/Clinic  I discussed the limitations of evaluation and management by telemedicine. The patient expressed understanding and agreed to proceed.  Vital Signs: Because this visit was a virtual/telehealth visit, some criteria may be missing or patient reported. Any vitals not documented were not able to be obtained and vitals that have been documented are patient reported.    Cardiac Risk Factors include: advanced age (>62men, >14 women)     Objective:    Today's Vitals   There is no height or weight on file to calculate BMI.     12/07/2022    1:48 PM 12/16/2021    3:01 PM 10/08/2020    1:00 PM 11/14/2019    9:48 AM 10/05/2019   11:28 AM 02/22/2018    1:37 PM 11/18/2015    2:37 PM  Advanced Directives  Does Patient Have a Medical Advance Directive? Yes Yes Yes No Yes No No  Type of Estate agent of [place];Living will Healthcare Power of [place];Living will Healthcare Power of [employer] of [place];Living will    Copy of Healthcare Power of Attorney in Chart? No - copy requested No - copy requested No - copy requested  No - copy requested    Would patient like information on creating a medical advance directive?    No - Patient declined       Current Medications (verified) Outpatient Encounter Medications as of 12/07/2022  Medication Sig   Ascorbic Acid (VITAMIN C) 1000 MG tablet    atenolol (TENORMIN) 25 MG tablet TAKE 1/2 TABLET(12.5 MG) BY MOUTH DAILY AS NEEDED FOR PALPITATIONS   calcium-vitamin D 250-100 MG-UNIT per tablet Take 1 tablet by mouth daily.   Coenzyme Q10 (CO Q10)  100 MG CAPS Take by mouth daily.   Doxylamine Succinate, Sleep, (SLEEP-AID PO) Take by mouth. [facility] sleep aid   estradiol (ESTRACE) 0.5 MG tablet Take 0.5 mg by mouth daily.   hydrocortisone (ANUSOL-HC) 2.5 % rectal cream Place 1 Application rectally 2 (two) times daily. (Patient taking differently: Place 1 Application rectally 2 (two) times daily. As needed)   L-LYSINE PO Take 1 tablet by mouth 2 (two) times a week.   meclizine (ANTIVERT) 12.5 MG tablet meclizine 12.5 mg tablet  one tablet every 6-12 hours as needed for dizziness   meloxicam (MOBIC) 15 MG tablet TAKE 1 TABLET BY MOUTH AS NEEDED   metFORMIN (GLUCOPHAGE) 500 MG tablet Take 0.5 tablets (250 mg total) by mouth daily. Take half a tablet every morning with a meal.   Multiple Vitamin (MULTIVITAMIN) tablet Take 1 tablet by mouth daily.   omeprazole (PRILOSEC) 20 MG capsule Take 20 mg by mouth daily.   Phenyleph-Diphenhyd-Hydrocod (HYDRO-DP PO) Take by mouth. Hydroeye   RESTASIS 0.05 % ophthalmic emulsion    zolmitriptan (ZOMIG-ZMT) 2.5 MG disintegrating tablet TAKE 1 TABLET BY MOUTH AS NEEDED FOR MIGRAINE   rosuvastatin (CRESTOR) 10 MG tablet Take 1 tablet (10 mg total) by mouth daily.   SAMBUCOL BLACK ELDERBERRY PO Take 1 capsule by mouth 2 (two) times a week. (Patient not taking: Reported on 12/07/2022)   No facility-administered encounter medications on file as of 12/07/2022.  Allergies (verified) Codeine, Compazine, Gabapentin, Ondansetron hcl, Other, Prednisone, Prochlorperazine edisylate, Sulfa antibiotics, and Sulfonamide derivatives   History: Past Medical History:  Diagnosis Date   Anal fissure    Anemia    during pregnancy   Anxiety    Basal cell carcinoma    DDD (degenerative disc disease)    Diverticulosis    Hypokalemia    LBP (low back pain)    Melanoma (HCC)    Migraine    MVP (mitral valve prolapse) 1990   psa   Rosacea    Ulcer    as a child   Past Surgical History:  Procedure Laterality Date    ABDOMINAL HYSTERECTOMY  1985   BREAST BIOPSY Left    secondary to infection   I & D EXTREMITY Right 09/09/2012   Procedure: Minor IRRIGATION AND DEBRIDEMENT EXTREMITY paronychia of right thumb;  Surgeon: Nicki Reaper, MD;  Location: [facility];  Service: Orthopedics;  Laterality: Right;   I & D EXTREMITY Left 01/26/2014   Procedure: MINOR IINCISION AND DRAINAGE paronychia left index finger;  Surgeon: Cindee Salt, MD;  Location: [facility];  Service: Orthopedics;  Laterality: Left;  left index   OOPHORECTOMY Left 1994   REPAIR KNEE LIGAMENT Left 1989   TONSILLECTOMY  1955   TUBAL LIGATION     VEIN SURGERY     Family History  Problem Relation Age of Onset   Heart disease Mother    Colon polyps Mother    Heart disease Father    Colitis Father    Colon polyps Father    Esophageal cancer Maternal Aunt    Colon cancer Neg Hx    Social History   Socioeconomic History   Marital status: Married    Spouse name: Not on file   Number of children: 2   Years of education: Not on file   Highest education level: Not on file  Occupational History   Occupation: retired school system  Tobacco Use   Smoking status: Never   Smokeless tobacco: Never  Vaping Use   Vaping status: Never Used  Substance and Sexual Activity   Alcohol use: Yes    Alcohol/week: 1.0 standard drink of alcohol    Types: 1 Standard drinks or equivalent per week    Comment: social wine   Drug use: No   Sexual activity: Yes  Other Topics Concern   Not on file  Social History Narrative   Not on file   Social Determinants of Health   Financial Resource Strain: Low Risk  (12/07/2022)   Overall Financial Resource Strain (CARDIA)    Difficulty of Paying Living Expenses: Not hard at all  Food Insecurity: No Food Insecurity (12/07/2022)   Hunger Vital Sign    Worried About Running Out of Food in the Last Year: Never true    Ran Out of Food in the Last Year: Never true  Transportation  Needs: No Transportation Needs (12/07/2022)   PRAPARE - Administrator, Civil Service (Medical): No    Lack of Transportation (Non-Medical): No  Physical Activity: Sufficiently Active (12/07/2022)   Exercise Vital Sign    Days of Exercise per Week: 3 days    Minutes of Exercise per Session: 120 min  Stress: No Stress Concern Present (12/07/2022)   [employer] of Occupational Health - Occupational Stress Questionnaire    Feeling of Stress : Not at all  Social Connections: Moderately Integrated (12/07/2022)   Social Connection and Isolation  Panel [NHANES]    Frequency of Communication with Friends and Family: More than three times a week    Frequency of Social Gatherings with Friends and Family: Once a week    Attends Religious Services: More than 4 times per year    Active Member of [employer] or Organizations: No    Attends Engineer, structural: Never    Marital Status: Married    Tobacco Counseling Counseling given: Not Answered   Clinical Intake:  Pre-visit preparation completed: Yes  Pain : No/denies pain     Nutritional Risks: None Diabetes: No  How often do you need to have someone help you when you read instructions, pamphlets, or other written materials from your doctor or pharmacy?: 1 - Never  Interpreter Needed?: No  Information entered by :: NAllen LPN   Activities of Daily Living    12/07/2022    1:40 PM 12/16/2021    3:02 PM  In your present state of health, do you have any difficulty performing the following activities:  Hearing? 0 0  Comment  ear blocked up  Vision? 0 0  Difficulty concentrating or making decisions? 0 0  Walking or climbing stairs? 0 0  Dressing or bathing? 0 0  Doing errands, shopping? 0 0  Preparing Food and eating ? N N  Using the Toilet? N N  In the past six months, have you accidently leaked urine? N Y  Comment  with hard sneeze  Do you have problems with loss of bowel control? N N  Managing your  Medications? N N  Managing your Finances? N N  Housekeeping or managing your Housekeeping? N N    Patient Care Team: Mliss Sax, MD as PCP - General (Family Medicine) Croitoru, Rachelle Hora, MD as PCP - Cardiology (Cardiology)  Indicate any recent Medical Services you may have received from other than [facility] providers in the past year (date may be approximate).     Assessment:   This is a routine wellness examination for [place].  Hearing/Vision screen Hearing Screening - Comments:: Denies hearing issues Vision Screening - Comments:: [facility]   Goals Addressed             This Visit's Progress    Patient Stated       12/07/2022, live life to the fullest       Depression Screen    12/07/2022    1:49 PM 08/27/2022    2:48 PM 08/03/2022    1:36 PM 06/23/2022    1:42 PM 05/20/2022    9:20 AM 05/20/2022    8:13 AM 12/16/2021    3:02 PM  PHQ 2/9 Scores  PHQ - 2 Score 0 0 0 0 0 0 0  PHQ- 9 Score 0    0      Fall Risk    12/07/2022    1:48 PM 08/27/2022    2:47 PM 08/03/2022    1:36 PM 06/23/2022    1:42 PM 05/20/2022    8:13 AM  Fall Risk   Falls in the past year? 0 0 0 0 0  Number falls in past yr: 0 0 0 0 0  Injury with Fall? 0 0 0 0 0  Risk for fall due to : Medication side effect No Fall Risks No Fall Risks No Fall Risks No Fall Risks  Follow up Falls prevention discussed;Falls evaluation completed Falls prevention discussed Falls prevention discussed Falls evaluation completed Falls evaluation completed    MEDICARE RISK AT  HOME: Medicare Risk at Home Any stairs in or around the home?: Yes If so, are there any without handrails?: No Home free of loose throw rugs in walkways, pet beds, electrical cords, etc?: Yes Adequate lighting in your home to reduce risk of falls?: Yes Life alert?: No Use of a cane, walker or w/c?: No Grab bars in the bathroom?: Yes Shower chair or bench in shower?: No Elevated toilet seat or a handicapped toilet?: Yes  TIMED UP  AND GO:  Was the test performed?  No    Cognitive Function:        12/07/2022    1:49 PM 12/16/2021    3:03 PM  6CIT Screen  What Year? 0 points 0 points  What month? 0 points 0 points  What time? 0 points 0 points  Count back from 20 0 points 0 points  Months in reverse 0 points 0 points  Repeat phrase 0 points 0 points  Total Score 0 points 0 points    Immunizations Immunization History  Administered Date(s) Administered   Fluad Quad(high Dose 65+) 12/10/2020, 11/16/2022   Influenza Inj Mdck Quad Pf 11/23/2012, 11/22/2013   Influenza Split 12/31/2010, 11/23/2012   Influenza Whole 11/11/2006, 11/14/2007, 11/30/2008, 11/26/2021   Influenza, High Dose Seasonal PF 12/06/2014, 11/18/2015, 11/16/2016, 11/24/2018   Influenza,inj,Quad PF,6+ Mos 11/22/2017   Influenza-Unspecified 11/23/2012, 11/22/2013, 11/22/2017, 11/24/2018, 01/23/2020   PFIZER(Purple Top)SARS-COV-2 Vaccination 03/03/2019, 03/24/2019, 03/14/2020   Pneumococcal Conjugate-13 12/28/2013   Pneumococcal Polysaccharide-23 01/08/2015   Td 02/09/2001   Tdap 12/22/2011   Zoster, Live 07/14/2011    TDAP status: Up to date  Flu Vaccine status: Up to date  Pneumococcal vaccine status: Up to date  Covid-19 vaccine status: Information provided on how to obtain vaccines.   Qualifies for Shingles Vaccine? Yes   Zostavax completed Yes   Shingrix Completed?: No.    Education has been provided regarding the importance of this vaccine. Patient has been advised to call insurance company to determine out of pocket expense if they have not yet received this vaccine. Advised may also receive vaccine at local pharmacy or Health Dept. Verbalized acceptance and understanding.  Screening Tests Health Maintenance  Topic Date Due   Zoster Vaccines- Shingrix (1 of 2) 06/22/2023 (Originally 09/16/1969)   Medicare Annual Wellness (AWV)  12/07/2023   MAMMOGRAM  05/20/2024   Colonoscopy  09/26/2024   Pneumonia Vaccine 44+ Years old   Completed   INFLUENZA VACCINE  Completed   DEXA SCAN  Completed   Hepatitis C Screening  Completed   HPV VACCINES  Aged Out   DTaP/Tdap/Td  Discontinued   COVID-19 Vaccine  Discontinued    Health Maintenance  There are no preventive care reminders to display for this patient.   Colorectal cancer screening: Type of screening: Colonoscopy. Completed 09/27/2019. Repeat every 5 years  Mammogram status: Completed 05/21/2022. Repeat every year  Bone Density status: Completed 12/19/2019.   Lung Cancer Screening: (Low Dose CT Chest recommended if Age 11-80 years, 20 pack-year currently smoking OR have quit w/in 15years.) does not qualify.   Lung Cancer Screening Referral: no  Additional Screening:  Hepatitis C Screening: does qualify; Completed 12/31/2014  Vision Screening: Recommended annual ophthalmology exams for early detection of glaucoma and other disorders of the eye. Is the patient up to date with their annual eye exam?  Yes  Who is the provider or what is the name of the office in which the patient attends annual eye exams? [facility] If pt is  not established with a provider, would they like to be referred to a provider to establish care? No .   Dental Screening: Recommended annual dental exams for proper oral hygiene  Diabetic Foot Exam: n/a  Community Resource Referral / Chronic Care Management: CRR required this visit?  No   CCM required this visit?  No     Plan:     I have personally reviewed and noted the following in the patient's chart:   Medical and social history Use of alcohol, tobacco or illicit drugs  Current medications and supplements including opioid prescriptions. Patient is not currently taking opioid prescriptions. Functional ability and status Nutritional status Physical activity Advanced directives List of other physicians Hospitalizations, surgeries, and ER visits in previous 12 months Vitals Screenings to include cognitive,  depression, and falls Referrals and appointments  In addition, I have reviewed and discussed with patient certain preventive protocols, quality metrics, and best practice recommendations. A written personalized care plan for preventive services as well as general preventive health recommendations were provided to patient.     Barb Merino, LPN   16/11/9602   After Visit Summary: (MyChart) Due to this being a telephonic visit, the after visit summary with patients personalized plan was offered to patient via MyChart   Nurse Notes: none

## 2022-12-07 NOTE — Therapy (Signed)
OUTPATIENT PHYSICAL THERAPY THORACOLUMBAR TREATMENT   Patient Name: [name] MRN: [id number] DOB:[birth year], 72 y.o., female Today's Date: 12/07/2022  END OF SESSION:  PT End of Session - 12/07/22 1527     Visit Number 2    Number of Visits 13    Date for PT Re-Evaluation 02/09/23    Authorization Type [employer]    PT Start Time 1527    PT Stop Time 1612    PT Time Calculation (min) 45 min    Activity Tolerance Patient tolerated treatment well    Behavior During Therapy WFL for tasks assessed/performed             Past Medical History:  Diagnosis Date   Anal fissure    Anemia    during pregnancy   Anxiety    Basal cell carcinoma    DDD (degenerative disc disease)    Diverticulosis    Hypokalemia    LBP (low back pain)    Melanoma (HCC)    Migraine    MVP (mitral valve prolapse) 1990   psa   Rosacea    Ulcer    as a child   Past Surgical History:  Procedure Laterality Date   ABDOMINAL HYSTERECTOMY  1985   BREAST BIOPSY Left    secondary to infection   I & D EXTREMITY Right 09/09/2012   Procedure: Minor IRRIGATION AND DEBRIDEMENT EXTREMITY paronychia of right thumb;  Surgeon: Nicki Reaper, MD;  [facility];  Service: Orthopedics;  Laterality: Right;   I & D EXTREMITY Left 01/26/2014   Procedure: MINOR IINCISION AND DRAINAGE paronychia left index finger;  Surgeon: Cindee Salt, MD;  Location:  [facility];  Service: Orthopedics;  Laterality: Left;  left index   OOPHORECTOMY Left 1994   REPAIR KNEE LIGAMENT Left 1989   TONSILLECTOMY  1955   TUBAL LIGATION     VEIN SURGERY     Patient Active Problem List   Diagnosis Date Noted   Pain of left hip 11/13/2022   Cervical strain 11/13/2022   Sciatica of right side 09/30/2022   B12 deficiency 09/30/2022   Anxiety 09/30/2022   Agatston coronary artery calcium score between 200 and 399 08/03/2022   Osteopenia of necks of both femurs 06/23/2022   Dupuytren's contracture of  both hands 06/23/2022   Low TSH level 05/20/2022   Prediabetes 05/20/2022   Cellulitis of finger of right hand 09/08/2021   Infection of skin 05/13/2021   Cervicalgia 05/06/2021   Grieving 05/06/2021   Conductive hearing loss of right ear 03/04/2021   Impacted cerumen of right ear 03/04/2021   Medication side effect 03/03/2021   Excessive cerumen in right ear canal 03/03/2021   Labyrinthitis 07/30/2020   Palpitations 04/15/2020   Plantar fasciitis 04/15/2020   Dysthymia 04/15/2020   Vitamin D deficiency 04/15/2020   Gastroesophageal reflux disease 04/15/2020   Insect bite of left foot 11/24/2019   Pustule 11/24/2019   Pain of right thumb 03/09/2019   External hemorrhoids 03/03/2018   Other chest pain 02/23/2018   Insomnia 02/24/2017   Healthcare maintenance 02/24/2017   Conjunctivitis 03/01/2014   Seborrheic keratosis, inflamed 01/05/2012   Menopausal syndrome 12/22/2011   Encounter for counseling 11/11/2011   Skin cancer 07/14/2011   Basal cell carcinoma of face 03/10/2011   Migraine 05/14/2007   IRRITABLE BOWEL SYNDROME 05/14/2007   History of mitral valve prolapse 05/14/2007   Allergic rhinitis 04/07/2007   DERMATITIS DUE TO SOLAR RADIATION 04/07/2007   Premature  beat 09/20/2006   DIVERTICULOSIS, COLON 04/13/2002    PCP: Doreene Burke, MD  REFERRING PROVIDER: Doreene Burke, MD  REFERRING DIAG: back, hip and neck pain  Rationale for Evaluation and Treatment: Rehabilitation  THERAPY DIAG:  Other low back pain  Cervicalgia  Pain in left hip  Muscle weakness (generalized)  ONSET DATE: 11/13/2022  SUBJECTIVE:                                                                                                                                                                                           SUBJECTIVE STATEMENT: Patient with left hip pain for several months, she reports that it is worse recently, reports pain in the left buttock, to the left lateral thigh and into the  left groin.  X-rays show mild DJD, reports that she has been doing great but played pickleball and walked today and then her left SI area hurt  PERTINENT HISTORY:  See chart  PAIN:  Are you having pain? Yes: NPRS scale: 4/10 Pain location: left buttock, also neck pain Pain description: sharp, tight, sore Aggravating factors: sometimes just happens, twist pain up to 10/10 Relieving factors: stretching at best pain can be 0/10   PRECAUTIONS: None  RED FLAGS: None   WEIGHT BEARING RESTRICTIONS: No  FALLS:  Has patient fallen in last 6 months? Yes. Number of falls 1 while playing pickle ball  LIVING ENVIRONMENT: Lives with: lives with their family and lives with their spouse Lives in: House/apartment Stairs: No Has following equipment at home: None  OCCUPATION: retired  PLOF: Independent and walks, pickle ball, travels  PATIENT GOALS: have less pain, move better, be more flexible  NEXT MD VISIT: none  OBJECTIVE:  Note: Objective measures were completed at Evaluation unless otherwise noted.  DIAGNOSTIC FINDINGS:  Recent x-rays but not read by the radiologist  PATIENT SURVEYS:  FOTO [id number]  COGNITION: Overall cognitive status: Within functional limits for tasks assessed     SENSATION: WFL  MUSCLE LENGTH: SLR 40 degrees pain  bilateral, very tight in the piriformis, extremely tight in the calves and the adductors,   POSTURE:  good sitting posture  PALPATION: Very tender in the left buttock, mild tenderness in the left low back , significant tenderness in the neck especially the left  LUMBAR ROM:   AROM eval  Flexion Decreased 50%  Extension Decreased 50%  Right lateral flexion 50%  Left lateral flexion 50%  Right rotation   Left rotation    (Blank rows = not tested)  CERVICAL ROM:  decreased 25% with pain at end range   LOWER EXTREMITY ROM:     Active  Right  eval Left eval  Hip flexion    Hip extension    Hip abduction    Hip adduction    Hip  internal rotation    Hip external rotation    Knee flexion    Knee extension    Ankle dorsiflexion    Ankle plantarflexion    Ankle inversion    Ankle eversion     (Blank rows = not tested)  LOWER EXTREMITY MMT:    MMT Right eval Left eval  Hip flexion  3+  Hip extension  4  Hip abduction  3+  Hip adduction    Hip internal rotation    Hip external rotation    Knee flexion    Knee extension    Ankle dorsiflexion    Ankle plantarflexion    Ankle inversion    Ankle eversion     (Blank rows = not tested)  LUMBAR SPECIAL TESTS:  Straight leg raise test: Positive, Slump test: Positive, and Thomas test: Positive  GAIT: Distance walked: 60 feet Assistive device utilized: None Level of assistance: Complete Independence Comments: mild antalgic gait on the right, reports that at times if she does a pivot she will have very sharp pain  TODAY'S TREATMENT:                                                                                                                              DATE:   12/07/22 Nustep level 5 x 5 minutes 2.5# marches, hip abduction and extension Leg press 20# 2x10 Passive HS and piriformis stretches Green tband clamshells Ball b/n knees squeeze Feet on ball bridge Isometric abs with ball push STM to the left upper trap and neck and the left buttock  PATIENT EDUCATION:  Education details: POC/HEP Person educated: Patient Education method: Programmer, multimedia, Facilities manager, and Verbal cues Education comprehension: verbalized understanding  HOME EXERCISE PROGRAM: 11/18/22:  HS, calf, piriformis and adductor stretch  ASSESSMENT:  CLINICAL IMPRESSION: Patient is a 72 y.o. female who was seen today for physical therapy evaluation and treatment for back hip and neck pain. Reports that she has been doing the stretches and feels like she is moving better, still tight, but does appear to be improved, has been doing well until after pickleball and walking today now  with pain in the left buttock and the left cervical area.  Still very tender in these areas  OBJECTIVE IMPAIRMENTS: Abnormal gait, cardiopulmonary status limiting activity, decreased activity tolerance, decreased balance, decreased endurance, decreased mobility, difficulty walking, decreased ROM, decreased strength, dizziness, increased muscle spasms, impaired flexibility, improper body mechanics, postural dysfunction, and pain.   REHAB POTENTIAL: Good  CLINICAL DECISION MAKING: Stable/uncomplicated  EVALUATION COMPLEXITY: Low   GOALS: Goals reviewed with patient? Yes  SHORT TERM GOALS: Target date: 12/02/22  Independent with initial HEP Goal status: met 12/07/22  LONG TERM GOALS: Target date: 02/09/23  Independent with advanced HEP Goal status: INITIAL  2.  Decrease pain overall with ADL's  50% Goal status: INITIAL  3.  Increase lumbar ROM 25% Goal status: INITIAL  4.  Increase SLR to 65 degrees Goal status: INITIAL  5.  Walk 2 miles without an increase of pain Goal status: INITIAL  6.  Increase cervical ROM 25% Goal status: INITIAL  PLAN:  PT FREQUENCY: 1-2x/week  PT DURATION: 10 weeks  PLANNED INTERVENTIONS: Therapeutic exercises, Therapeutic activity, Neuromuscular re-education, Balance training, Gait training, Patient/Family education, Self Care, Joint mobilization, Joint manipulation, Stair training, Dry Needling, Electrical stimulation, Spinal mobilization, Cryotherapy, Moist heat, Taping, Traction, Ultrasound, Ionotophoresis 4mg /ml Dexamethasone, and Manual therapy.  PLAN FOR NEXT SESSION: work on flexibility, STM if she tolerates, she gets dizzy when lying down   [employer], PT 12/07/2022, 3:30 PM

## 2022-12-07 NOTE — Addendum Note (Signed)
Addended by: Andrez Grime on: 12/07/2022 04:31 PM   Modules accepted: Orders

## 2022-12-07 NOTE — Patient Instructions (Signed)
Ms. Perrin , Thank you for taking time to come for your Medicare Wellness Visit. I appreciate your ongoing commitment to your health goals. Please review the following plan we discussed and let me know if I can assist you in the future.   Referrals/Orders/Follow-Ups/Clinician Recommendations: none  This is a list of the screening recommended for you and due dates:  Health Maintenance  Topic Date Due   Zoster (Shingles) Vaccine (1 of 2) 06/22/2023*   Medicare Annual Wellness Visit  12/07/2023   Mammogram  05/20/2024   Colon Cancer Screening  09/26/2024   Pneumonia Vaccine  Completed   Flu Shot  Completed   DEXA scan (bone density measurement)  Completed   Hepatitis C Screening  Completed   HPV Vaccine  Aged Out   DTaP/Tdap/Td vaccine  Discontinued   COVID-19 Vaccine  Discontinued  *Topic was postponed. The date shown is not the original due date.    Advanced directives: (Copy Requested) Please bring a copy of your health care power of attorney and living will to the office to be added to your chart at your convenience.  Next Medicare Annual Wellness Visit scheduled for next year: Yes  Insert Preventive Care attachment Insert FALL PREVENTION attachment if needed

## 2022-12-09 DIAGNOSIS — D485 Neoplasm of uncertain behavior of skin: Secondary | ICD-10-CM | POA: Diagnosis not present

## 2022-12-09 DIAGNOSIS — Z85828 Personal history of other malignant neoplasm of skin: Secondary | ICD-10-CM | POA: Diagnosis not present

## 2022-12-09 DIAGNOSIS — B079 Viral wart, unspecified: Secondary | ICD-10-CM | POA: Diagnosis not present

## 2022-12-17 ENCOUNTER — Ambulatory Visit: Payer: Medicare PPO | Attending: Family Medicine | Admitting: Physical Therapy

## 2022-12-17 ENCOUNTER — Encounter: Payer: Self-pay | Admitting: Physical Therapy

## 2022-12-17 DIAGNOSIS — M25552 Pain in left hip: Secondary | ICD-10-CM | POA: Insufficient documentation

## 2022-12-17 DIAGNOSIS — M542 Cervicalgia: Secondary | ICD-10-CM | POA: Diagnosis not present

## 2022-12-17 DIAGNOSIS — M6281 Muscle weakness (generalized): Secondary | ICD-10-CM | POA: Diagnosis not present

## 2022-12-17 DIAGNOSIS — M5459 Other low back pain: Secondary | ICD-10-CM | POA: Diagnosis not present

## 2022-12-17 NOTE — Therapy (Signed)
OUTPATIENT PHYSICAL THERAPY THORACOLUMBAR TREATMENT   Patient Name: [name] MRN: [id number] DOB:1950/08/29, 72 y.o., female Today's Date: 12/17/2022  END OF SESSION:  PT End of Session - 12/17/22 1614     Visit Number 3    Number of Visits 13    Date for PT Re-Evaluation 02/09/23    Authorization Type [employer]    PT Start Time 1614    PT Stop Time 1700    PT Time Calculation (min) 46 min    Activity Tolerance Patient tolerated treatment well    Behavior During Therapy WFL for tasks assessed/performed             Past Medical History:  Diagnosis Date   Anal fissure    Anemia    during pregnancy   Anxiety    Basal cell carcinoma    DDD (degenerative disc disease)    Diverticulosis    Hypokalemia    LBP (low back pain)    Melanoma (HCC)    Migraine    MVP (mitral valve prolapse) 1990   psa   Rosacea    Ulcer    as a child   Past Surgical History:  Procedure Laterality Date   ABDOMINAL HYSTERECTOMY  1985   BREAST BIOPSY Left    secondary to infection   I & D EXTREMITY Right 09/09/2012   Procedure: Minor IRRIGATION AND DEBRIDEMENT EXTREMITY paronychia of right thumb;  Surgeon: Nicki Reaper, MD;  Location: [facility];  Service: Orthopedics;  Laterality: Right;   I & D EXTREMITY Left 01/26/2014   Procedure: MINOR IINCISION AND DRAINAGE paronychia left index finger;  Surgeon: Cindee Salt, MD;  Location: [facility];  Service: Orthopedics;  Laterality: Left;  left index   OOPHORECTOMY Left 1994   REPAIR KNEE LIGAMENT Left 1989   TONSILLECTOMY  1955   TUBAL LIGATION     VEIN SURGERY     Patient Active Problem List   Diagnosis Date Noted   Pain of left hip 11/13/2022   Cervical strain 11/13/2022   Sciatica of right side 09/30/2022   B12 deficiency 09/30/2022   Anxiety 09/30/2022   Agatston coronary artery calcium score between 200 and 399 08/03/2022   Osteopenia of necks of both femurs 06/23/2022   Dupuytren's contracture of  both hands 06/23/2022   Low TSH level 05/20/2022   Prediabetes 05/20/2022   Cellulitis of finger of right hand 09/08/2021   Infection of skin 05/13/2021   Cervicalgia 05/06/2021   Grieving 05/06/2021   Conductive hearing loss of right ear 03/04/2021   Impacted cerumen of right ear 03/04/2021   Medication side effect 03/03/2021   Excessive cerumen in right ear canal 03/03/2021   Labyrinthitis 07/30/2020   Palpitations 04/15/2020   Plantar fasciitis 04/15/2020   Dysthymia 04/15/2020   Vitamin D deficiency 04/15/2020   Gastroesophageal reflux disease 04/15/2020   Insect bite of left foot 11/24/2019   Pustule 11/24/2019   Pain of right thumb 03/09/2019   External hemorrhoids 03/03/2018   Other chest pain 02/23/2018   Insomnia 02/24/2017   Healthcare maintenance 02/24/2017   Conjunctivitis 03/01/2014   Seborrheic keratosis, inflamed 01/05/2012   Menopausal syndrome 12/22/2011   Encounter for counseling 11/11/2011   Skin cancer 07/14/2011   Basal cell carcinoma of face 03/10/2011   Migraine 05/14/2007   IRRITABLE BOWEL SYNDROME 05/14/2007   History of mitral valve prolapse 05/14/2007   Allergic rhinitis 04/07/2007   DERMATITIS DUE TO SOLAR RADIATION 04/07/2007   Premature  beat 09/20/2006   DIVERTICULOSIS, COLON 04/13/2002    PCP: Doreene Burke, MD  REFERRING PROVIDER: Doreene Burke, MD  REFERRING DIAG: back, hip and neck pain  Rationale for Evaluation and Treatment: Rehabilitation  THERAPY DIAG:  Other low back pain  Cervicalgia  Pain in left hip  Muscle weakness (generalized)  ONSET DATE: 11/13/2022  SUBJECTIVE:                                                                                                                                                                                           SUBJECTIVE STATEMENT: Patient with left hip pain for several months, she reports that it is worse recently, reports pain in the left buttock, to the left lateral thigh and into the  left groin.  X-rays show mild DJD, She reports today that the pain is on the left buttock and thigh, she reports doing the stretches, reports two time having a catch pain up to 8/10, catch is in the groin today  PERTINENT HISTORY:  See chart  PAIN:  Are you having pain? Yes: NPRS scale: 4/10 Pain location: left buttock, also neck pain Pain description: sharp, tight, sore Aggravating factors: sometimes just happens, twist pain up to 10/10 Relieving factors: stretching at best pain can be 0/10   PRECAUTIONS: None  RED FLAGS: None   WEIGHT BEARING RESTRICTIONS: No  FALLS:  Has patient fallen in last 6 months? Yes. Number of falls 1 while playing pickle ball  LIVING ENVIRONMENT: Lives with: lives with their family and lives with their spouse Lives in: House/apartment Stairs: No Has following equipment at home: None  OCCUPATION: retired  PLOF: Independent and walks, pickle ball, travels  PATIENT GOALS: have less pain, move better, be more flexible  NEXT MD VISIT: none  OBJECTIVE:  Note: Objective measures were completed at Evaluation unless otherwise noted.  DIAGNOSTIC FINDINGS:  Recent x-rays but not read by the radiologist  PATIENT SURVEYS:  FOTO 49  COGNITION: Overall cognitive status: Within functional limits for tasks assessed     SENSATION: WFL  MUSCLE LENGTH: SLR 40 degrees pain  bilateral, very tight in the piriformis, extremely tight in the calves and the adductors,   POSTURE:  good sitting posture  PALPATION: Very tender in the left buttock, mild tenderness in the left low back , significant tenderness in the neck especially the left  LUMBAR ROM:   AROM eval  Flexion Decreased 50%  Extension Decreased 50%  Right lateral flexion 50%  Left lateral flexion 50%  Right rotation   Left rotation    (Blank rows = not tested)  CERVICAL ROM:  decreased 25% with pain at  end range   LOWER EXTREMITY ROM:     Active  Right eval Left eval  Hip  flexion    Hip extension    Hip abduction    Hip adduction    Hip internal rotation    Hip external rotation    Knee flexion    Knee extension    Ankle dorsiflexion    Ankle plantarflexion    Ankle inversion    Ankle eversion     (Blank rows = not tested)  LOWER EXTREMITY MMT:    MMT Right eval Left eval  Hip flexion  3+  Hip extension  4  Hip abduction  3+  Hip adduction    Hip internal rotation    Hip external rotation    Knee flexion    Knee extension    Ankle dorsiflexion    Ankle plantarflexion    Ankle inversion    Ankle eversion     (Blank rows = not tested)  LUMBAR SPECIAL TESTS:  Straight leg raise test: Positive, Slump test: Positive, and Thomas test: Positive  GAIT: Distance walked: 60 feet Assistive device utilized: None Level of assistance: Complete Independence Comments: mild antalgic gait on the right, reports that at times if she does a pivot she will have very sharp pain  TODAY'S TREATMENT:                                                                                                                              DATE:   12/17/22 Nustep level 5 x 5 minutes 20# leg curls 2x10 5# leg extension 20# leg press Passive stretch LE's all motions STM to the left thigh, buttock, worked with MR on the left adductor also did right thigh  12/07/22 Nustep level 5 x 5 minutes 2.5# marches, hip abduction and extension Leg press 20# 2x10 Passive HS and piriformis stretches Green tband clamshells Ball b/n knees squeeze Feet on ball bridge Isometric abs with ball push STM to the left upper trap and neck and the left buttock  PATIENT EDUCATION:  Education details: POC/HEP Person educated: Patient Education method: Programmer, multimedia, Facilities manager, and Verbal cues Education comprehension: verbalized understanding  HOME EXERCISE PROGRAM: 11/18/22:  HS, calf, piriformis and adductor stretch  ASSESSMENT:  CLINICAL IMPRESSION: Patient is a 72 y.o. female who  was seen today for physical therapy evaluation and treatment for back hip and neck pain. Reports that she has been doing the stretches and feels like she is moving better,Very tight in the adductors and piriformis, she does have issues with lying down causing dizziness so all activities were done with head elevated.  She will be going to [facility] next week  OBJECTIVE IMPAIRMENTS: Abnormal gait, cardiopulmonary status limiting activity, decreased activity tolerance, decreased balance, decreased endurance, decreased mobility, difficulty walking, decreased ROM, decreased strength, dizziness, increased muscle spasms, impaired flexibility, improper body mechanics, postural dysfunction, and pain.   REHAB POTENTIAL: Good  CLINICAL DECISION MAKING: Stable/uncomplicated  EVALUATION  COMPLEXITY: Low   GOALS: Goals reviewed with patient? Yes  SHORT TERM GOALS: Target date: 12/02/22  Independent with initial HEP Goal status: met 12/07/22  LONG TERM GOALS: Target date: 02/09/23  Independent with advanced HEP Goal status: INITIAL  2.  Decrease pain overall with ADL's 50% Goal status: INITIAL  3.  Increase lumbar ROM 25% Goal status: progressing 12/17/22  4.  Increase SLR to 65 degrees Goal status: INITIAL  5.  Walk 2 miles without an increase of pain Goal status: ongoing 12/17/22  6.  Increase cervical ROM 25% Goal status: INITIAL  PLAN:  PT FREQUENCY: 1-2x/week  PT DURATION: 10 weeks  PLANNED INTERVENTIONS: Therapeutic exercises, Therapeutic activity, Neuromuscular re-education, Balance training, Gait training, Patient/Family education, Self Care, Joint mobilization, Joint manipulation, Stair training, Dry Needling, Electrical stimulation, Spinal mobilization, Cryotherapy, Moist heat, Taping, Traction, Ultrasound, Ionotophoresis 4mg /ml Dexamethasone, and Manual therapy.  PLAN FOR NEXT SESSION: work on flexibility, STM if she tolerates, she gets dizzy when lying down   [employer], PT 12/17/2022, 4:14 PM

## 2022-12-22 ENCOUNTER — Encounter: Payer: Self-pay | Admitting: Physical Therapy

## 2022-12-22 ENCOUNTER — Ambulatory Visit: Payer: Medicare PPO | Admitting: Physical Therapy

## 2022-12-22 DIAGNOSIS — M6281 Muscle weakness (generalized): Secondary | ICD-10-CM | POA: Diagnosis not present

## 2022-12-22 DIAGNOSIS — M542 Cervicalgia: Secondary | ICD-10-CM

## 2022-12-22 DIAGNOSIS — M5459 Other low back pain: Secondary | ICD-10-CM | POA: Diagnosis not present

## 2022-12-22 DIAGNOSIS — M25552 Pain in left hip: Secondary | ICD-10-CM

## 2022-12-22 NOTE — Therapy (Signed)
OUTPATIENT PHYSICAL THERAPY THORACOLUMBAR TREATMENT   Patient Name: [name] MRN: [id number] DOB:[birth year], 72 y.o., female Today's Date: 12/22/2022  END OF SESSION:  PT End of Session - 12/22/22 1357     Visit Number 4    Number of Visits 13    Date for PT Re-Evaluation 02/09/23    Authorization Type [employer]    PT Start Time 1357    PT Stop Time 1442    PT Time Calculation (min) 45 min    Activity Tolerance Patient tolerated treatment well    Behavior During Therapy WFL for tasks assessed/performed             Past Medical History:  Diagnosis Date   Anal fissure    Anemia    during pregnancy   Anxiety    Basal cell carcinoma    DDD (degenerative disc disease)    Diverticulosis    Hypokalemia    LBP (low back pain)    Melanoma (HCC)    Migraine    MVP (mitral valve prolapse) 1990   psa   Rosacea    Ulcer    as a child   Past Surgical History:  Procedure Laterality Date   ABDOMINAL HYSTERECTOMY  1985   BREAST BIOPSY Left    secondary to infection   I & D EXTREMITY Right 09/09/2012   Procedure: Minor IRRIGATION AND DEBRIDEMENT EXTREMITY paronychia of right thumb;  Surgeon: Nicki Reaper, MD;  Location: [facility];  Service: Orthopedics;  Laterality: Right;   I & D EXTREMITY Left 01/26/2014   Procedure: MINOR IINCISION AND DRAINAGE paronychia left index finger;  Surgeon: Cindee Salt, MD;  Location: [facility];  Service: Orthopedics;  Laterality: Left;  left index   OOPHORECTOMY Left 1994   REPAIR KNEE LIGAMENT Left 1989   TONSILLECTOMY  1955   TUBAL LIGATION     VEIN SURGERY     Patient Active Problem List   Diagnosis Date Noted   Pain of left hip 11/13/2022   Cervical strain 11/13/2022   Sciatica of right side 09/30/2022   B12 deficiency 09/30/2022   Anxiety 09/30/2022   Agatston coronary artery calcium score between 200 and 399 08/03/2022   Osteopenia of necks of both femurs 06/23/2022   Dupuytren's contracture of  both hands 06/23/2022   Low TSH level 05/20/2022   Prediabetes 05/20/2022   Cellulitis of finger of right hand 09/08/2021   Infection of skin 05/13/2021   Cervicalgia 05/06/2021   Grieving 05/06/2021   Conductive hearing loss of right ear 03/04/2021   Impacted cerumen of right ear 03/04/2021   Medication side effect 03/03/2021   Excessive cerumen in right ear canal 03/03/2021   Labyrinthitis 07/30/2020   Palpitations 04/15/2020   Plantar fasciitis 04/15/2020   Dysthymia 04/15/2020   Vitamin D deficiency 04/15/2020   Gastroesophageal reflux disease 04/15/2020   Insect bite of left foot 11/24/2019   Pustule 11/24/2019   Pain of right thumb 03/09/2019   External hemorrhoids 03/03/2018   Other chest pain 02/23/2018   Insomnia 02/24/2017   Healthcare maintenance 02/24/2017   Conjunctivitis 03/01/2014   Seborrheic keratosis, inflamed 01/05/2012   Menopausal syndrome 12/22/2011   Encounter for counseling 11/11/2011   Skin cancer 07/14/2011   Basal cell carcinoma of face 03/10/2011   Migraine 05/14/2007   IRRITABLE BOWEL SYNDROME 05/14/2007   History of mitral valve prolapse 05/14/2007   Allergic rhinitis 04/07/2007   DERMATITIS DUE TO SOLAR RADIATION 04/07/2007   Premature  beat 09/20/2006   DIVERTICULOSIS, COLON 04/13/2002    PCP: Doreene Burke, MD  REFERRING PROVIDER: Doreene Burke, MD  REFERRING DIAG: back, hip and neck pain  Rationale for Evaluation and Treatment: Rehabilitation  THERAPY DIAG:  Other low back pain  Cervicalgia  Pain in left hip  Muscle weakness (generalized)  ONSET DATE: 11/13/2022  SUBJECTIVE:                                                                                                                                                                                           SUBJECTIVE STATEMENT: Patient with left hip pain for several months, she reports that it is worse recently, reports pain in the left buttock, to the left lateral thigh and into the  left groin.  X-rays show mild DJD, Has been doing okay, reports that she drove to [facility] yesterday and started having pain after about an hour of the drive  PERTINENT HISTORY:  See chart  PAIN:  Are you having pain? Yes: NPRS scale: 4/10 Pain location: left buttock, also neck pain Pain description: sharp, tight, sore Aggravating factors: sometimes just happens, twist pain up to 10/10 Relieving factors: stretching at best pain can be 0/10   PRECAUTIONS: None  RED FLAGS: None   WEIGHT BEARING RESTRICTIONS: No  FALLS:  Has patient fallen in last 6 months? Yes. Number of falls 1 while playing pickle ball  LIVING ENVIRONMENT: Lives with: lives with their family and lives with their spouse Lives in: House/apartment Stairs: No Has following equipment at home: None  OCCUPATION: retired  PLOF: Independent and walks, pickle ball, travels  PATIENT GOALS: have less pain, move better, be more flexible  NEXT MD VISIT: none  OBJECTIVE:  Note: Objective measures were completed at Evaluation unless otherwise noted.  DIAGNOSTIC FINDINGS:  Recent x-rays but not read by the radiologist  PATIENT SURVEYS:  FOTO 49  COGNITION: Overall cognitive status: Within functional limits for tasks assessed     SENSATION: WFL  MUSCLE LENGTH: SLR 40 degrees pain  bilateral, very tight in the piriformis, extremely tight in the calves and the adductors,   POSTURE:  good sitting posture  PALPATION: Very tender in the left buttock, mild tenderness in the left low back , significant tenderness in the neck especially the left  LUMBAR ROM:   AROM eval  Flexion Decreased 50%  Extension Decreased 50%  Right lateral flexion 50%  Left lateral flexion 50%  Right rotation   Left rotation    (Blank rows = not tested)  CERVICAL ROM:  decreased 25% with pain at end range   LOWER EXTREMITY ROM:     Active  Right eval Left eval  Hip flexion    Hip extension    Hip abduction    Hip adduction     Hip internal rotation    Hip external rotation    Knee flexion    Knee extension    Ankle dorsiflexion    Ankle plantarflexion    Ankle inversion    Ankle eversion     (Blank rows = not tested)  LOWER EXTREMITY MMT:    MMT Right eval Left eval  Hip flexion  3+  Hip extension  4  Hip abduction  3+  Hip adduction    Hip internal rotation    Hip external rotation    Knee flexion    Knee extension    Ankle dorsiflexion    Ankle plantarflexion    Ankle inversion    Ankle eversion     (Blank rows = not tested)  LUMBAR SPECIAL TESTS:  Straight leg raise test: Positive, Slump test: Positive, and Thomas test: Positive  GAIT: Distance walked: 60 feet Assistive device utilized: None Level of assistance: Complete Independence Comments: mild antalgic gait on the right, reports that at times if she does a pivot she will have very sharp pain  TODAY'S TREATMENT:                                                                                                                              DATE:   12/22/22 Gait around the back building nice pace, no rest Leg curls 20# 2x10 Leg extension 5# 2x10 Red tband hip extension and abduction Green tband clamshells Ball b/n knees squeeze Passive stretch LE's STM to the buttocks  12/17/22 Nustep level 5 x 5 minutes 20# leg curls 2x10 5# leg extension 20# leg press Passive stretch LE's all motions STM to the left thigh, buttock, worked with MR on the left adductor also did right thigh  12/07/22 Nustep level 5 x 5 minutes 2.5# marches, hip abduction and extension Leg press 20# 2x10 Passive HS and piriformis stretches Green tband clamshells Ball b/n knees squeeze Feet on ball bridge Isometric abs with ball push STM to the left upper trap and neck and the left buttock  PATIENT EDUCATION:  Education details: POC/HEP Person educated: Patient Education method: Programmer, multimedia, Facilities manager, and Verbal cues Education comprehension:  verbalized understanding  HOME EXERCISE PROGRAM: 11/18/22:  HS, calf, piriformis and adductor stretch  ASSESSMENT:  CLINICAL IMPRESSION: Patient is a 72 y.o. female who was seen today for physical therapy evaluation and treatment for back hip and neck pain.She is doing stretches and feels that she is improving, but had an increase of pain with driving to [place], She will be going to [place] on Friday and that is a long drive, gave her some information about how to stretch in the car and how to do some STM with a tennis ball  OBJECTIVE IMPAIRMENTS: Abnormal gait, cardiopulmonary status limiting activity, decreased activity tolerance, decreased balance, decreased endurance,  decreased mobility, difficulty walking, decreased ROM, decreased strength, dizziness, increased muscle spasms, impaired flexibility, improper body mechanics, postural dysfunction, and pain.   REHAB POTENTIAL: Good  CLINICAL DECISION MAKING: Stable/uncomplicated  EVALUATION COMPLEXITY: Low   GOALS: Goals reviewed with patient? Yes  SHORT TERM GOALS: Target date: 12/02/22  Independent with initial HEP Goal status: met 12/07/22  LONG TERM GOALS: Target date: 02/09/23  Independent with advanced HEP Goal status: INITIAL  2.  Decrease pain overall with ADL's 50% Goal status: INITIAL  3.  Increase lumbar ROM 25% Goal status: progressing 12/17/22  4.  Increase SLR to 65 degrees Goal status: INITIAL  5.  Walk 2 miles without an increase of pain Goal status: ongoing 12/17/22  6.  Increase cervical ROM 25% Goal status: ongoing 12/22/22  PLAN:  PT FREQUENCY: 1-2x/week  PT DURATION: 10 weeks  PLANNED INTERVENTIONS: Therapeutic exercises, Therapeutic activity, Neuromuscular re-education, Balance training, Gait training, Patient/Family education, Self Care, Joint mobilization, Joint manipulation, Stair training, Dry Needling, Electrical stimulation, Spinal mobilization, Cryotherapy, Moist heat, Taping, Traction,  Ultrasound, Ionotophoresis 4mg /ml Dexamethasone, and Manual therapy.  PLAN FOR NEXT SESSION: see how her trip to [place] goes   [employer], PT 12/22/2022, 1:58 PM

## 2022-12-23 DIAGNOSIS — L309 Dermatitis, unspecified: Secondary | ICD-10-CM | POA: Diagnosis not present

## 2022-12-23 DIAGNOSIS — M1990 Unspecified osteoarthritis, unspecified site: Secondary | ICD-10-CM | POA: Diagnosis not present

## 2022-12-23 DIAGNOSIS — G43909 Migraine, unspecified, not intractable, without status migrainosus: Secondary | ICD-10-CM | POA: Diagnosis not present

## 2022-12-23 DIAGNOSIS — E785 Hyperlipidemia, unspecified: Secondary | ICD-10-CM | POA: Diagnosis not present

## 2022-12-23 DIAGNOSIS — J309 Allergic rhinitis, unspecified: Secondary | ICD-10-CM | POA: Diagnosis not present

## 2022-12-23 DIAGNOSIS — I10 Essential (primary) hypertension: Secondary | ICD-10-CM | POA: Diagnosis not present

## 2022-12-23 DIAGNOSIS — F419 Anxiety disorder, unspecified: Secondary | ICD-10-CM | POA: Diagnosis not present

## 2022-12-23 DIAGNOSIS — H269 Unspecified cataract: Secondary | ICD-10-CM | POA: Diagnosis not present

## 2022-12-23 DIAGNOSIS — Z860109 Personal history of other colon polyps: Secondary | ICD-10-CM | POA: Diagnosis not present

## 2022-12-23 DIAGNOSIS — K219 Gastro-esophageal reflux disease without esophagitis: Secondary | ICD-10-CM | POA: Diagnosis not present

## 2022-12-23 DIAGNOSIS — R7303 Prediabetes: Secondary | ICD-10-CM | POA: Diagnosis not present

## 2023-01-11 ENCOUNTER — Encounter: Payer: Self-pay | Admitting: Family Medicine

## 2023-01-12 ENCOUNTER — Encounter: Payer: Self-pay | Admitting: Physical Therapy

## 2023-01-12 ENCOUNTER — Ambulatory Visit: Payer: Medicare PPO | Attending: Family Medicine | Admitting: Physical Therapy

## 2023-01-12 DIAGNOSIS — M25552 Pain in left hip: Secondary | ICD-10-CM | POA: Diagnosis not present

## 2023-01-12 DIAGNOSIS — M542 Cervicalgia: Secondary | ICD-10-CM | POA: Diagnosis not present

## 2023-01-12 DIAGNOSIS — M6281 Muscle weakness (generalized): Secondary | ICD-10-CM | POA: Diagnosis not present

## 2023-01-12 DIAGNOSIS — M5459 Other low back pain: Secondary | ICD-10-CM | POA: Insufficient documentation

## 2023-01-12 NOTE — Therapy (Signed)
OUTPATIENT PHYSICAL THERAPY THORACOLUMBAR TREATMENT   Patient Name: [name] MRN: [id number] DOB:[birth year], 72 y.o., female Today's Date: 01/12/2023  END OF SESSION:  PT End of Session - 01/12/23 1610     Visit Number 5    Number of Visits 13    Date for PT Re-Evaluation 02/09/23    Authorization Type [employer]    PT Start Time 1610    PT Stop Time 1655    PT Time Calculation (min) 45 min    Activity Tolerance Patient tolerated treatment well    Behavior During Therapy WFL for tasks assessed/performed             Past Medical History:  Diagnosis Date   Anal fissure    Anemia    during pregnancy   Anxiety    Basal cell carcinoma    DDD (degenerative disc disease)    Diverticulosis    Hypokalemia    LBP (low back pain)    Melanoma (HCC)    Migraine    MVP (mitral valve prolapse) 1990   psa   Rosacea    Ulcer    as a child   Past Surgical History:  Procedure Laterality Date   ABDOMINAL HYSTERECTOMY  1985   BREAST BIOPSY Left    secondary to infection   I & D EXTREMITY Right 09/09/2012   Procedure: Minor IRRIGATION AND DEBRIDEMENT EXTREMITY paronychia of right thumb;  Surgeon: Nicki Reaper, MD;  Location: [facility];  Service: Orthopedics;  Laterality: Right;   I & D EXTREMITY Left 01/26/2014   Procedure: MINOR IINCISION AND DRAINAGE paronychia left index finger;  Surgeon: Cindee Salt, MD;  Location: [facility];  Service: Orthopedics;  Laterality: Left;  left index   OOPHORECTOMY Left 1994   REPAIR KNEE LIGAMENT Left 1989   TONSILLECTOMY  1955   TUBAL LIGATION     VEIN SURGERY     Patient Active Problem List   Diagnosis Date Noted   Pain of left hip 11/13/2022   Cervical strain 11/13/2022   Sciatica of right side 09/30/2022   B12 deficiency 09/30/2022   Anxiety 09/30/2022   Agatston coronary artery calcium score between 200 and 399 08/03/2022   Osteopenia of necks of both femurs 06/23/2022   Dupuytren's contracture of  both hands 06/23/2022   Low TSH level 05/20/2022   Prediabetes 05/20/2022   Cellulitis of finger of right hand 09/08/2021   Infection of skin 05/13/2021   Cervicalgia 05/06/2021   Grieving 05/06/2021   Conductive hearing loss of right ear 03/04/2021   Impacted cerumen of right ear 03/04/2021   Medication side effect 03/03/2021   Excessive cerumen in right ear canal 03/03/2021   Labyrinthitis 07/30/2020   Palpitations 04/15/2020   Plantar fasciitis 04/15/2020   Dysthymia 04/15/2020   Vitamin D deficiency 04/15/2020   Gastroesophageal reflux disease 04/15/2020   Insect bite of left foot 11/24/2019   Pustule 11/24/2019   Pain of right thumb 03/09/2019   External hemorrhoids 03/03/2018   Other chest pain 02/23/2018   Insomnia 02/24/2017   Healthcare maintenance 02/24/2017   Conjunctivitis 03/01/2014   Seborrheic keratosis, inflamed 01/05/2012   Menopausal syndrome 12/22/2011   Encounter for counseling 11/11/2011   Skin cancer 07/14/2011   Basal cell carcinoma of face 03/10/2011   Migraine 05/14/2007   IRRITABLE BOWEL SYNDROME 05/14/2007   History of mitral valve prolapse 05/14/2007   Allergic rhinitis 04/07/2007   DERMATITIS DUE TO SOLAR RADIATION 04/07/2007   Premature  beat 09/20/2006   DIVERTICULOSIS, COLON 04/13/2002    PCP: Doreene Burke, MD  REFERRING PROVIDER: Doreene Burke, MD  REFERRING DIAG: back, hip and neck pain  Rationale for Evaluation and Treatment: Rehabilitation  THERAPY DIAG:  Other low back pain  Cervicalgia  Pain in left hip  Muscle weakness (generalized)  ONSET DATE: 11/13/2022  SUBJECTIVE:                                                                                                                                                                                           SUBJECTIVE STATEMENT: Patient reports that she went to [facility] and really did well, had steps of 12,000 up to 22,000 over a 5 day period, and report no real issues until she was coming  back she started having some pain, reports some right arm pain when she moves it up to 10/10.  PERTINENT HISTORY:  See chart  PAIN:  Are you having pain? Yes: NPRS scale: 4/10 Pain location: left buttock, also neck pain Pain description: sharp, tight, sore Aggravating factors: sometimes just happens, twist pain up to 10/10 Relieving factors: stretching at best pain can be 0/10   PRECAUTIONS: None  RED FLAGS: None   WEIGHT BEARING RESTRICTIONS: No  FALLS:  Has patient fallen in last 6 months? Yes. Number of falls 1 while playing pickle ball  LIVING ENVIRONMENT: Lives with: lives with their family and lives with their spouse Lives in: House/apartment Stairs: No Has following equipment at home: None  OCCUPATION: retired  PLOF: Independent and walks, pickle ball, travels  PATIENT GOALS: have less pain, move better, be more flexible  NEXT MD VISIT: none  OBJECTIVE:  Note: Objective measures were completed at Evaluation unless otherwise noted.  DIAGNOSTIC FINDINGS:  Recent x-rays but not read by the radiologist  PATIENT SURVEYS:  FOTO 49  COGNITION: Overall cognitive status: Within functional limits for tasks assessed     SENSATION: WFL  MUSCLE LENGTH: SLR 40 degrees pain  bilateral, very tight in the piriformis, extremely tight in the calves and the adductors,   POSTURE:  good sitting posture  PALPATION: Very tender in the left buttock, mild tenderness in the left low back , significant tenderness in the neck especially the left  LUMBAR ROM:   AROM eval  Flexion Decreased 50%  Extension Decreased 50%  Right lateral flexion 50%  Left lateral flexion 50%  Right rotation   Left rotation    (Blank rows = not tested)  CERVICAL ROM:  decreased 25% with pain at end range   LOWER EXTREMITY ROM:     Active  Right eval Left eval  Hip flexion  Hip extension    Hip abduction    Hip adduction    Hip internal rotation    Hip external rotation    Knee  flexion    Knee extension    Ankle dorsiflexion    Ankle plantarflexion    Ankle inversion    Ankle eversion     (Blank rows = not tested)  LOWER EXTREMITY MMT:    MMT Right eval Left eval  Hip flexion  3+  Hip extension  4  Hip abduction  3+  Hip adduction    Hip internal rotation    Hip external rotation    Knee flexion    Knee extension    Ankle dorsiflexion    Ankle plantarflexion    Ankle inversion    Ankle eversion     (Blank rows = not tested)  LUMBAR SPECIAL TESTS:  Straight leg raise test: Positive, Slump test: Positive, and Thomas test: Positive  GAIT: Distance walked: 60 feet Assistive device utilized: None Level of assistance: Complete Independence Comments: mild antalgic gait on the right, reports that at times if she does a pivot she will have very sharp pain  TODAY'S TREATMENT:                                                                                                                              DATE:   01/12/23 Nustep level 5 x 6 minutes Calf stretch LE stretches passively, Right shoulder passive stretches Clamshells Ball b/n knees squeeze STM to the right upper arm and the buttock and low back  12/22/22 Gait around the back building nice pace, no rest Leg curls 20# 2x10 Leg extension 5# 2x10 Red tband hip extension and abduction Green tband clamshells Ball b/n knees squeeze Passive stretch LE's STM to the buttocks  12/17/22 Nustep level 5 x 5 minutes 20# leg curls 2x10 5# leg extension 20# leg press Passive stretch LE's all motions STM to the left thigh, buttock, worked with MR on the left adductor also did right thigh  12/07/22 Nustep level 5 x 5 minutes 2.5# marches, hip abduction and extension Leg press 20# 2x10 Passive HS and piriformis stretches Green tband clamshells Ball b/n knees squeeze Feet on ball bridge Isometric abs with ball push STM to the left upper trap and neck and the left buttock  PATIENT EDUCATION:   Education details: POC/HEP Person educated: Patient Education method: Programmer, multimedia, Facilities manager, and Verbal cues Education comprehension: verbalized understanding  HOME EXERCISE PROGRAM: 11/18/22:  HS, calf, piriformis and adductor stretch  ASSESSMENT:  CLINICAL IMPRESSION: Patient is a 72 y.o. female who was seen today for physical therapy evaluation and treatment for back hip and neck pain.She went away to [facility] and did a lot of walking and reports that she did very well, really no pain, just some soreness and stiffness, reports that when she got back she had an increase of hip and right arm pain, she is unsure why, she  was very tender, had good passive ROM  OBJECTIVE IMPAIRMENTS: Abnormal gait, cardiopulmonary status limiting activity, decreased activity tolerance, decreased balance, decreased endurance, decreased mobility, difficulty walking, decreased ROM, decreased strength, dizziness, increased muscle spasms, impaired flexibility, improper body mechanics, postural dysfunction, and pain.   REHAB POTENTIAL: Good  CLINICAL DECISION MAKING: Stable/uncomplicated  EVALUATION COMPLEXITY: Low   GOALS: Goals reviewed with patient? Yes  SHORT TERM GOALS: Target date: 12/02/22  Independent with initial HEP Goal status: met 12/07/22  LONG TERM GOALS: Target date: 02/09/23  Independent with advanced HEP Goal status: INITIAL  2.  Decrease pain overall with ADL's 50% Goal status: INITIAL  3.  Increase lumbar ROM 25% Goal status: progressing 12/17/22  4.  Increase SLR to 65 degrees Goal status: progressing 01/12/23  5.  Walk 2 miles without an increase of pain Goal status: met 01/12/23  6.  Increase cervical ROM 25% Goal status: ongoing 12/22/22  PLAN:  PT FREQUENCY: 1-2x/week  PT DURATION: 10 weeks  PLANNED INTERVENTIONS: Therapeutic exercises, Therapeutic activity, Neuromuscular re-education, Balance training, Gait training, Patient/Family education, Self Care, Joint  mobilization, Joint manipulation, Stair training, Dry Needling, Electrical stimulation, Spinal mobilization, Cryotherapy, Moist heat, Taping, Traction, Ultrasound, Ionotophoresis 4mg /ml Dexamethasone, and Manual therapy.  PLAN FOR NEXT SESSION: will work on strength and function   [employer], PT 01/12/2023, 4:11 PM

## 2023-01-13 ENCOUNTER — Other Ambulatory Visit: Payer: Self-pay | Admitting: Emergency Medicine

## 2023-01-13 ENCOUNTER — Other Ambulatory Visit: Payer: Self-pay

## 2023-01-13 DIAGNOSIS — E785 Hyperlipidemia, unspecified: Secondary | ICD-10-CM

## 2023-01-13 DIAGNOSIS — Z79899 Other long term (current) drug therapy: Secondary | ICD-10-CM

## 2023-01-19 ENCOUNTER — Ambulatory Visit: Payer: Medicare PPO | Admitting: Family Medicine

## 2023-01-19 ENCOUNTER — Encounter: Payer: Medicare PPO | Admitting: Plastic Surgery

## 2023-01-21 ENCOUNTER — Telehealth: Payer: Self-pay | Admitting: Family Medicine

## 2023-01-21 ENCOUNTER — Ambulatory Visit: Payer: Medicare PPO | Admitting: Family Medicine

## 2023-01-21 ENCOUNTER — Encounter: Payer: Self-pay | Admitting: Physical Therapy

## 2023-01-21 ENCOUNTER — Ambulatory Visit: Payer: Medicare PPO | Admitting: Physical Therapy

## 2023-01-21 DIAGNOSIS — M5459 Other low back pain: Secondary | ICD-10-CM

## 2023-01-21 DIAGNOSIS — M6281 Muscle weakness (generalized): Secondary | ICD-10-CM

## 2023-01-21 DIAGNOSIS — M542 Cervicalgia: Secondary | ICD-10-CM

## 2023-01-21 DIAGNOSIS — M25552 Pain in left hip: Secondary | ICD-10-CM

## 2023-01-21 MED ORDER — METFORMIN HCL ER 500 MG PO TB24: 500.0000 mg | ORAL_TABLET | Freq: Every evening | ORAL | 0 refills | Status: DC

## 2023-01-21 NOTE — Telephone Encounter (Signed)
Lft VM to rtn call. Dm/cma  

## 2023-01-21 NOTE — Telephone Encounter (Signed)
Rx sent in for Metformin XR 500 mg  Patient notified VIA phone. Dm/cma

## 2023-01-21 NOTE — Therapy (Signed)
OUTPATIENT PHYSICAL THERAPY THORACOLUMBAR TREATMENT   Patient Name: [name] MRN: [id number] DOB:06/29/1950, 72 y.o., female Today's Date: 01/21/2023  END OF SESSION:  PT End of Session - 01/21/23 1426     Visit Number 6    Date for PT Re-Evaluation 02/09/23    Authorization Type [employer] 5/10    PT Start Time 1355    PT Stop Time 1438    PT Time Calculation (min) 43 min    Activity Tolerance Patient tolerated treatment well    Behavior During Therapy WFL for tasks assessed/performed             Past Medical History:  Diagnosis Date   Anal fissure    Anemia    during pregnancy   Anxiety    Basal cell carcinoma    DDD (degenerative disc disease)    Diverticulosis    Hypokalemia    LBP (low back pain)    Melanoma (HCC)    Migraine    MVP (mitral valve prolapse) 1990   psa   Rosacea    Ulcer    as a child   Past Surgical History:  Procedure Laterality Date   ABDOMINAL HYSTERECTOMY  1985   BREAST BIOPSY Left    secondary to infection   I & D EXTREMITY Right 09/09/2012   Procedure: Minor IRRIGATION AND DEBRIDEMENT EXTREMITY paronychia of right thumb;  Surgeon: Nicki Reaper, MD;  Location: [facility];  Service: Orthopedics;  Laterality: Right;   I & D EXTREMITY Left 01/26/2014   Procedure: MINOR IINCISION AND DRAINAGE paronychia left index finger;  Surgeon: Cindee Salt, MD;  Location: [facility];  Service: Orthopedics;  Laterality: Left;  left index   OOPHORECTOMY Left 1994   REPAIR KNEE LIGAMENT Left 1989   TONSILLECTOMY  1955   TUBAL LIGATION     VEIN SURGERY     Patient Active Problem List   Diagnosis Date Noted   Pain of left hip 11/13/2022   Cervical strain 11/13/2022   Sciatica of right side 09/30/2022   B12 deficiency 09/30/2022   Anxiety 09/30/2022   Agatston coronary artery calcium score between 200 and 399 08/03/2022   Osteopenia of necks of both femurs 06/23/2022   Dupuytren's contracture of both hands 06/23/2022    Low TSH level 05/20/2022   Prediabetes 05/20/2022   Cellulitis of finger of right hand 09/08/2021   Infection of skin 05/13/2021   Cervicalgia 05/06/2021   Grieving 05/06/2021   Conductive hearing loss of right ear 03/04/2021   Impacted cerumen of right ear 03/04/2021   Medication side effect 03/03/2021   Excessive cerumen in right ear canal 03/03/2021   Labyrinthitis 07/30/2020   Palpitations 04/15/2020   Plantar fasciitis 04/15/2020   Dysthymia 04/15/2020   Vitamin D deficiency 04/15/2020   Gastroesophageal reflux disease 04/15/2020   Insect bite of left foot 11/24/2019   Pustule 11/24/2019   Pain of right thumb 03/09/2019   External hemorrhoids 03/03/2018   Other chest pain 02/23/2018   Insomnia 02/24/2017   Healthcare maintenance 02/24/2017   Conjunctivitis 03/01/2014   Seborrheic keratosis, inflamed 01/05/2012   Menopausal syndrome 12/22/2011   Encounter for counseling 11/11/2011   Skin cancer 07/14/2011   Basal cell carcinoma of face 03/10/2011   Migraine 05/14/2007   IRRITABLE BOWEL SYNDROME 05/14/2007   History of mitral valve prolapse 05/14/2007   Allergic rhinitis 04/07/2007   DERMATITIS DUE TO SOLAR RADIATION 04/07/2007   Premature beat 09/20/2006   DIVERTICULOSIS, COLON  04/13/2002    PCP: Doreene Burke, MD  REFERRING PROVIDER: Doreene Burke, MD  REFERRING DIAG: back, hip and neck pain  Rationale for Evaluation and Treatment: Rehabilitation  THERAPY DIAG:  Other low back pain  Cervicalgia  Pain in left hip  Muscle weakness (generalized)  ONSET DATE: 11/13/2022  SUBJECTIVE:                                                                                                                                                                                           SUBJECTIVE STATEMENT: Patient reports that she has been doing well overall, less hip and back pain, reports right shoulder pain rated a 4/10 at rest but will increase with activity  PERTINENT HISTORY:   See chart  PAIN:  Are you having pain? Yes: NPRS scale: 4/10 Pain location: left buttock, also neck pain Pain description: sharp, tight, sore Aggravating factors: sometimes just happens, twist pain up to 10/10 Relieving factors: stretching at best pain can be 0/10   PRECAUTIONS: None  RED FLAGS: None   WEIGHT BEARING RESTRICTIONS: No  FALLS:  Has patient fallen in last 6 months? Yes. Number of falls 1 while playing pickle ball  LIVING ENVIRONMENT: Lives with: lives with their family and lives with their spouse Lives in: House/apartment Stairs: No Has following equipment at home: None  OCCUPATION: retired  PLOF: Independent and walks, pickle ball, travels  PATIENT GOALS: have less pain, move better, be more flexible  NEXT MD VISIT: none  OBJECTIVE:  Note: Objective measures were completed at Evaluation unless otherwise noted.  DIAGNOSTIC FINDINGS:  Recent x-rays but not read by the radiologist  PATIENT SURVEYS:  FOTO 49  COGNITION: Overall cognitive status: Within functional limits for tasks assessed     SENSATION: WFL  MUSCLE LENGTH: SLR 40 degrees pain  bilateral, very tight in the piriformis, extremely tight in the calves and the adductors,   POSTURE:  good sitting posture  PALPATION: Very tender in the left buttock, mild tenderness in the left low back , significant tenderness in the neck especially the left  LUMBAR ROM:   AROM eval  Flexion Decreased 50%  Extension Decreased 50%  Right lateral flexion 50%  Left lateral flexion 50%  Right rotation   Left rotation    (Blank rows = not tested)  CERVICAL ROM:  decreased 25% with pain at end range   LOWER EXTREMITY ROM:     Active  Right eval Left eval  Hip flexion    Hip extension    Hip abduction    Hip adduction    Hip internal rotation    Hip external rotation  Knee flexion    Knee extension    Ankle dorsiflexion    Ankle plantarflexion    Ankle inversion    Ankle eversion      (Blank rows = not tested)  LOWER EXTREMITY MMT:    MMT Right eval Left eval  Hip flexion  3+  Hip extension  4  Hip abduction  3+  Hip adduction    Hip internal rotation    Hip external rotation    Knee flexion    Knee extension    Ankle dorsiflexion    Ankle plantarflexion    Ankle inversion    Ankle eversion     (Blank rows = not tested)  LUMBAR SPECIAL TESTS:  Straight leg raise test: Positive, Slump test: Positive, and Thomas test: Positive  GAIT: Distance walked: 60 feet Assistive device utilized: None Level of assistance: Complete Independence Comments: mild antalgic gait on the right, reports that at times if she does a pivot she will have very sharp pain  TODAY'S TREATMENT:                                                                                                                              DATE:   01/21/23 Nustep level 5 x 6 minutes Calf stretch HS stretch Red tband scapular rows Red tband shoulder extension Yellow tband shoulder ER Red tband shoulder IR STM to the right posterior and lateral upper arm Passive right shoulder stretches  01/12/23 Nustep level 5 x 6 minutes Calf stretch LE stretches passively, Right shoulder passive stretches Clamshells Ball b/n knees squeeze STM to the right upper arm and the buttock and low back  12/22/22 Gait around the back building nice pace, no rest Leg curls 20# 2x10 Leg extension 5# 2x10 Red tband hip extension and abduction Green tband clamshells Ball b/n knees squeeze Passive stretch LE's STM to the buttocks  12/17/22 Nustep level 5 x 5 minutes 20# leg curls 2x10 5# leg extension 20# leg press Passive stretch LE's all motions STM to the left thigh, buttock, worked with MR on the left adductor also did right thigh  12/07/22 Nustep level 5 x 5 minutes 2.5# marches, hip abduction and extension Leg press 20# 2x10 Passive HS and piriformis stretches Green tband clamshells Ball b/n knees  squeeze Feet on ball bridge Isometric abs with ball push STM to the left upper trap and neck and the left buttock  PATIENT EDUCATION:  Education details: POC/HEP Person educated: Patient Education method: Programmer, multimedia, Facilities manager, and Verbal cues Education comprehension: verbalized understanding  HOME EXERCISE PROGRAM: 11/18/22:  HS, calf, piriformis and adductor stretch  ASSESSMENT:  CLINICAL IMPRESSION: Patient is a 72 y.o. female who was seen today for physical therapy evaluation and treatment for back hip and neck pain She is overall doing very well, less hip and back pain for most of the time, she does have some right shoulder/upper arm pain lately.  She has good ROM but is  very tender in the posterior lateral upper arm, seems to be insertions, I added some exercises and stretches today.    OBJECTIVE IMPAIRMENTS: Abnormal gait, cardiopulmonary status limiting activity, decreased activity tolerance, decreased balance, decreased endurance, decreased mobility, difficulty walking, decreased ROM, decreased strength, dizziness, increased muscle spasms, impaired flexibility, improper body mechanics, postural dysfunction, and pain.   REHAB POTENTIAL: Good  CLINICAL DECISION MAKING: Stable/uncomplicated  EVALUATION COMPLEXITY: Low   GOALS: Goals reviewed with patient? Yes  SHORT TERM GOALS: Target date: 12/02/22  Independent with initial HEP Goal status: met 12/07/22  LONG TERM GOALS: Target date: 02/09/23  Independent with advanced HEP Goal status: progressing 01/21/23  2.  Decrease pain overall with ADL's 50% Goal status: progressing 01/21/23  3.  Increase lumbar ROM 25% Goal status: met 01/21/23  4.  Increase SLR to 65 degrees Goal status: met 01/21/23  5.  Walk 2 miles without an increase of pain Goal status: met 01/12/23  6.  Increase cervical ROM 25% Goal status: progressing 01/21/23  PLAN:  PT FREQUENCY: 1-2x/week  PT DURATION: 10 weeks  PLANNED  INTERVENTIONS: Therapeutic exercises, Therapeutic activity, Neuromuscular re-education, Balance training, Gait training, Patient/Family education, Self Care, Joint mobilization, Joint manipulation, Stair training, Dry Needling, Electrical stimulation, Spinal mobilization, Cryotherapy, Moist heat, Taping, Traction, Ultrasound, Ionotophoresis 4mg /ml Dexamethasone, and Manual therapy.  PLAN FOR NEXT SESSION: Patient is unsure if she can get back in to PT with holidays and a friend in hospice, if we do not get to see her she is to call and [place] discuss the possible continuation of PT, would need to ask for more visits   Jearld Lesch, PT 01/21/2023, 2:36 PM

## 2023-01-21 NOTE — Telephone Encounter (Signed)
LaVon please call this pt she had to reschedule from [place]. She needs something with her metformin and I did not understand what she was saying. She asked for your help.  Darel Hong [phone]

## 2023-01-21 NOTE — Addendum Note (Signed)
Addended by: Waymond Cera on: 01/21/2023 09:11 AM   Modules accepted: Orders

## 2023-01-22 DIAGNOSIS — L570 Actinic keratosis: Secondary | ICD-10-CM | POA: Diagnosis not present

## 2023-01-22 DIAGNOSIS — Z85828 Personal history of other malignant neoplasm of skin: Secondary | ICD-10-CM | POA: Diagnosis not present

## 2023-01-22 DIAGNOSIS — B009 Herpesviral infection, unspecified: Secondary | ICD-10-CM | POA: Diagnosis not present

## 2023-01-22 DIAGNOSIS — L821 Other seborrheic keratosis: Secondary | ICD-10-CM | POA: Diagnosis not present

## 2023-01-22 DIAGNOSIS — D225 Melanocytic nevi of trunk: Secondary | ICD-10-CM | POA: Diagnosis not present

## 2023-01-26 ENCOUNTER — Encounter: Payer: Self-pay | Admitting: Plastic Surgery

## 2023-01-26 ENCOUNTER — Ambulatory Visit: Payer: Self-pay | Admitting: Plastic Surgery

## 2023-01-26 DIAGNOSIS — Z719 Counseling, unspecified: Secondary | ICD-10-CM

## 2023-01-26 NOTE — Progress Notes (Signed)
   Subjective:    Patient ID: [name], female    DOB: [birth year], 72 y.o.   MRN: [id number]  HPI The patient is a 72 year old female here for evaluation of her face.  She is interested in facial rejuvenation.  She would like to try noninvasive procedures if possible and she does not want to do any Botox.  She may consider filler.  I think she would do well with filler in the midface.  This would help with the midface volume loss.   Review of Systems  Constitutional: Negative.   Eyes: Negative.   Respiratory: Negative.    Cardiovascular: Negative.   Gastrointestinal: Negative.   Endocrine: Negative.   Genitourinary: Negative.        Objective:   Physical Exam Constitutional:      Appearance: Normal appearance.  Cardiovascular:     Rate and Rhythm: Normal rate.  Skin:    General: Skin is warm.     Capillary Refill: Capillary refill takes less than 2 seconds.     Coloration: Skin is not jaundiced.     Findings: No bruising or lesion.  Neurological:     Mental Status: She is alert and oriented to person, place, and time.  Psychiatric:        Mood and Affect: Mood normal.        Behavior: Behavior normal.        Thought Content: Thought content normal.        Judgment: Judgment normal.        Assessment & Plan:  No diagnosis found.   The patient would do really well with 3 treatments of the BBL to help with the pigment and overall tone of her face and then I would recommend the halo.  Pictures were obtained of the patient and placed in the chart with the patient's or guardian's permission.

## 2023-01-27 ENCOUNTER — Encounter: Payer: Self-pay | Admitting: Physical Therapy

## 2023-01-27 ENCOUNTER — Ambulatory Visit: Payer: Medicare PPO | Admitting: Physical Therapy

## 2023-01-27 DIAGNOSIS — M5459 Other low back pain: Secondary | ICD-10-CM | POA: Diagnosis not present

## 2023-01-27 DIAGNOSIS — M25531 Pain in right wrist: Secondary | ICD-10-CM | POA: Diagnosis not present

## 2023-01-27 DIAGNOSIS — M25552 Pain in left hip: Secondary | ICD-10-CM

## 2023-01-27 DIAGNOSIS — M720 Palmar fascial fibromatosis [Dupuytren]: Secondary | ICD-10-CM | POA: Diagnosis not present

## 2023-01-27 DIAGNOSIS — M6281 Muscle weakness (generalized): Secondary | ICD-10-CM | POA: Diagnosis not present

## 2023-01-27 DIAGNOSIS — M542 Cervicalgia: Secondary | ICD-10-CM

## 2023-01-27 DIAGNOSIS — M7501 Adhesive capsulitis of right shoulder: Secondary | ICD-10-CM | POA: Diagnosis not present

## 2023-01-27 NOTE — Therapy (Signed)
OUTPATIENT PHYSICAL THERAPY THORACOLUMBAR TREATMENT   Patient Name: TALEIGH SEPE MRN: [id number] DOB:03-18-1950, 72 y.o., female Today's Date: 01/27/2023  END OF SESSION:  PT End of Session - 01/27/23 1356     Visit Number 7    Number of Visits 13    Date for PT Re-Evaluation 02/09/23    Authorization Type [employer] 6/10    Activity Tolerance Patient tolerated treatment well    Behavior During Therapy [facility] for tasks assessed/performed             Past Medical History:  Diagnosis Date   Anal fissure    Anemia    during pregnancy   Anxiety    Basal cell carcinoma    DDD (degenerative disc disease)    Diverticulosis    Hypokalemia    LBP (low back pain)    Melanoma (HCC)    Migraine    MVP (mitral valve prolapse) 1990   psa   Rosacea    Ulcer    as a child   Past Surgical History:  Procedure Laterality Date   ABDOMINAL HYSTERECTOMY  1985   BREAST BIOPSY Left    secondary to infection   I & D EXTREMITY Right 09/09/2012   Procedure: Minor IRRIGATION AND DEBRIDEMENT EXTREMITY paronychia of right thumb;  Surgeon: Nicki Reaper, MD;  Location: [facility];  Service: Orthopedics;  Laterality: Right;   I & D EXTREMITY Left 01/26/2014   Procedure: MINOR IINCISION AND DRAINAGE paronychia left index finger;  Surgeon: Cindee Salt, MD;  Location: [facility];  Service: Orthopedics;  Laterality: Left;  left index   OOPHORECTOMY Left 1994   REPAIR KNEE LIGAMENT Left 1989   TONSILLECTOMY  1955   TUBAL LIGATION     VEIN SURGERY     Patient Active Problem List   Diagnosis Date Noted   Pain of left hip 11/13/2022   Cervical strain 11/13/2022   Sciatica of right side 09/30/2022   B12 deficiency 09/30/2022   Anxiety 09/30/2022   Agatston coronary artery calcium score between 200 and 399 08/03/2022   Osteopenia of necks of both femurs 06/23/2022   Dupuytren's contracture of both hands 06/23/2022   Low TSH level 05/20/2022   Prediabetes 05/20/2022    Cellulitis of finger of right hand 09/08/2021   Infection of skin 05/13/2021   Cervicalgia 05/06/2021   Grieving 05/06/2021   Conductive hearing loss of right ear 03/04/2021   Impacted cerumen of right ear 03/04/2021   Medication side effect 03/03/2021   Excessive cerumen in right ear canal 03/03/2021   Labyrinthitis 07/30/2020   Palpitations 04/15/2020   Plantar fasciitis 04/15/2020   Dysthymia 04/15/2020   Vitamin D deficiency 04/15/2020   Gastroesophageal reflux disease 04/15/2020   Insect bite of left foot 11/24/2019   Pustule 11/24/2019   Pain of right thumb 03/09/2019   External hemorrhoids 03/03/2018   Other chest pain 02/23/2018   Insomnia 02/24/2017   Healthcare maintenance 02/24/2017   Conjunctivitis 03/01/2014   Seborrheic keratosis, inflamed 01/05/2012   Menopausal syndrome 12/22/2011   Encounter for counseling 11/11/2011   Skin cancer 07/14/2011   Basal cell carcinoma of face 03/10/2011   Migraine 05/14/2007   IRRITABLE BOWEL SYNDROME 05/14/2007   History of mitral valve prolapse 05/14/2007   Allergic rhinitis 04/07/2007   DERMATITIS DUE TO SOLAR RADIATION 04/07/2007   Premature beat 09/20/2006   DIVERTICULOSIS, COLON 04/13/2002    PCP: Doreene Burke, MD  REFERRING PROVIDER: Doreene Burke, MD  REFERRING DIAG: back,  hip and neck pain  Rationale for Evaluation and Treatment: Rehabilitation  THERAPY DIAG:  Other low back pain  Cervicalgia  Pain in left hip  Muscle weakness (generalized)  ONSET DATE: 11/13/2022  SUBJECTIVE:                                                                                                                                                                                           SUBJECTIVE STATEMENT: Still with the right biceps issue, reports that vacuuming caused some increase of pain yesterday, the hip is great  PERTINENT HISTORY:  See chart  PAIN:  Are you having pain? Yes: NPRS scale: 4/10 Pain location: left buttock, also  neck pain Pain description: sharp, tight, sore Aggravating factors: sometimes just happens, twist pain up to 10/10 Relieving factors: stretching at best pain can be 0/10   PRECAUTIONS: None  RED FLAGS: None   WEIGHT BEARING RESTRICTIONS: No  FALLS:  Has patient fallen in last 6 months? Yes. Number of falls 1 while playing pickle ball  LIVING ENVIRONMENT: Lives with: lives with their family and lives with their spouse Lives in: House/apartment Stairs: No Has following equipment at home: None  OCCUPATION: retired  PLOF: Independent and walks, pickle ball, travels  PATIENT GOALS: have less pain, move better, be more flexible  NEXT MD VISIT: none  OBJECTIVE:  Note: Objective measures were completed at Evaluation unless otherwise noted.  DIAGNOSTIC FINDINGS:  Recent x-rays but not read by the radiologist  PATIENT SURVEYS:  FOTO 49  COGNITION: Overall cognitive status: Within functional limits for tasks assessed     SENSATION: WFL  MUSCLE LENGTH: SLR 40 degrees pain  bilateral, very tight in the piriformis, extremely tight in the calves and the adductors,   POSTURE:  good sitting posture  PALPATION: Very tender in the left buttock, mild tenderness in the left low back , significant tenderness in the neck especially the left  LUMBAR ROM:   AROM eval  Flexion Decreased 50%  Extension Decreased 50%  Right lateral flexion 50%  Left lateral flexion 50%  Right rotation   Left rotation    (Blank rows = not tested)  CERVICAL ROM:  decreased 25% with pain at end range   LOWER EXTREMITY ROM:     Active  Right eval Left eval  Hip flexion    Hip extension    Hip abduction    Hip adduction    Hip internal rotation    Hip external rotation    Knee flexion    Knee extension    Ankle dorsiflexion    Ankle plantarflexion    Ankle inversion  Ankle eversion     (Blank rows = not tested)  LOWER EXTREMITY MMT:    MMT Right eval Left eval  Hip flexion   3+  Hip extension  4  Hip abduction  3+  Hip adduction    Hip internal rotation    Hip external rotation    Knee flexion    Knee extension    Ankle dorsiflexion    Ankle plantarflexion    Ankle inversion    Ankle eversion     (Blank rows = not tested)  LUMBAR SPECIAL TESTS:  Straight leg raise test: Positive, Slump test: Positive, and Thomas test: Positive  GAIT: Distance walked: 60 feet Assistive device utilized: None Level of assistance: Complete Independence Comments: mild antalgic gait on the right, reports that at times if she does a pivot she will have very sharp pain  TODAY'S TREATMENT:                                                                                                                              DATE:   01/27/23 Nustep level 4 x 5 minutes Gave new HEP and performed Yellow tband ER/IR Scapular stability red tband STM to the right biceps area Ionto 80mA dose right biceps Discussed and demo if correct body mechanics for vacuum to decrease stress on the back and the shoulder  01/21/23 Nustep level 5 x 6 minutes Calf stretch HS stretch Red tband scapular rows Red tband shoulder extension Yellow tband shoulder ER Red tband shoulder IR STM to the right posterior and lateral upper arm Passive right shoulder stretches  01/12/23 Nustep level 5 x 6 minutes Calf stretch LE stretches passively, Right shoulder passive stretches Clamshells Ball b/n knees squeeze STM to the right upper arm and the buttock and low back  12/22/22 Gait around the back building nice pace, no rest Leg curls 20# 2x10 Leg extension 5# 2x10 Red tband hip extension and abduction Green tband clamshells Ball b/n knees squeeze Passive stretch LE's STM to the buttocks  12/17/22 Nustep level 5 x 5 minutes 20# leg curls 2x10 5# leg extension 20# leg press Passive stretch LE's all motions STM to the left thigh, buttock, worked with MR on the left adductor also did right  thigh  12/07/22 Nustep level 5 x 5 minutes 2.5# marches, hip abduction and extension Leg press 20# 2x10 Passive HS and piriformis stretches Green tband clamshells Ball b/n knees squeeze Feet on ball bridge Isometric abs with ball push STM to the left upper trap and neck and the left buttock  PATIENT EDUCATION:  Education details: POC/HEP Person educated: Patient Education method: Programmer, multimedia, Demonstration, and Verbal cues Education comprehension: verbalized understanding  HOME EXERCISE PROGRAM: 11/18/22:  HS, calf, piriformis and adductor strAccess Code: [email or web address] URL: [email or web address] Date: 01/27/2023 Prepared by: Stacie Glaze  Exercises - Doorway Pec Stretch at 90 Degrees Abduction  - 1 x daily - 7 x weekly - 1 sets - 5 reps -  10 hold - Doorway Pec Stretch at 60 Degrees Abduction with Arm Straight  - 1 x daily - 7 x weekly - 1 sets - 5 reps - 10 holdetch   ASSESSMENT:  CLINICAL IMPRESSION: Patient is a 72 y.o. female who was seen today for physical therapy evaluation and treatment for back hip and neck pain She is overall doing very well, less hip and back pain, she does have some right shoulder/upper arm pain lately.  Worse with vacuum, demo today of how to do without stress on the shoulder, gave new exercises, goals met will d/c  OBJECTIVE IMPAIRMENTS: Abnormal gait, cardiopulmonary status limiting activity, decreased activity tolerance, decreased balance, decreased endurance, decreased mobility, difficulty walking, decreased ROM, decreased strength, dizziness, increased muscle spasms, impaired flexibility, improper body mechanics, postural dysfunction, and pain.   REHAB POTENTIAL: Good  CLINICAL DECISION MAKING: Stable/uncomplicated  EVALUATION COMPLEXITY: Low   GOALS: Goals reviewed with patient? Yes  SHORT TERM GOALS: Target date: 12/02/22  Independent with initial HEP Goal status: met 12/07/22  LONG TERM GOALS: Target date:  02/09/23  Independent with advanced HEP Goal status: met 01/27/23  2.  Decrease pain overall with ADL's 50% Goal status: met 01/27/23  3.  Increase lumbar ROM 25% Goal status: met 01/21/23  4.  Increase SLR to 65 degrees Goal status: met 01/21/23  5.  Walk 2 miles without an increase of pain Goal status: met 01/12/23  6.  Increase cervical ROM 25% Goal status: met 01/27/23  PLAN:  PT FREQUENCY: 1-2x/week  PT DURATION: 10 weeks  PLANNED INTERVENTIONS: Therapeutic exercises, Therapeutic activity, Neuromuscular re-education, Balance training, Gait training, Patient/Family education, Self Care, Joint mobilization, Joint manipulation, Stair training, Dry Needling, Electrical stimulation, Spinal mobilization, Cryotherapy, Moist heat, Taping, Traction, Ultrasound, Ionotophoresis 4mg /ml Dexamethasone, and Manual therapy.  PLAN FOR NEXT SESSION: goals met will D/C  Jearld Lesch, PT 01/27/2023, 1:56 PM

## 2023-02-01 ENCOUNTER — Encounter: Payer: Self-pay | Admitting: Family Medicine

## 2023-02-01 ENCOUNTER — Ambulatory Visit: Payer: Medicare PPO | Admitting: Family Medicine

## 2023-02-01 VITALS — BP 114/60 | HR 79 | Temp 97.3°F | Ht 61.0 in | Wt 114.0 lb

## 2023-02-01 DIAGNOSIS — R931 Abnormal findings on diagnostic imaging of heart and coronary circulation: Secondary | ICD-10-CM

## 2023-02-01 DIAGNOSIS — R7303 Prediabetes: Secondary | ICD-10-CM | POA: Diagnosis not present

## 2023-02-01 NOTE — Progress Notes (Signed)
Established Patient Office Visit   Subjective:  Patient ID: [name], female    DOB: 04-Nov-1950  Age: 72 y.o. MRN: [id number]  No chief complaint on file.   HPI Encounter Diagnoses  Name Primary?   Prediabetes Yes   Elevated coronary artery calcium score    For follow-up of prediabetes and for follow-up of an elevated coronary artery calcium score with a favorable lipid profile.  He has been compliant with the metformin 500 at bedtime long-term rosuvastatin 10 mg daily.  She is having no issues taking these medications.   Review of Systems  Constitutional: Negative.   HENT: Negative.    Eyes:  Negative for blurred vision, discharge and redness.  Respiratory: Negative.    Cardiovascular: Negative.   Gastrointestinal:  Negative for abdominal pain.  Genitourinary: Negative.   Musculoskeletal: Negative.  Negative for myalgias.  Skin:  Negative for rash.  Neurological:  Negative for tingling, loss of consciousness and weakness.  Endo/Heme/Allergies:  Negative for polydipsia.     Current Outpatient Medications:    Ascorbic Acid (VITAMIN C) 1000 MG tablet, , Disp: , Rfl:    atenolol (TENORMIN) 25 MG tablet, TAKE 1/2 TABLET(12.5 MG) BY MOUTH DAILY AS NEEDED FOR PALPITATIONS, Disp: 90 tablet, Rfl: 3   calcium-vitamin D 250-100 MG-UNIT per tablet, Take 1 tablet by mouth daily., Disp: , Rfl:    Coenzyme Q10 (CO Q10) 100 MG CAPS, Take by mouth daily., Disp: , Rfl:    Doxylamine Succinate, Sleep, (SLEEP-AID PO), Take by mouth. [employer] sleep aid, Disp: , Rfl:    estradiol (ESTRACE) 0.5 MG tablet, Take 0.5 mg by mouth daily., Disp: , Rfl:    hydrocortisone (ANUSOL-HC) 2.5 % rectal cream, Place 1 Application rectally 2 (two) times daily. (Patient taking differently: Place 1 Application rectally 2 (two) times daily. As needed), Disp: 30 g, Rfl: 0   L-LYSINE PO, Take 1 tablet by mouth 2 (two) times a week., Disp: , Rfl:    meclizine (ANTIVERT) 12.5 MG tablet, meclizine 12.5 mg tablet  one  tablet every 6-12 hours as needed for dizziness, Disp: 30 tablet, Rfl: 1   meloxicam (MOBIC) 15 MG tablet, TAKE 1 TABLET BY MOUTH AS NEEDED, Disp: 30 tablet, Rfl: 1   metFORMIN (GLUCOPHAGE-XR) 500 MG 24 hr tablet, Take 1 tablet (500 mg total) by mouth at bedtime., Disp: 90 tablet, Rfl: 0   Multiple Vitamin (MULTIVITAMIN) tablet, Take 1 tablet by mouth daily., Disp: , Rfl:    omeprazole (PRILOSEC) 20 MG capsule, Take 20 mg by mouth daily., Disp: , Rfl:    Phenyleph-Diphenhyd-Hydrocod (HYDRO-DP PO), Take by mouth. Hydroeye, Disp: , Rfl:    RESTASIS 0.05 % ophthalmic emulsion, , Disp: , Rfl:    SAMBUCOL BLACK ELDERBERRY PO, Take 1 capsule by mouth 2 (two) times a week., Disp: , Rfl:    zolmitriptan (ZOMIG-ZMT) 2.5 MG disintegrating tablet, TAKE 1 TABLET BY MOUTH AS NEEDED FOR MIGRAINE, Disp: 10 tablet, Rfl: 4   rosuvastatin (CRESTOR) 10 MG tablet, Take 1 tablet (10 mg total) by mouth daily., Disp: 90 tablet, Rfl: 3   Objective:     BP 114/60   Pulse 79   Temp (!) 97.3 F (36.3 C)   Ht 5\' 1"  (1.549 m)   Wt 114 lb (51.7 kg)   SpO2 97%   BMI 21.54 kg/m  Wt Readings from Last 3 Encounters:  02/01/23 114 lb (51.7 kg)  11/13/22 114 lb 12.8 oz (52.1 kg)  10/23/22 114 lb (51.7 kg)  Physical Exam Constitutional:      General: She is not in acute distress.    Appearance: Normal appearance. She is not ill-appearing, toxic-appearing or diaphoretic.  HENT:     Head: Normocephalic and atraumatic.     Right Ear: External ear normal.     Left Ear: External ear normal.  Eyes:     General: No scleral icterus.       Right eye: No discharge.        Left eye: No discharge.     Extraocular Movements: Extraocular movements intact.     Conjunctiva/sclera: Conjunctivae normal.  Pulmonary:     Effort: Pulmonary effort is normal. No respiratory distress.  Skin:    General: Skin is warm and dry.  Neurological:     Mental Status: She is alert and oriented to person, place, and time.   Psychiatric:        Mood and Affect: Mood normal.        Behavior: Behavior normal.      No results found for any visits on 02/01/23.    The 10-year ASCVD risk score (Arnett DK, et al., 2019) is: 11.1%    Assessment & Plan:   Prediabetes -     Comprehensive metabolic panel; Future -     Hemoglobin A1c; Future  Elevated coronary artery calcium score -     Lipid panel; Future -     Comprehensive metabolic panel; Future    Return for annual physical.  Will return to fasting for above ordered blood work and then for a physical in March or April.   Mliss Sax, MD

## 2023-02-15 ENCOUNTER — Ambulatory Visit: Payer: Self-pay

## 2023-02-18 ENCOUNTER — Other Ambulatory Visit: Payer: Medicare PPO

## 2023-02-19 ENCOUNTER — Other Ambulatory Visit: Payer: Medicare PPO

## 2023-02-19 ENCOUNTER — Other Ambulatory Visit (INDEPENDENT_AMBULATORY_CARE_PROVIDER_SITE_OTHER): Payer: Medicare PPO

## 2023-02-19 ENCOUNTER — Telehealth: Payer: Self-pay | Admitting: Plastic Surgery

## 2023-02-19 DIAGNOSIS — R7303 Prediabetes: Secondary | ICD-10-CM | POA: Diagnosis not present

## 2023-02-19 DIAGNOSIS — R931 Abnormal findings on diagnostic imaging of heart and coronary circulation: Secondary | ICD-10-CM

## 2023-02-19 LAB — COMPREHENSIVE METABOLIC PANEL
ALT: 22 U/L (ref 0–35)
AST: 23 U/L (ref 0–37)
Albumin: 4.6 g/dL (ref 3.5–5.2)
Alkaline Phosphatase: 66 U/L (ref 39–117)
BUN: 17 mg/dL (ref 6–23)
CO2: 31 meq/L (ref 19–32)
Calcium: 10.3 mg/dL (ref 8.4–10.5)
Chloride: 100 meq/L (ref 96–112)
Creatinine, Ser: 0.64 mg/dL (ref 0.40–1.20)
GFR: 87.05 mL/min (ref >60.00)
Glucose, Bld: 93 mg/dL (ref 70–99)
Potassium: 3.6 meq/L (ref 3.5–5.1)
Sodium: 139 meq/L (ref 135–145)
Total Bilirubin: 0.6 mg/dL (ref 0.2–1.2)
Total Protein: 7.6 g/dL (ref 6.0–8.3)

## 2023-02-19 LAB — LIPID PANEL
Cholesterol: 152 mg/dL (ref 0–200)
HDL: 73.2 mg/dL (ref >39.00)
LDL Cholesterol: 58 mg/dL (ref 0–99)
NonHDL: 79.05
Total CHOL/HDL Ratio: 2
Triglycerides: 107 mg/dL (ref 0.0–149.0)
VLDL: 21.4 mg/dL (ref 0.0–40.0)

## 2023-02-19 LAB — HEMOGLOBIN A1C: Hgb A1c MFr Bld: 6.1 % (ref 4.6–6.5)

## 2023-02-19 NOTE — Telephone Encounter (Signed)
 I called the patient and answered questions she had regarding her upcoming laser treatment on Tuesday 1/14.

## 2023-02-19 NOTE — Telephone Encounter (Signed)
 Patient would like to speak to someone on clinical staff, regarding her appointment on Tuesday, just wants to know more about procedure being down, please Advise

## 2023-02-23 ENCOUNTER — Ambulatory Visit (INDEPENDENT_AMBULATORY_CARE_PROVIDER_SITE_OTHER): Payer: Self-pay | Admitting: Plastic Surgery

## 2023-02-23 VITALS — BP 135/76 | HR 62

## 2023-02-23 DIAGNOSIS — Z719 Counseling, unspecified: Secondary | ICD-10-CM

## 2023-02-23 NOTE — Progress Notes (Signed)
 Preoperative Dx: Hyperpigmentation of face chest and neck  Postoperative Dx:  same  Procedure: laser to face chest and neck  Anesthesia: none  Description of Procedure:  Risks and complications were explained to the patient. Consent was confirmed and signed. Eye protection was placed. Time out was called and all information was confirmed to be correct. The area  area was prepped with alcohol and wiped dry. The heroic laser was set at 532 nm and 2.0 J/cm2. The face chest and neck were lasered. The patient tolerated the procedure well and there were no complications. The patient is to follow up in 4 weeks.

## 2023-02-24 DIAGNOSIS — H6121 Impacted cerumen, right ear: Secondary | ICD-10-CM | POA: Diagnosis not present

## 2023-02-24 DIAGNOSIS — H9011 Conductive hearing loss, unilateral, right ear, with unrestricted hearing on the contralateral side: Secondary | ICD-10-CM | POA: Diagnosis not present

## 2023-02-25 ENCOUNTER — Encounter: Payer: Self-pay | Admitting: Cardiovascular Disease

## 2023-02-25 ENCOUNTER — Telehealth: Payer: Self-pay | Admitting: Cardiovascular Disease

## 2023-02-25 NOTE — Telephone Encounter (Signed)
Pt calling to make provider aware that lab results from PCP can now be seen. Pt would like a c/b as to what provider recommends. Please advise

## 2023-02-25 NOTE — Telephone Encounter (Unsigned)
Copied from CRM [id number]. Topic: General - Other >> Feb 25, 2023  9:18 AM Larwance Sachs wrote: Reason for CRM: Patient called in regarding needing recent lab work sent over to cardiology office she was referred to

## 2023-02-25 NOTE — Telephone Encounter (Signed)
Patient calling to inform Dr Royann Shivers her labs drawn by PCP can be seen.

## 2023-02-26 ENCOUNTER — Ambulatory Visit (INDEPENDENT_AMBULATORY_CARE_PROVIDER_SITE_OTHER): Payer: Medicare PPO | Admitting: Family Medicine

## 2023-02-26 ENCOUNTER — Ambulatory Visit: Payer: Self-pay | Admitting: Family Medicine

## 2023-02-26 ENCOUNTER — Encounter: Payer: Self-pay | Admitting: Family Medicine

## 2023-02-26 VITALS — BP 124/63 | HR 97 | Temp 99.4°F | Resp 12 | Ht 61.5 in | Wt 115.0 lb

## 2023-02-26 DIAGNOSIS — R110 Nausea: Secondary | ICD-10-CM | POA: Diagnosis not present

## 2023-02-26 DIAGNOSIS — J069 Acute upper respiratory infection, unspecified: Secondary | ICD-10-CM | POA: Insufficient documentation

## 2023-02-26 LAB — POC COVID19 BINAXNOW: SARS Coronavirus 2 Ag: NEGATIVE

## 2023-02-26 LAB — POCT RAPID STREP A (OFFICE): Rapid Strep A Screen: NEGATIVE

## 2023-02-26 NOTE — Assessment & Plan Note (Addendum)
Nasal congestion,  body aches, and sore throat  since last night. No fever. No shortness of breath. Lungs are clear in office.  Oxygen saturation 98%. Vital signs are stable. Needs to increase fluids.  Rapid Covid negative. Rapid strep negative. Unable to get rapid flu today due to sample collection issue. Supportive therapy, immune support as discussed. She will return early next week if symptoms do not improve or get worse.

## 2023-02-26 NOTE — Telephone Encounter (Signed)
   Chief Complaint: body aches/chills Symptoms: sore throat, nasal drainage, cotton feeling in mouth when eating  Frequency: comes and goes  Disposition: [] ED /[] Urgent Care (no appt availability in office) / [x] Appointment(In office/virtual)/ []  [place] [facility]/ [] Home Care/ [] Refused Recommended Disposition /[] [place] [facility]/ []  Follow-up with PCP Additional Notes: Pt complaining of sore throat, body chills/aches, nasal drainage that started last night. Pt denies fever. Temp was 98.7. Per protocol, pt to be seen today. Pt seeing another provider but within [facility] system. RN gave care advice and pt verbalized understanding.         Copied from CRM [id number]. Topic: Clinical - Red Word Triage >> Feb 26, 2023  9:25 AM Deaijah H wrote: Red Word that prompted transfer to Nurse Triage: Patient called in wanting to know if a nurse can give a call due to nasal drainage, aching all over(gets extreme then calms down) , sore throat, and cotton feeling when eating and chills Reason for Disposition  [1] MODERATE pain (e.g., interferes with normal activities) AND [2] present > 3 days  Answer Assessment - Initial Assessment Questions 1. ONSET: "When did the muscle aches or body pains start?"      Last night 2. LOCATION: "What part of your body is hurting?" (e.g., entire body, arms, legs)      Whole body 3. SEVERITY: "How bad is the pain?" (Scale 1-10; or mild, moderate, severe)   - MILD (1-3): doesn't interfere with normal activities    - MODERATE (4-7): interferes with normal activities or awakens from sleep    - SEVERE (8-10):  excruciating pain, unable to do any normal activities      4-sore throat 4. CAUSE: "What do you think is causing the pains?"     unsure 5. FEVER: "Have you been having fever?"     denies 6. OTHER SYMPTOMS: "Do you have any other symptoms?" (e.g., chest pain, weakness, rash, cold or flu symptoms, weight loss)     Sore throat, nasal drainage, body aches  8.  TRAVEL: "Have you traveled out of the country in the last month?" (e.g., travel history, exposures)     no  Protocols used: Muscle Aches and Body Pain-A-AH

## 2023-02-26 NOTE — Patient Instructions (Addendum)
Viral illnesses can take up to 10 days to resolve.   There is no role for an antibiotic.  Symptomatic treatment is ideal.   Take over the counter pain medication as needed. Acetaminophen and ibuprofen for fever and body aches. Mucinex and Robitussin for cough, if you have high blood pressure, take Coricidin for cough. Honey is also effective for cough, avoid if diabetic. Flonase or saline nasal spray for nasal congestion.  Saline spray helps with nasal congestion.    Read and follow instructions on the label and make sure not to combine other medications that may have same ingredients in it. It is important to not take too much.    Drink plenty of caffeine-free fluids. (If you have heart or kidney problems, follow the instructions of your specialist regarding amounts). Adequate fluids will help you to avoid dehydration.   If you are hungry, eat a bland diet, such as the BRAT diet (bananas, rice, applesauce, toast). Diet as tolerated if appetite is normal.   Get lots of rest.  Let us know if you are not improving or getting worse.    I recommend taking something like Airborne that has vitamin C and zinc to support your immune system. Elderberry is also helpful for immune support.

## 2023-02-26 NOTE — Progress Notes (Signed)
Acute Office Visit  Subjective:     Patient ID: [name], female    DOB: 1950-08-15, 73 y.o.   MRN: [id number]  Chief Complaint  Patient presents with   Chills   Generalized Body Aches   Nasal Congestion     Patient states this all began last night. Patient states no color to the mucus yet. Patient states she has not run a fever at home.     HPI Patient is in today for nasal congestion, chills, and body aches. Symptoms present since last night. Throat feels like "its on fire"  Rapid Covid negative. Rapid flu:  Rapid strep:  No fever. No chest pain or shortness of breath.  No nasal drainage.  No sick contacts.    Review of Systems  Constitutional:  Positive for chills and malaise/fatigue. Negative for fever.  HENT:  Positive for congestion and sore throat.   Respiratory:  Negative for cough, shortness of breath and wheezing.   Gastrointestinal:  Positive for nausea. Negative for vomiting.  Neurological:  Positive for headaches. Negative for dizziness and weakness.        Objective:    BP 124/63 (BP Location: Left Arm, Patient Position: Sitting)   Pulse 97   Temp 99.4 F (37.4 C) (Oral)   Resp 12   Ht 5' 1.5" (1.562 m)   Wt 115 lb (52.2 kg)   SpO2 98%   BMI 21.38 kg/m  BP Readings from Last 3 Encounters:  02/26/23 124/63  02/23/23 135/76  02/01/23 114/60      Physical Exam Vitals and nursing note reviewed.  Constitutional:      General: She is not in acute distress.    Appearance: Normal appearance. She is normal weight. She is ill-appearing.  HENT:     Right Ear: Tympanic membrane normal.     Left Ear: Tympanic membrane normal.     Nose: Nose normal.     Right Sinus: No maxillary sinus tenderness or frontal sinus tenderness.     Left Sinus: No maxillary sinus tenderness or frontal sinus tenderness.     Mouth/Throat:     Pharynx: Uvula midline. Posterior oropharyngeal erythema present. No oropharyngeal exudate.  Cardiovascular:     Rate and  Rhythm: Regular rhythm.     Heart sounds: Normal heart sounds.  Pulmonary:     Effort: Pulmonary effort is normal.     Breath sounds: Normal breath sounds.  Skin:    General: Skin is warm and dry.  Neurological:     General: No focal deficit present.     Mental Status: She is alert. Mental status is at baseline.  Psychiatric:        Mood and Affect: Mood normal.        Behavior: Behavior normal.        Thought Content: Thought content normal.        Judgment: Judgment normal.    Results for orders placed or performed in visit on 02/26/23  POC COVID-19 BinaxNow  Result Value Ref Range   SARS Coronavirus 2 Ag Negative Negative  POCT rapid strep A  Result Value Ref Range   Rapid Strep A Screen Negative Negative  Unable to get rapid flu due to sample.       Assessment & Plan:   Problem List Items Addressed This Visit     Upper respiratory tract infection - Primary   Nasal congestion,  body aches, and sore throat  since last night. No fever. No  shortness of breath. Lungs are clear in office.  Oxygen saturation 98%. Vital signs are stable. Needs to increase fluids.  Rapid Covid negative. Rapid strep negative. Unable to get rapid flu today due to sample collection issue. Supportive therapy, immune support as discussed. She will return early next week if symptoms do not improve or get worse.        Relevant Orders   POC COVID-19 BinaxNow (Completed)   POCT Influenza A/B   POCT rapid strep A (Completed)   Nausea  Unable to prescribe mediation for nausea due to multiple allergies.  No vomiting. Agrees with plan of care discussed.  Questions answered.      Return in about 4 days (around 03/02/2023) for wants to schedule for 1050 for recheck .  Novella Olive, FNP

## 2023-03-02 ENCOUNTER — Ambulatory Visit: Payer: Medicare PPO | Admitting: Family Medicine

## 2023-03-17 DIAGNOSIS — N9089 Other specified noninflammatory disorders of vulva and perineum: Secondary | ICD-10-CM | POA: Diagnosis not present

## 2023-03-18 ENCOUNTER — Encounter: Payer: Self-pay | Admitting: Family Medicine

## 2023-03-18 ENCOUNTER — Ambulatory Visit: Payer: Medicare PPO | Admitting: Family Medicine

## 2023-03-18 VITALS — BP 118/82 | HR 88 | Temp 97.1°F | Ht 61.0 in | Wt 116.8 lb

## 2023-03-18 DIAGNOSIS — R058 Other specified cough: Secondary | ICD-10-CM | POA: Diagnosis not present

## 2023-03-18 NOTE — Progress Notes (Signed)
 Established Patient Office Visit   Subjective:  Patient ID: [name], female    DOB: [birth year]  Age: 73 y.o. MRN: [id number]  Chief Complaint  Patient presents with   Cough    Cough x 2 weeks. Pt states the cough is still lingering and have right ear pain.     Cough Pertinent negatives include no eye redness, myalgias, rash, shortness of breath or wheezing.   Encounter Diagnoses  Name Primary?   Post-viral cough syndrome Yes   Ongoing dry cough after recent viral syndrome.  There is been no fevers chills, sputum production, wheezing or difficulty breathing.  No history of asthma or tobacco use.  No postnasal drip.   Review of Systems  Constitutional: Negative.   HENT: Negative.    Eyes:  Negative for blurred vision, discharge and redness.  Respiratory:  Positive for cough. Negative for sputum production, shortness of breath and wheezing.   Cardiovascular: Negative.   Gastrointestinal:  Negative for abdominal pain.  Genitourinary: Negative.   Musculoskeletal: Negative.  Negative for myalgias.  Skin:  Negative for rash.  Neurological:  Negative for tingling, loss of consciousness and weakness.  Endo/Heme/Allergies:  Negative for polydipsia.     Current Outpatient Medications:    Ascorbic Acid (VITAMIN C) 1000 MG tablet, , Disp: , Rfl:    atenolol  (TENORMIN ) 25 MG tablet, TAKE 1/2 TABLET(12.5 MG) BY MOUTH DAILY AS NEEDED FOR PALPITATIONS, Disp: 90 tablet, Rfl: 3   calcium -vitamin D  250-100 MG-UNIT per tablet, Take 1 tablet by mouth daily., Disp: , Rfl:    Coenzyme Q10 (CO Q10) 100 MG CAPS, Take by mouth daily., Disp: , Rfl:    Doxylamine Succinate, Sleep, (SLEEP-AID PO), Take by mouth. [employer] sleep aid, Disp: , Rfl:    estradiol (ESTRACE) 0.5 MG tablet, Take 0.5 mg by mouth daily., Disp: , Rfl:    hydrocortisone  (ANUSOL -HC) 2.5 % rectal cream, Place 1 Application rectally 2 (two) times daily., Disp: 30 g, Rfl: 0   L-LYSINE PO, Take 1 tablet by mouth 2 (two) times a  week., Disp: , Rfl:    meclizine  (ANTIVERT ) 12.5 MG tablet, meclizine  12.5 mg tablet  one tablet every 6-12 hours as needed for dizziness, Disp: 30 tablet, Rfl: 1   meloxicam  (MOBIC ) 15 MG tablet, TAKE 1 TABLET BY MOUTH AS NEEDED, Disp: 30 tablet, Rfl: 1   metFORMIN  (GLUCOPHAGE -XR) 500 MG 24 hr tablet, Take 1 tablet (500 mg total) by mouth at bedtime., Disp: 90 tablet, Rfl: 0   Multiple Vitamin (MULTIVITAMIN) tablet, Take 1 tablet by mouth daily., Disp: , Rfl:    omeprazole  (PRILOSEC) 20 MG capsule, Take 20 mg by mouth daily., Disp: , Rfl:    Phenyleph-Diphenhyd-Hydrocod (HYDRO-DP PO), Take by mouth. Hydroeye, Disp: , Rfl:    RESTASIS 0.05 % ophthalmic emulsion, , Disp: , Rfl:    SAMBUCOL BLACK ELDERBERRY PO, Take 1 capsule by mouth 2 (two) times a week., Disp: , Rfl:    zolmitriptan  (ZOMIG -ZMT) 2.5 MG disintegrating tablet, TAKE 1 TABLET BY MOUTH AS NEEDED FOR MIGRAINE, Disp: 10 tablet, Rfl: 4   rosuvastatin  (CRESTOR ) 10 MG tablet, Take 1 tablet (10 mg total) by mouth daily., Disp: 90 tablet, Rfl: 3   Objective:     BP 118/82   Pulse 88   Temp (!) 97.1 F (36.2 C)   Ht 5' 1 (1.549 m)   Wt 116 lb 12.8 oz (53 kg)   SpO2 98%   BMI 22.07 kg/m    Physical Exam  Constitutional:      General: She is not in acute distress.    Appearance: Normal appearance. She is not ill-appearing, toxic-appearing or diaphoretic.  HENT:     Head: Normocephalic and atraumatic.     Right Ear: External ear normal.     Left Ear: External ear normal.     Mouth/Throat:     Mouth: Mucous membranes are moist.     Pharynx: Oropharynx is clear. No oropharyngeal exudate or posterior oropharyngeal erythema.  Eyes:     General: No scleral icterus.       Right eye: No discharge.        Left eye: No discharge.     Extraocular Movements: Extraocular movements intact.     Conjunctiva/sclera: Conjunctivae normal.     Pupils: Pupils are equal, round, and reactive to light.  Cardiovascular:     Rate and Rhythm:  Normal rate and regular rhythm.  Pulmonary:     Effort: Pulmonary effort is normal. No respiratory distress.     Breath sounds: Normal breath sounds. No wheezing or rales.  Abdominal:     General: Bowel sounds are normal.     Tenderness: There is no abdominal tenderness. There is no guarding.  Musculoskeletal:     Cervical back: No rigidity or tenderness.  Skin:    General: Skin is warm and dry.  Neurological:     Mental Status: She is alert and oriented to person, place, and time.  Psychiatric:        Mood and Affect: Mood normal.        Behavior: Behavior normal.      No results found for any visits on 03/18/23.    The 10-year ASCVD risk score (Arnett DK, et al., 2019) is: 15.7%    Assessment & Plan:   Post-viral cough syndrome    Return if symptoms worsen or fail to improve.  Offered low-dose prednisone but would like to avoid out of fear of its association with palpitations.  She will continue Tessalon .  Follow-up with worsening signs and symptoms.  Elsie Sim Lent, MD

## 2023-03-19 ENCOUNTER — Ambulatory Visit: Payer: Medicare PPO | Admitting: Family Medicine

## 2023-04-02 ENCOUNTER — Ambulatory Visit: Payer: Medicare PPO | Admitting: Family Medicine

## 2023-04-05 ENCOUNTER — Telehealth: Payer: Self-pay | Admitting: Plastic Surgery

## 2023-04-05 NOTE — Telephone Encounter (Signed)
 Patient has a question about her laser, she would like

## 2023-04-06 ENCOUNTER — Telehealth: Payer: Self-pay | Admitting: Plastic Surgery

## 2023-04-06 ENCOUNTER — Other Ambulatory Visit: Payer: Self-pay | Admitting: Family Medicine

## 2023-04-06 ENCOUNTER — Other Ambulatory Visit: Payer: Self-pay | Admitting: Plastic Surgery

## 2023-04-06 NOTE — Telephone Encounter (Signed)
 called lvm, Dr D in sx, apt needs to be moved to friday, Hold until end of day, can r/s to friday when pt calls back/

## 2023-04-07 ENCOUNTER — Telehealth: Payer: Self-pay | Admitting: Plastic Surgery

## 2023-04-07 ENCOUNTER — Other Ambulatory Visit: Payer: Self-pay | Admitting: Plastic Surgery

## 2023-04-07 NOTE — Telephone Encounter (Signed)
 called 3x and had to lvmails, when pt calls back we can r/s to friday or a different day

## 2023-04-13 ENCOUNTER — Other Ambulatory Visit: Payer: Self-pay | Admitting: Family Medicine

## 2023-04-13 ENCOUNTER — Other Ambulatory Visit: Payer: Self-pay

## 2023-04-13 ENCOUNTER — Ambulatory Visit (INDEPENDENT_AMBULATORY_CARE_PROVIDER_SITE_OTHER): Payer: Self-pay | Admitting: Plastic Surgery

## 2023-04-13 DIAGNOSIS — K644 Residual hemorrhoidal skin tags: Secondary | ICD-10-CM

## 2023-04-13 DIAGNOSIS — Z719 Counseling, unspecified: Secondary | ICD-10-CM

## 2023-04-13 MED ORDER — HYDROCORTISONE (PERIANAL) 2.5 % EX CREA
1.0000 | TOPICAL_CREAM | Freq: Two times a day (BID) | CUTANEOUS | 0 refills | Status: AC
Start: 2023-04-13 — End: ?

## 2023-04-13 NOTE — Progress Notes (Signed)
 Preoperative Dx: hyperpigmentation of face  Postoperative Dx:  same  Procedure: laser to face   Anesthesia: none  Description of Procedure:  Risks and complications were explained to the patient. Consent was confirmed and signed. Eye protection was placed. Time out was called and all information was confirmed to be correct. The area  area was prepped with alcohol and wiped dry. The heroic laser was set at 532 nm and preset J/cm2. The face was lasered. The patient tolerated the procedure well and there were no complications. The patient is to follow up in 4 weeks.

## 2023-04-17 ENCOUNTER — Other Ambulatory Visit: Payer: Self-pay | Admitting: Family Medicine

## 2023-04-21 DIAGNOSIS — D2261 Melanocytic nevi of right upper limb, including shoulder: Secondary | ICD-10-CM | POA: Diagnosis not present

## 2023-04-21 DIAGNOSIS — Z85828 Personal history of other malignant neoplasm of skin: Secondary | ICD-10-CM | POA: Diagnosis not present

## 2023-04-21 DIAGNOSIS — L820 Inflamed seborrheic keratosis: Secondary | ICD-10-CM | POA: Diagnosis not present

## 2023-04-21 DIAGNOSIS — L821 Other seborrheic keratosis: Secondary | ICD-10-CM | POA: Diagnosis not present

## 2023-04-28 DIAGNOSIS — H00012 Hordeolum externum right lower eyelid: Secondary | ICD-10-CM | POA: Diagnosis not present

## 2023-04-28 DIAGNOSIS — H0102B Squamous blepharitis left eye, upper and lower eyelids: Secondary | ICD-10-CM | POA: Diagnosis not present

## 2023-04-28 DIAGNOSIS — H0102A Squamous blepharitis right eye, upper and lower eyelids: Secondary | ICD-10-CM | POA: Diagnosis not present

## 2023-04-28 DIAGNOSIS — B880 Other acariasis: Secondary | ICD-10-CM | POA: Diagnosis not present

## 2023-05-04 ENCOUNTER — Ambulatory Visit (INDEPENDENT_AMBULATORY_CARE_PROVIDER_SITE_OTHER): Payer: Self-pay | Admitting: Plastic Surgery

## 2023-05-04 DIAGNOSIS — Z719 Counseling, unspecified: Secondary | ICD-10-CM

## 2023-05-04 NOTE — Progress Notes (Signed)
 Preoperative Dx: Hyperpigmentation of face  Postoperative Dx:  same  Procedure: laser to face  Anesthesia: none  Description of Procedure:  Risks and complications were explained to the patient. Consent was confirmed and signed. Eye protection was placed. Time out was called and all information was confirmed to be correct. The area  area was prepped with alcohol and wiped dry. The heroic laser was set at 532 nm 2.5 J/cm2. The face was lasered.  Additional spots were lasered for vascular and for pigment with the 515 nm and the 560 nm.  The patient tolerated the procedure well and there were no complications. The patient is to follow up in 4 weeks.

## 2023-05-12 ENCOUNTER — Other Ambulatory Visit: Payer: Medicare PPO

## 2023-05-18 DIAGNOSIS — M25561 Pain in right knee: Secondary | ICD-10-CM | POA: Diagnosis not present

## 2023-05-18 DIAGNOSIS — M25562 Pain in left knee: Secondary | ICD-10-CM | POA: Diagnosis not present

## 2023-05-19 ENCOUNTER — Ambulatory Visit: Attending: Family Medicine | Admitting: Physical Therapy

## 2023-05-19 ENCOUNTER — Encounter: Payer: Self-pay | Admitting: Physical Therapy

## 2023-05-19 DIAGNOSIS — M5459 Other low back pain: Secondary | ICD-10-CM | POA: Insufficient documentation

## 2023-05-19 DIAGNOSIS — M25662 Stiffness of left knee, not elsewhere classified: Secondary | ICD-10-CM | POA: Diagnosis not present

## 2023-05-19 DIAGNOSIS — R262 Difficulty in walking, not elsewhere classified: Secondary | ICD-10-CM | POA: Insufficient documentation

## 2023-05-19 DIAGNOSIS — M25562 Pain in left knee: Secondary | ICD-10-CM | POA: Insufficient documentation

## 2023-05-19 DIAGNOSIS — M542 Cervicalgia: Secondary | ICD-10-CM | POA: Diagnosis not present

## 2023-05-19 NOTE — Therapy (Signed)
 OUTPATIENT PHYSICAL THERAPY LOWER EXTREMITY EVALUATION   Patient Name: [name] MRN: [id number] DOB:04/17/1950, 73 y.o., female Today's Date: 05/19/2023  END OF SESSION:  PT End of Session - 05/19/23 1018     Visit Number 1    Number of Visits 13    Date for PT Re-Evaluation 08/18/23    Authorization Type [employer]    PT Start Time 1015    PT Stop Time 1100    PT Time Calculation (min) 45 min    Activity Tolerance Patient tolerated treatment well    Behavior During Therapy WFL for tasks assessed/performed             Past Medical History:  Diagnosis Date   Anal fissure    Anemia    during pregnancy   Anxiety    Basal cell carcinoma    DDD (degenerative disc disease)    Diverticulosis    Hypokalemia    LBP (low back pain)    Melanoma (HCC)    Migraine    MVP (mitral valve prolapse) 1990   psa   Rosacea    Ulcer    as a child   Past Surgical History:  Procedure Laterality Date   ABDOMINAL HYSTERECTOMY  1985   BREAST BIOPSY Left    secondary to infection   I & D EXTREMITY Right 09/09/2012   Procedure: Minor IRRIGATION AND DEBRIDEMENT EXTREMITY paronychia of right thumb;  Surgeon: Nicki Reaper, MD;  Location: [facility];  Service: Orthopedics;  Laterality: Right;   I & D EXTREMITY Left 01/26/2014   Procedure: MINOR IINCISION AND DRAINAGE paronychia left index finger;  Surgeon: Cindee Salt, MD;  Location: [facility];  Service: Orthopedics;  Laterality: Left;  left index   OOPHORECTOMY Left 1994   REPAIR KNEE LIGAMENT Left 1989   TONSILLECTOMY  1955   TUBAL LIGATION     VEIN SURGERY     Patient Active Problem List   Diagnosis Date Noted   Post-viral cough syndrome 03/18/2023   Upper respiratory tract infection 02/26/2023   Nausea 02/26/2023   Pain of left hip 11/13/2022   Cervical strain 11/13/2022   Sciatica of right side 09/30/2022   B12 deficiency 09/30/2022   Anxiety 09/30/2022   Agatston coronary artery calcium score  between 200 and 399 08/03/2022   Osteopenia of necks of both femurs 06/23/2022   Dupuytren's contracture of both hands 06/23/2022   Low TSH level 05/20/2022   Prediabetes 05/20/2022   Cellulitis of finger of right hand 09/08/2021   Infection of skin 05/13/2021   Cervicalgia 05/06/2021   Grieving 05/06/2021   Conductive hearing loss of right ear 03/04/2021   Impacted cerumen of right ear 03/04/2021   Medication side effect 03/03/2021   Excessive cerumen in right ear canal 03/03/2021   Labyrinthitis 07/30/2020   Palpitations 04/15/2020   Plantar fasciitis 04/15/2020   Dysthymia 04/15/2020   Vitamin D deficiency 04/15/2020   Gastroesophageal reflux disease 04/15/2020   Insect bite of left foot 11/24/2019   Pustule 11/24/2019   Pain of right thumb 03/09/2019   External hemorrhoids 03/03/2018   Other chest pain 02/23/2018   Insomnia 02/24/2017   Healthcare maintenance 02/24/2017   Conjunctivitis 03/01/2014   Seborrheic keratosis, inflamed 01/05/2012   Menopausal syndrome 12/22/2011   Encounter for counseling 11/11/2011   Skin cancer 07/14/2011   Basal cell carcinoma of face 03/10/2011   Migraine 05/14/2007   IRRITABLE BOWEL SYNDROME 05/14/2007   History of mitral valve  prolapse 05/14/2007   Allergic rhinitis 04/07/2007   DERMATITIS DUE TO SOLAR RADIATION 04/07/2007   Premature beat 09/20/2006   DIVERTICULOSIS, COLON 04/13/2002    PCP: Doreene Burke, MD  REFERRING PROVIDER: Karen Chafe, MD  REFERRING DIAG: Left knee pain, suspected meniscus teat  THERAPY DIAG:  Acute pain of left knee  Stiffness of left knee, not elsewhere classified  Difficulty in walking, not elsewhere classified  Rationale for Evaluation and Treatment: Rehabilitation  ONSET DATE: 05/12/23  SUBJECTIVE:   SUBJECTIVE STATEMENT: Reports doing yard work about 2 weeks ago, some swelling but no pain, had some stiffness but no pain, then travel to the beach and started unloading luggage and started having left  knee pain, reports that she has been wearing flip flops a lot, had x-rays MD suspects meniscus tear, had a cortisone injection yesterday, has had arthroscopic surgery for meniscus issue in 1988  PERTINENT HISTORY: See above PAIN:  Are you having pain? Yes: NPRS scale: 0/10 Pain location: left anteromedial knee Pain description: sharp ache Aggravating factors: walking, standing, stairs, pain up to 10/10 Relieving factors: sitting and rest, ibuprofen pain can be 0/10  PRECAUTIONS: None  RED FLAGS: None   WEIGHT BEARING RESTRICTIONS: No  FALLS:  Has patient fallen in last 6 months? No  LIVING ENVIRONMENT: Lives with: lives with their family Lives in: House/apartment Stairs: Yes: Internal: 15 steps; can reach both Has following equipment at home: None  OCCUPATION: retired  PLOF: Independent and housework yardwork, walk a mile a day  PATIENT GOALS: have less pain and walk and do things without pain  NEXT MD VISIT: 4-6 weeks  OBJECTIVE:  Note: Objective measures were completed at Evaluation unless otherwise noted.  DIAGNOSTIC FINDINGS: some arthritis  PATIENT SURVEYS:  LEFS 27.5%  COGNITION: Overall cognitive status: Within functional limits for tasks assessed     SENSATION: WFL  EDEMA:  Puffy left medial knee  MUSCLE LENGTH: Tight HS, calves and piriformis  PALPATION: Mild tenderness   LOWER EXTREMITY ROM:  Active ROM Right eval Left eval  Hip flexion    Hip extension    Hip abduction    Hip adduction    Hip internal rotation    Hip external rotation    Knee flexion  115  Knee extension  0  Ankle dorsiflexion    Ankle plantarflexion    Ankle inversion    Ankle eversion     (Blank rows = not tested)  LOWER EXTREMITY MMT:  MMT Right eval Left eval  Hip flexion    Hip extension    Hip abduction    Hip adduction    Hip internal rotation    Hip external rotation    Knee flexion  3+  Knee extension  4-  Ankle dorsiflexion    Ankle  plantarflexion    Ankle inversion    Ankle eversion     (Blank rows = not tested)  LOWER EXTREMITY SPECIAL TESTS:  Knee special tests: fearful of any compression with twist of the knee , apprehensive with any meniscus testing  FUNCTIONAL TESTS:  5 times sit to stand: 18 seconds  GAIT: Distance walked: 100 feet Assistive device utilized: None Level of assistance: Complete Independence Comments: left knee stiff, does not bend the knee, antalgic on the left  TREATMENT DATE:  05/19/23 Evaluation and went over HEP from MD    PATIENT EDUCATION:  Education details: POC/HEP Person educated: Patient Education method: Programmer, multimedia, Demonstration, Tactile cues, Verbal cues, and Handouts Education comprehension: verbalized understanding  HOME EXERCISE PROGRAM: MD issued HEP  ASSESSMENT:  CLINICAL IMPRESSION: Patient is a 73 y.o. female who was seen today for physical therapy evaluation and treatment for left knee pain and she tells me that the MD felt like it could be a meniscus tear.  She has good ROM but is very tight HS, ITB, calves and piriformis mms.  Apprehensive with any meniscus special testing.  HAs difficulty with lying down due to vertigo issues so we tweaked the MD HEP  OBJECTIVE IMPAIRMENTS: Abnormal gait, cardiopulmonary status limiting activity, decreased activity tolerance, decreased balance, decreased coordination, decreased endurance, decreased mobility, difficulty walking, decreased ROM, decreased safety awareness, increased edema, increased muscle spasms, impaired flexibility, improper body mechanics, and pain.   REHAB POTENTIAL: Good  CLINICAL DECISION MAKING: Stable/uncomplicated  EVALUATION COMPLEXITY: Low   GOALS: Goals reviewed with patient? Yes  SHORT TERM GOALS: Target date: 06/07/23 Independent with initial HEP Baseline: Goal  status: INITIAL   LONG TERM GOALS: Target date: 08/24/23  Independent with advanced HEP Baseline:  Goal status: INITIAL  2.  Decrease pain with walking 50% Baseline: pain up to 10/10 Goal status: INITIAL  3.  Perform 5XSTS in 15 seconds Baseline:  Goal status: INITIAL  4.  Improve LEFS score by 20% points Baseline:  Goal status: INITIAL  5.  Increase knee strength to 4+/5 Baseline:  Goal status: INITIAL  PLAN:  PT FREQUENCY: 1x/week  PT DURATION: 12 weeks  PLANNED INTERVENTIONS: 97164- PT Re-evaluation, 97110-Therapeutic exercises, 97530- Therapeutic activity, 97112- Neuromuscular re-education, 97535- Self Care, 40981- Manual therapy, [phone]- Gait training, [phone]- Electrical stimulation (unattended), 97016- Vasopneumatic device, 97035- Ultrasound, 21308- Ionotophoresis 4mg /ml Dexamethasone, Patient/Family education, Balance training, Stair training, Taping, Dry Needling, Joint mobilization, Cryotherapy, and Moist heat  PLAN FOR NEXT SESSION: Patient does travel a lot and is out of time a lot so will spread out visits   Ahmira Boisselle W, PT 05/19/2023, 10:20 AM

## 2023-05-21 NOTE — Addendum Note (Signed)
 Addended by: Jearld Lesch on: 05/21/2023 11:03 AM   Modules accepted: Orders

## 2023-05-24 DIAGNOSIS — R300 Dysuria: Secondary | ICD-10-CM | POA: Diagnosis not present

## 2023-06-07 ENCOUNTER — Ambulatory Visit: Admitting: Physical Therapy

## 2023-06-07 DIAGNOSIS — H0102B Squamous blepharitis left eye, upper and lower eyelids: Secondary | ICD-10-CM | POA: Diagnosis not present

## 2023-06-07 DIAGNOSIS — H0102A Squamous blepharitis right eye, upper and lower eyelids: Secondary | ICD-10-CM | POA: Diagnosis not present

## 2023-06-07 DIAGNOSIS — H00012 Hordeolum externum right lower eyelid: Secondary | ICD-10-CM | POA: Diagnosis not present

## 2023-06-07 DIAGNOSIS — B880 Other acariasis: Secondary | ICD-10-CM | POA: Diagnosis not present

## 2023-06-08 ENCOUNTER — Encounter: Payer: Self-pay | Admitting: Physical Therapy

## 2023-06-08 ENCOUNTER — Ambulatory Visit: Admitting: Physical Therapy

## 2023-06-08 DIAGNOSIS — R262 Difficulty in walking, not elsewhere classified: Secondary | ICD-10-CM | POA: Diagnosis not present

## 2023-06-08 DIAGNOSIS — Z1231 Encounter for screening mammogram for malignant neoplasm of breast: Secondary | ICD-10-CM | POA: Diagnosis not present

## 2023-06-08 DIAGNOSIS — Z01419 Encounter for gynecological examination (general) (routine) without abnormal findings: Secondary | ICD-10-CM | POA: Diagnosis not present

## 2023-06-08 DIAGNOSIS — M25662 Stiffness of left knee, not elsewhere classified: Secondary | ICD-10-CM

## 2023-06-08 DIAGNOSIS — M542 Cervicalgia: Secondary | ICD-10-CM

## 2023-06-08 DIAGNOSIS — M25562 Pain in left knee: Secondary | ICD-10-CM | POA: Diagnosis not present

## 2023-06-08 DIAGNOSIS — M5459 Other low back pain: Secondary | ICD-10-CM

## 2023-06-08 NOTE — Therapy (Signed)
 OUTPATIENT PHYSICAL THERAPY LOWER EXTREMITY EVALUATION   Patient Name: [name] MRN: [id number] DOB:11/28/1950, 73 y.o., female Today's Date: 06/08/2023  END OF SESSION:  PT End of Session - 06/08/23 1519     Visit Number 2    Number of Visits 13    Date for PT Re-Evaluation 08/24/23    Authorization Type [employer]    PT Start Time 1523    PT Stop Time 1610    PT Time Calculation (min) 47 min    Activity Tolerance Patient tolerated treatment well    Behavior During Therapy WFL for tasks assessed/performed             Past Medical History:  Diagnosis Date   Anal fissure    Anemia    during pregnancy   Anxiety    Basal cell carcinoma    DDD (degenerative disc disease)    Diverticulosis    Hypokalemia    LBP (low back pain)    Melanoma (HCC)    Migraine    MVP (mitral valve prolapse) 1990   psa   Rosacea    Ulcer    as a child   Past Surgical History:  Procedure Laterality Date   ABDOMINAL HYSTERECTOMY  1985   BREAST BIOPSY Left    secondary to infection   I & D EXTREMITY Right 09/09/2012   Procedure: Minor IRRIGATION AND DEBRIDEMENT EXTREMITY paronychia of right thumb;  Surgeon: Kemp Patter, MD;  Location: [facility];  Service: Orthopedics;  Laterality: Right;   I & D EXTREMITY Left 01/26/2014   Procedure: MINOR IINCISION AND DRAINAGE paronychia left index finger;  Surgeon: Lyanne Sample, MD;  Location: [facility];  Service: Orthopedics;  Laterality: Left;  left index   OOPHORECTOMY Left 1994   REPAIR KNEE LIGAMENT Left 1989   TONSILLECTOMY  1955   TUBAL LIGATION     VEIN SURGERY     Patient Active Problem List   Diagnosis Date Noted   Post-viral cough syndrome 03/18/2023   Upper respiratory tract infection 02/26/2023   Nausea 02/26/2023   Pain of left hip 11/13/2022   Cervical strain 11/13/2022   Sciatica of right side 09/30/2022   B12 deficiency 09/30/2022   Anxiety 09/30/2022   Agatston coronary artery calcium  score  between 200 and 399 08/03/2022   Osteopenia of necks of both femurs 06/23/2022   Dupuytren's contracture of both hands 06/23/2022   Low TSH level 05/20/2022   Prediabetes 05/20/2022   Cellulitis of finger of right hand 09/08/2021   Infection of skin 05/13/2021   Cervicalgia 05/06/2021   Grieving 05/06/2021   Conductive hearing loss of right ear 03/04/2021   Impacted cerumen of right ear 03/04/2021   Medication side effect 03/03/2021   Excessive cerumen in right ear canal 03/03/2021   Labyrinthitis 07/30/2020   Palpitations 04/15/2020   Plantar fasciitis 04/15/2020   Dysthymia 04/15/2020   Vitamin D  deficiency 04/15/2020   Gastroesophageal reflux disease 04/15/2020   Insect bite of left foot 11/24/2019   Pustule 11/24/2019   Pain of right thumb 03/09/2019   External hemorrhoids 03/03/2018   Other chest pain 02/23/2018   Insomnia 02/24/2017   Healthcare maintenance 02/24/2017   Conjunctivitis 03/01/2014   Seborrheic keratosis, inflamed 01/05/2012   Menopausal syndrome 12/22/2011   Encounter for counseling 11/11/2011   Skin cancer 07/14/2011   Basal cell carcinoma of face 03/10/2011   Migraine 05/14/2007   IRRITABLE BOWEL SYNDROME 05/14/2007   History of mitral valve  prolapse 05/14/2007   Allergic rhinitis 04/07/2007   DERMATITIS DUE TO SOLAR RADIATION 04/07/2007   Premature beat 09/20/2006   DIVERTICULOSIS, COLON 04/13/2002    PCP: Tilmon Font, MD  REFERRING PROVIDER: Alejandro Amour, MD  REFERRING DIAG: Left knee pain, suspected meniscus teat  THERAPY DIAG:  Acute pain of left knee  Stiffness of left knee, not elsewhere classified  Difficulty in walking, not elsewhere classified  Other low back pain  Cervicalgia  Rationale for Evaluation and Treatment: Rehabilitation  ONSET DATE: 05/12/23  SUBJECTIVE:   SUBJECTIVE STATEMENT: Patient reports that she has been doing her stretches, reports feeling better and is going to the beach tomorrow and does not want to hurt  it  Reports doing yard work about 2 weeks ago, some swelling but no pain, had some stiffness but no pain, then travel to the beach and started unloading luggage and started having left knee pain, reports that she has been wearing flip flops a lot, had x-rays MD suspects meniscus tear, had a cortisone injection yesterday, has had arthroscopic surgery for meniscus issue in 1988  PERTINENT HISTORY: See above PAIN:  Are you having pain? Yes: NPRS scale: 0/10 Pain location: left anteromedial knee Pain description: sharp ache Aggravating factors: walking, standing, stairs, pain up to 10/10 Relieving factors: sitting and rest, ibuprofen pain can be 0/10  PRECAUTIONS: None  RED FLAGS: None   WEIGHT BEARING RESTRICTIONS: No  FALLS:  Has patient fallen in last 6 months? No  LIVING ENVIRONMENT: Lives with: lives with their family Lives in: House/apartment Stairs: Yes: Internal: 15 steps; can reach both Has following equipment at home: None  OCCUPATION: retired  PLOF: Independent and housework yardwork, walk a mile a day  PATIENT GOALS: have less pain and walk and do things without pain  NEXT MD VISIT: 4-6 weeks  OBJECTIVE:  Note: Objective measures were completed at Evaluation unless otherwise noted.  DIAGNOSTIC FINDINGS: some arthritis  PATIENT SURVEYS:  LEFS 27.5%  COGNITION: Overall cognitive status: Within functional limits for tasks assessed     SENSATION: WFL  EDEMA:  Puffy left medial knee  MUSCLE LENGTH: Tight HS, calves and piriformis  PALPATION: Mild tenderness   LOWER EXTREMITY ROM:  Active ROM Right eval Left eval  Hip flexion    Hip extension    Hip abduction    Hip adduction    Hip internal rotation    Hip external rotation    Knee flexion  115  Knee extension  0  Ankle dorsiflexion    Ankle plantarflexion    Ankle inversion    Ankle eversion     (Blank rows = not tested)  LOWER EXTREMITY MMT:  MMT Right eval Left eval  Hip  flexion    Hip extension    Hip abduction    Hip adduction    Hip internal rotation    Hip external rotation    Knee flexion  3+  Knee extension  4-  Ankle dorsiflexion    Ankle plantarflexion    Ankle inversion    Ankle eversion     (Blank rows = not tested)  LOWER EXTREMITY SPECIAL TESTS:  Knee special tests: fearful of any compression with twist of the knee , apprehensive with any meniscus testing  FUNCTIONAL TESTS:  5 times sit to stand: 18 seconds  GAIT: Distance walked: 100 feet Assistive device utilized: None Level of assistance: Complete Independence Comments: left knee stiff, does not bend the knee, antalgic on the left  TREATMENT DATE:  06/08/23 Bike x 5 minutes Gait outside good pace no rest around the back parking island   Calf stretches HS stretches Piriformis and ITB stretches Quad sets STM to the calf, quad, HS and ITB  05/19/23 Evaluation and went over HEP from MD    PATIENT EDUCATION:  Education details: POC/HEP Person educated: Patient Education method: Programmer, multimedia, Facilities manager, Actor cues, Verbal cues, and Handouts Education comprehension: verbalized understanding  HOME EXERCISE PROGRAM: MD issued HEP  ASSESSMENT:  CLINICAL IMPRESSION: Patient continues to be very tight in the LE's, she does report doing the stretches, was a little cautious today as she is going to the beach tomorrow and is feeling good so does not want to hurt it  Patient is a 73 y.o. female who was seen today for physical therapy evaluation and treatment for left knee pain and she tells me that the MD felt like it could be a meniscus tear.  She has good ROM but is very tight HS, ITB, calves and piriformis mms.  Apprehensive with any meniscus special testing.  HAs difficulty with lying down due to vertigo issues so we tweaked the MD HEP  OBJECTIVE  IMPAIRMENTS: Abnormal gait, cardiopulmonary status limiting activity, decreased activity tolerance, decreased balance, decreased coordination, decreased endurance, decreased mobility, difficulty walking, decreased ROM, decreased safety awareness, increased edema, increased muscle spasms, impaired flexibility, improper body mechanics, and pain.   REHAB POTENTIAL: Good  CLINICAL DECISION MAKING: Stable/uncomplicated  EVALUATION COMPLEXITY: Low   GOALS: Goals reviewed with patient? Yes  SHORT TERM GOALS: Target date: 06/07/23 Independent with initial HEP Baseline: Goal status: met 06/08/23   LONG TERM GOALS: Target date: 08/24/23  Independent with advanced HEP Baseline:  Goal status: INITIAL  2.  Decrease pain with walking 50% Baseline: pain up to 10/10 Goal status: INITIAL  3.  Perform 5XSTS in 15 seconds Baseline:  Goal status: INITIAL  4.  Improve LEFS score by 20% points Baseline:  Goal status: INITIAL  5.  Increase knee strength to 4+/5 Baseline:  Goal status: INITIAL  PLAN:  PT FREQUENCY: 1x/week  PT DURATION: 12 weeks  PLANNED INTERVENTIONS: 97164- PT Re-evaluation, 97110-Therapeutic exercises, 97530- Therapeutic activity, 97112- Neuromuscular re-education, 97535- Self Care, 16109- Manual therapy, [phone]- Gait training, [phone]- Electrical stimulation (unattended), 97016- Vasopneumatic device, 97035- Ultrasound, 91478- Ionotophoresis 4mg /ml Dexamethasone, Patient/Family education, Balance training, Stair training, Taping, Dry Needling, Joint mobilization, Cryotherapy, and Moist heat  PLAN FOR NEXT SESSION: Patient does travel a lot and is out of time a lot so will spread out visits   Hollis Lurie, PT 06/08/2023, 3:22 PM

## 2023-06-10 ENCOUNTER — Encounter: Payer: Self-pay | Admitting: Cardiovascular Disease

## 2023-06-15 ENCOUNTER — Encounter: Payer: Self-pay | Admitting: Family Medicine

## 2023-06-15 ENCOUNTER — Ambulatory Visit: Attending: Family Medicine | Admitting: Physical Therapy

## 2023-06-15 ENCOUNTER — Encounter: Payer: Self-pay | Admitting: Physical Therapy

## 2023-06-15 ENCOUNTER — Ambulatory Visit (INDEPENDENT_AMBULATORY_CARE_PROVIDER_SITE_OTHER): Payer: Medicare PPO | Admitting: Family Medicine

## 2023-06-15 VITALS — BP 124/64 | HR 60 | Temp 97.5°F | Ht 61.0 in | Wt 113.0 lb

## 2023-06-15 DIAGNOSIS — R7303 Prediabetes: Secondary | ICD-10-CM

## 2023-06-15 DIAGNOSIS — G43C0 Periodic headache syndromes in child or adult, not intractable: Secondary | ICD-10-CM | POA: Diagnosis not present

## 2023-06-15 DIAGNOSIS — E538 Deficiency of other specified B group vitamins: Secondary | ICD-10-CM | POA: Diagnosis not present

## 2023-06-15 DIAGNOSIS — M5459 Other low back pain: Secondary | ICD-10-CM | POA: Diagnosis not present

## 2023-06-15 DIAGNOSIS — M25662 Stiffness of left knee, not elsewhere classified: Secondary | ICD-10-CM | POA: Insufficient documentation

## 2023-06-15 DIAGNOSIS — R931 Abnormal findings on diagnostic imaging of heart and coronary circulation: Secondary | ICD-10-CM

## 2023-06-15 DIAGNOSIS — R262 Difficulty in walking, not elsewhere classified: Secondary | ICD-10-CM | POA: Diagnosis not present

## 2023-06-15 DIAGNOSIS — M25562 Pain in left knee: Secondary | ICD-10-CM | POA: Diagnosis not present

## 2023-06-15 DIAGNOSIS — M542 Cervicalgia: Secondary | ICD-10-CM | POA: Insufficient documentation

## 2023-06-15 DIAGNOSIS — Z0000 Encounter for general adult medical examination without abnormal findings: Secondary | ICD-10-CM | POA: Diagnosis not present

## 2023-06-15 DIAGNOSIS — M25552 Pain in left hip: Secondary | ICD-10-CM | POA: Insufficient documentation

## 2023-06-15 DIAGNOSIS — M6281 Muscle weakness (generalized): Secondary | ICD-10-CM | POA: Diagnosis not present

## 2023-06-15 LAB — BASIC METABOLIC PANEL WITH GFR
BUN: 15 mg/dL (ref 6–23)
CO2: 29 meq/L (ref 19–32)
Calcium: 10.1 mg/dL (ref 8.4–10.5)
Chloride: 101 meq/L (ref 96–112)
Creatinine, Ser: 0.6 mg/dL (ref 0.40–1.20)
GFR: 88.22 mL/min (ref >60.00)
Glucose, Bld: 98 mg/dL (ref 70–99)
Potassium: 4.2 meq/L (ref 3.5–5.1)
Sodium: 139 meq/L (ref 135–145)

## 2023-06-15 LAB — URINALYSIS, ROUTINE W REFLEX MICROSCOPIC
Bilirubin Urine: NEGATIVE
Hgb urine dipstick: NEGATIVE
Ketones, ur: NEGATIVE
Leukocytes,Ua: NEGATIVE
Nitrite: NEGATIVE
Specific Gravity, Urine: <=1.005 — AB (ref 1.000–1.030)
Total Protein, Urine: NEGATIVE
Urine Glucose: NEGATIVE
Urobilinogen, UA: 0.2 (ref 0.0–1.0)
pH: 6 (ref 5.0–8.0)

## 2023-06-15 LAB — CBC
HCT: 39.7 % (ref 36.0–46.0)
Hemoglobin: 13.1 g/dL (ref 12.0–15.0)
MCHC: 33.1 g/dL (ref 30.0–36.0)
MCV: 97.1 fl (ref 78.0–100.0)
Platelets: 309 10*3/uL (ref 150.0–400.0)
RBC: 4.09 Mil/uL (ref 3.87–5.11)
RDW: 13 % (ref 11.5–15.5)
WBC: 7.7 10*3/uL (ref 4.0–10.5)

## 2023-06-15 LAB — VITAMIN B12: Vitamin B-12: 707 pg/mL (ref 211–911)

## 2023-06-15 LAB — HEMOGLOBIN A1C: Hgb A1c MFr Bld: 6.1 % (ref 4.6–6.5)

## 2023-06-15 MED ORDER — ZOLMITRIPTAN 2.5 MG PO TBDP: ORAL_TABLET | ORAL | 4 refills | Status: AC

## 2023-06-15 NOTE — Progress Notes (Signed)
 Established Patient Office Visit   Subjective:  Patient ID: [name], female    DOB: 1950-12-02  Age: 73 y.o. MRN: [id number]  Chief Complaint  Patient presents with   Medical Management of Chronic Issues    3 month follow up. Pt is fasting.    Neck Pain    Pt complains of a painful knot on the left posterior neck.     Neck Pain  Pertinent negatives include no tingling or weakness.   Encounter Diagnoses  Name Primary?   Healthcare maintenance Yes   Periodic headache syndrome, not intractable    Prediabetes    B12 deficiency    Agatston coronary artery calcium  score between 200 and 399    In for physical today in follow-up of above.  She is doing well.  Up-to-date on health maintenance.  She is active physically but has no regular exercise program.  She has regular dental care.  She lives at home with her husband.   Review of Systems  Constitutional: Negative.   HENT: Negative.    Eyes:  Negative for blurred vision, discharge and redness.  Respiratory: Negative.    Cardiovascular: Negative.   Gastrointestinal:  Negative for abdominal pain.  Genitourinary: Negative.   Musculoskeletal:  Positive for neck pain. Negative for myalgias.  Skin:  Negative for rash.  Neurological:  Negative for tingling, loss of consciousness and weakness.  Endo/Heme/Allergies:  Negative for polydipsia.     Current Outpatient Medications:    Ascorbic Acid (VITAMIN C) 1000 MG tablet, , Disp: , Rfl:    atenolol  (TENORMIN ) 25 MG tablet, TAKE 1/2 TABLET(12.5 MG) BY MOUTH DAILY AS NEEDED FOR PALPITATIONS, Disp: 90 tablet, Rfl: 3   calcium -vitamin D  250-100 MG-UNIT per tablet, Take 1 tablet by mouth daily., Disp: , Rfl:    Coenzyme Q10 (CO Q10) 100 MG CAPS, Take by mouth daily., Disp: , Rfl:    Doxylamine Succinate, Sleep, (SLEEP-AID PO), Take by mouth. [employer] sleep aid, Disp: , Rfl:    estradiol (ESTRACE) 0.5 MG tablet, Take 0.5 mg by mouth daily., Disp: , Rfl:    hydrocortisone  (ANUSOL -HC) 2.5  % rectal cream, Place 1 Application rectally 2 (two) times daily., Disp: 30 g, Rfl: 0   L-LYSINE PO, Take 1 tablet by mouth 2 (two) times a week., Disp: , Rfl:    magnesium citrate SOLN, Take 1 Bottle by mouth once., Disp: , Rfl:    meclizine  (ANTIVERT ) 12.5 MG tablet, meclizine  12.5 mg tablet  one tablet every 6-12 hours as needed for dizziness, Disp: 30 tablet, Rfl: 1   meloxicam  (MOBIC ) 15 MG tablet, TAKE 1 TABLET BY MOUTH AS NEEDED, Disp: 30 tablet, Rfl: 1   metFORMIN  (GLUCOPHAGE -XR) 500 MG 24 hr tablet, TAKE 1 TABLET BY MOUTH AT BEDTIME, Disp: 90 tablet, Rfl: 0   Multiple Vitamin (MULTIVITAMIN) tablet, Take 1 tablet by mouth daily., Disp: , Rfl:    omeprazole  (PRILOSEC) 20 MG capsule, Take 20 mg by mouth daily., Disp: , Rfl:    Phenyleph-Diphenhyd-Hydrocod (HYDRO-DP PO), Take by mouth. Hydroeye, Disp: , Rfl:    RESTASIS 0.05 % ophthalmic emulsion, , Disp: , Rfl:    SAMBUCOL BLACK ELDERBERRY PO, Take 1 capsule by mouth 2 (two) times a week., Disp: , Rfl:    vitamin B-12 (CYANOCOBALAMIN ) 50 MCG tablet, Take 50 mcg by mouth daily., Disp: , Rfl:    rosuvastatin  (CRESTOR ) 10 MG tablet, Take 1 tablet (10 mg total) by mouth daily., Disp: 90 tablet, Rfl: 3  zolmitriptan  (ZOMIG -ZMT) 2.5 MG disintegrating tablet, TAKE 1 TABLET BY MOUTH AS NEEDED FOR MIGRAINE, Disp: 10 tablet, Rfl: 4   Objective:     BP 124/64 (Cuff Size: Normal)   Pulse 60   Temp (!) 97.5 F (36.4 C) (Temporal)   Ht 5\' 1"  (1.549 m)   Wt 113 lb (51.3 kg)   SpO2 99%   BMI 21.35 kg/m  BP Readings from Last 3 Encounters:  06/15/23 124/64  03/18/23 118/82  02/26/23 124/63   Wt Readings from Last 3 Encounters:  06/15/23 113 lb (51.3 kg)  03/18/23 116 lb 12.8 oz (53 kg)  02/26/23 115 lb (52.2 kg)      Physical Exam Constitutional:      General: She is not in acute distress.    Appearance: Normal appearance. She is not ill-appearing, toxic-appearing or diaphoretic.  HENT:     Head: Normocephalic and atraumatic.      Right Ear: Tympanic membrane, ear canal and external ear normal.     Left Ear: Tympanic membrane, ear canal and external ear normal.     Mouth/Throat:     Mouth: Mucous membranes are moist.     Pharynx: Oropharynx is clear. No oropharyngeal exudate or posterior oropharyngeal erythema.  Eyes:     General: No scleral icterus.       Right eye: No discharge.        Left eye: No discharge.     Extraocular Movements: Extraocular movements intact.     Conjunctiva/sclera: Conjunctivae normal.     Pupils: Pupils are equal, round, and reactive to light.  Cardiovascular:     Rate and Rhythm: Normal rate and regular rhythm.  Pulmonary:     Effort: Pulmonary effort is normal. No respiratory distress.     Breath sounds: Normal breath sounds.  Abdominal:     General: Bowel sounds are normal.  Musculoskeletal:     Cervical back: No rigidity or tenderness.     Right lower leg: No edema.     Left lower leg: No edema.  Lymphadenopathy:     Cervical: No cervical adenopathy.  Skin:    General: Skin is warm and dry.  Neurological:     Mental Status: She is alert and oriented to person, place, and time.  Psychiatric:        Mood and Affect: Mood normal.        Behavior: Behavior normal.      No results found for any visits on 06/15/23.    The 10-year ASCVD risk score (Arnett DK, et al., 2019) is: 17.3%    Assessment & Plan:   Healthcare maintenance -     CBC -     Urinalysis, Routine w reflex microscopic  Periodic headache syndrome, not intractable -     ZOLMitriptan ; TAKE 1 TABLET BY MOUTH AS NEEDED FOR MIGRAINE  Dispense: 10 tablet; Refill: 4  Prediabetes -     Basic metabolic panel with GFR -     Hemoglobin A1c  B12 deficiency -     Vitamin B12  Agatston coronary artery calcium  score between 200 and 399 -     Lipoprotein A (LPA)    Return in about 6 months (around 12/16/2023), or if symptoms worsen or fail to improve.  Encouraged regular dedicated exercise such as walking  for 30 minutes daily.  Information was given on health maintenance and disease prevention.  Continue all medications as above.  Tonna Frederic, MD

## 2023-06-15 NOTE — Therapy (Signed)
 OUTPATIENT PHYSICAL THERAPY LOWER EXTREMITY EVALUATION   Patient Name: [name] MRN: [id number] DOB:February 13, 1950, 73 y.o., female Today's Date: 06/15/2023  END OF SESSION:  PT End of Session - 06/15/23 1057     Visit Number 3    Number of Visits 13    Date for PT Re-Evaluation 08/24/23    Authorization Type [employer]    PT Start Time 1057    PT Stop Time 1143    PT Time Calculation (min) 46 min    Activity Tolerance Patient tolerated treatment well    Behavior During Therapy WFL for tasks assessed/performed             Past Medical History:  Diagnosis Date   Anal fissure    Anemia    during pregnancy   Anxiety    Basal cell carcinoma    DDD (degenerative disc disease)    Diverticulosis    Hypokalemia    LBP (low back pain)    Melanoma (HCC)    Migraine    MVP (mitral valve prolapse) 1990   psa   Rosacea    Ulcer    as a child   Past Surgical History:  Procedure Laterality Date   ABDOMINAL HYSTERECTOMY  1985   BREAST BIOPSY Left    secondary to infection   I & D EXTREMITY Right 09/09/2012   Procedure: Minor IRRIGATION AND DEBRIDEMENT EXTREMITY paronychia of right thumb;  Surgeon: Kemp Patter, MD;  Location: [facility];  Service: Orthopedics;  Laterality: Right;   I & D EXTREMITY Left 01/26/2014   Procedure: MINOR IINCISION AND DRAINAGE paronychia left index finger;  Surgeon: Lyanne Sample, MD;  Location: [facility];  Service: Orthopedics;  Laterality: Left;  left index   OOPHORECTOMY Left 1994   REPAIR KNEE LIGAMENT Left 1989   TONSILLECTOMY  1955   TUBAL LIGATION     VEIN SURGERY     Patient Active Problem List   Diagnosis Date Noted   Post-viral cough syndrome 03/18/2023   Upper respiratory tract infection 02/26/2023   Nausea 02/26/2023   Pain of left hip 11/13/2022   Cervical strain 11/13/2022   Sciatica of right side 09/30/2022   B12 deficiency 09/30/2022   Anxiety 09/30/2022   Agatston coronary artery calcium  score  between 200 and 399 08/03/2022   Osteopenia of necks of both femurs 06/23/2022   Dupuytren's contracture of both hands 06/23/2022   Low TSH level 05/20/2022   Prediabetes 05/20/2022   Cellulitis of finger of right hand 09/08/2021   Infection of skin 05/13/2021   Cervicalgia 05/06/2021   Grieving 05/06/2021   Conductive hearing loss of right ear 03/04/2021   Impacted cerumen of right ear 03/04/2021   Medication side effect 03/03/2021   Excessive cerumen in right ear canal 03/03/2021   Labyrinthitis 07/30/2020   Palpitations 04/15/2020   Plantar fasciitis 04/15/2020   Dysthymia 04/15/2020   Vitamin D  deficiency 04/15/2020   Gastroesophageal reflux disease 04/15/2020   Insect bite of left foot 11/24/2019   Pustule 11/24/2019   Pain of right thumb 03/09/2019   External hemorrhoids 03/03/2018   Other chest pain 02/23/2018   Insomnia 02/24/2017   Healthcare maintenance 02/24/2017   Conjunctivitis 03/01/2014   Seborrheic keratosis, inflamed 01/05/2012   Menopausal syndrome 12/22/2011   Encounter for counseling 11/11/2011   Skin cancer 07/14/2011   Basal cell carcinoma of face 03/10/2011   Migraine 05/14/2007   IRRITABLE BOWEL SYNDROME 05/14/2007   History of mitral valve  prolapse 05/14/2007   Allergic rhinitis 04/07/2007   DERMATITIS DUE TO SOLAR RADIATION 04/07/2007   Premature beat 09/20/2006   DIVERTICULOSIS, COLON 04/13/2002    PCP: Tilmon Font, MD  REFERRING PROVIDER: Alejandro Amour, MD  REFERRING DIAG: Left knee pain, suspected meniscus teat  THERAPY DIAG:  Acute pain of left knee  Stiffness of left knee, not elsewhere classified  Difficulty in walking, not elsewhere classified  Other low back pain  Cervicalgia  Pain in left hip  Muscle weakness (generalized)  Rationale for Evaluation and Treatment: Rehabilitation  ONSET DATE: 05/12/23  SUBJECTIVE:   SUBJECTIVE STATEMENT: Doing okay, reports felt really pretty good until doing yardwork yesterday.  Reports left  knee pain 4/10 and reports left neck pain with a knot  Reports doing yard work about 2 weeks ago, some swelling but no pain, had some stiffness but no pain, then travel to the beach and started unloading luggage and started having left knee pain, reports that she has been wearing flip flops a lot, had x-rays MD suspects meniscus tear, had a cortisone injection yesterday, has had arthroscopic surgery for meniscus issue in 1988  PERTINENT HISTORY: See above PAIN:  Are you having pain? Yes: NPRS scale: 0/10 Pain location: left anteromedial knee Pain description: sharp ache Aggravating factors: walking, standing, stairs, pain up to 10/10 Relieving factors: sitting and rest, ibuprofen pain can be 0/10  PRECAUTIONS: None  RED FLAGS: None   WEIGHT BEARING RESTRICTIONS: No  FALLS:  Has patient fallen in last 6 months? No  LIVING ENVIRONMENT: Lives with: lives with their family Lives in: House/apartment Stairs: Yes: Internal: 15 steps; can reach both Has following equipment at home: None  OCCUPATION: retired  PLOF: Independent and housework yardwork, walk a mile a day  PATIENT GOALS: have less pain and walk and do things without pain  NEXT MD VISIT: 4-6 weeks  OBJECTIVE:  Note: Objective measures were completed at Evaluation unless otherwise noted.  DIAGNOSTIC FINDINGS: some arthritis  PATIENT SURVEYS:  LEFS 27.5%  COGNITION: Overall cognitive status: Within functional limits for tasks assessed     SENSATION: WFL  EDEMA:  Puffy left medial knee  MUSCLE LENGTH: Tight HS, calves and piriformis  PALPATION: Mild tenderness   LOWER EXTREMITY ROM:  Active ROM Right eval Left eval  Hip flexion    Hip extension    Hip abduction    Hip adduction    Hip internal rotation    Hip external rotation    Knee flexion  115  Knee extension  0  Ankle dorsiflexion    Ankle plantarflexion    Ankle inversion    Ankle eversion     (Blank rows = not tested)  LOWER  EXTREMITY MMT:  MMT Right eval Left eval  Hip flexion    Hip extension    Hip abduction    Hip adduction    Hip internal rotation    Hip external rotation    Knee flexion  3+  Knee extension  4-  Ankle dorsiflexion    Ankle plantarflexion    Ankle inversion    Ankle eversion     (Blank rows = not tested)  LOWER EXTREMITY SPECIAL TESTS:  Knee special tests: fearful of any compression with twist of the knee , apprehensive with any meniscus testing  FUNCTIONAL TESTS:  5 times sit to stand: 18 seconds  GAIT: Distance walked: 100 feet Assistive device utilized: None Level of assistance: Complete Independence Comments: left knee stiff, does not bend the knee, antalgic  on the left                                                                                                                                TREATMENT DATE:  06/15/23 Bike level 4 x 5 mintues Nustep level 4 x 5 minutes Calf stretches HS stretches LAQ with cues for TKE/VMO contraction Upper trap and levator stretches Cervical and scapular retractions Shoulder shrugs STM to the left upper trap and neck  06/08/23 Bike x 5 minutes Gait outside good pace no rest around the back parking island   Calf stretches HS stretches Piriformis and ITB stretches Quad sets STM to the calf, quad, HS and ITB  05/19/23 Evaluation and went over HEP from MD    PATIENT EDUCATION:  Education details: POC/HEP Person educated: Patient Education method: Programmer, multimedia, Facilities manager, Actor cues, Verbal cues, and Handouts Education comprehension: verbalized understanding  HOME EXERCISE PROGRAM: MD issued HEP  ASSESSMENT:  CLINICAL IMPRESSION: Patient was doing well until doing some yardwork yesterday, she reports moving some pavers and now with pain in the left medial knee and the left neck.  She is very tight in the calves, HS and the upper traps.  She tends to be tense and needs cues to relax  Patient is a 73 y.o. female  who was seen today for physical therapy evaluation and treatment for left knee pain and she tells me that the MD felt like it could be a meniscus tear.  She has good ROM but is very tight HS, ITB, calves and piriformis mms.  Apprehensive with any meniscus special testing.  HAs difficulty with lying down due to vertigo issues so we tweaked the MD HEP  OBJECTIVE IMPAIRMENTS: Abnormal gait, cardiopulmonary status limiting activity, decreased activity tolerance, decreased balance, decreased coordination, decreased endurance, decreased mobility, difficulty walking, decreased ROM, decreased safety awareness, increased edema, increased muscle spasms, impaired flexibility, improper body mechanics, and pain.   REHAB POTENTIAL: Good  CLINICAL DECISION MAKING: Stable/uncomplicated  EVALUATION COMPLEXITY: Low   GOALS: Goals reviewed with patient? Yes  SHORT TERM GOALS: Target date: 06/07/23 Independent with initial HEP Baseline: Goal status: met 06/08/23   LONG TERM GOALS: Target date: 08/24/23  Independent with advanced HEP Baseline:  Goal status: progressing 06/15/23  2.  Decrease pain with walking 50% Baseline: pain up to 10/10 Goal status:  ongoing 06/15/23  3.  Perform 5XSTS in 15 seconds Baseline:  Goal status: INITIAL  4.  Improve LEFS score by 20% points Baseline:  Goal status: INITIAL  5.  Increase knee strength to 4+/5 Baseline:  Goal status: progressing 06/15/23  PLAN:  PT FREQUENCY: 1x/week  PT DURATION: 12 weeks  PLANNED INTERVENTIONS: 97164- PT Re-evaluation, 97110-Therapeutic exercises, 97530- Therapeutic activity, 97112- Neuromuscular re-education, 97535- Self Care, 82956- Manual therapy, [id number]- Gait training, [phone]- Electrical stimulation (unattended), 97016- Vasopneumatic device, [id number]- Ultrasound, 65784- Ionotophoresis 4mg /ml Dexamethasone, Patient/Family education, Balance training, Stair training, Taping, Dry Needling, Joint mobilization, Cryotherapy, and  Moist  heat  PLAN FOR NEXT SESSION: Patient does travel a lot and is out of time a lot so will spread out visits, she was doing well until some yardwork and she seems to have some increased pain and tension   Ayo Smoak W, PT 06/15/2023, 10:58 AM

## 2023-06-18 LAB — LIPOPROTEIN A (LPA): Lipoprotein (a): 197 nmol/L — ABNORMAL HIGH (ref <75)

## 2023-06-22 ENCOUNTER — Ambulatory Visit: Admitting: Physical Therapy

## 2023-06-22 ENCOUNTER — Encounter: Payer: Self-pay | Admitting: Physical Therapy

## 2023-06-22 DIAGNOSIS — M25552 Pain in left hip: Secondary | ICD-10-CM | POA: Diagnosis not present

## 2023-06-22 DIAGNOSIS — M5459 Other low back pain: Secondary | ICD-10-CM | POA: Diagnosis not present

## 2023-06-22 DIAGNOSIS — R262 Difficulty in walking, not elsewhere classified: Secondary | ICD-10-CM | POA: Diagnosis not present

## 2023-06-22 DIAGNOSIS — M25662 Stiffness of left knee, not elsewhere classified: Secondary | ICD-10-CM | POA: Diagnosis not present

## 2023-06-22 DIAGNOSIS — M6281 Muscle weakness (generalized): Secondary | ICD-10-CM | POA: Diagnosis not present

## 2023-06-22 DIAGNOSIS — M542 Cervicalgia: Secondary | ICD-10-CM | POA: Diagnosis not present

## 2023-06-22 DIAGNOSIS — M25562 Pain in left knee: Secondary | ICD-10-CM | POA: Diagnosis not present

## 2023-06-22 NOTE — Therapy (Signed)
 OUTPATIENT PHYSICAL THERAPY LOWER EXTREMITY TREATMENT   Patient Name: [name] MRN: [id number] DOB:10-22-1950, 73 y.o., female Today's Date: 06/22/2023  END OF SESSION:  PT End of Session - 06/22/23 1313     Visit Number 4    Number of Visits 13    Date for PT Re-Evaluation 08/24/23    Authorization Type [employer]    PT Start Time 1311    PT Stop Time 1355    PT Time Calculation (min) 44 min    Activity Tolerance Patient tolerated treatment well    Behavior During Therapy WFL for tasks assessed/performed             Past Medical History:  Diagnosis Date   Anal fissure    Anemia    during pregnancy   Anxiety    Basal cell carcinoma    DDD (degenerative disc disease)    Diverticulosis    Hypokalemia    LBP (low back pain)    Melanoma (HCC)    Migraine    MVP (mitral valve prolapse) 1990   psa   Rosacea    Ulcer    as a child   Past Surgical History:  Procedure Laterality Date   ABDOMINAL HYSTERECTOMY  1985   BREAST BIOPSY Left    secondary to infection   I & D EXTREMITY Right 09/09/2012   Procedure: Minor IRRIGATION AND DEBRIDEMENT EXTREMITY paronychia of right thumb;  Surgeon: Kemp Patter, MD;  Location: [facility];  Service: Orthopedics;  Laterality: Right;   I & D EXTREMITY Left 01/26/2014   Procedure: MINOR IINCISION AND DRAINAGE paronychia left index finger;  Surgeon: Lyanne Sample, MD;  Location: [facility];  Service: Orthopedics;  Laterality: Left;  left index   OOPHORECTOMY Left 1994   REPAIR KNEE LIGAMENT Left 1989   TONSILLECTOMY  1955   TUBAL LIGATION     VEIN SURGERY     Patient Active Problem List   Diagnosis Date Noted   Post-viral cough syndrome 03/18/2023   Upper respiratory tract infection 02/26/2023   Nausea 02/26/2023   Pain of left hip 11/13/2022   Cervical strain 11/13/2022   Sciatica of right side 09/30/2022   B12 deficiency 09/30/2022   Anxiety 09/30/2022   Agatston coronary artery calcium  score  between 200 and 399 08/03/2022   Osteopenia of necks of both femurs 06/23/2022   Dupuytren's contracture of both hands 06/23/2022   Low TSH level 05/20/2022   Prediabetes 05/20/2022   Cellulitis of finger of right hand 09/08/2021   Infection of skin 05/13/2021   Cervicalgia 05/06/2021   Grieving 05/06/2021   Conductive hearing loss of right ear 03/04/2021   Impacted cerumen of right ear 03/04/2021   Medication side effect 03/03/2021   Excessive cerumen in right ear canal 03/03/2021   Labyrinthitis 07/30/2020   Palpitations 04/15/2020   Plantar fasciitis 04/15/2020   Dysthymia 04/15/2020   Vitamin D  deficiency 04/15/2020   Gastroesophageal reflux disease 04/15/2020   Insect bite of left foot 11/24/2019   Pustule 11/24/2019   Pain of right thumb 03/09/2019   External hemorrhoids 03/03/2018   Other chest pain 02/23/2018   Insomnia 02/24/2017   Healthcare maintenance 02/24/2017   Conjunctivitis 03/01/2014   Seborrheic keratosis, inflamed 01/05/2012   Menopausal syndrome 12/22/2011   Encounter for counseling 11/11/2011   Skin cancer 07/14/2011   Basal cell carcinoma of face 03/10/2011   Migraine 05/14/2007   IRRITABLE BOWEL SYNDROME 05/14/2007   History of mitral valve  prolapse 05/14/2007   Allergic rhinitis 04/07/2007   DERMATITIS DUE TO SOLAR RADIATION 04/07/2007   Premature beat 09/20/2006   DIVERTICULOSIS, COLON 04/13/2002    PCP: Tilmon Font, MD  REFERRING PROVIDER: Alejandro Amour, MD  REFERRING DIAG: Left knee pain, suspected meniscus teat  THERAPY DIAG:  Acute pain of left knee  Stiffness of left knee, not elsewhere classified  Difficulty in walking, not elsewhere classified  Cervicalgia  Rationale for Evaluation and Treatment: Rehabilitation  ONSET DATE: 05/12/23  SUBJECTIVE:   SUBJECTIVE STATEMENT: Doing okay, knee hurts a little Reports doing yard work about 2 weeks ago, some swelling but no pain, had some stiffness but no pain, then travel to the beach and  started unloading luggage and started having left knee pain, reports that she has been wearing flip flops a lot, had x-rays MD suspects meniscus tear, had a cortisone injection yesterday, has had arthroscopic surgery for meniscus issue in 1988  PERTINENT HISTORY: See above PAIN:  Are you having pain? Yes: NPRS scale: 0/10 Pain location: left anteromedial knee Pain description: sharp ache Aggravating factors: walking, standing, stairs, pain up to 10/10 Relieving factors: sitting and rest, ibuprofen pain can be 0/10  PRECAUTIONS: None  RED FLAGS: None   WEIGHT BEARING RESTRICTIONS: No  FALLS:  Has patient fallen in last 6 months? No  LIVING ENVIRONMENT: Lives with: lives with their family Lives in: House/apartment Stairs: Yes: Internal: 15 steps; can reach both Has following equipment at home: None  OCCUPATION: retired  PLOF: Independent and housework yardwork, walk a mile a day  PATIENT GOALS: have less pain and walk and do things without pain  NEXT MD VISIT: 4-6 weeks  OBJECTIVE:  Note: Objective measures were completed at Evaluation unless otherwise noted.  DIAGNOSTIC FINDINGS: some arthritis  PATIENT SURVEYS:  LEFS 27.5%  COGNITION: Overall cognitive status: Within functional limits for tasks assessed     SENSATION: WFL  EDEMA:  Puffy left medial knee  MUSCLE LENGTH: Tight HS, calves and piriformis  PALPATION: Mild tenderness   LOWER EXTREMITY ROM:  Active ROM Right eval Left eval  Hip flexion    Hip extension    Hip abduction    Hip adduction    Hip internal rotation    Hip external rotation    Knee flexion  115  Knee extension  0  Ankle dorsiflexion    Ankle plantarflexion    Ankle inversion    Ankle eversion     (Blank rows = not tested)  LOWER EXTREMITY MMT:  MMT Right eval Left eval  Hip flexion    Hip extension    Hip abduction    Hip adduction    Hip internal rotation    Hip external rotation    Knee flexion  3+  Knee  extension  4-  Ankle dorsiflexion    Ankle plantarflexion    Ankle inversion    Ankle eversion     (Blank rows = not tested)  LOWER EXTREMITY SPECIAL TESTS:  Knee special tests: fearful of any compression with twist of the knee, apprehensive with any meniscus testing  FUNCTIONAL TESTS:  5 times sit to stand: 18 seconds  GAIT: Distance walked: 100 feet Assistive device utilized: None Level of assistance: Complete Independence Comments: left knee stiff, does not bend the knee, antalgic on the left  TREATMENT DATE:  06/22/23 Nustep level 4 x 1 minute stopped due to soreness Bike Level 3 x 6 minutes Calf stretches 30# resisted gait all directions with some soreness HS curls 20# 2x10 Green tband clamshells Ball b/n knees squeeze HS stretches STM to the left neck area  06/15/23 Bike level 4 x 5 mintues Nustep level 4 x 5 minutes Calf stretches HS stretches LAQ with cues for TKE/VMO contraction Upper trap and levator stretches Cervical and scapular retractions Shoulder shrugs STM to the left upper trap and neck  06/08/23 Bike x 5 minutes Gait outside good pace no rest around the back parking island   Calf stretches HS stretches Piriformis and ITB stretches Quad sets STM to the calf, quad, HS and ITB  05/19/23 Evaluation and went over HEP from MD    PATIENT EDUCATION:  Education details: POC/HEP Person educated: Patient Education method: Programmer, multimedia, Facilities manager, Actor cues, Verbal cues, and Handouts Education comprehension: verbalized understanding  HOME EXERCISE PROGRAM: MD issued HEP  ASSESSMENT:  CLINICAL IMPRESSION: Patient reports that she feels that she is doing better, asks about pickle ball, however I discouraged this as she was reporting pain even with the light exercises that we were doing.  She remains tight in the calf  and HS, as well as the neck.  She said she would hold off pickle ball  Patient is a 73 y.o. female who was seen today for physical therapy evaluation and treatment for left knee pain and she tells me that the MD felt like it could be a meniscus tear.  She has good ROM but is very tight HS, ITB, calves and piriformis mms.  Apprehensive with any meniscus special testing.  HAs difficulty with lying down due to vertigo issues so we tweaked the MD HEP  OBJECTIVE IMPAIRMENTS: Abnormal gait, cardiopulmonary status limiting activity, decreased activity tolerance, decreased balance, decreased coordination, decreased endurance, decreased mobility, difficulty walking, decreased ROM, decreased safety awareness, increased edema, increased muscle spasms, impaired flexibility, improper body mechanics, and pain.   REHAB POTENTIAL: Good  CLINICAL DECISION MAKING: Stable/uncomplicated  EVALUATION COMPLEXITY: Low   GOALS: Goals reviewed with patient? Yes  SHORT TERM GOALS: Target date: 06/07/23 Independent with initial HEP Baseline: Goal status: met 06/08/23   LONG TERM GOALS: Target date: 08/24/23  Independent with advanced HEP Baseline:  Goal status: progressing 06/15/23  2.  Decrease pain with walking 50% Baseline: pain up to 10/10 Goal status:  ongoing 06/15/23  3.  Perform 5XSTS in 15 seconds Baseline:  Goal status: progressing 06/22/23  4.  Improve LEFS score by 20% points Baseline:  Goal status: INITIAL  5.  Increase knee strength to 4+/5 Baseline:  Goal status: progressing 06/15/23  PLAN:  PT FREQUENCY: 1x/week  PT DURATION: 12 weeks  PLANNED INTERVENTIONS: 97164- PT Re-evaluation, 97110-Therapeutic exercises, 97530- Therapeutic activity, 97112- Neuromuscular re-education, 97535- Self Care, 16109- Manual therapy, [phone]- Gait training, [phone]- Electrical stimulation (unattended), 97016- Vasopneumatic device, 97035- Ultrasound, 91478- Ionotophoresis 4mg /ml Dexamethasone, Patient/Family  education, Balance training, Stair training, Taping, Dry Needling, Joint mobilization, Cryotherapy, and Moist heat  PLAN FOR NEXT SESSION: Patient does travel a lot and is out of time a lot so will spread out visits, she was doing well until some yardwork and she seems to have some increased pain and tension   Darragh Nay W, PT 06/22/2023, 1:17 PM

## 2023-06-23 DIAGNOSIS — M25569 Pain in unspecified knee: Secondary | ICD-10-CM | POA: Diagnosis not present

## 2023-06-23 DIAGNOSIS — M25561 Pain in right knee: Secondary | ICD-10-CM | POA: Diagnosis not present

## 2023-06-23 DIAGNOSIS — R7303 Prediabetes: Secondary | ICD-10-CM | POA: Diagnosis not present

## 2023-06-23 DIAGNOSIS — M25562 Pain in left knee: Secondary | ICD-10-CM | POA: Diagnosis not present

## 2023-07-01 ENCOUNTER — Telehealth: Payer: Self-pay | Admitting: Cardiovascular Disease

## 2023-07-01 DIAGNOSIS — E785 Hyperlipidemia, unspecified: Secondary | ICD-10-CM

## 2023-07-01 NOTE — Telephone Encounter (Signed)
 Pt is requesting cb to see when she is supposed to have labs done

## 2023-07-01 NOTE — Telephone Encounter (Signed)
 Called patient back about her question. Patient stated she thought she was suppose to get lab work sometime around June or July. She also stated she needed an appointment with Dr. Alvis Ba, but there is nothing available at this time that works with her schedule. Patient stated she could get lab work while waiting for September and October appointments to come available. Will send message to Dr. Alvis Ba and his nurse to see what lab work patient needs and if there is any place they might be able to schedule patient.

## 2023-07-02 ENCOUNTER — Other Ambulatory Visit: Payer: Self-pay

## 2023-07-02 ENCOUNTER — Other Ambulatory Visit: Payer: Self-pay | Admitting: Family Medicine

## 2023-07-02 MED ORDER — ROSUVASTATIN CALCIUM 10 MG PO TABS: 10.0000 mg | ORAL_TABLET | Freq: Every day | ORAL | 0 refills | Status: DC

## 2023-07-02 NOTE — Telephone Encounter (Signed)
 Spoke with patient and shared Dr. Glenice Lang response:  She had excellent numbers on her lipid panel which was performed by her PCP 02/19/2023.  Does not need any labs right now, but can check a lipid panel again in the second half of July.      Lipid panel ordered and released. Patient will complete in July.   Patient states she and her husband will be out of town until mid-September. She will call next month to check back in about scheduling appt with Dr. Tita Form for her and her husband.

## 2023-07-02 NOTE — Telephone Encounter (Signed)
 She had excellent numbers on her lipid panel which was performed by her PCP 02/19/2023.  Does not need any labs right now, but can check a lipid panel again in the second half of July.

## 2023-07-02 NOTE — Telephone Encounter (Signed)
 Follow Up:   Patient wants to know if said she could get her lab work thee end of June, instead of July?

## 2023-07-02 NOTE — Telephone Encounter (Signed)
 Left voicemail to return call to office

## 2023-07-12 ENCOUNTER — Ambulatory Visit: Admitting: Physical Therapy

## 2023-07-14 ENCOUNTER — Encounter: Payer: Self-pay | Admitting: Physical Therapy

## 2023-07-14 ENCOUNTER — Ambulatory Visit: Attending: Family Medicine | Admitting: Physical Therapy

## 2023-07-14 DIAGNOSIS — M25552 Pain in left hip: Secondary | ICD-10-CM

## 2023-07-14 DIAGNOSIS — M6281 Muscle weakness (generalized): Secondary | ICD-10-CM

## 2023-07-14 DIAGNOSIS — M25662 Stiffness of left knee, not elsewhere classified: Secondary | ICD-10-CM

## 2023-07-14 DIAGNOSIS — M542 Cervicalgia: Secondary | ICD-10-CM

## 2023-07-14 DIAGNOSIS — M25562 Pain in left knee: Secondary | ICD-10-CM | POA: Diagnosis not present

## 2023-07-14 DIAGNOSIS — R262 Difficulty in walking, not elsewhere classified: Secondary | ICD-10-CM

## 2023-07-14 DIAGNOSIS — M5459 Other low back pain: Secondary | ICD-10-CM

## 2023-07-14 NOTE — Therapy (Signed)
 OUTPATIENT PHYSICAL THERAPY LOWER EXTREMITY TREATMENT   Patient Name: [name] MRN: [id number] DOB:1950/11/19, 73 y.o., female Today's Date: 07/14/2023  END OF SESSION:  PT End of Session - 07/14/23 1016     Visit Number 5    Number of Visits 13    Date for PT Re-Evaluation 08/24/23    Authorization Type [employer] 4/12    PT Start Time 1015    PT Stop Time 1100    PT Time Calculation (min) 45 min    Activity Tolerance Patient tolerated treatment well    Behavior During Therapy WFL for tasks assessed/performed             Past Medical History:  Diagnosis Date   Anal fissure    Anemia    during pregnancy   Anxiety    Basal cell carcinoma    DDD (degenerative disc disease)    Diverticulosis    Hypokalemia    LBP (low back pain)    Melanoma (HCC)    Migraine    MVP (mitral valve prolapse) 1990   psa   Rosacea    Ulcer    as a child   Past Surgical History:  Procedure Laterality Date   ABDOMINAL HYSTERECTOMY  1985   BREAST BIOPSY Left    secondary to infection   I & D EXTREMITY Right 09/09/2012   Procedure: Minor IRRIGATION AND DEBRIDEMENT EXTREMITY paronychia of right thumb;  Surgeon: Kemp Patter, MD;  Location: [facility];  Service: Orthopedics;  Laterality: Right;   I & D EXTREMITY Left 01/26/2014   Procedure: MINOR IINCISION AND DRAINAGE paronychia left index finger;  Surgeon: Lyanne Sample, MD;  Location:  SUR[facility]ERY [facility];  Service: Orthopedics;  Laterality: Left;  left index   OOPHORECTOMY Left 1994   REPAIR KNEE LIGAMENT Left 1989   TONSILLECTOMY  1955   TUBAL LIGATION     VEIN SURGERY     Patient Active Problem List   Diagnosis Date Noted   Post-viral cough syndrome 03/18/2023   Upper respiratory tract infection 02/26/2023   Nausea 02/26/2023   Pain of left hip 11/13/2022   Cervical strain 11/13/2022   Sciatica of right side 09/30/2022   B12 deficiency 09/30/2022   Anxiety 09/30/2022   Agatston coronary artery calcium   score between 200 and 399 08/03/2022   Osteopenia of necks of both femurs 06/23/2022   Dupuytren's contracture of both hands 06/23/2022   Low TSH level 05/20/2022   Prediabetes 05/20/2022   Cellulitis of finger of right hand 09/08/2021   Infection of skin 05/13/2021   Cervicalgia 05/06/2021   Grieving 05/06/2021   Conductive hearing loss of right ear 03/04/2021   Impacted cerumen of right ear 03/04/2021   Medication side effect 03/03/2021   Excessive cerumen in right ear canal 03/03/2021   Labyrinthitis 07/30/2020   Palpitations 04/15/2020   Plantar fasciitis 04/15/2020   Dysthymia 04/15/2020   Vitamin D  deficiency 04/15/2020   Gastroesophageal reflux disease 04/15/2020   Insect bite of left foot 11/24/2019   Pustule 11/24/2019   Pain of right thumb 03/09/2019   External hemorrhoids 03/03/2018   Other chest pain 02/23/2018   Insomnia 02/24/2017   Healthcare maintenance 02/24/2017   Conjunctivitis 03/01/2014   Seborrheic keratosis, inflamed 01/05/2012   Menopausal syndrome 12/22/2011   Encounter for counseling 11/11/2011   Skin cancer 07/14/2011   Basal cell carcinoma of face 03/10/2011   Migraine 05/14/2007   IRRITABLE BOWEL SYNDROME 05/14/2007   History of mitral  valve prolapse 05/14/2007   Allergic rhinitis 04/07/2007   DERMATITIS DUE TO SOLAR RADIATION 04/07/2007   Premature beat 09/20/2006   DIVERTICULOSIS, COLON 04/13/2002    PCP: Tilmon Font, MD  REFERRING PROVIDER: Alejandro Amour, MD  REFERRING DIAG: Left knee pain, suspected meniscus teat  THERAPY DIAG:  Acute pain of left knee  Stiffness of left knee, not elsewhere classified  Difficulty in walking, not elsewhere classified  Cervicalgia  Pain in left hip  Other low back pain  Muscle weakness (generalized)  Rationale for Evaluation and Treatment: Rehabilitation  ONSET DATE: 05/12/23  SUBJECTIVE:   SUBJECTIVE STATEMENT: Patient reports that about 3 days ago she went for a walk about 30 minutes total on  the beach, she reports that it was a slant, no pain during walk, reports the next days started having left groin pain.   Reports doing yard work about 2 weeks ago, some swelling but no pain, had some stiffness but no pain, then travel to the beach and started unloading luggage and started having left knee pain, reports that she has been wearing flip flops a lot, had x-rays MD suspects meniscus tear, had a cortisone injection yesterday, has had arthroscopic surgery for meniscus issue in 1988  PERTINENT HISTORY: See above PAIN:  Are you having pain? Yes: NPRS scale: 0/10 Pain location: left anteromedial knee Pain description: sharp ache Aggravating factors: walking, standing, stairs, pain up to 10/10 Relieving factors: sitting and rest, ibuprofen pain can be 0/10  PRECAUTIONS: None  RED FLAGS: None   WEIGHT BEARING RESTRICTIONS: No  FALLS:  Has patient fallen in last 6 months? No  LIVING ENVIRONMENT: Lives with: lives with their family Lives in: House/apartment Stairs: Yes: Internal: 15 steps; can reach both Has following equipment at home: None  OCCUPATION: retired  PLOF: Independent and housework yardwork, walk a mile a day  PATIENT GOALS: have less pain and walk and do things without pain  NEXT MD VISIT: 4-6 weeks  OBJECTIVE:  Note: Objective measures were completed at Evaluation unless otherwise noted.  DIAGNOSTIC FINDINGS: some arthritis  PATIENT SURVEYS:  LEFS 27.5%  COGNITION: Overall cognitive status: Within functional limits for tasks assessed     SENSATION: WFL  EDEMA:  Puffy left medial knee  MUSCLE LENGTH: Tight HS, calves and piriformis  PALPATION: Mild tenderness   LOWER EXTREMITY ROM:  Active ROM Right eval Left eval  Hip flexion    Hip extension    Hip abduction    Hip adduction    Hip internal rotation    Hip external rotation    Knee flexion  115  Knee extension  0  Ankle dorsiflexion    Ankle plantarflexion    Ankle inversion     Ankle eversion     (Blank rows = not tested)  LOWER EXTREMITY MMT:  MMT Right eval Left eval  Hip flexion    Hip extension    Hip abduction    Hip adduction    Hip internal rotation    Hip external rotation    Knee flexion  3+  Knee extension  4-  Ankle dorsiflexion    Ankle plantarflexion    Ankle inversion    Ankle eversion     (Blank rows = not tested)  LOWER EXTREMITY SPECIAL TESTS:  Knee special tests: fearful of any compression with twist of the knee, apprehensive with any meniscus testing  FUNCTIONAL TESTS:  5 times sit to stand: 18 seconds  GAIT: Distance walked: 100 feet Assistive device utilized: None  Level of assistance: Complete Independence Comments: left knee stiff, does not bend the knee, antalgic on the left                                                                                                                                TREATMENT DATE:  07/14/23 Assessment of the left groin, very tight adductors and hip flexors, very tender Any active mm will cause the tendons to hurt up to 9-10/10 Performed gentle stretching with her Added to and performed HEP STM to the left buttock, left hip flexor and left groin Ionto 80mA dose to the left adductor  06/22/23 Nustep level 4 x 1 minute stopped due to soreness Bike Level 3 x 6 minutes Calf stretches 30# resisted gait all directions with some soreness HS curls 20# 2x10 Green tband clamshells Ball b/n knees squeeze HS stretches STM to the left neck area  06/15/23 Bike level 4 x 5 mintues Nustep level 4 x 5 minutes Calf stretches HS stretches LAQ with cues for TKE/VMO contraction Upper trap and levator stretches Cervical and scapular retractions Shoulder shrugs STM to the left upper trap and neck  06/08/23 Bike x 5 minutes Gait outside good pace no rest around the back parking island   Calf stretches HS stretches Piriformis and ITB stretches Quad sets STM to the calf, quad, HS and  ITB  05/19/23 Evaluation and went over HEP from MD    PATIENT EDUCATION:  Education details: POC/HEP Person educated: Patient Education method: Programmer, multimedia, Demonstration, Actor cues, Verbal cues, and Handouts Education comprehension: verbalized understanding  HOME EXERCISE PROGRAM: Access Code: [email or web address] URL: [email or web address] Date: 07/14/2023 Prepared by: Cherylene Corrente  Exercises - Supine Hip Adductor Stretch  - 2 x daily - 7 x weekly - 1 sets - 10 reps - 15 hold - Supine Piriformis Stretch Pulling Heel to Hip  - 2 x daily - 7 x weekly - 1 sets - 10 reps - 15 hold - Supine Quadriceps Stretch with Strap on Table  - 2 x daily - 7 x weekly - 1 sets - 10 reps - 15 hold  ASSESSMENT:  CLINICAL IMPRESSION: Patient reports that she went for a walk on the beach and went further than normal, reports that night had increased groin pain, reports pain in the left groin a 6/10, at rest and then up to 10/10 with lifting leg to get in and out of car and bed.  Very tight and tender adductors, left piriformis and the left hip flexor, hopefully just a strain,   Patient is a 73 y.o. female who was seen today for physical therapy evaluation and treatment for left knee pain and she tells me that the MD felt like it could be a meniscus tear.  She has good ROM but is very tight HS, ITB, calves and piriformis mms.  Apprehensive with any meniscus special testing.  HAs difficulty with lying down due to vertigo issues so we  tweaked the MD HEP  OBJECTIVE IMPAIRMENTS: Abnormal gait, cardiopulmonary status limiting activity, decreased activity tolerance, decreased balance, decreased coordination, decreased endurance, decreased mobility, difficulty walking, decreased ROM, decreased safety awareness, increased edema, increased muscle spasms, impaired flexibility, improper body mechanics, and pain.   REHAB POTENTIAL: Good  CLINICAL DECISION MAKING: Stable/uncomplicated  EVALUATION COMPLEXITY:  Low   GOALS: Goals reviewed with patient? Yes  SHORT TERM GOALS: Target date: 06/07/23 Independent with initial HEP Baseline: Goal status: met 06/08/23   LONG TERM GOALS: Target date: 08/24/23  Independent with advanced HEP Baseline:  Goal status: progressing 06/15/23  2.  Decrease pain with walking 50% Baseline: pain up to 10/10 Goal status:  ongoing 06/15/23  3.  Perform 5XSTS in 15 seconds Baseline:  Goal status: progressing 06/22/23  4.  Improve LEFS score by 20% points Baseline:  Goal status: had a set back 07/14/23  5.  Increase knee strength to 4+/5 Baseline:  Goal status: progressing 07/14/23  PLAN:  PT FREQUENCY: 1x/week  PT DURATION: 12 weeks  PLANNED INTERVENTIONS: 97164- PT Re-evaluation, 97110-Therapeutic exercises, 97530- Therapeutic activity, 97112- Neuromuscular re-education, 97535- Self Care, 16109- Manual therapy, [phone]- Gait training, [phone]- Electrical stimulation (unattended), 97016- Vasopneumatic device, 97035- Ultrasound, 91478- Ionotophoresis 4mg /ml Dexamethasone, Patient/Family education, Balance training, Stair training, Taping, Dry Needling, Joint mobilization, Cryotherapy, and Moist heat  PLAN FOR NEXT SESSION: Patient does travel a lot and is out of time a lot so will spread out visits, she was doing well until a long walk on the beach, see of the hip calms down   Khaliq Turay W, PT 07/14/2023, 10:17 AM

## 2023-07-16 ENCOUNTER — Encounter: Payer: Self-pay | Admitting: Physical Therapy

## 2023-07-16 ENCOUNTER — Ambulatory Visit: Admitting: Physical Therapy

## 2023-07-16 DIAGNOSIS — R262 Difficulty in walking, not elsewhere classified: Secondary | ICD-10-CM

## 2023-07-16 DIAGNOSIS — M25662 Stiffness of left knee, not elsewhere classified: Secondary | ICD-10-CM | POA: Diagnosis not present

## 2023-07-16 DIAGNOSIS — M25552 Pain in left hip: Secondary | ICD-10-CM

## 2023-07-16 DIAGNOSIS — M5459 Other low back pain: Secondary | ICD-10-CM | POA: Diagnosis not present

## 2023-07-16 DIAGNOSIS — M25562 Pain in left knee: Secondary | ICD-10-CM | POA: Diagnosis not present

## 2023-07-16 DIAGNOSIS — M6281 Muscle weakness (generalized): Secondary | ICD-10-CM | POA: Diagnosis not present

## 2023-07-16 DIAGNOSIS — M542 Cervicalgia: Secondary | ICD-10-CM

## 2023-07-16 NOTE — Therapy (Signed)
 OUTPATIENT PHYSICAL THERAPY LOWER EXTREMITY TREATMENT   Patient Name: [name] MRN: [id number] DOB:[birth year], 73 y.o., female Today's Date: 07/16/2023  END OF SESSION:  PT End of Session - 07/16/23 0926     Visit Number 6    Number of Visits 13    Date for PT Re-Evaluation 08/24/23    Authorization Type [employer] 5/12    PT Start Time 0925    PT Stop Time 1011    PT Time Calculation (min) 46 min    Activity Tolerance Patient tolerated treatment well    Behavior During Therapy WFL for tasks assessed/performed             Past Medical History:  Diagnosis Date   Anal fissure    Anemia    during pregnancy   Anxiety    Basal cell carcinoma    DDD (degenerative disc disease)    Diverticulosis    Hypokalemia    LBP (low back pain)    Melanoma (HCC)    Migraine    MVP (mitral valve prolapse) 1990   psa   Rosacea    Ulcer    as a child   Past Surgical History:  Procedure Laterality Date   ABDOMINAL HYSTERECTOMY  1985   BREAST BIOPSY Left    secondary to infection   I & D EXTREMITY Right 09/09/2012   Procedure: Minor IRRIGATION AND DEBRIDEMENT EXTREMITY paronychia of right thumb;  Surgeon: Kemp Patter, MD;  Location: [facility];  Service: Orthopedics;  Laterality: Right;   I & D EXTREMITY Left 01/26/2014   Procedure: MINOR IINCISION AND DRAINAGE paronychia left index finger;  Surgeon: Lyanne Sample, MD;  Location:  [facility];  Service: Orthopedics;  Laterality: Left;  left index   OOPHORECTOMY Left 1994   REPAIR KNEE LIGAMENT Left 1989   TONSILLECTOMY  1955   TUBAL LIGATION     VEIN SURGERY     Patient Active Problem List   Diagnosis Date Noted   Post-viral cough syndrome 03/18/2023   Upper respiratory tract infection 02/26/2023   Nausea 02/26/2023   Pain of left hip 11/13/2022   Cervical strain 11/13/2022   Sciatica of right side 09/30/2022   B12 deficiency 09/30/2022   Anxiety 09/30/2022   Agatston coronary artery calcium   score between 200 and 399 08/03/2022   Osteopenia of necks of both femurs 06/23/2022   Dupuytren's contracture of both hands 06/23/2022   Low TSH level 05/20/2022   Prediabetes 05/20/2022   Cellulitis of finger of right hand 09/08/2021   Infection of skin 05/13/2021   Cervicalgia 05/06/2021   Grieving 05/06/2021   Conductive hearing loss of right ear 03/04/2021   Impacted cerumen of right ear 03/04/2021   Medication side effect 03/03/2021   Excessive cerumen in right ear canal 03/03/2021   Labyrinthitis 07/30/2020   Palpitations 04/15/2020   Plantar fasciitis 04/15/2020   Dysthymia 04/15/2020   Vitamin D  deficiency 04/15/2020   Gastroesophageal reflux disease 04/15/2020   Insect bite of left foot 11/24/2019   Pustule 11/24/2019   Pain of right thumb 03/09/2019   External hemorrhoids 03/03/2018   Other chest pain 02/23/2018   Insomnia 02/24/2017   Healthcare maintenance 02/24/2017   Conjunctivitis 03/01/2014   Seborrheic keratosis, inflamed 01/05/2012   Menopausal syndrome 12/22/2011   Encounter for counseling 11/11/2011   Skin cancer 07/14/2011   Basal cell carcinoma of face 03/10/2011   Migraine 05/14/2007   IRRITABLE BOWEL SYNDROME 05/14/2007   History of mitral  valve prolapse 05/14/2007   Allergic rhinitis 04/07/2007   DERMATITIS DUE TO SOLAR RADIATION 04/07/2007   Premature beat 09/20/2006   DIVERTICULOSIS, COLON 04/13/2002    PCP: Tilmon Font, MD  REFERRING PROVIDER: Alejandro Amour, MD  REFERRING DIAG: Left knee pain, suspected meniscus teat  THERAPY DIAG:  Acute pain of left knee  Stiffness of left knee, not elsewhere classified  Difficulty in walking, not elsewhere classified  Cervicalgia  Pain in left hip  Other low back pain  Muscle weakness (generalized)  Rationale for Evaluation and Treatment: Rehabilitation  ONSET DATE: 05/12/23  SUBJECTIVE:   SUBJECTIVE STATEMENT: Patient reports that she feels the ionto increased her HR, feels like the STM was the  best, still pretty tender and sore with motions  pain rated a 6/10 with moving the leg Reports doing yard work about 2 weeks ago, some swelling but no pain, had some stiffness but no pain, then travel to the beach and started unloading luggage and started having left knee pain, reports that she has been wearing flip flops a lot, had x-rays MD suspects meniscus tear, had a cortisone injection yesterday, has had arthroscopic surgery for meniscus issue in 1988  PERTINENT HISTORY: See above PAIN:  Are you having pain? Yes: NPRS scale: 0/10 Pain location: left anteromedial knee Pain description: sharp ache Aggravating factors: walking, standing, stairs, pain up to 10/10 Relieving factors: sitting and rest, ibuprofen pain can be 0/10  PRECAUTIONS: None  RED FLAGS: None   WEIGHT BEARING RESTRICTIONS: No  FALLS:  Has patient fallen in last 6 months? No  LIVING ENVIRONMENT: Lives with: lives with their family Lives in: House/apartment Stairs: Yes: Internal: 15 steps; can reach both Has following equipment at home: None  OCCUPATION: retired  PLOF: Independent and housework yardwork, walk a mile a day  PATIENT GOALS: have less pain and walk and do things without pain  NEXT MD VISIT: 4-6 weeks  OBJECTIVE:  Note: Objective measures were completed at Evaluation unless otherwise noted.  DIAGNOSTIC FINDINGS: some arthritis  PATIENT SURVEYS:  LEFS 27.5%  COGNITION: Overall cognitive status: Within functional limits for tasks assessed     SENSATION: WFL  EDEMA:  Puffy left medial knee  MUSCLE LENGTH: Tight HS, calves and piriformis  PALPATION: Mild tenderness   LOWER EXTREMITY ROM:  Active ROM Right eval Left eval  Hip flexion    Hip extension    Hip abduction    Hip adduction    Hip internal rotation    Hip external rotation    Knee flexion  115  Knee extension  0  Ankle dorsiflexion    Ankle plantarflexion    Ankle inversion    Ankle eversion     (Blank  rows = not tested)  LOWER EXTREMITY MMT:  MMT Right eval Left eval  Hip flexion    Hip extension    Hip abduction    Hip adduction    Hip internal rotation    Hip external rotation    Knee flexion  3+  Knee extension  4-  Ankle dorsiflexion    Ankle plantarflexion    Ankle inversion    Ankle eversion     (Blank rows = not tested)  LOWER EXTREMITY SPECIAL TESTS:  Knee special tests: fearful of any compression with twist of the knee, apprehensive with any meniscus testing  FUNCTIONAL TESTS:  5 times sit to stand: 18 seconds  GAIT: Distance walked: 100 feet Assistive device utilized: None Level of assistance: Complete Independence Comments: left knee  stiff, does not bend the knee, antalgic on the left                                                                                                                                TREATMENT DATE:  07/16/23 Nustep level 3 x 5 minutes Green tband clamshells Ball b/n knees squeeze Small bridges MFR to the left adductor musculo tendon juncture STM to the left adductor  07/14/23 Assessment of the left groin, very tight adductors and hip flexors, very tender Any active mm will cause the tendons to hurt up to 9-10/10 Performed gentle stretching with her Added to and performed HEP STM to the left buttock, left hip flexor and left groin Ionto 80mA dose to the left adductor  06/22/23 Nustep level 4 x 1 minute stopped due to soreness Bike Level 3 x 6 minutes Calf stretches 30# resisted gait all directions with some soreness HS curls 20# 2x10 Green tband clamshells Ball b/n knees squeeze HS stretches STM to the left neck area  06/15/23 Bike level 4 x 5 mintues Nustep level 4 x 5 minutes Calf stretches HS stretches LAQ with cues for TKE/VMO contraction Upper trap and levator stretches Cervical and scapular retractions Shoulder shrugs STM to the left upper trap and neck  06/08/23 Bike x 5 minutes Gait outside good pace  no rest around the back parking island   Calf stretches HS stretches Piriformis and ITB stretches Quad sets STM to the calf, quad, HS and ITB  05/19/23 Evaluation and went over HEP from MD    PATIENT EDUCATION:  Education details: POC/HEP Person educated: Patient Education method: Programmer, multimedia, Demonstration, Actor cues, Verbal cues, and Handouts Education comprehension: verbalized understanding  HOME EXERCISE PROGRAM: Access Code: [email or web address] URL: [email or web address] Date: 07/14/2023 Prepared by: Cherylene Corrente  Exercises - Supine Hip Adductor Stretch  - 2 x daily - 7 x weekly - 1 sets - 10 reps - 15 hold - Supine Piriformis Stretch Pulling Heel to Hip  - 2 x daily - 7 x weekly - 1 sets - 10 reps - 15 hold - Supine Quadriceps Stretch with Strap on Table  - 2 x daily - 7 x weekly - 1 sets - 10 reps - 15 hold  ASSESSMENT:  CLINICAL IMPRESSION: Patient continues to have some significant pain in the left groin.  She does feel like walking is easier but still hurting.  I tried to initiate some exercises and avoid pain, she still had some discomfort  Patient is a 74 y.o. female who was seen today for physical therapy evaluation and treatment for left knee pain and she tells me that the MD felt like it could be a meniscus tear.  She has good ROM but is very tight HS, ITB, calves and piriformis mms.  Apprehensive with any meniscus special testing.  HAs difficulty with lying down due to vertigo issues so we tweaked the MD HEP  OBJECTIVE IMPAIRMENTS: Abnormal gait,  cardiopulmonary status limiting activity, decreased activity tolerance, decreased balance, decreased coordination, decreased endurance, decreased mobility, difficulty walking, decreased ROM, decreased safety awareness, increased edema, increased muscle spasms, impaired flexibility, improper body mechanics, and pain.   REHAB POTENTIAL: Good  CLINICAL DECISION MAKING: Stable/uncomplicated  EVALUATION COMPLEXITY:  Low   GOALS: Goals reviewed with patient? Yes  SHORT TERM GOALS: Target date: 06/07/23 Independent with initial HEP Baseline: Goal status: met 06/08/23   LONG TERM GOALS: Target date: 08/24/23  Independent with advanced HEP Baseline:  Goal status: progressing 06/15/23  2.  Decrease pain with walking 50% Baseline: pain up to 10/10 Goal status:  ongoing 06/15/23  3.  Perform 5XSTS in 15 seconds Baseline:  Goal status: progressing 07/16/23  4.  Improve LEFS score by 20% points Baseline:  Goal status: had a set back 07/14/23  5.  Increase knee strength to 4+/5 Baseline:  Goal status: progressing 07/14/23  PLAN:  PT FREQUENCY: 1x/week  PT DURATION: 12 weeks  PLANNED INTERVENTIONS: 97164- PT Re-evaluation, 97110-Therapeutic exercises, 97530- Therapeutic activity, 97112- Neuromuscular re-education, 97535- Self Care, 16109- Manual therapy, [phone]- Gait training, [phone]- Electrical stimulation (unattended), 97016- Vasopneumatic device, 97035- Ultrasound, 91478- Ionotophoresis 4mg /ml Dexamethasone, Patient/Family education, Balance training, Stair training, Taping, Dry Needling, Joint mobilization, Cryotherapy, and Moist heat  PLAN FOR NEXT SESSION: Patient does travel a lot and is out of time a lot so will spread out visits, she was doing well until a long walk on the beach, see of the hip calms down   Mischell Branford W, PT 07/16/2023, 9:26 AM

## 2023-07-22 ENCOUNTER — Ambulatory Visit: Admitting: Physical Therapy

## 2023-07-24 ENCOUNTER — Other Ambulatory Visit: Payer: Self-pay | Admitting: Family Medicine

## 2023-07-24 ENCOUNTER — Other Ambulatory Visit: Payer: Self-pay | Admitting: Cardiovascular Disease

## 2023-07-24 DIAGNOSIS — H8309 Labyrinthitis, unspecified ear: Secondary | ICD-10-CM

## 2023-07-26 ENCOUNTER — Ambulatory Visit: Payer: Self-pay

## 2023-07-26 NOTE — Telephone Encounter (Signed)
 FYI Only or Action Required?: FYI only for provider  Patient was last seen in primary care on 06/15/2023 by Tonna Frederic, MD. Called Nurse Triage reporting Insect Bite. Symptoms began several days ago. Interventions attempted: OTC medications: See note. Symptoms are: gradually worsening.  Triage Disposition: See Physician Within 24 Hours Scheduled   Patient/caregiver understands and will follow disposition?:       Copied from CRM #[id number]. Topic: Clinical - Red Word Triage >> Jul 26, 2023  1:50 PM [name] wrote: Red Word that prompted transfer to Nurse Triage: Bite; Redness, Swollen on left arm right at elbow Reason for Disposition . [1] Red or very tender (to touch) area AND [2] getting larger over 48 hours after the bite  Answer Assessment - Initial Assessment Questions 1. TYPE of INSECT: What type of insect was it?      ----------- Unsure     2. ONSET: When did you get bitten?      -------------- Two days ago     3. LOCATION: Where is the insect bite located?      ------- L. Arm, below elbow   4. REDNESS: Is the area red or pink? If Yes, ask: What size is area of redness? (inches or cm). When did the redness start?     ---Yes    5. PAIN: Is there any pain? If Yes, ask: How bad is it?  (Scale 1-10; or mild, moderate, severe)     4-5/10     6. ITCHING: Does it itch? If Yes, ask: How bad is the itch?    - MILD: doesn't interfere with normal activities   - MODERATE-SEVERE: interferes with work, school, sleep, or other activities      Moderate-Severe     7. SWELLING: How big is the swelling? (inches, cm, or compare to coins)     ------------ Quarter     8. OTHER SYMPTOMS: Do you have any other symptoms?  (e.g., difficulty breathing, hives)     ----Tender to touch  Protocols used: Insect Bite-A-AH

## 2023-07-27 ENCOUNTER — Encounter: Payer: Self-pay | Admitting: Family Medicine

## 2023-07-27 ENCOUNTER — Encounter: Payer: Self-pay | Admitting: Cardiovascular Disease

## 2023-07-27 ENCOUNTER — Ambulatory Visit (INDEPENDENT_AMBULATORY_CARE_PROVIDER_SITE_OTHER): Admitting: Family Medicine

## 2023-07-27 VITALS — BP 114/74 | HR 74 | Temp 97.5°F | Ht 61.0 in | Wt 113.2 lb

## 2023-07-27 DIAGNOSIS — S76202A Unspecified injury of adductor muscle, fascia and tendon of left thigh, initial encounter: Secondary | ICD-10-CM

## 2023-07-27 DIAGNOSIS — S40862A Insect bite (nonvenomous) of left upper arm, initial encounter: Secondary | ICD-10-CM

## 2023-07-27 DIAGNOSIS — W57XXXA Bitten or stung by nonvenomous insect and other nonvenomous arthropods, initial encounter: Secondary | ICD-10-CM | POA: Diagnosis not present

## 2023-07-27 NOTE — Progress Notes (Signed)
 Established Patient Office Visit   Subjective:  Patient ID: GENNI BUSKE, female    DOB: [birth year]  Age: 73 y.o. MRN: [id number]  Chief Complaint  Patient presents with   Insect Bite    Insect bite on left arm x 3 days ago. Rednessm soreness when touching. NV, fever or chills.     HPI Encounter Diagnoses  Name Primary?   Insect bite of left upper extremity, initial encounter Yes   Injury of adductor muscle and tendon of left thigh, initial encounter    Pruritic bump on her left forearm.  Erythema has been decreasing.  No streaking.  It is responded to OTC hydrocortisone  cream.  [place] she developed pain in her left inner thigh.  While that seemed to improve she developed pain in the right inner thigh.  Physical therapy has been helpful.  History of mild degenerative disease in her hips.   Review of Systems  Constitutional: Negative.   HENT: Negative.    Eyes:  Negative for blurred vision, discharge and redness.  Respiratory: Negative.    Cardiovascular: Negative.   Gastrointestinal:  Negative for abdominal pain.  Genitourinary: Negative.   Musculoskeletal:  Positive for myalgias.  Skin:  Positive for itching and rash.  Neurological:  Negative for tingling, loss of consciousness and weakness.  Endo/Heme/Allergies:  Negative for polydipsia.     Current Outpatient Medications:    Ascorbic Acid (VITAMIN C) 1000 MG tablet, , Disp: , Rfl:    atenolol  (TENORMIN ) 25 MG tablet, TAKE 1/2 TABLET(12.5 MG) BY MOUTH DAILY AS NEEDED FOR PALPITATIONS, Disp: 90 tablet, Rfl: 3   calcium -vitamin D  250-100 MG-UNIT per tablet, Take 1 tablet by mouth daily., Disp: , Rfl:    Coenzyme Q10 (CO Q10) 100 MG CAPS, Take by mouth daily., Disp: , Rfl:    Doxylamine Succinate, Sleep, (SLEEP-AID PO), Take by mouth. [employer] sleep aid, Disp: , Rfl:    estradiol (ESTRACE) 0.5 MG tablet, Take 0.5 mg by mouth daily., Disp: , Rfl:    hydrocortisone  (ANUSOL -HC) 2.5 % rectal cream,  Place 1 Application rectally 2 (two) times daily., Disp: 30 g, Rfl: 0   L-LYSINE PO, Take 1 tablet by mouth 2 (two) times a week., Disp: , Rfl:    magnesium citrate SOLN, Take 1 Bottle by mouth once., Disp: , Rfl:    meclizine  (ANTIVERT ) 12.5 MG tablet, TAKE 1 TABLET BY MOUTH EVERY 6 -12 HOURS AS NEEDED FOR DIZZINESS, Disp: 30 tablet, Rfl: 1   meloxicam  (MOBIC ) 15 MG tablet, TAKE 1 TABLET BY MOUTH AS NEEDED, Disp: 30 tablet, Rfl: 1   metFORMIN  (GLUCOPHAGE -XR) 500 MG 24 hr tablet, TAKE 1 TABLET BY MOUTH AT BEDTIME, Disp: 90 tablet, Rfl: 2   Multiple Vitamin (MULTIVITAMIN) tablet, Take 1 tablet by mouth daily., Disp: , Rfl:    omeprazole  (PRILOSEC) 20 MG capsule, Take 20 mg by mouth daily., Disp: , Rfl:    Phenyleph-Diphenhyd-Hydrocod (HYDRO-DP PO), Take by mouth. Hydroeye, Disp: , Rfl:    RESTASIS 0.05 % ophthalmic emulsion, , Disp: , Rfl:    rosuvastatin  (CRESTOR ) 10 MG tablet, Take 1 tablet (10 mg total) by mouth daily. Patient must schedule and attend annual appointment for future refills 1st attempt, Disp: 30 tablet, Rfl: 0   SAMBUCOL BLACK ELDERBERRY PO, Take 1 capsule by mouth 2 (two) times a week., Disp: , Rfl:    vitamin B-12 (CYANOCOBALAMIN ) 50 MCG tablet, Take 50 mcg by mouth daily., Disp: , Rfl:  zolmitriptan  (ZOMIG -ZMT) 2.5 MG disintegrating tablet, TAKE 1 TABLET BY MOUTH AS NEEDED FOR MIGRAINE, Disp: 10 tablet, Rfl: 4   Objective:     BP 114/74 (Cuff Size: Normal)   Pulse 74   Temp (!) 97.5 F (36.4 C) (Temporal)   Ht 5' 1 (1.549 m)   Wt 113 lb 3.2 oz (51.3 kg)   SpO2 97%   BMI 21.39 kg/m    Physical Exam Constitutional:      General: She is not in acute distress.    Appearance: Normal appearance. She is not ill-appearing, toxic-appearing or diaphoretic.  HENT:     Head: Normocephalic and atraumatic.     Right Ear: External ear normal.     Left Ear: External ear normal.   Eyes:     General: No scleral icterus.       Right eye: No discharge.        Left eye: No  discharge.     Extraocular Movements: Extraocular movements intact.     Conjunctiva/sclera: Conjunctivae normal.   Pulmonary:     Effort: Pulmonary effort is normal. No respiratory distress.   Musculoskeletal:     Right hip: Normal range of motion.     Left hip: Tenderness present. No bony tenderness. Normal range of motion.       Legs:   Skin:    General: Skin is warm and dry.       Neurological:     Mental Status: She is alert and oriented to person, place, and time.   Psychiatric:        Mood and Affect: Mood normal.        Behavior: Behavior normal.      No results found for any visits on 07/27/23.    The 10-year ASCVD risk score (Arnett DK, et al., 2019) is: 14.8%    Assessment & Plan:   Insect bite of left upper extremity, initial encounter  Injury of adductor muscle and tendon of left thigh, initial encounter    Return if symptoms worsen or fail to improve.  Consider sports medicine referral if not improving.  Tonna Frederic, MD

## 2023-08-04 ENCOUNTER — Ambulatory Visit: Admitting: Physical Therapy

## 2023-08-04 ENCOUNTER — Encounter: Payer: Self-pay | Admitting: Physical Therapy

## 2023-08-04 DIAGNOSIS — M25552 Pain in left hip: Secondary | ICD-10-CM

## 2023-08-04 DIAGNOSIS — M5459 Other low back pain: Secondary | ICD-10-CM | POA: Diagnosis not present

## 2023-08-04 DIAGNOSIS — M25662 Stiffness of left knee, not elsewhere classified: Secondary | ICD-10-CM

## 2023-08-04 DIAGNOSIS — M542 Cervicalgia: Secondary | ICD-10-CM | POA: Diagnosis not present

## 2023-08-04 DIAGNOSIS — R262 Difficulty in walking, not elsewhere classified: Secondary | ICD-10-CM | POA: Diagnosis not present

## 2023-08-04 DIAGNOSIS — D225 Melanocytic nevi of trunk: Secondary | ICD-10-CM | POA: Diagnosis not present

## 2023-08-04 DIAGNOSIS — Z85828 Personal history of other malignant neoplasm of skin: Secondary | ICD-10-CM | POA: Diagnosis not present

## 2023-08-04 DIAGNOSIS — M25562 Pain in left knee: Secondary | ICD-10-CM | POA: Diagnosis not present

## 2023-08-04 DIAGNOSIS — M6281 Muscle weakness (generalized): Secondary | ICD-10-CM | POA: Diagnosis not present

## 2023-08-04 DIAGNOSIS — L821 Other seborrheic keratosis: Secondary | ICD-10-CM | POA: Diagnosis not present

## 2023-08-04 DIAGNOSIS — L570 Actinic keratosis: Secondary | ICD-10-CM | POA: Diagnosis not present

## 2023-08-04 NOTE — Therapy (Signed)
 OUTPATIENT PHYSICAL THERAPY LOWER EXTREMITY TREATMENT   Patient Name: [name] MRN: [id number] DOB:Jun 25, 1950, 73 y.o., female Today's Date: 08/04/2023  END OF SESSION:  PT End of Session - 08/04/23 1358     Visit Number 7    Number of Visits 13    Date for PT Re-Evaluation 08/24/23    Authorization Type [employer] 6/12    PT Start Time 1357    PT Stop Time 1442    PT Time Calculation (min) 45 min    Activity Tolerance Patient tolerated treatment well    Behavior During Therapy WFL for tasks assessed/performed          Past Medical History:  Diagnosis Date   Anal fissure    Anemia    during pregnancy   Anxiety    Basal cell carcinoma    DDD (degenerative disc disease)    Diverticulosis    Hypokalemia    LBP (low back pain)    Melanoma (HCC)    Migraine    MVP (mitral valve prolapse) 1990   psa   Rosacea    Ulcer    as a child   Past Surgical History:  Procedure Laterality Date   ABDOMINAL HYSTERECTOMY  1985   BREAST BIOPSY Left    secondary to infection   I & D EXTREMITY Right 09/09/2012   Procedure: Minor IRRIGATION AND DEBRIDEMENT EXTREMITY paronychia of right thumb;  Surgeon: Arley JONELLE Curia, MD;  Location: [facility];  Service: Orthopedics;  Laterality: Right;   I & D EXTREMITY Left 01/26/2014   Procedure: MINOR IINCISION AND DRAINAGE paronychia left index finger;  Surgeon: Arley Curia, MD;  Location: [facility];  Service: Orthopedics;  Laterality: Left;  left index   OOPHORECTOMY Left 1994   REPAIR KNEE LIGAMENT Left 1989   TONSILLECTOMY  1955   TUBAL LIGATION     VEIN SURGERY     Patient Active Problem List   Diagnosis Date Noted   Post-viral cough syndrome 03/18/2023   Upper respiratory tract infection 02/26/2023   Nausea 02/26/2023   Pain of left hip 11/13/2022   Cervical strain 11/13/2022   Sciatica of right side 09/30/2022   B12 deficiency 09/30/2022   Anxiety 09/30/2022   Agatston coronary artery calcium  score  between 200 and 399 08/03/2022   Osteopenia of necks of both femurs 06/23/2022   Dupuytren's contracture of both hands 06/23/2022   Low TSH level 05/20/2022   Prediabetes 05/20/2022   Cellulitis of finger of right hand 09/08/2021   Infection of skin 05/13/2021   Cervicalgia 05/06/2021   Grieving 05/06/2021   Conductive hearing loss of right ear 03/04/2021   Impacted cerumen of right ear 03/04/2021   Medication side effect 03/03/2021   Excessive cerumen in right ear canal 03/03/2021   Labyrinthitis 07/30/2020   Palpitations 04/15/2020   Plantar fasciitis 04/15/2020   Dysthymia 04/15/2020   Vitamin D  deficiency 04/15/2020   Gastroesophageal reflux disease 04/15/2020   Insect bite of left foot 11/24/2019   Pustule 11/24/2019   Pain of right thumb 03/09/2019   External hemorrhoids 03/03/2018   Other chest pain 02/23/2018   Insomnia 02/24/2017   Healthcare maintenance 02/24/2017   Conjunctivitis 03/01/2014   Seborrheic keratosis, inflamed 01/05/2012   Menopausal syndrome 12/22/2011   Encounter for counseling 11/11/2011   Skin cancer 07/14/2011   Basal cell carcinoma of face 03/10/2011   Migraine 05/14/2007   IRRITABLE BOWEL SYNDROME 05/14/2007   History of mitral valve prolapse 05/14/2007  Allergic rhinitis 04/07/2007   DERMATITIS DUE TO SOLAR RADIATION 04/07/2007   Premature beat 09/20/2006   DIVERTICULOSIS, COLON 04/13/2002    PCP: Berneta, MD  REFERRING PROVIDER: Delane, MD  REFERRING DIAG: Left knee pain, suspected meniscus teat  THERAPY DIAG:  Acute pain of left knee  Stiffness of left knee, not elsewhere classified  Difficulty in walking, not elsewhere classified  Cervicalgia  Pain in left hip  Other low back pain  Muscle weakness (generalized)  Rationale for Evaluation and Treatment: Rehabilitation  ONSET DATE: 05/12/23  SUBJECTIVE:   SUBJECTIVE STATEMENT: Patient went back to the beach she reports that she has been feeling good, less pain until  just a day or two ago, she started having the sharp pain in the groin, pubic area when getting into bed.  Very tender, will see gynecologist today Reports doing yard work about 2 weeks ago, some swelling but no pain, had some stiffness but no pain, then travel to the beach and started unloading luggage and started having left knee pain, reports that she has been wearing flip flops a lot, had x-rays MD suspects meniscus tear, had a cortisone injection yesterday, has had arthroscopic surgery for meniscus issue in 1988  PERTINENT HISTORY: See above PAIN:  Are you having pain? Yes: NPRS scale: 0/10 Pain location: left anteromedial knee Pain description: sharp ache Aggravating factors: walking, standing, stairs, pain up to 10/10 Relieving factors: sitting and rest, ibuprofen pain can be 0/10  PRECAUTIONS: None  RED FLAGS: None   WEIGHT BEARING RESTRICTIONS: No  FALLS:  Has patient fallen in last 6 months? No  LIVING ENVIRONMENT: Lives with: lives with their family Lives in: House/apartment Stairs: Yes: Internal: 15 steps; can reach both Has following equipment at home: None  OCCUPATION: retired  PLOF: Independent and housework yardwork, walk a mile a day  PATIENT GOALS: have less pain and walk and do things without pain  NEXT MD VISIT: 4-6 weeks  OBJECTIVE:  Note: Objective measures were completed at Evaluation unless otherwise noted.  DIAGNOSTIC FINDINGS: some arthritis  PATIENT SURVEYS:  LEFS 27.5%  COGNITION: Overall cognitive status: Within functional limits for tasks assessed     SENSATION: WFL  EDEMA:  Puffy left medial knee  MUSCLE LENGTH: Tight HS, calves and piriformis  PALPATION: Mild tenderness   LOWER EXTREMITY ROM:  Active ROM Right eval Left eval  Hip flexion    Hip extension    Hip abduction    Hip adduction    Hip internal rotation    Hip external rotation    Knee flexion  115  Knee extension  0  Ankle dorsiflexion    Ankle  plantarflexion    Ankle inversion    Ankle eversion     (Blank rows = not tested)  LOWER EXTREMITY MMT:  MMT Right eval Left eval  Hip flexion    Hip extension    Hip abduction    Hip adduction    Hip internal rotation    Hip external rotation    Knee flexion  3+  Knee extension  4-  Ankle dorsiflexion    Ankle plantarflexion    Ankle inversion    Ankle eversion     (Blank rows = not tested)  LOWER EXTREMITY SPECIAL TESTS:  Knee special tests: fearful of any compression with twist of the knee, apprehensive with any meniscus testing  FUNCTIONAL TESTS:  5 times sit to stand: 18 seconds  GAIT: Distance walked: 100 feet Assistive device utilized: None Level  of assistance: Complete Independence Comments: left knee stiff, does not bend the knee, antalgic on the left                                                                                                                                TREATMENT DATE:  08/04/23 Reviewed the stretches to do, spoke with patient regarding adductor strain vs pelvic floor vs hernia.  She will see her gynecologist this afternoon Performed stretches STM to the adductors MFR to adductors  07/16/23 Nustep level 3 x 5 minutes Green tband clamshells Ball b/n knees squeeze Small bridges MFR to the left adductor musculo tendon juncture STM to the left adductor  07/14/23 Assessment of the left groin, very tight adductors and hip flexors, very tender Any active mm will cause the tendons to hurt up to 9-10/10 Performed gentle stretching with her Added to and performed HEP STM to the left buttock, left hip flexor and left groin Ionto 80mA dose to the left adductor  06/22/23 Nustep level 4 x 1 minute stopped due to soreness Bike Level 3 x 6 minutes Calf stretches 30# resisted gait all directions with some soreness HS curls 20# 2x10 Green tband clamshells Ball b/n knees squeeze HS stretches STM to the left neck area  06/15/23 Bike level 4  x 5 mintues Nustep level 4 x 5 minutes Calf stretches HS stretches LAQ with cues for TKE/VMO contraction Upper trap and levator stretches Cervical and scapular retractions Shoulder shrugs STM to the left upper trap and neck  06/08/23 Bike x 5 minutes Gait outside good pace no rest around the back parking island   Calf stretches HS stretches Piriformis and ITB stretches Quad sets STM to the calf, quad, HS and ITB  05/19/23 Evaluation and went over HEP from MD    PATIENT EDUCATION:  Education details: POC/HEP Person educated: Patient Education method: Programmer, multimedia, Demonstration, Actor cues, Verbal cues, and Handouts Education comprehension: verbalized understanding  HOME EXERCISE PROGRAM: Access Code: [email or web address] URL: [email or web address] Date: 07/14/2023 Prepared by: Ozell Mainland  Exercises - Supine Hip Adductor Stretch  - 2 x daily - 7 x weekly - 1 sets - 10 reps - 15 hold - Supine Piriformis Stretch Pulling Heel to Hip  - 2 x daily - 7 x weekly - 1 sets - 10 reps - 15 hold - Supine Quadriceps Stretch with Strap on Table  - 2 x daily - 7 x weekly - 1 sets - 10 reps - 15 hold  ASSESSMENT:  CLINICAL IMPRESSION: Patient went to the beach again without much difficulty, she reports that she has been doing well until a few days ago when getting in bed she had sharp groin pain, we discussed groin strain, vs pelvic floor vs hernia, she is tender in the adductor, reports some sharp pains in the rectum and pain when sneezing.  She is very tight and tender in the adductor from origin to insertion  Patient is  a 73 y.o. female who was seen today for physical therapy evaluation and treatment for left knee pain and she tells me that the MD felt like it could be a meniscus tear.  She has good ROM but is very tight HS, ITB, calves and piriformis mms.  Apprehensive with any meniscus special testing.  HAs difficulty with lying down due to vertigo issues so we tweaked the MD  HEP  OBJECTIVE IMPAIRMENTS: Abnormal gait, cardiopulmonary status limiting activity, decreased activity tolerance, decreased balance, decreased coordination, decreased endurance, decreased mobility, difficulty walking, decreased ROM, decreased safety awareness, increased edema, increased muscle spasms, impaired flexibility, improper body mechanics, and pain.   REHAB POTENTIAL: Good  CLINICAL DECISION MAKING: Stable/uncomplicated  EVALUATION COMPLEXITY: Low   GOALS: Goals reviewed with patient? Yes  SHORT TERM GOALS: Target date: 06/07/23 Independent with initial HEP Baseline: Goal status: met 06/08/23   LONG TERM GOALS: Target date: 08/24/23  Independent with advanced HEP Baseline:  Goal status: met 08/04/23  2.  Decrease pain with walking 50% Baseline: pain up to 10/10 Goal status:  ongoing 06/15/23  3.  Perform 5XSTS in 15 seconds Baseline:  Goal status: progressing 07/16/23  4.  Improve LEFS score by 20% points Baseline:  Goal status: met 08/04/23  5.  Increase knee strength to 4+/5 Baseline:  Goal status: progressing 07/14/23  PLAN:  PT FREQUENCY: 1x/week  PT DURATION: 12 weeks  PLANNED INTERVENTIONS: 97164- PT Re-evaluation, 97110-Therapeutic exercises, 97530- Therapeutic activity, 97112- Neuromuscular re-education, 97535- Self Care, 02859- Manual therapy, [phone]- Gait training, [phone]- Electrical stimulation (unattended), 97016- Vasopneumatic device, [id number]- Ultrasound, 02966- Ionotophoresis 4mg /ml Dexamethasone, Patient/Family education, Balance training, Stair training, Taping, Dry Needling, Joint mobilization, Cryotherapy, and Moist heat  PLAN FOR NEXT SESSION: due to her being out of town until the end of July and her certification running out we will d/c   OBADIAH OZELL ORN, PT 08/04/2023, 1:58 PM

## 2023-09-01 ENCOUNTER — Encounter: Payer: Self-pay | Admitting: Cardiovascular Disease

## 2023-09-02 DIAGNOSIS — H6121 Impacted cerumen, right ear: Secondary | ICD-10-CM | POA: Diagnosis not present

## 2023-09-03 DIAGNOSIS — E785 Hyperlipidemia, unspecified: Secondary | ICD-10-CM | POA: Diagnosis not present

## 2023-09-03 LAB — LIPID PANEL
Chol/HDL Ratio: 2.1 ratio (ref 0.0–4.4)
Cholesterol, Total: 139 mg/dL (ref 100–199)
HDL: 66 mg/dL (ref >39)
LDL Chol Calc (NIH): 56 mg/dL (ref 0–99)
Triglycerides: 93 mg/dL (ref 0–149)
VLDL Cholesterol Cal: 17 mg/dL (ref 5–40)

## 2023-09-04 ENCOUNTER — Ambulatory Visit: Payer: Self-pay | Admitting: Cardiovascular Disease

## 2023-09-27 ENCOUNTER — Encounter: Payer: Self-pay | Admitting: Cardiovascular Disease

## 2023-09-29 NOTE — Telephone Encounter (Signed)
 Late entry from 8.20/25  Discuss with nurse manager-  it will be okay per Manager, for patient to use employee entrance. She wil contact 2nd floor scheduling desk . Someone will show her the stairwell to come to the 5th floor.

## 2023-10-01 ENCOUNTER — Encounter: Payer: Self-pay | Admitting: Cardiovascular Disease

## 2023-10-01 ENCOUNTER — Ambulatory Visit: Attending: Cardiovascular Disease | Admitting: Cardiovascular Disease

## 2023-10-01 VITALS — BP 114/60 | HR 72 | Ht 61.5 in | Wt 112.0 lb

## 2023-10-01 DIAGNOSIS — R7303 Prediabetes: Secondary | ICD-10-CM | POA: Diagnosis not present

## 2023-10-01 DIAGNOSIS — I4719 Other supraventricular tachycardia: Secondary | ICD-10-CM | POA: Diagnosis not present

## 2023-10-01 DIAGNOSIS — R931 Abnormal findings on diagnostic imaging of heart and coronary circulation: Secondary | ICD-10-CM | POA: Diagnosis not present

## 2023-10-01 DIAGNOSIS — I491 Atrial premature depolarization: Secondary | ICD-10-CM

## 2023-10-01 DIAGNOSIS — E785 Hyperlipidemia, unspecified: Secondary | ICD-10-CM

## 2023-10-01 DIAGNOSIS — Z79899 Other long term (current) drug therapy: Secondary | ICD-10-CM | POA: Diagnosis not present

## 2023-10-01 MED ORDER — ATENOLOL 25 MG PO TABS: 12.5000 mg | ORAL_TABLET | Freq: Every day | ORAL | 3 refills | Status: AC | PRN

## 2023-10-01 NOTE — Progress Notes (Addendum)
 Cardiology Consultation Note:    Date:  10/01/2023   ID:  IVELIZ GARAY, DOB 1950-05-30, MRN [id number]  PCP:  [name], MD   [facility]  Cardiologist:  Jerel Balding, MD NEW (remotely, Dr. Wolm Nida) Advanced Practice Provider:  No care team member to display Electrophysiologist:  None       Referring MD: [name],*   Chief Complaint  Patient presents with   Palpitations     History of Present Illness:    [name] is a 73 y.o. female with a hx of PACs and paroxysmal atrial tachycardia, borderline diabetes mellitus and elevated coronary calcium  score.  Her symptoms have really not changed since last year.  She has infrequent and brief episodes of palpitations that did not associate syncope, dyspnea or chest pain.  Sometimes she takes her half tablet of atenolol  to help suppress them.  She exercises regularly and denies problems with dyspnea or chest pain during activity.  She has not had dizziness or syncope, edema, claudication, orthopnea, PND.  Past medical history is significant for the absence of major chronic illnesses including no diabetes, hypertension, hypercholesterolemia, CAD, PVD, stroke or TIA, smoking.  She does have a family history of coronary problems.   She remains very lean with a BMI of 21, but despite this her hemoglobin A1c is 6.1%.  Her lipid parameters are excellent on treatment with rosuvastatin  (LDL 56).  She has normal renal function.  A few years ago, her coronary calcium  score was elevated at 251 (79%).  Following that result she started taking rosuvastatin  10 mg once daily.  She has some muscular tenderness in her right groin and wonders whether it could be from her statin.  It is so localized that I find that doubtful.  Offered a statin holiday, but she prefers to continue the medication uninterrupted.  In the 1990s she had been told that she has mitral valve prolapse, but this was not  confirmed on the most recent study.  She has no evidence of mitral valve prolapse on subsequent imaging studies.  She had a normal stress echo in 2020.  Past Medical History:  Diagnosis Date   Anal fissure    Anemia    during pregnancy   Anxiety    Basal cell carcinoma    DDD (degenerative disc disease)    Diverticulosis    Hypokalemia    LBP (low back pain)    Melanoma (HCC)    Migraine    MVP (mitral valve prolapse) 1990   psa   Rosacea    Ulcer    as a child    Past Surgical History:  Procedure Laterality Date   ABDOMINAL HYSTERECTOMY  1985   BREAST BIOPSY Left    secondary to infection   I & D EXTREMITY Right 09/09/2012   Procedure: Minor IRRIGATION AND DEBRIDEMENT EXTREMITY paronychia of right thumb;  Surgeon: Arley JONELLE Curia, MD;  Location: [facility];  Service: Orthopedics;  Laterality: Right;   I & D EXTREMITY Left 01/26/2014   Procedure: MINOR IINCISION AND DRAINAGE paronychia left index finger;  Surgeon: Arley Curia, MD;  Location: [facility];  Service: Orthopedics;  Laterality: Left;  left index   OOPHORECTOMY Left 1994   REPAIR KNEE LIGAMENT Left 1989   TONSILLECTOMY  1955   TUBAL LIGATION     VEIN SURGERY      Current Medications: Current Meds  Medication Sig   Coenzyme Q10 (  CO Q10) 100 MG CAPS Take by mouth daily.   Doxycycline  Hyclate 50 MG TABS Take 25 mg by mouth daily at 6 (six) AM.   Doxylamine Succinate, Sleep, (SLEEP-AID PO) Take by mouth. [employer] sleep aid   estradiol (ESTRACE) 0.5 MG tablet Take 0.5 mg by mouth daily.   hydrocortisone  (ANUSOL -HC) 2.5 % rectal cream Place 1 Application rectally 2 (two) times daily.   meclizine  (ANTIVERT ) 12.5 MG tablet TAKE 1 TABLET BY MOUTH EVERY 6 -12 HOURS AS NEEDED FOR DIZZINESS   metFORMIN  (GLUCOPHAGE -XR) 500 MG 24 hr tablet TAKE 1 TABLET BY MOUTH AT BEDTIME   Multiple Vitamin (MULTIVITAMIN) tablet Take 1 tablet by mouth daily.   nystatin cream (MYCOSTATIN) Apply 1 Application topically  daily at 6 (six) AM.   omeprazole  (PRILOSEC) 20 MG capsule Take 20 mg by mouth daily. (Patient taking differently: Take 20 mg by mouth as needed.)   RESTASIS 0.05 % ophthalmic emulsion    rosuvastatin  (CRESTOR ) 10 MG tablet Take 1 tablet (10 mg total) by mouth daily. Patient must schedule and attend annual appointment for future refills 1st attempt   SAMBUCOL BLACK ELDERBERRY PO Take 1 capsule by mouth 2 (two) times a week.   vitamin B-12 (CYANOCOBALAMIN ) 50 MCG tablet Take 50 mcg by mouth daily.     Allergies:   Codeine, Compazine, Gabapentin, Ondansetron  hcl, Other, Prednisone, Prochlorperazine edisylate, Sulfa antibiotics, and Sulfonamide derivatives   Family History: The patient's family history includes Colitis in her father; Colon polyps in her father and mother; Esophageal cancer in her maternal aunt; Heart disease in her father and mother. There is no history of Colon cancer.  ROS:   Please see the history of present illness.     All other systems reviewed and are negative.  EKGs/Labs/Other Studies Reviewed:    The following studies were reviewed today: Echo 2018, stress echo 2020 Several rhythm strips recorded by her smart watch  EKG:    EKG Interpretation Date/Time:  Friday October 01 2023 10:13:33 EDT Ventricular Rate:  72 PR Interval:  150 QRS Duration:  84 QT Interval:  362 QTC Calculation: 396 R Axis:   41  Text Interpretation: Normal sinus rhythm Possible Left atrial enlargement When compared with ECG of 22-Feb-2018 15:36, No significant change since last tracing Confirmed by Eoin Willden ([id number]) on 10/01/2023 10:23:14 AM         Recent Labs: 02/19/2023: ALT 22 06/15/2023: BUN 15; Creatinine, Ser 0.60; Hemoglobin 13.1; Platelets 309.0; Potassium 4.2; Sodium 139  Recent Lipid Panel    Component Value Date/Time   CHOL 139 09/03/2023 0803   TRIG 93 09/03/2023 0803   HDL 66 09/03/2023 0803   CHOLHDL 2.1 09/03/2023 0803   CHOLHDL 2 02/19/2023 0827   VLDL  21.4 02/19/2023 0827   LDLCALC 56 09/03/2023 0803     Risk Assessment/Calculations:       Physical Exam:    VS:  BP 114/60 (BP Location: Left Arm, Patient Position: Sitting, Cuff Size: Normal)   Pulse 72   Ht 5' 1.5 (1.562 m)   Wt 112 lb (50.8 kg)   SpO2 99%   BMI 20.82 kg/m     Wt Readings from Last 3 Encounters:  10/01/23 112 lb (50.8 kg)  07/27/23 113 lb 3.2 oz (51.3 kg)  06/15/23 113 lb (51.3 kg)   General: Alert, oriented x3, no distress, appears lean and fit, younger than stated age Head: no evidence of trauma, PERRL, EOMI, no exophtalmos or lid lag, no myxedema, no xanthelasma;  normal ears, nose and oropharynx Neck: normal jugular venous pulsations and no hepatojugular reflux; brisk carotid pulses without delay and no carotid bruits Chest: clear to auscultation, no signs of consolidation by percussion or palpation, normal fremitus, symmetrical and full respiratory excursions Cardiovascular: normal position and quality of the apical impulse, regular rhythm, normal first and second heart sounds, no murmurs, rubs or gallops Abdomen: no tenderness or distention, no masses by palpation, no abnormal pulsatility or arterial bruits, normal bowel sounds, no hepatosplenomegaly Extremities: no clubbing, cyanosis or edema; 2+ radial, ulnar and brachial pulses bilaterally; 2+ right femoral, posterior tibial and dorsalis pedis pulses; 2+ left femoral, posterior tibial and dorsalis pedis pulses; no subclavian or femoral bruits Neurological: grossly nonfocal Psych: Normal mood and affect   ASSESSMENT:    1. Atrial tachycardia (HCC)   2. Premature atrial contractions   3. Dyslipidemia   4. Medication management   5. Prediabetes   6. Elevated coronary artery calcium  score       PLAN:    In order of problems listed above:   Palpitations: She did not tolerate daily treatment with a beta-blocker, but has had satisfactory symptom control with atenolol  as needed.  Her Apple Watch  consistently shows that her palpitations are related to isolated premature atrial contractions, sometimes occurring a few times a minute.  No atrial fibrillation or atrial tachycardia has been detected.   HLP: On the current dose of rosuvastatin  although her lipid parameters are excellent.  Continue. PreDM: We discussed the concept of the glycemic index again and the need to avoid any processed starchy foods, sugary drinks and sweets.  Her hemoglobin A1c was slightly worse compared to last year. Coronary calcification: Her elevated coronary calcium  score in the 79th percentile  doubles her estimated cardiovascular risk (from around 4% to almost 8% 10-year risk).  She asked about repeating the scan.  Discussed the pathophysiology of plaque growth and plaque rupture and reaffirmed that this is useful as a prognostic, rather than a diagnostic test.  Repeating it would not help.       Medication Adjustments/Labs and Tests Ordered: Current medicines are reviewed at length with the patient today.  Concerns regarding medicines are outlined above.  Orders Placed This Encounter  Procedures   EKG 12-Lead   Meds ordered this encounter  Medications   atenolol  (TENORMIN ) 25 MG tablet    Sig: Take 0.5 tablets (12.5 mg total) by mouth daily as needed.    Dispense:  30 tablet    Refill:  3    **Patient requests 90 days supply**    Patient Instructions  Medication Instructions:  No changes *If you need a refill on your cardiac medications before your next appointment, please call your pharmacy*  Lab Work: None ordered If you have labs (blood work) drawn today and your tests are completely normal, you will receive your results only by: MyChart Message (if you have MyChart) OR A paper copy in the mail If you have any lab test that is abnormal or we need to change your treatment, we will call you to review the results.  Testing/Procedures: None ordered  Follow-Up: At [facility], you and  your health needs are our priority.  As part of our continuing mission to provide you with exceptional heart care, our providers are all part of one team.  This team includes your primary Cardiologist (physician) and Advanced Practice Providers or APPs (Physician Assistants and Nurse Practitioners) who all work together to provide you with the  care you need, when you need it.  Your next appointment:   1 year(s)  Provider:   Jerel Balding, MD    We recommend signing up for the patient portal called MyChart.  Sign up information is provided on this After Visit Summary.  MyChart is used to connect with patients for Virtual Visits (Telemedicine).  Patients are able to view lab/test results, encounter notes, upcoming appointments, etc.  Non-urgent messages can be sent to your provider as well.   To learn more about what you can do with MyChart, go to [email or web address].          Signed, Jerel Balding, MD  10/01/2023 2:07 PM    [facility]

## 2023-10-01 NOTE — Patient Instructions (Signed)

## 2023-10-20 ENCOUNTER — Ambulatory Visit: Admitting: Cardiovascular Disease

## 2023-10-25 DIAGNOSIS — H0102B Squamous blepharitis left eye, upper and lower eyelids: Secondary | ICD-10-CM | POA: Diagnosis not present

## 2023-10-25 DIAGNOSIS — H524 Presbyopia: Secondary | ICD-10-CM | POA: Diagnosis not present

## 2023-10-25 DIAGNOSIS — H10823 Rosacea conjunctivitis, bilateral: Secondary | ICD-10-CM | POA: Diagnosis not present

## 2023-10-25 DIAGNOSIS — R300 Dysuria: Secondary | ICD-10-CM | POA: Diagnosis not present

## 2023-10-25 DIAGNOSIS — B880 Other acariasis: Secondary | ICD-10-CM | POA: Diagnosis not present

## 2023-10-25 DIAGNOSIS — H0102A Squamous blepharitis right eye, upper and lower eyelids: Secondary | ICD-10-CM | POA: Diagnosis not present

## 2023-10-26 ENCOUNTER — Other Ambulatory Visit: Payer: Self-pay | Admitting: Cardiovascular Disease

## 2023-10-26 DIAGNOSIS — L821 Other seborrheic keratosis: Secondary | ICD-10-CM | POA: Diagnosis not present

## 2023-10-26 DIAGNOSIS — Z85828 Personal history of other malignant neoplasm of skin: Secondary | ICD-10-CM | POA: Diagnosis not present

## 2023-10-26 DIAGNOSIS — L738 Other specified follicular disorders: Secondary | ICD-10-CM | POA: Diagnosis not present

## 2023-10-26 DIAGNOSIS — L820 Inflamed seborrheic keratosis: Secondary | ICD-10-CM | POA: Diagnosis not present

## 2023-11-02 DIAGNOSIS — R310 Gross hematuria: Secondary | ICD-10-CM | POA: Diagnosis not present

## 2023-11-03 ENCOUNTER — Telehealth: Payer: Self-pay

## 2023-11-03 NOTE — Telephone Encounter (Signed)
 Copied from CRM [id number]. Topic: General - Other >> Nov 03, 2023  9:22 AM Henretta I wrote: Reason for CRM: Patient wanted to notify that she was seen urologists yesterday and they did a scope on her because she had blood in her urine and she would like Dr. Berneta to go into her chart and check report to see if there is anything she should do they are also trying to get her a ct scan done as well and wanted to know once nurse has received results and they have been reviewed to give her a call back.

## 2023-11-04 ENCOUNTER — Other Ambulatory Visit: Payer: Self-pay | Admitting: Urology

## 2023-11-04 DIAGNOSIS — R310 Gross hematuria: Secondary | ICD-10-CM

## 2023-11-07 ENCOUNTER — Emergency Department (HOSPITAL_BASED_OUTPATIENT_CLINIC_OR_DEPARTMENT_OTHER)
Admission: EM | Admit: 2023-11-07 | Discharge: 2023-11-07 | Disposition: A | Discharge status: Home / Self Care | Attending: Emergency Medicine | Admitting: Emergency Medicine

## 2023-11-07 ENCOUNTER — Other Ambulatory Visit: Payer: Self-pay

## 2023-11-07 ENCOUNTER — Emergency Department (HOSPITAL_BASED_OUTPATIENT_CLINIC_OR_DEPARTMENT_OTHER)

## 2023-11-07 ENCOUNTER — Encounter (HOSPITAL_BASED_OUTPATIENT_CLINIC_OR_DEPARTMENT_OTHER): Payer: Self-pay | Admitting: Emergency Medicine

## 2023-11-07 ENCOUNTER — Emergency Department (HOSPITAL_BASED_OUTPATIENT_CLINIC_OR_DEPARTMENT_OTHER): Admitting: Radiology

## 2023-11-07 DIAGNOSIS — R110 Nausea: Secondary | ICD-10-CM

## 2023-11-07 DIAGNOSIS — F419 Anxiety disorder, unspecified: Secondary | ICD-10-CM | POA: Diagnosis not present

## 2023-11-07 DIAGNOSIS — N200 Calculus of kidney: Secondary | ICD-10-CM

## 2023-11-07 DIAGNOSIS — R109 Unspecified abdominal pain: Secondary | ICD-10-CM | POA: Diagnosis not present

## 2023-11-07 DIAGNOSIS — R531 Weakness: Secondary | ICD-10-CM | POA: Diagnosis not present

## 2023-11-07 DIAGNOSIS — R319 Hematuria, unspecified: Secondary | ICD-10-CM | POA: Diagnosis not present

## 2023-11-07 DIAGNOSIS — R5383 Other fatigue: Secondary | ICD-10-CM | POA: Diagnosis not present

## 2023-11-07 DIAGNOSIS — Z90710 Acquired absence of both cervix and uterus: Secondary | ICD-10-CM | POA: Diagnosis not present

## 2023-11-07 DIAGNOSIS — N202 Calculus of kidney with calculus of ureter: Secondary | ICD-10-CM | POA: Insufficient documentation

## 2023-11-07 LAB — COMPREHENSIVE METABOLIC PANEL WITH GFR
ALT: 11 U/L (ref 0–44)
AST: 18 U/L (ref 15–41)
Albumin: 4.4 g/dL (ref 3.5–5.0)
Alkaline Phosphatase: 87 U/L (ref 38–126)
Anion gap: 13 (ref 5–15)
BUN: 11 mg/dL (ref 8–23)
CO2: 24 mmol/L (ref 22–32)
Calcium: 10.8 mg/dL — ABNORMAL HIGH (ref 8.9–10.3)
Chloride: 102 mmol/L (ref 98–111)
Creatinine, Ser: 0.71 mg/dL (ref 0.44–1.00)
GFR, Estimated: >60 mL/min (ref >60)
Glucose, Bld: 132 mg/dL — ABNORMAL HIGH (ref 70–99)
Potassium: 4.1 mmol/L (ref 3.5–5.1)
Sodium: 139 mmol/L (ref 135–145)
Total Bilirubin: 0.6 mg/dL (ref 0.0–1.2)
Total Protein: 7.9 g/dL (ref 6.5–8.1)

## 2023-11-07 LAB — URINALYSIS, ROUTINE W REFLEX MICROSCOPIC
Bilirubin Urine: NEGATIVE
Glucose, UA: NEGATIVE mg/dL
Hgb urine dipstick: NEGATIVE
Ketones, ur: NEGATIVE mg/dL
Leukocytes,Ua: NEGATIVE
Nitrite: NEGATIVE
Protein, ur: NEGATIVE mg/dL
Specific Gravity, Urine: 1.005 (ref 1.005–1.030)
pH: 6.5 (ref 5.0–8.0)

## 2023-11-07 LAB — CBC
HCT: 40 % (ref 36.0–46.0)
Hemoglobin: 13 g/dL (ref 12.0–15.0)
MCH: 30.8 pg (ref 26.0–34.0)
MCHC: 32.5 g/dL (ref 30.0–36.0)
MCV: 94.8 fL (ref 80.0–100.0)
Platelets: 348 K/uL (ref 150–400)
RBC: 4.22 MIL/uL (ref 3.87–5.11)
RDW: 12.3 % (ref 11.5–15.5)
WBC: 10.7 K/uL — ABNORMAL HIGH (ref 4.0–10.5)
nRBC: 0 % (ref 0.0–0.2)

## 2023-11-07 LAB — RESP PANEL BY RT-PCR (RSV, FLU A&B, COVID)  RVPGX2
Influenza A by PCR: NEGATIVE
Influenza B by PCR: NEGATIVE
Resp Syncytial Virus by PCR: NEGATIVE
SARS Coronavirus 2 by RT PCR: NEGATIVE

## 2023-11-07 LAB — TROPONIN T, HIGH SENSITIVITY: Troponin T High Sensitivity: <15 ng/L (ref 0–19)

## 2023-11-07 LAB — LIPASE, BLOOD: Lipase: 39 U/L (ref 11–51)

## 2023-11-07 MED ORDER — METOCLOPRAMIDE HCL 10 MG PO TABS
10.0000 mg | ORAL_TABLET | Freq: Once | ORAL | Status: AC
Start: 2023-11-07 — End: 2023-11-07
  Administered 2023-11-07: 10 mg via ORAL
  Filled 2023-11-07: qty 1

## 2023-11-07 MED ORDER — METOCLOPRAMIDE HCL 10 MG PO TABS: 10.0000 mg | ORAL_TABLET | Freq: Four times a day (QID) | ORAL | 0 refills | Status: DC

## 2023-11-07 MED ORDER — LACTATED RINGERS IV BOLUS
1000.0000 mL | Freq: Once | INTRAVENOUS | Status: AC
  Administered 2023-11-07: 1000 mL via INTRAVENOUS

## 2023-11-07 MED ORDER — IOHEXOL 300 MG/ML  SOLN
75.0000 mL | Freq: Once | INTRAMUSCULAR | Status: AC | PRN
  Administered 2023-11-07: 75 mL via INTRAVENOUS

## 2023-11-07 MED ORDER — DIPHENHYDRAMINE HCL 25 MG PO CAPS
25.0000 mg | ORAL_CAPSULE | Freq: Once | ORAL | Status: AC
  Administered 2023-11-07: 25 mg via ORAL
  Filled 2023-11-07: qty 1

## 2023-11-07 MED ORDER — ESCITALOPRAM OXALATE 5 MG PO TABS: 5.0000 mg | ORAL_TABLET | Freq: Every day | ORAL | 0 refills | Status: DC

## 2023-11-07 NOTE — ED Triage Notes (Signed)
 Pt caox4, ambulatory NAD reporting she has had episodes of hematuria, over the past few weeks with lower abd pain further reporting episodes of diaphoresis, hypotension, and nausea. Pt was initially tx for UTI but then told she does not have a UTI so to stop abx, had cystoscope done with urology, has outpatient CT scheduled for this Wed.

## 2023-11-07 NOTE — ED Notes (Signed)
 DC paperwork given and verbally understood.

## 2023-11-07 NOTE — ED Notes (Signed)
 Blood work obtained by Triage

## 2023-11-07 NOTE — Discharge Instructions (Signed)
 Your workup was reassuringly normal.   I think you would benefit from a medication such as lexapro (an SSRI) for anxiety. Please talk to your primary care doctor Dr. Tess and see if he agrees.   Update your kidney doctor about the kidney stone we found today.

## 2023-11-07 NOTE — ED Provider Notes (Signed)
 [place] EMERGENCY DEPARTMENT AT [facility] Provider Note   CSN: [id number] Arrival date & time: 11/07/23  9175     Patient presents with: Abdominal Pain   [name] is a 73 y.o. female.    Abdominal Pain  Patient is a 73 year old female with a past medical history significant for anxiety, migraines, premature beats, tripped diverticulosis, rosacea, anemia, stomach ulcers  She presents emergency room today with complaints of abdominal pain, cold sweats and fatigue  She tells me that her symptoms all began with a panic attack that began in an elevator 3 weeks ago.  Patient states that she has had some intermittent abdominal pain over the past 2 weeks seems to be in the left lower quadrant of her abdomen she has a history of diverticulosis but has never had diverticulitis she denies any blood in her stool any melena or hematochezia.  She endorses some mild nausea that seems to come and go.  She has no abdominal pain today but is uncomfortable during my exam.  She denies any episodes of vomiting.  She states she has been able to eat and drink normally, her primary concern is that she has been generally fatigued she states that she does not have any difficulty walking or doing her activities of daily living but just feels less energetic than she usually does.  She states she has had thyroid  levels checked with her PCP she was recently treated for UTI after a slightly abnormal urine sample was obtained but then discontinued after urine culture came back negative. She denies any dysuria frequency or urgency.  No chest pain or difficulty breathing no exertional symptoms such as headache chest pain or difficulty breathing.     Prior to Admission medications   Medication Sig Start Date End Date Taking? Authorizing Provider  escitalopram (LEXAPRO) 5 MG tablet Take 1 tablet (5 mg total) by mouth daily. 11/07/23  Yes Dontavious Emily S, PA  metoCLOPramide (REGLAN) 10 MG tablet Take 1  tablet (10 mg total) by mouth every 6 (six) hours. 11/07/23  Yes Monty Mccarrell S, PA  atenolol  (TENORMIN ) 25 MG tablet Take 0.5 tablets (12.5 mg total) by mouth daily as needed. 10/01/23   Croitoru, Mihai, MD  Coenzyme Q10 (CO Q10) 100 MG CAPS Take by mouth daily.    [provider]  Doxycycline  Hyclate 50 MG TABS Take 25 mg by mouth daily at 6 (six) AM.    [provider]  Doxylamine Succinate, Sleep, (SLEEP-AID PO) Take by mouth. [facility] sleep aid    [provider]  estradiol (ESTRACE) 0.5 MG tablet Take 0.5 mg by mouth daily.    [provider]  hydrocortisone  (ANUSOL -HC) 2.5 % rectal cream Place 1 Application rectally 2 (two) times daily. 04/13/23   [name], MD  meclizine  (ANTIVERT ) 12.5 MG tablet TAKE 1 TABLET BY MOUTH EVERY 6 -12 HOURS AS NEEDED FOR DIZZINESS 07/26/23   [name], MD  metFORMIN  (GLUCOPHAGE -XR) 500 MG 24 hr tablet TAKE 1 TABLET BY MOUTH AT BEDTIME 07/02/23   [name], MD  Multiple Vitamin (MULTIVITAMIN) tablet Take 1 tablet by mouth daily.    [provider]  nystatin cream (MYCOSTATIN) Apply 1 Application topically daily at 6 (six) AM. 02/25/23   [provider]  omeprazole  (PRILOSEC) 20 MG capsule Take 20 mg by mouth daily. Patient taking differently: Take 20 mg by mouth as needed.    [provider]  Phenyleph-Diphenhyd-Hydrocod (HYDRO-DP PO) Take by mouth. Hydroeye  Patient not taking: Reported on 10/01/2023    [provider]  RESTASIS 0.05 % ophthalmic emulsion  04/02/21   [provider]  rosuvastatin  (CRESTOR ) 10 MG tablet TAKE 1 TABLET BY MOUTH ONCE DAILY **MUST SCHEDULE AND ATTEND ANNUAL APPOINTMENT FOR FUTURE REFILLS 1ST ATTEMPT 10/26/23   Croitoru, Mihai, MD  SAMBUCOL BLACK ELDERBERRY PO Take 1 capsule by mouth 2 (two) times a week.    [provider]  vitamin B-12 (CYANOCOBALAMIN ) 50 MCG tablet Take 50 mcg by mouth daily.    [provider]  zolmitriptan  (ZOMIG -ZMT) 2.5 MG disintegrating tablet TAKE 1 TABLET BY MOUTH AS NEEDED FOR MIGRAINE Patient not taking: Reported on 10/01/2023 06/15/23   [name], MD    Allergies: Codeine, Compazine, Gabapentin, Ondansetron  hcl, Other, Prednisone, Prochlorperazine edisylate, Sulfa antibiotics, and Sulfonamide derivatives    Review of Systems  Gastrointestinal:  Positive for abdominal pain.    Updated Vital Signs BP (!) 141/73   Pulse 88   Temp 98.6 F (37 C) (Oral)   Resp 18   SpO2 100%   Physical Exam Vitals and nursing note reviewed.  Constitutional:      General: She is not in acute distress.    Comments: Pleasant well-appearing 73 year old.  In no acute distress.  Sitting comfortably in bed.  Able answer questions appropriately follow commands. No increased work of breathing. Speaking in full sentences.   HENT:     Head: Normocephalic and atraumatic.     Nose: Nose normal.  Eyes:     General: No scleral icterus. Cardiovascular:     Rate and Rhythm: Normal rate and regular rhythm.     Pulses: Normal pulses.     Heart sounds: Normal heart sounds.  Pulmonary:     Effort: Pulmonary effort is normal. No respiratory distress.     Breath sounds: Normal breath sounds. No wheezing.     Comments: Speaking in full sentences, no increased work of breathing, no wheezing, no tachypnea, on room air 99% Abdominal:     Palpations: Abdomen is soft.     Tenderness: There is no abdominal tenderness.     Comments: Abdomen is soft, nontender, no guarding or rebound.  No abdominal bruising no CVA tenderness.  Normal bowel sounds throughout  Musculoskeletal:     Cervical back: Normal range of motion.     Right lower leg: No edema.     Left lower leg: No edema.  Skin:    General: Skin is warm and dry.     Capillary Refill: Capillary refill takes less than 2 seconds.  Neurological:     Mental Status: She is alert. Mental status is at baseline.  Psychiatric:        Mood  and Affect: Mood is anxious.        Behavior: Behavior normal.     (all labs ordered are listed, but only abnormal results are displayed) Labs Reviewed  COMPREHENSIVE METABOLIC PANEL WITH GFR - Abnormal; Notable for the following components:      Result Value   Glucose, Bld 132 (*)    Calcium  10.8 (*)    All other components within normal limits  CBC - Abnormal; Notable for the following components:   WBC 10.7 (*)    All other components within normal limits  URINALYSIS, ROUTINE W REFLEX MICROSCOPIC - Abnormal; Notable for the following components:   Color, Urine COLORLESS (*)    All other components within normal limits  RESP PANEL BY RT-PCR (RSV,  FLU A&B, COVID)  RVPGX2  LIPASE, BLOOD  TROPONIN T, HIGH SENSITIVITY    EKG: EKG Interpretation Date/Time:  Sunday November 07 2023 09:36:00 EDT Ventricular Rate:  78 PR Interval:  149 QRS Duration:  98 QT Interval:  366 QTC Calculation: 417 R Axis:   81  Text Interpretation: Sinus rhythm Borderline right axis deviation No significant change since last tracing Confirmed by Rogelia Satterfield ([id number]) on 11/07/2023 10:02:23 AM  Radiology: CT ABDOMEN PELVIS W CONTRAST Result Date: 11/07/2023 CLINICAL DATA:  Abdominal pain and hematuria. EXAM: CT ABDOMEN AND PELVIS WITH CONTRAST TECHNIQUE: Multidetector CT imaging of the abdomen and pelvis was performed using the standard protocol following bolus administration of intravenous contrast. RADIATION DOSE REDUCTION: This exam was performed according to the departmental dose-optimization program which includes automated exposure control, adjustment of the mA and/or kV according to patient size and/or use of iterative reconstruction technique. CONTRAST:  75mL OMNIPAQUE  IOHEXOL  300 MG/ML  SOLN COMPARISON:  08/08/2019 FINDINGS: Lower chest: No acute abnormality. Hepatobiliary: No focal liver abnormality is seen. No gallstones, gallbladder wall thickening, or biliary dilatation. Pancreas:  Unremarkable. No pancreatic ductal dilatation or surrounding inflammatory changes. Spleen: Normal in size without focal abnormality. Adrenals/Urinary Tract: No adrenal masses. There is a small 2 mm calculus within the distal left ureter nearly at the ureterovesical junction. This is not causing any hydronephrosis. Two tiny nonobstructing calculi are seen within the lower pole collecting system of the right kidney. The larger measures 2 mm. No hydronephrosis bilaterally. No renal masses or ureteral abnormalities. The bladder is nearly completely decompressed. Stomach/Bowel: Bowel shows no evidence of obstruction, ileus, inflammation or lesion. The appendix is not discretely visualized. No free intraperitoneal air. Vascular/Lymphatic: No significant vascular findings are present. No enlarged abdominal or pelvic lymph nodes. Reproductive: Status post hysterectomy. No adnexal masses. Other: No abdominal wall hernia or abnormality. No abdominopelvic ascites. Musculoskeletal: No acute or significant osseous findings. IMPRESSION: 1. 2 mm calculus in the distal left ureter nearly at the ureterovesical junction. This is not causing any hydronephrosis. 2. Two tiny nonobstructing calculi in the lower pole collecting system of the right kidney. Electronically Signed   By: Marcey Moan M.D.   On: 11/07/2023 11:05   DG Chest 2 View Result Date: 11/07/2023 CLINICAL DATA:  Fatigue and weakness. EXAM: CHEST - 2 VIEW COMPARISON:  02/22/2018 FINDINGS: The heart size and mediastinal contours are within normal limits. There is no evidence of pulmonary edema, consolidation, pneumothorax, nodule or pleural fluid. The visualized skeletal structures are unremarkable. IMPRESSION: No active cardiopulmonary disease. Electronically Signed   By: Marcey Moan M.D.   On: 11/07/2023 10:50     Procedures   Medications Ordered in the ED  lactated ringers bolus 1,000 mL (0 mLs Intravenous Stopped 11/07/23 1131)  metoCLOPramide (REGLAN)  tablet 10 mg (10 mg Oral Given 11/07/23 0949)  diphenhydrAMINE (BENADRYL) capsule 25 mg (25 mg Oral Given 11/07/23 0949)  iohexol  (OMNIPAQUE ) 300 MG/ML solution 75 mL (75 mLs Intravenous Contrast Given 11/07/23 1018)    Clinical Course as of 11/07/23 1233  Sun Nov 07, 2023  1053 Pending radiology report I do not appreciate any acute abnormal findings on CT abdomen pelvis [WF]  1118 2 mm ureteral stone nearly at UVJ.  No hydronephrosis [WF]    Clinical Course User Index [WF] Neldon Hamp RAMAN, [place]  Medical Decision Making Amount and/or Complexity of Data Reviewed Labs: ordered. Radiology: ordered.  Risk Prescription drug management.   This patient presents to the ED for concern of fatigue, this involves a number of treatment options, and is a complaint that carries with it a moderate risk of complications and morbidity. A differential diagnosis was considered for the patient's symptoms which is discussed below:   The differential diagnosis of weakness includes but is not limited to neurologic causes (GBS, myasthenia gravis, CVA, MS, ALS, transverse myelitis, spinal cord injury, CVA, botulism, ) and other causes: ACS, Arrhythmia, syncope, orthostatic hypotension, sepsis, hypoglycemia, electrolyte disturbance, hypothyroidism, respiratory failure, symptomatic anemia, dehydration, heat injury, polypharmacy, malignancy.  The causes of generalized abdominal pain include but are not limited to AAA, mesenteric ischemia, appendicitis, diverticulitis, DKA, gastritis, gastroenteritis, AMI, nephrolithiasis, pancreatitis, peritonitis, adrenal insufficiency,lead poisoning, iron toxicity, intestinal ischemia, constipation, UTI,SBO/LBO, splenic rupture, biliary disease, IBD, IBS, PUD, or hepatitis. Ectopic pregnancy, ovarian torsion, PID.    Co morbidities: Discussed in HPI   Brief History:  Patient is a 73 year old female with a past medical history significant for  anxiety, migraines, premature beats, tripped diverticulosis, rosacea, anemia, stomach ulcers  She presents emergency room today with complaints of abdominal pain, cold sweats and fatigue  She tells me that her symptoms all began with a panic attack that began in an elevator 3 weeks ago.  Patient states that she has had some intermittent abdominal pain over the past 2 weeks seems to be in the left lower quadrant of her abdomen she has a history of diverticulosis but has never had diverticulitis she denies any blood in her stool any melena or hematochezia.  She endorses some mild nausea that seems to come and go.  She has no abdominal pain today but is uncomfortable during my exam.  She denies any episodes of vomiting.  She states she has been able to eat and drink normally, her primary concern is that she has been generally fatigued she states that she does not have any difficulty walking or doing her activities of daily living but just feels less energetic than she usually does.  She states she has had thyroid  levels checked with her PCP she was recently treated for UTI after a slightly abnormal urine sample was obtained but then discontinued after urine culture came back negative. She denies any dysuria frequency or urgency.  No chest pain or difficulty breathing no exertional symptoms such as headache chest pain or difficulty breathing.    EMR reviewed including pt PMHx, past surgical history and past visits to ER.   See HPI for more details   Lab Tests:  I personally reviewed all laboratory work and imaging. Metabolic panel without any acute abnormality specifically kidney function within normal limits and no significant electrolyte abnormalities. CBC without clinically significant leukocytosis or significant anemia. Lipase normal, urine without evidence of infection, troponin undetectable, respiratory panel negative  Imaging Studies:  Abnormal findings. I personally reviewed all imaging  studies. Imaging notable for CT evidence of 2 mm distal ureteral stone left side no hydronephrosis.  Otherwise unremarkable   Cardiac Monitoring:  The patient was maintained on a cardiac monitor.  I personally viewed and interpreted the cardiac monitored which showed an underlying rhythm of: NSR EKG non-ischemic   Medicines ordered:  I ordered medication including Reglan, benadryl, LR  for hydration, nausea Reevaluation of the patient after these medicines showed that the patient improved I have reviewed the patients home medicines and have made adjustments as  needed   Critical Interventions:     Consults/Attending Physician    I briefly discussed this case with my supervising physician   Reevaluation:  After the interventions noted above I re-evaluated patient and found that they have :resolved   Social Determinants of Health:      Problem List / ED Course:  Patient here with fatigue, abdominal pain, nausea although her main complaint is fatigue.  Does have a 2 mm left UVJ stone which may explain some of the hematuria that she was having recently that prompted a cystoscopy which was negative.  She is extremely anxious and after a lengthy discussion about patient's reassuring workup we discussed how she may benefit from an SSRI I recommended that she follow-up with her primary care doctor to receive this however she feels that she would like to get started on this now and I do not think this is unreasonable.  Will discharge home with Lexapro, Reglan for any nausea.   Dispostion:  After consideration of the diagnostic results and the patients response to treatment, I feel that the patent would benefit from close outpatient follow-up.  Final diagnoses:  Other fatigue  Nephrolithiasis  Anxiety  Nausea    ED Discharge Orders          Ordered    escitalopram (LEXAPRO) 5 MG tablet  Daily        11/07/23 1141    metoCLOPramide (REGLAN) 10 MG tablet  Every 6 hours         11/07/23 1232               Neldon Hamp RAMAN, [place] 11/07/23 1233    Rogelia Jerilynn RAMAN, MD 11/10/23 1926

## 2023-11-08 ENCOUNTER — Ambulatory Visit: Payer: Self-pay

## 2023-11-08 NOTE — Transitions of Care (Post Inpatient/ED Visit) (Signed)
   11/08/2023  Name: [name] MRN: [id number] DOB: 04-30-1950  Today's TOC FU Call Status: Today's TOC FU Call Status:: Successful TOC FU Call Completed TOC FU Call Complete Date: 11/08/23 Patient's Name and Date of Birth confirmed.  Transition Care Management Follow-up Telephone Call Date of Discharge: 11/07/23 Discharge Facility: [facility] ([facility] Type of Discharge: Emergency Department Reason for ED Visit: Renal Renal Diagnosis:  (Kidney Stone) How have you been since you were released from the hospital?: Same Any questions or concerns?: Yes Patient Questions/Concerns:: Discomfort, feeling anxious about this Dx.  Items Reviewed: Did you receive and understand the discharge instructions provided?: Yes Any new allergies since your discharge?: No Dietary orders reviewed?: No Do you have support at home?: Yes  Medications Reviewed Today: Medications Reviewed Today   Medications were not reviewed in this encounter     Home Care and Equipment/Supplies: Were Home Health Services Ordered?: NA Any new equipment or medical supplies ordered?: NA  Functional Questionnaire: Do you need assistance with bathing/showering or dressing?: No Do you need assistance with meal preparation?: No Do you need assistance with eating?: No Do you have difficulty maintaining continence: No Do you need assistance with getting out of bed/getting out of a chair/moving?: No Do you have difficulty managing or taking your medications?: No  Follow up appointments reviewed: PCP Follow-up appointment confirmed?: Yes Date of PCP follow-up appointment?: 11/11/23 Follow-up Provider: [facility] Follow-up appointment confirmed?: NA Do you need transportation to your follow-up appointment?: No Do you understand care options if your condition(s) worsen?: Yes-patient verbalized understanding    SIGNATURE Dedra Sorenson, BSN, RN

## 2023-11-08 NOTE — Telephone Encounter (Signed)
 FYI Only or Action Required?: Action required by provider: request for appointment and update on patient condition.  Patient was last seen in primary care on 07/27/2023 by [name], MD.  Called Nurse Triage reporting Hospitalization Follow-up.  Symptoms began several days ago.  Interventions attempted: Rest, hydration, or home remedies.  Symptoms are: gradually improving.  Triage Disposition: Home Care  Patient/caregiver understands and will follow disposition?: No, refuses disposition  Message from Deaijah H sent at 11/08/2023  8:46 AM EDT  Hospital follow up - kidney stones, weak, steady and heart racing & previously had blood in urine.   Reason for Disposition  [1] Kidney stone diagnosed by doctor (or NP/PA) AND [2] normal symptoms (e.g., nausea, pain) AND [3] no complications  Answer Assessment - Initial Assessment Questions Additional info: 1) first available hospital follow up with pcp is on October 10th, patient states she will be out of town that date and also does not want to wait that amount of time for a hospital follow up visit. Offered follow up visit with another provider but she prefers to follow up with pcp and requesting work in appointment. She suggested work in appointment on Monday oct 6th around 10-11am since her husband has an appointment with Dr. Berneta that day. Please advise.  2) New medications received Reglan and Lexapro (for anxiety)    1. MAIN CONCERN OR SYMPTOM:  What is your main concern right now? What question do you have? What's the main symptom you're worried about? (e.g., blood in urine, flank pain)     Left sided kidney stone  2. ONSET: When did the  fatigue and abd cramps  start?     Several days 3. BETTER-SAME-WORSE: Are you getting better, staying the same, or getting worse compared to how you felt at your last visit to the doctor (most recent medical visit)?     better 4. VISIT DATE: When were you seen? (Date)      11/07/23 at [facility] ER.  5. VISIT DOCTOR: What is the name of the doctor (or NP/PA) taking care of you now?      6. VISIT DIAGNOSIS:  What was the main symptom or problem that you were seen for? Were you given a diagnosis?      Fatigue-dx. kidney stone 7. TREATMENT: Did you have any treatment for your kidney stone? (e.g., none, doctor exam, lithotripsy, medicines, stent) If Yes, ask: When did you have this treatment?     Reglan for nausea 8. NEXT APPOINTMENT: Do you have a follow-up appointment with your doctor?     Not scheduled at this time, requesting work in 9. PAIN: Is there any pain? If Yes, ask: How bad is it?  (Scale 0-10; or none, mild, moderate, severe)     Intermittent abdominal cramping 10. FEVER: Do you have a fever? If Yes, ask: What is it, how was it measured, and when did it start?       denies 11. OTHER SYMPTOMS: Do you have any other symptoms? (e.g., abdomen pain, blood in urine, vomiting)        Fatigue  Protocols used: Kidney Stone Follow-up Call-[facility]

## 2023-11-08 NOTE — Telephone Encounter (Signed)
 Pt scheduled for HOSP FU with PCP on Thursday, 11/11/23 at 4:00pm. TOC call completed.

## 2023-11-10 ENCOUNTER — Other Ambulatory Visit

## 2023-11-11 ENCOUNTER — Encounter (HOSPITAL_BASED_OUTPATIENT_CLINIC_OR_DEPARTMENT_OTHER): Payer: Self-pay

## 2023-11-11 ENCOUNTER — Inpatient Hospital Stay: Admitting: Family Medicine

## 2023-11-11 ENCOUNTER — Other Ambulatory Visit: Payer: Self-pay

## 2023-11-11 ENCOUNTER — Telehealth: Payer: Self-pay | Admitting: Family Medicine

## 2023-11-11 ENCOUNTER — Emergency Department (HOSPITAL_BASED_OUTPATIENT_CLINIC_OR_DEPARTMENT_OTHER)
Admission: EM | Admit: 2023-11-11 | Discharge: 2023-11-11 | Disposition: A | Discharge status: Home / Self Care | Attending: Emergency Medicine | Admitting: Emergency Medicine

## 2023-11-11 DIAGNOSIS — R109 Unspecified abdominal pain: Secondary | ICD-10-CM | POA: Diagnosis present

## 2023-11-11 DIAGNOSIS — N23 Unspecified renal colic: Secondary | ICD-10-CM | POA: Insufficient documentation

## 2023-11-11 LAB — BASIC METABOLIC PANEL WITH GFR
Anion gap: 10 (ref 5–15)
BUN: 16 mg/dL (ref 8–23)
CO2: 27 mmol/L (ref 22–32)
Calcium: 11 mg/dL — ABNORMAL HIGH (ref 8.9–10.3)
Chloride: 103 mmol/L (ref 98–111)
Creatinine, Ser: 0.81 mg/dL (ref 0.44–1.00)
GFR, Estimated: >60 mL/min (ref >60)
Glucose, Bld: 112 mg/dL — ABNORMAL HIGH (ref 70–99)
Potassium: 3.9 mmol/L (ref 3.5–5.1)
Sodium: 140 mmol/L (ref 135–145)

## 2023-11-11 LAB — URINALYSIS, W/ REFLEX TO CULTURE (INFECTION SUSPECTED)
Bilirubin Urine: NEGATIVE
Glucose, UA: NEGATIVE mg/dL
Ketones, ur: NEGATIVE mg/dL
Leukocytes,Ua: NEGATIVE
Nitrite: NEGATIVE
Protein, ur: NEGATIVE mg/dL
Specific Gravity, Urine: 1.005 (ref 1.005–1.030)
pH: 6 (ref 5.0–8.0)

## 2023-11-11 MED ORDER — KETOROLAC TROMETHAMINE 15 MG/ML IJ SOLN
15.0000 mg | Freq: Once | INTRAMUSCULAR | Status: AC
  Administered 2023-11-11: 15 mg via INTRAVENOUS
  Filled 2023-11-11: qty 1

## 2023-11-11 NOTE — ED Notes (Signed)
 Pt d/c instructions, medications, and follow-up care reviewed with pt. Pt verbalized understanding and had no further questions at time of d/c. Pt CA&Ox4, ambulatory, and in NAD at time of d/c

## 2023-11-11 NOTE — Discharge Instructions (Signed)
 Was a pleasure taking care of you today.  You are seen for pain in the left side related to kidney stone.  You have no blood in your urine or sign of UTI.  Your kidney function was normal, your calcium  is slightly high and needs to be rechecked by your PCP.  Continue to push fluids, take medications as prescribed and come back to the ER if you have new or worsening symptoms, otherwise follow-up with urology.

## 2023-11-11 NOTE — Telephone Encounter (Signed)
 Pt is a same day cancellation for a hosp f/up with Dr Berneta for 11/11/23, because of this reason.

## 2023-11-11 NOTE — ED Provider Notes (Signed)
 [place] EMERGENCY DEPARTMENT AT [facility] Provider Note   CSN: [id number] Arrival date & time: 11/11/23  1320     Patient presents with: Flank Pain   [name] is a 73 y.o. female.  She has a history of migraines, prediabetes, diagnosed with kidney stone on 9/28 at left UVJ.  She states she follow-up with her PCP who prescribed tamsulosin which she had made her heartbeat very fast so they switch her to another similar medication that she does not know what it was. Was in today because her pain returned in the left lower abdomen while eating lunch it was severe with nausea, states that it is improved now denies fever or chills, no vomiting.  She states she feels like she is urinating frequently but small amounts only.    Flank Pain       Prior to Admission medications   Medication Sig Start Date End Date Taking? Authorizing Provider  alfuzosin (UROXATRAL) 10 MG 24 hr tablet Take 10 mg by mouth daily. 11/08/23  Yes [provider]  ketorolac  (TORADOL ) 10 MG tablet Take 10 mg by mouth 3 (three) times daily. 11/08/23  Yes [provider]  phenazopyridine (PYRIDIUM) 200 MG tablet Take 200 mg by mouth 3 (three) times daily. 11/04/23  Yes [provider]  silodosin (RAPAFLO) 4 MG CAPS capsule Take 4 mg by mouth daily. 11/09/23  Yes [provider]  atenolol  (TENORMIN ) 25 MG tablet Take 0.5 tablets (12.5 mg total) by mouth daily as needed. 10/01/23   Croitoru, Mihai, MD  Coenzyme Q10 (CO Q10) 100 MG CAPS Take by mouth daily.    [provider]  Doxycycline  Hyclate 50 MG TABS Take 25 mg by mouth daily at 6 (six) AM.    [provider]  Doxylamine Succinate, Sleep, (SLEEP-AID PO) Take by mouth. [facility] sleep aid    [provider]  escitalopram (LEXAPRO) 5 MG tablet Take 1 tablet (5 mg total) by mouth daily. 11/07/23   Neldon Hamp RAMAN, PA  estradiol (ESTRACE) 0.5 MG tablet Take 0.5 mg by mouth daily.    [provider]  hydrocortisone  (ANUSOL -HC) 2.5 % rectal cream Place 1 Application rectally 2 (two) times daily. 04/13/23   [name], MD  meclizine  (ANTIVERT ) 12.5 MG tablet TAKE 1 TABLET BY MOUTH EVERY 6 -12 HOURS AS NEEDED FOR DIZZINESS 07/26/23   [name], MD  metFORMIN  (GLUCOPHAGE -XR) 500 MG 24 hr tablet TAKE 1 TABLET BY MOUTH AT BEDTIME 07/02/23   [name], MD  metoCLOPramide (REGLAN) 10 MG tablet Take 1 tablet (10 mg total) by mouth every 6 (six) hours. 11/07/23   Neldon Hamp RAMAN, PA  Multiple Vitamin (MULTIVITAMIN) tablet Take 1 tablet by mouth daily.    [provider]  nystatin cream (MYCOSTATIN) Apply 1 Application topically daily at 6 (six) AM. 02/25/23   [provider]  omeprazole  (PRILOSEC) 20 MG capsule Take 20 mg by mouth daily. Patient taking differently: Take 20 mg by mouth as needed.    [provider]  RESTASIS 0.05 % ophthalmic emulsion  04/02/21   [provider]  rosuvastatin  (CRESTOR ) 10 MG tablet TAKE 1 TABLET BY MOUTH ONCE DAILY **MUST SCHEDULE AND ATTEND ANNUAL APPOINTMENT FOR FUTURE REFILLS 1ST ATTEMPT 10/26/23   Croitoru, Mihai, MD  SAMBUCOL BLACK ELDERBERRY PO Take 1 capsule by mouth 2 (two) times a week.    [provider]  vitamin B-12 (CYANOCOBALAMIN ) 50 MCG tablet Take 50 mcg by  mouth daily.    [provider]  zolmitriptan  (ZOMIG -ZMT) 2.5 MG disintegrating tablet TAKE 1 TABLET BY MOUTH AS NEEDED FOR MIGRAINE Patient not taking: Reported on 10/01/2023 06/15/23   [name], MD    Allergies: Codeine, Compazine, Gabapentin, Ondansetron  hcl, Other, Prednisone, Prochlorperazine edisylate, Sulfa antibiotics, and Sulfonamide derivatives    Review of Systems  Genitourinary:  Positive for flank pain.    Updated Vital Signs BP 127/66   Pulse 63   Temp 97.7 F (36.5 C) (Oral)   Resp 18   Ht 5' 1.5 (1.562 m)   Wt 49.9 kg   SpO2 100%   BMI 20.45 kg/m   Physical  Exam Vitals and nursing note reviewed.  Constitutional:      General: She is not in acute distress.    Appearance: She is well-developed.  HENT:     Head: Normocephalic and atraumatic.     Mouth/Throat:     Mouth: Mucous membranes are moist.  Eyes:     Extraocular Movements: Extraocular movements intact.     Conjunctiva/sclera: Conjunctivae normal.     Pupils: Pupils are equal, round, and reactive to light.  Cardiovascular:     Rate and Rhythm: Normal rate and regular rhythm.     Heart sounds: No murmur heard. Pulmonary:     Effort: Pulmonary effort is normal. No respiratory distress.     Breath sounds: Normal breath sounds.  Abdominal:     Palpations: Abdomen is soft.     Tenderness: There is no abdominal tenderness. There is no right CVA tenderness, left CVA tenderness, guarding or rebound.  Musculoskeletal:        General: No swelling.     Cervical back: Neck supple.  Skin:    General: Skin is warm and dry.     Capillary Refill: Capillary refill takes less than 2 seconds.  Neurological:     General: No focal deficit present.     Mental Status: She is alert and oriented to person, place, and time.  Psychiatric:        Mood and Affect: Mood normal.     (all labs ordered are listed, but only abnormal results are displayed) Labs Reviewed  URINALYSIS, W/ REFLEX TO CULTURE (INFECTION SUSPECTED) - Abnormal; Notable for the following components:      Result Value   Color, Urine COLORLESS (*)    Hgb urine dipstick SMALL (*)    Bacteria, UA RARE (*)    All other components within normal limits  BASIC METABOLIC PANEL WITH GFR - Abnormal; Notable for the following components:   Glucose, Bld 112 (*)    Calcium  11.0 (*)    All other components within normal limits    EKG: None  Radiology: No results found.   Procedures   Medications Ordered in the ED  ketorolac  (TORADOL ) 15 MG/ML injection 15 mg (15 mg Intravenous Given 11/11/23 1502)                                     Medical Decision Making This patient presents to the ED for concern of left lower abdominal pain, this involves an extensive number of treatment options, and is a complaint that carries with it a high risk of complications and morbidity.  The differential diagnosis includes renal colic, UTI, urinary retention,   Co morbidities that complicate the patient evaluation Migraines, vitamin D  deficiency   Additional history  obtained:  Additional history obtained from EMR External records from outside source obtained and reviewed including prior notes and labs, and recent CT scan of abdomen/pelvis   Lab Tests:  I Ordered, and personally interpreted labs.  The pertinent results include: UA with no leukocytes, no red blood cells, no white blood cells for bacteria BMP with calcium  slightly elevated 11, normal kidney function, normal sodium, potassium and chloride     Problem List / ED Course / Critical interventions / Medication management  Left pelvic pain-this correlates with patient's 2 mm nonobstructing stone to the left distal ureter several days ago.  Pain improved in the ED after Toradol .  She is in no distress.  Not currently having any nausea.  States she has nausea medication and Toradol  at home which she had not taken.  She is already established with urology to follow-up and has an appointment scheduled.  We discussed stone was only 2 mm will likely pass on its own.  She declined offer for any stronger pain medicine she was worried about slightly elevated calcium  on her last blood test and wanted this to be rechecked today, it is 11, patient does not appear dehydrated, discussed with her that she needs to follow-up closely with PCP for further evaluation of this is not emergent at this time. I ordered medication including Toradol  for abdominal pain Reevaluation of the patient after these medicines showed that the patient improved I have reviewed the patients home medicines and have  made adjustments as needed      Amount and/or Complexity of Data Reviewed Labs: ordered.  Risk Prescription drug management.        Final diagnoses:  Renal colic on left side  Hypercalcemia    ED Discharge Orders     None          [name] 11/11/23 1645    Rogelia Jerilynn RAMAN, MD 11/11/23 1812

## 2023-11-11 NOTE — ED Triage Notes (Signed)
 Pt to er, pt states that she was here on Sunday, states that she was d/x with a kidney stone, states that she is here for another episode of pain and some nausea.

## 2023-11-11 NOTE — Telephone Encounter (Signed)
 Copied from CRM [id number]. Topic: Appointments - Appointment Cancel/Reschedule >> Nov 11, 2023  2:31 PM Alfonso HERO wrote: Patient husband called to inform that the patient is in the ER and had to cancel todays appt. >> Nov 11, 2023  2:42 PM Terrill B wrote: Pt is in the hospital

## 2023-11-15 ENCOUNTER — Ambulatory Visit: Admitting: Family Medicine

## 2023-11-15 ENCOUNTER — Encounter: Payer: Self-pay | Admitting: Family Medicine

## 2023-11-15 VITALS — BP 116/62 | HR 70 | Temp 96.9°F | Ht 61.0 in | Wt 111.8 lb

## 2023-11-15 DIAGNOSIS — F419 Anxiety disorder, unspecified: Secondary | ICD-10-CM | POA: Diagnosis not present

## 2023-11-15 DIAGNOSIS — N200 Calculus of kidney: Secondary | ICD-10-CM | POA: Diagnosis not present

## 2023-11-15 DIAGNOSIS — R7303 Prediabetes: Secondary | ICD-10-CM | POA: Diagnosis not present

## 2023-11-15 LAB — URINALYSIS, ROUTINE W REFLEX MICROSCOPIC
Bilirubin Urine: NEGATIVE
Ketones, ur: NEGATIVE
Leukocytes,Ua: NEGATIVE
Nitrite: NEGATIVE
Specific Gravity, Urine: <=1.005 — AB (ref 1.000–1.030)
Total Protein, Urine: NEGATIVE
Urine Glucose: NEGATIVE
Urobilinogen, UA: 0.2 (ref 0.0–1.0)
pH: 7 (ref 5.0–8.0)

## 2023-11-15 LAB — BASIC METABOLIC PANEL WITH GFR
BUN: 11 mg/dL (ref 6–23)
CO2: 28 meq/L (ref 19–32)
Calcium: 10.2 mg/dL (ref 8.4–10.5)
Chloride: 102 meq/L (ref 96–112)
Creatinine, Ser: 0.57 mg/dL (ref 0.40–1.20)
GFR: 89.06 mL/min (ref >60.00)
Glucose, Bld: 119 mg/dL — ABNORMAL HIGH (ref 70–99)
Potassium: 3.8 meq/L (ref 3.5–5.1)
Sodium: 142 meq/L (ref 135–145)

## 2023-11-15 LAB — HEMOGLOBIN A1C: Hgb A1c MFr Bld: 6.2 % (ref 4.6–6.5)

## 2023-11-15 LAB — TSH: TSH: 2.5 u[IU]/mL (ref 0.35–5.50)

## 2023-11-15 MED ORDER — ESCITALOPRAM OXALATE 5 MG PO TABS: 5.0000 mg | ORAL_TABLET | Freq: Every day | ORAL | 2 refills | Status: DC

## 2023-11-15 NOTE — Progress Notes (Signed)
 Established Patient Office Visit   Subjective:  Patient ID: [name], female    DOB: Oct 30, 1950  Age: 73 y.o. MRN: [id number]  Chief Complaint  Patient presents with   Hospitalization Follow-up    Pt went back to the ED on 10/2 for LLQ abdominal pain. Pt states she believes she passed a kidney stone. Pt states she feel better but still has some pressure.     HPI Encounter Diagnoses  Name Primary?   Anxiety Yes   Prediabetes    Hypercalcemia    Renal lithiasis    For follow-up of emergency room visit and above.  Believes that she may have passed a kidney stone.  There has been no blood in her urine.  CT taken at her recent emergency room visit was reviewed.  Follow-up of hypercalcemia today.  Follow-up of prediabetes.  Continues with metformin .  ER provider started her on Lexapro at low-dose milligrams.   Review of Systems  Constitutional: Negative.   HENT: Negative.    Eyes:  Negative for blurred vision, discharge and redness.  Respiratory: Negative.    Cardiovascular: Negative.   Gastrointestinal:  Negative for abdominal pain.  Genitourinary: Negative.   Musculoskeletal: Negative.  Negative for myalgias.  Skin:  Negative for rash.  Neurological:  Negative for tingling, loss of consciousness and weakness.  Endo/Heme/Allergies:  Negative for polydipsia.  Psychiatric/Behavioral:  The patient is nervous/anxious.      Current Outpatient Medications:    atenolol  (TENORMIN ) 25 MG tablet, Take 0.5 tablets (12.5 mg total) by mouth daily as needed., Disp: 30 tablet, Rfl: 3   Coenzyme Q10 (CO Q10) 100 MG CAPS, Take by mouth daily., Disp: , Rfl:    Doxycycline  Hyclate 50 MG TABS, Take 25 mg by mouth daily at 6 (six) AM., Disp: , Rfl:    Doxylamine Succinate, Sleep, (SLEEP-AID PO), Take by mouth. [facility] sleep aid, Disp: , Rfl:    estradiol (ESTRACE) 0.5 MG tablet, Take 0.5 mg by mouth daily., Disp: , Rfl:    hydrocortisone  (ANUSOL -HC) 2.5 % rectal cream, Place 1 Application  rectally 2 (two) times daily., Disp: 30 g, Rfl: 0   meclizine  (ANTIVERT ) 12.5 MG tablet, TAKE 1 TABLET BY MOUTH EVERY 6 -12 HOURS AS NEEDED FOR DIZZINESS, Disp: 30 tablet, Rfl: 1   metFORMIN  (GLUCOPHAGE -XR) 500 MG 24 hr tablet, TAKE 1 TABLET BY MOUTH AT BEDTIME, Disp: 90 tablet, Rfl: 2   Multiple Vitamin (MULTIVITAMIN) tablet, Take 1 tablet by mouth daily., Disp: , Rfl:    nystatin cream (MYCOSTATIN), Apply 1 Application topically daily at 6 (six) AM., Disp: , Rfl:    omeprazole  (PRILOSEC) 20 MG capsule, Take 20 mg by mouth daily., Disp: , Rfl:    phenazopyridine (PYRIDIUM) 200 MG tablet, Take 200 mg by mouth 3 (three) times daily., Disp: , Rfl:    RESTASIS 0.05 % ophthalmic emulsion, , Disp: , Rfl:    rosuvastatin  (CRESTOR ) 10 MG tablet, TAKE 1 TABLET BY MOUTH ONCE DAILY **MUST SCHEDULE AND ATTEND ANNUAL APPOINTMENT FOR FUTURE REFILLS 1ST ATTEMPT, Disp: 90 tablet, Rfl: 3   SAMBUCOL BLACK ELDERBERRY PO, Take 1 capsule by mouth 2 (two) times a week., Disp: , Rfl:    silodosin (RAPAFLO) 4 MG CAPS capsule, Take 4 mg by mouth daily., Disp: , Rfl:    vitamin B-12 (CYANOCOBALAMIN ) 50 MCG tablet, Take 50 mcg by mouth daily., Disp: , Rfl:    alfuzosin (UROXATRAL) 10 MG 24 hr tablet, Take 10 mg by mouth daily. (Patient not  taking: Reported on 11/15/2023), Disp: , Rfl:    escitalopram (LEXAPRO) 5 MG tablet, Take 1 tablet (5 mg total) by mouth daily., Disp: 45 tablet, Rfl: 2   zolmitriptan  (ZOMIG -ZMT) 2.5 MG disintegrating tablet, TAKE 1 TABLET BY MOUTH AS NEEDED FOR MIGRAINE (Patient not taking: Reported on 11/15/2023), Disp: 10 tablet, Rfl: 4   Objective:     BP 116/62 (BP Location: Right Arm, Patient Position: Sitting, Cuff Size: Normal)   Pulse 70   Temp (!) 96.9 F (36.1 C) (Temporal)   Ht 5' 1 (1.549 m)   Wt 111 lb 12.8 oz (50.7 kg)   SpO2 98%   BMI 21.12 kg/m  Wt Readings from Last 3 Encounters:  11/15/23 111 lb 12.8 oz (50.7 kg)  11/11/23 110 lb (49.9 kg)  10/01/23 112 lb (50.8 kg)       Physical Exam Constitutional:      General: She is not in acute distress.    Appearance: Normal appearance. She is not ill-appearing, toxic-appearing or diaphoretic.  HENT:     Head: Normocephalic and atraumatic.     Right Ear: External ear normal.     Left Ear: External ear normal.  Eyes:     General: No scleral icterus.       Right eye: No discharge.        Left eye: No discharge.     Extraocular Movements: Extraocular movements intact.     Conjunctiva/sclera: Conjunctivae normal.  Pulmonary:     Effort: Pulmonary effort is normal. No respiratory distress.  Skin:    General: Skin is warm and dry.  Neurological:     Mental Status: She is alert and oriented to person, place, and time.  Psychiatric:        Mood and Affect: Mood normal.        Behavior: Behavior normal.      No results found for any visits on 11/15/23.    The 10-year ASCVD risk score (Arnett DK, et al., 2019) is: 17.1%    Assessment & Plan:   Anxiety -     Escitalopram Oxalate; Take 1 tablet (5 mg total) by mouth daily.  Dispense: 45 tablet; Refill: 2 -     TSH  Prediabetes -     Basic metabolic panel with GFR -     Hemoglobin A1c -     Escitalopram Oxalate; Take 1 tablet (5 mg total) by mouth daily.  Dispense: 45 tablet; Refill: 2  Hypercalcemia -     Parathyroid hormone, intact (no Ca) -     Basic metabolic panel with GFR  Renal lithiasis -     Urinalysis, Routine w reflex microscopic    Return in about 8 weeks (around 01/10/2024).  Continue all medications as directed.  Please start the Lexapro and follow-up in 8 weeks.  Elsie Sim Lent, MD

## 2023-11-16 ENCOUNTER — Ambulatory Visit: Payer: Self-pay | Admitting: Family Medicine

## 2023-11-16 LAB — PARATHYROID HORMONE, INTACT (NO CA): PTH: 48 pg/mL (ref 16–77)

## 2023-11-16 NOTE — Telephone Encounter (Signed)
 Copied from CRM #[id number]. Topic: Clinical - Lab/Test Results >> Nov 16, 2023 10:18 AM Rea ORN wrote: Reason for CRM: pt would like nurse/CMA to call back to once all results for labs are in. She is also asking for UA and lab results to be sent to her urologist Dr. Shane, cardiologist Dr. Francyne and gyno Dr. Rox.

## 2023-11-18 ENCOUNTER — Ambulatory Visit: Admitting: Family Medicine

## 2023-11-22 NOTE — Telephone Encounter (Signed)
 Copied from CRM [id number]. Topic: Medical Record Request - Records Request >> Nov 22, 2023 12:00 PM Viola F wrote: Patient would like her most recent lab results sent to 3 of her specialist:  Urologist-Dr. Shane Phone: [phone] Fax: [phone]  Cardiologist-Dr. Croitoru Phone:[phone]  Gynecologist-Dr. Rox  Phone: [phone]  Please let her know when records are sent

## 2023-11-23 ENCOUNTER — Telehealth: Payer: Self-pay | Admitting: Internal Medicine

## 2023-11-23 DIAGNOSIS — H2513 Age-related nuclear cataract, bilateral: Secondary | ICD-10-CM | POA: Diagnosis not present

## 2023-11-23 DIAGNOSIS — H11121 Conjunctival concretions, right eye: Secondary | ICD-10-CM | POA: Diagnosis not present

## 2023-11-23 DIAGNOSIS — H10823 Rosacea conjunctivitis, bilateral: Secondary | ICD-10-CM | POA: Diagnosis not present

## 2023-11-23 DIAGNOSIS — H04123 Dry eye syndrome of bilateral lacrimal glands: Secondary | ICD-10-CM | POA: Diagnosis not present

## 2023-11-23 NOTE — Telephone Encounter (Signed)
 Patient called stating she has been having lower abdominal pain and diarrhea. States she has cramping every time she has a bowel movement. Patient did not wish to wait until December to be seen. Patient is requesting a call to discuss sooner recommendations. Please advise, thank you

## 2023-11-23 NOTE — Telephone Encounter (Signed)
 Left message for pt to call back

## 2023-11-24 NOTE — Telephone Encounter (Signed)
 Pt reports she has been having issues with lower abd pain and diarrhea. Requesting to be seen. Pt scheduled to see Ellouise Console PA 12/01/23@1 :30pm. Pt aware of appt.

## 2023-11-26 ENCOUNTER — Encounter (HOSPITAL_BASED_OUTPATIENT_CLINIC_OR_DEPARTMENT_OTHER): Payer: Self-pay

## 2023-11-26 ENCOUNTER — Emergency Department (HOSPITAL_BASED_OUTPATIENT_CLINIC_OR_DEPARTMENT_OTHER): Admitting: Radiology

## 2023-11-26 ENCOUNTER — Emergency Department (HOSPITAL_BASED_OUTPATIENT_CLINIC_OR_DEPARTMENT_OTHER)

## 2023-11-26 ENCOUNTER — Emergency Department (HOSPITAL_BASED_OUTPATIENT_CLINIC_OR_DEPARTMENT_OTHER)
Admission: EM | Admit: 2023-11-26 | Discharge: 2023-11-27 | Disposition: A | Attending: Emergency Medicine | Admitting: Emergency Medicine

## 2023-11-26 DIAGNOSIS — S0093XA Contusion of unspecified part of head, initial encounter: Secondary | ICD-10-CM | POA: Insufficient documentation

## 2023-11-26 DIAGNOSIS — S42254A Nondisplaced fracture of greater tuberosity of right humerus, initial encounter for closed fracture: Secondary | ICD-10-CM | POA: Diagnosis not present

## 2023-11-26 DIAGNOSIS — M25511 Pain in right shoulder: Secondary | ICD-10-CM | POA: Diagnosis not present

## 2023-11-26 DIAGNOSIS — S42252A Displaced fracture of greater tuberosity of left humerus, initial encounter for closed fracture: Secondary | ICD-10-CM | POA: Diagnosis not present

## 2023-11-26 DIAGNOSIS — W19XXXA Unspecified fall, initial encounter: Secondary | ICD-10-CM | POA: Insufficient documentation

## 2023-11-26 DIAGNOSIS — S42251A Displaced fracture of greater tuberosity of right humerus, initial encounter for closed fracture: Secondary | ICD-10-CM | POA: Diagnosis not present

## 2023-11-26 DIAGNOSIS — S0990XA Unspecified injury of head, initial encounter: Secondary | ICD-10-CM | POA: Diagnosis not present

## 2023-11-26 NOTE — ED Triage Notes (Signed)
 Pt c/o mechanical fall just PTA, c/o R shoulder, R side head pain. Denies vision changes, a little nausea but it's better now.  Reports limited ROM unassisted in R shoulder

## 2023-11-26 NOTE — Discharge Instructions (Signed)
 Take tylenol  for pain.  You can take tramadol if needed.

## 2023-11-26 NOTE — ED Provider Notes (Incomplete)
 [place] EMERGENCY DEPARTMENT AT [facility] Provider Note   CSN: [id number] Arrival date & time: 11/26/23  1956     Patient presents with: No chief complaint on file.   [name] is a 73 y.o. female.  {Add pertinent medical, surgical, social history, OB history to YEP:67052} Patient complains of falling and striking the right side of her head and her right shoulder.  Patient has pain when she moves her shoulder.  Patient reports that she has had dizziness since the time of the fall.  Patient did not lose consciousness.  Patient states she slipped and struck a box.  The history is provided by the patient. No language interpreter was used.       Prior to Admission medications   Medication Sig Start Date End Date Taking? Authorizing Provider  alfuzosin (UROXATRAL) 10 MG 24 hr tablet Take 10 mg by mouth daily. Patient not taking: Reported on 11/15/2023 11/08/23   [provider]  atenolol  (TENORMIN ) 25 MG tablet Take 0.5 tablets (12.5 mg total) by mouth daily as needed. 10/01/23   Croitoru, Mihai, MD  Coenzyme Q10 (CO Q10) 100 MG CAPS Take by mouth daily.    [provider]  Doxycycline  Hyclate 50 MG TABS Take 25 mg by mouth daily at 6 (six) AM.    [provider]  Doxylamine Succinate, Sleep, (SLEEP-AID PO) Take by mouth. [facility] sleep aid    [provider]  escitalopram (LEXAPRO) 5 MG tablet Take 1 tablet (5 mg total) by mouth daily. 11/15/23   [name], MD  estradiol (ESTRACE) 0.5 MG tablet Take 0.5 mg by mouth daily.    [provider]  hydrocortisone  (ANUSOL -HC) 2.5 % rectal cream Place 1 Application rectally 2 (two) times daily. 04/13/23   [name], MD  meclizine  (ANTIVERT ) 12.5 MG tablet TAKE 1 TABLET BY MOUTH EVERY 6 -12 HOURS AS NEEDED FOR DIZZINESS 07/26/23   [name], MD  metFORMIN  (GLUCOPHAGE -XR) 500 MG 24 hr tablet TAKE 1 TABLET BY MOUTH AT BEDTIME 07/02/23   [name],  MD  Multiple Vitamin (MULTIVITAMIN) tablet Take 1 tablet by mouth daily.    [provider]  nystatin cream (MYCOSTATIN) Apply 1 Application topically daily at 6 (six) AM. 02/25/23   [provider]  omeprazole  (PRILOSEC) 20 MG capsule Take 20 mg by mouth daily.    [provider]  phenazopyridine (PYRIDIUM) 200 MG tablet Take 200 mg by mouth 3 (three) times daily. 11/04/23   [provider]  RESTASIS 0.05 % ophthalmic emulsion  04/02/21   [provider]  rosuvastatin  (CRESTOR ) 10 MG tablet TAKE 1 TABLET BY MOUTH ONCE DAILY **MUST SCHEDULE AND ATTEND ANNUAL APPOINTMENT FOR FUTURE REFILLS 1ST ATTEMPT 10/26/23   Croitoru, Mihai, MD  SAMBUCOL BLACK ELDERBERRY PO Take 1 capsule by mouth 2 (two) times a week.    [provider]  silodosin (RAPAFLO) 4 MG CAPS capsule Take 4 mg by mouth daily. 11/09/23   [provider]  vitamin B-12 (CYANOCOBALAMIN ) 50 MCG tablet Take 50 mcg by mouth daily.    [provider]  zolmitriptan  (ZOMIG -ZMT) 2.5 MG disintegrating tablet TAKE 1 TABLET BY MOUTH AS NEEDED FOR MIGRAINE Patient not taking: Reported on 11/15/2023 06/15/23   [name], MD    Allergies: Codeine, Compazine, Gabapentin, Ondansetron  hcl, Other, Prednisone, Prochlorperazine edisylate, Sulfa antibiotics, Sulfonamide derivatives, and Alfuzosin hcl er    Review of Systems  Updated Vital Signs BP (!) 142/70  Pulse 85   Temp 98 F (36.7 C)   Resp 20   SpO2 95%   Physical Exam  (all labs ordered are listed, but only abnormal results are displayed) Labs Reviewed - No data to display  EKG: None  Radiology: CT Head Wo Contrast Result Date: 11/26/2023 EXAM: CT HEAD WITHOUT CONTRAST 11/26/2023 11:19:32 PM TECHNIQUE: CT of the head was performed without the administration of intravenous contrast. Automated exposure control, iterative reconstruction, and/or weight based adjustment of the mA/kV was utilized to reduce the  radiation dose to as low as reasonably achievable. COMPARISON: 3 / 6 / 2012. CLINICAL HISTORY: Head trauma, minor (Age >= 65y). Fall, rt sided headpain. FINDINGS: BRAIN AND VENTRICLES: No acute hemorrhage. No evidence of acute infarct. No hydrocephalus. No extra-axial collection. No mass effect or midline shift. ORBITS: No acute abnormality. SINUSES: No acute abnormality. SOFT TISSUES AND SKULL: No acute soft tissue abnormality. No skull fracture. IMPRESSION: 1. No acute intracranial abnormality. Electronically signed by: Norman Gatlin MD 11/26/2023 11:40 PM EDT RP Workstation: [id number]   DG Shoulder Right Result Date: 11/26/2023 EXAM: 1 VIEW XRAY OF THE RIGHT SHOULDER 11/26/2023 08:45:00 PM COMPARISON: None available. CLINICAL HISTORY: fall. c/o mechanical fall just PTA, c/o R shoulder, R side head pain. FINDINGS: BONES AND JOINTS: Glenohumeral joint is normally aligned. Cortical step-off of the greater tuberosity with associated underlying lucency. The [facility] joint is unremarkable in appearance. SOFT TISSUES: No abnormal calcifications. Visualized lung is unremarkable. IMPRESSION: 1. Acute nondisplaced greater tuberosity fracture. Electronically signed by: Morgane Naveau MD 11/26/2023 09:21 PM EDT RP Workstation: [id number]    {Document cardiac monitor, telemetry assessment procedure when appropriate:32947} Procedures   Medications Ordered in the ED - No data to display    {Click here for ABCD2, HEART and other calculators REFRESH Note before signing:1}                              Medical Decision Making Amount and/or Complexity of Data Reviewed Radiology: ordered.   ***  {Document critical care time when appropriate  Document review of labs and clinical decision tools ie CHADS2VASC2, etc  Document your independent review of radiology images and any outside records  Document your discussion with family members, caretakers and with consultants  Document social determinants of health  affecting pt's care  Document your decision making why or why not admission, treatments were needed:32947:::1}   Final diagnoses:  Closed nondisplaced fracture of greater tuberosity of right humerus, initial encounter  Contusion of head, unspecified part of head, initial encounter    ED Discharge Orders     None

## 2023-11-26 NOTE — ED Provider Notes (Signed)
 [place] EMERGENCY DEPARTMENT AT [facility] Provider Note   CSN: [id number] Arrival date & time: 11/26/23  1956     Patient presents with: No chief complaint on file.   [name] is a 73 y.o. female.   Patient complains of falling and striking the right side of her head and her right shoulder.  Patient has pain when she moves her shoulder.  Patient reports that she has had dizziness since the time of the fall.  Patient did not lose consciousness.  Patient states she slipped and struck a box.  Patient reports difficulty moving her shoulder.  Patient has a bruised area to the right side of her head.  Patient denies any other area of injury.  Patient denies any neck pain she denies any back pain.  Patient denies any chest or abdominal pain.  Patient denies any injury to her knees.  Patient denies any nausea or vomiting  The history is provided by the patient. No language interpreter was used.       Prior to Admission medications   Medication Sig Start Date End Date Taking? Authorizing Provider  alfuzosin (UROXATRAL) 10 MG 24 hr tablet Take 10 mg by mouth daily. Patient not taking: Reported on 11/15/2023 11/08/23   [provider]  atenolol  (TENORMIN ) 25 MG tablet Take 0.5 tablets (12.5 mg total) by mouth daily as needed. 10/01/23   Croitoru, Mihai, MD  Coenzyme Q10 (CO Q10) 100 MG CAPS Take by mouth daily.    [provider]  Doxycycline  Hyclate 50 MG TABS Take 25 mg by mouth daily at 6 (six) AM.    [provider]  Doxylamine Succinate, Sleep, (SLEEP-AID PO) Take by mouth. [facility] sleep aid    [provider]  escitalopram (LEXAPRO) 5 MG tablet Take 1 tablet (5 mg total) by mouth daily. 11/15/23   [name], MD  estradiol (ESTRACE) 0.5 MG tablet Take 0.5 mg by mouth daily.    [provider]  hydrocortisone  (ANUSOL -HC) 2.5 % rectal cream Place 1 Application rectally 2 (two) times daily. 04/13/23   [name], MD   meclizine  (ANTIVERT ) 12.5 MG tablet TAKE 1 TABLET BY MOUTH EVERY 6 -12 HOURS AS NEEDED FOR DIZZINESS 07/26/23   [name], MD  metFORMIN  (GLUCOPHAGE -XR) 500 MG 24 hr tablet TAKE 1 TABLET BY MOUTH AT BEDTIME 07/02/23   [name], MD  Multiple Vitamin (MULTIVITAMIN) tablet Take 1 tablet by mouth daily.    [provider]  nystatin cream (MYCOSTATIN) Apply 1 Application topically daily at 6 (six) AM. 02/25/23   [provider]  omeprazole  (PRILOSEC) 20 MG capsule Take 20 mg by mouth daily.    [provider]  phenazopyridine (PYRIDIUM) 200 MG tablet Take 200 mg by mouth 3 (three) times daily. 11/04/23   [provider]  RESTASIS 0.05 % ophthalmic emulsion  04/02/21   [provider]  rosuvastatin  (CRESTOR ) 10 MG tablet TAKE 1 TABLET BY MOUTH ONCE DAILY **MUST SCHEDULE AND ATTEND ANNUAL APPOINTMENT FOR FUTURE REFILLS 1ST ATTEMPT 10/26/23   Croitoru, Mihai, MD  SAMBUCOL BLACK ELDERBERRY PO Take 1 capsule by mouth 2 (two) times a week.    [provider]  silodosin (RAPAFLO) 4 MG CAPS capsule Take 4 mg by mouth daily. 11/09/23   [provider]  vitamin B-12 (CYANOCOBALAMIN ) 50 MCG tablet Take 50 mcg by mouth daily.    [provider]  zolmitriptan  (ZOMIG -ZMT) 2.5 MG disintegrating tablet TAKE 1 TABLET BY  MOUTH AS NEEDED FOR MIGRAINE Patient not taking: Reported on 11/15/2023 06/15/23   [name], MD    Allergies: Codeine, Compazine, Gabapentin, Ondansetron  hcl, Other, Prednisone, Prochlorperazine edisylate, Sulfa antibiotics, Sulfonamide derivatives, and Alfuzosin hcl er    Review of Systems  Musculoskeletal:  Positive for arthralgias.  All other systems reviewed and are negative.   Updated Vital Signs BP (!) 142/70   Pulse 85   Temp 98 F (36.7 C)   Resp 20   SpO2 95%   Physical Exam Vitals and nursing note reviewed.  Constitutional:      Appearance: Normal appearance. She is  well-developed.  HENT:     Head: Normocephalic.     Comments: Bruised area right forehead, small abrasion,    Nose: Nose normal.  Cardiovascular:     Rate and Rhythm: Normal rate.  Pulmonary:     Effort: Pulmonary effort is normal.  Abdominal:     General: There is no distension.  Musculoskeletal:        General: Swelling and tenderness present.     Cervical back: Normal range of motion.     Comments: Tender right shoulder, good pulse, decreased range of motion  Skin:    General: Skin is warm.  Neurological:     General: No focal deficit present.     Mental Status: She is alert and oriented to person, place, and time.     (all labs ordered are listed, but only abnormal results are displayed) Labs Reviewed - No data to display  EKG: None  Radiology: CT Head Wo Contrast Result Date: 11/26/2023 EXAM: CT HEAD WITHOUT CONTRAST 11/26/2023 11:19:32 PM TECHNIQUE: CT of the head was performed without the administration of intravenous contrast. Automated exposure control, iterative reconstruction, and/or weight based adjustment of the mA/kV was utilized to reduce the radiation dose to as low as reasonably achievable. COMPARISON: 3 / 6 / 2012. CLINICAL HISTORY: Head trauma, minor (Age >= 65y). Fall, rt sided headpain. FINDINGS: BRAIN AND VENTRICLES: No acute hemorrhage. No evidence of acute infarct. No hydrocephalus. No extra-axial collection. No mass effect or midline shift. ORBITS: No acute abnormality. SINUSES: No acute abnormality. SOFT TISSUES AND SKULL: No acute soft tissue abnormality. No skull fracture. IMPRESSION: 1. No acute intracranial abnormality. Electronically signed by: Norman Gatlin MD 11/26/2023 11:40 PM EDT RP Workstation: [id number]   DG Shoulder Right Result Date: 11/26/2023 EXAM: 1 VIEW XRAY OF THE RIGHT SHOULDER 11/26/2023 08:45:00 PM COMPARISON: None available. CLINICAL HISTORY: fall. c/o mechanical fall just PTA, c/o R shoulder, R side head pain. FINDINGS: BONES AND  JOINTS: Glenohumeral joint is normally aligned. Cortical step-off of the greater tuberosity with associated underlying lucency. The [facility] joint is unremarkable in appearance. SOFT TISSUES: No abnormal calcifications. Visualized lung is unremarkable. IMPRESSION: 1. Acute nondisplaced greater tuberosity fracture. Electronically signed by: Morgane Naveau MD 11/26/2023 09:21 PM EDT RP Workstation: [id number]     Procedures   Medications Ordered in the ED - No data to display                                  Medical Decision Making Patient reports she slipped and fell hitting the right side of her head and her right shoulder.  Patient states she did not lose consciousness.  She has been dizzy since she hit her head.  Patient complains of pain in her shoulder  Amount and/or Complexity of Data Reviewed Independent Historian: spouse  Details: Patient is here with her husband who is supportive Radiology: ordered and independent interpretation performed. Decision-making details documented in ED Course.    Details: Right shoulder shows nondisplaced fracture of greater tuberosity of the humerus CT head shows no acute findings.  Risk Risk Details: Patient is counseled on results and shoulder fracture.  Patient is followed by Dr. Stefani at [facility].  Patient is advised to call [facility] on Monday to schedule to be seen for evaluation.  Patient has tramadol that she was recently prescribed.  Patient is advised she can take Tylenol  and if she needs she can take tramadol in addition to Tylenol .  Patient is placed in a shoulder immobilizer.  Patient is discharged in stable condition        Final diagnoses:  Closed nondisplaced fracture of greater tuberosity of right humerus, initial encounter  Contusion of head, unspecified part of head, initial encounter    ED Discharge Orders     None     An After Visit Summary was printed and given to the patient.      Flint Sonny POUR,  PA-C 11/27/23 0045    Zackowski, Scott, MD 11/28/23 1229

## 2023-11-27 DIAGNOSIS — M25511 Pain in right shoulder: Secondary | ICD-10-CM | POA: Diagnosis not present

## 2023-11-27 DIAGNOSIS — S42254A Nondisplaced fracture of greater tuberosity of right humerus, initial encounter for closed fracture: Secondary | ICD-10-CM | POA: Diagnosis not present

## 2023-11-29 DIAGNOSIS — S42201A Unspecified fracture of upper end of right humerus, initial encounter for closed fracture: Secondary | ICD-10-CM | POA: Diagnosis not present

## 2023-12-01 ENCOUNTER — Ambulatory Visit: Payer: Self-pay

## 2023-12-01 ENCOUNTER — Encounter: Payer: Self-pay | Admitting: Physician Assistant

## 2023-12-01 ENCOUNTER — Other Ambulatory Visit

## 2023-12-01 ENCOUNTER — Ambulatory Visit: Admitting: Physician Assistant

## 2023-12-01 VITALS — BP 110/62 | HR 68 | Ht 61.0 in | Wt 111.2 lb

## 2023-12-01 DIAGNOSIS — R197 Diarrhea, unspecified: Secondary | ICD-10-CM | POA: Diagnosis not present

## 2023-12-01 DIAGNOSIS — M7501 Adhesive capsulitis of right shoulder: Secondary | ICD-10-CM | POA: Diagnosis not present

## 2023-12-01 DIAGNOSIS — K580 Irritable bowel syndrome with diarrhea: Secondary | ICD-10-CM | POA: Diagnosis not present

## 2023-12-01 DIAGNOSIS — M720 Palmar fascial fibromatosis [Dupuytren]: Secondary | ICD-10-CM | POA: Diagnosis not present

## 2023-12-01 DIAGNOSIS — N200 Calculus of kidney: Secondary | ICD-10-CM | POA: Diagnosis not present

## 2023-12-01 DIAGNOSIS — R310 Gross hematuria: Secondary | ICD-10-CM | POA: Diagnosis not present

## 2023-12-01 NOTE — Patient Instructions (Addendum)
 Your provider has requested that you go to the basement level for lab work before leaving today. Press B on the elevator. The lab is located at the first door on the left as you exit the elevator.  We have given you samples of the following medication to take: IBGard 2 capsules twice daily     For Irritable Bowel Syndrome / Colon Spasm / Abdominal Cramps: IB Gard (Peppermint Oil) - Over the Counter Take 2 capsules Twice daily   Please follow up sooner if symptoms increase or worsen  Due to recent changes in healthcare laws, you may see the results of your imaging and laboratory studies on MyChart before your provider has had a chance to review them.  We understand that in some cases there may be results that are confusing or concerning to you. Not all laboratory results come back in the same time frame and the provider may be waiting for multiple results in order to interpret others.  Please give us  48 hours in order for your provider to thoroughly review all the results before contacting the office for clarification of your results.   Thank you for trusting me with your gastrointestinal care!   Ellouise Console, PA-C _______________________________________________________  If your blood pressure at your visit was 140/90 or greater, please contact your primary care physician to follow up on this.  _______________________________________________________  If you are age 37 or older, your body mass index should be between 23-30. Your Body mass index is 21.02 kg/m. If this is out of the aforementioned range listed, please consider follow up with your Primary Care Provider.  If you are age 64 or younger, your body mass index should be between 19-25. Your Body mass index is 21.02 kg/m. If this is out of the aformentioned range listed, please consider follow up with your Primary Care Provider.   ________________________________________________________  The [facility] providers would like to  encourage you to use MYCHART to communicate with providers for non-urgent requests or questions.  Due to long hold times on the telephone, sending your provider a message by [facility] may be a faster and more efficient way to get a response.  Please allow 48 business hours for a response.  Please remember that this is for non-urgent requests.  _______________________________________________________

## 2023-12-01 NOTE — Telephone Encounter (Signed)
 FYI Only or Action Required?: FYI only for provider.  Patient was last seen in primary care on 11/15/2023 by [name], MD.  Called Nurse Triage reporting Diarrhea.  Symptoms began yesterday.  Interventions attempted: OTC medications: imodium and meclizine .  Symptoms are: stable.  Triage Disposition: See PCP When Office is Open (Within 3 Days)  Patient/caregiver understands and will follow disposition?: Yes Patient reports fall on 10/17 resulting in RIGHT shoulder fracture. Pt says that since then she has been feeling shaky and had some loose stools that started last night as well as dizziness that started this morning. Pt took her Meclizine  for the dizziness and some immodium for the diarrhea.  Pt asking if she should stop taking her Metformin . Pt asking for visit to speak with provider regarding any medication adjustments.   Message from Franky GRADE sent at 12/01/2023  9:26 AM EDT  Reason for Triage: Patient fell last friday and broke her right should. Patient is on metFORMIN  (GLUCOPHAGE -XR) 500 MG 24 hr tablet [[id number]] and is experiencing shakes and diarrhea and doesn't know if its her nerves causing symptoms or the metFormin . Patient is currently at a follow up for her should can receive calls after 11 am.   Reason for Disposition  [1] MILD diarrhea (e.g., 1-3 or more stools than normal in past 24 hours) AND [2] present >  7 days  (Exception: Chronic diarrhea that is not worse.)    Pt reports fall resulting in head injury and right shoulder fracture on 10/17. Now having diarrhea and high anxiety  Answer Assessment - Initial Assessment Questions 1. DIARRHEA SEVERITY: How bad is the diarrhea? How many more stools have you had in the past 24 hours than normal?      3-4 today, took some   2. ONSET: When did the diarrhea begin?      Last night  3. STOOL DESCRIPTION:  How loose or watery is the diarrhea? What is the stool color? Is there any blood or mucous in the  stool?     Loose stools  4. VOMITING: Are you also vomiting? If Yes, ask: How many times in the past 24 hours?      No  5. ABDOMEN PAIN: Are you having any abdomen pain? If Yes, ask: What does it feel like? (e.g., crampy, dull, intermittent, constant)      No pain  6. ABDOMEN PAIN SEVERITY: If present, ask: How bad is the pain?  (e.g., Scale 1-10; mild, moderate, or severe)     No Pain  7. ORAL INTAKE: If vomiting, Have you been able to drink liquids? How much liquids have you had in the past 24 hours?     N/a  8. HYDRATION: Any signs of dehydration? (e.g., dry mouth [not just dry lips], too weak to stand, dizziness, new weight loss) When did you last urinate?     Pt reports dizziness as well, but states that when she fell she also hit her head  9. EXPOSURE: Have you traveled to a foreign country recently? Have you been exposed to anyone with diarrhea? Could you have eaten any food that was spoiled?     No  10. ANTIBIOTIC USE: Are you taking antibiotics now or have you taken antibiotics in the past 2 months?       No  11. OTHER SYMPTOMS: Do you have any other symptoms? (e.g., fever, blood in stool)       Dizziness and nausea  12. PREGNANCY: Is there any chance  you are pregnant? When was your last menstrual period?       No  Protocols used: Diarrhea-A-AH

## 2023-12-01 NOTE — Telephone Encounter (Signed)
 Noted. Pt is currently being seen in an appt with [name] at [facility]. She also has an appt scheduled with Roselie Mood, NP tomorrow at 11am.

## 2023-12-01 NOTE — Progress Notes (Signed)
 Tracey Console, PA-C [address] [place], [place]  [address] Phone: [phone]  Primary Care Physician: Tracey Elsie Sayre, MD Primary Gastroenterologist:  Tracey Console, PA-C / Norleen Kiang, MD   Chief Complaint: Follow-up abdominal pain and diarrhea       HPI:   73 year old female presents to evaluate lower abdominal cramping and diarrhea for 5 weeks.  Patient states she has had a flareup of intermittent lower abdominal pain and diarrhea which started around September 10, approximately 5 weeks ago.  She has had a lot of increased stress in the past 5 weeks which has been a trigger for her GI symptoms.  She reports severe claustrophobia and had a panic attack in an elevator prior to the onset of her GI symptoms.  She was having LLQ pain and hematuria.  She underwent abdominal pelvic CT 11/07/2023 and was found to have kidney stones.  No acute GI abnormality.  No diverticulitis.  She has followed up with urologist.  Cystoscopy unrevealing.  Currently her abdominal pain has improved.  She continues to have episodes of loose stools and intermittent lower abdominal cramping.  She has taken antibiotics for UTI recently including Keflex  and Cipro.  Has also taken doxycycline  for rosacea.  She denies rectal bleeding.  Currently taking metformin  500 mg once daily for prediabetes.  Also recently started probiotic align once daily with little benefit.  She takes OTC Imodium which helps her diarrhea.  Denies rectal bleeding.  11/07/2023 CT abdomen pelvis with contrast: 1. 2 mm calculus in the distal left ureter nearly at the ureterovesical junction. This is not causing any hydronephrosis. 2. Two tiny nonobstructing calculi in the lower pole collecting system of the right kidney. 3.  Normal liver, gallbladder, Pancreas, and intestines.  Prior hysterectomy.  No hernia.  11/15/2023 labs: Normal TSH, BMP.   11/07/2023: Normal hemoglobin 13.0, mild elevated white count 10.7.  Normal LFTs.  09/2019  colonoscopy: 2 small (3 mm to 4 mm) tubular adenoma polyps removed.  Excellent prep.  Mild sigmoid diverticulosis.  5-year repeat (due 09/2024).  2014 colonoscopy: 15 mm tubulovillous adenoma polyp removed from the rectum.  5-year repeat. 2004 colonoscopy: Normal by Dr. Obie, 10-year repeat.  Current Outpatient Medications  Medication Sig Dispense Refill   alfuzosin (UROXATRAL) 10 MG 24 hr tablet Take 10 mg by mouth daily.     atenolol  (TENORMIN ) 25 MG tablet Take 0.5 tablets (12.5 mg total) by mouth daily as needed. 30 tablet 3   clobetasol ointment (TEMOVATE) 0.05 % Apply 1 Application topically 2 (two) times daily.     Coenzyme Q10 (CO Q10) 100 MG CAPS Take by mouth daily.     Doxycycline  Hyclate 50 MG TABS Take 25 mg by mouth daily at 6 (six) AM.     Doxylamine Succinate, Sleep, (SLEEP-AID PO) Take by mouth. [facility] sleep aid     escitalopram (LEXAPRO) 5 MG tablet Take 1 tablet (5 mg total) by mouth daily. 45 tablet 2   estradiol (ESTRACE) 0.5 MG tablet Take 0.5 mg by mouth daily.     hydrocortisone  (ANUSOL -HC) 2.5 % rectal cream Place 1 Application rectally 2 (two) times daily. 30 g 0   meclizine  (ANTIVERT ) 12.5 MG tablet TAKE 1 TABLET BY MOUTH EVERY 6 -12 HOURS AS NEEDED FOR DIZZINESS 30 tablet 1   metFORMIN  (GLUCOPHAGE -XR) 500 MG 24 hr tablet TAKE 1 TABLET BY MOUTH AT BEDTIME 90 tablet 2   Multiple Vitamin (MULTIVITAMIN) tablet Take 1 tablet by mouth daily.  nystatin cream (MYCOSTATIN) Apply 1 Application topically daily at 6 (six) AM.     omeprazole  (PRILOSEC) 20 MG capsule Take 20 mg by mouth daily.     phenazopyridine (PYRIDIUM) 200 MG tablet Take 200 mg by mouth 3 (three) times daily.     RESTASIS 0.05 % ophthalmic emulsion      rosuvastatin  (CRESTOR ) 10 MG tablet TAKE 1 TABLET BY MOUTH ONCE DAILY **MUST SCHEDULE AND ATTEND ANNUAL APPOINTMENT FOR FUTURE REFILLS 1ST ATTEMPT 90 tablet 3   SAMBUCOL BLACK ELDERBERRY PO Take 1 capsule by mouth 2 (two) times a week.     silodosin  (RAPAFLO) 4 MG CAPS capsule Take 4 mg by mouth daily.     vitamin B-12 (CYANOCOBALAMIN ) 50 MCG tablet Take 50 mcg by mouth daily.     zolmitriptan  (ZOMIG -ZMT) 2.5 MG disintegrating tablet TAKE 1 TABLET BY MOUTH AS NEEDED FOR MIGRAINE 10 tablet 4   No current facility-administered medications for this visit.    Allergies as of 12/01/2023 - Review Complete 12/01/2023  Allergen Reaction Noted   Codeine     Compazine  12/01/2010   Gabapentin  12/15/2010   Ondansetron  hcl Other (See Comments) 03/12/2021   Other Other (See Comments) 12/21/2017   Prednisone  04/07/2007   Prochlorperazine edisylate  12/21/2017   Sulfa antibiotics Other (See Comments) 12/21/2017   Sulfonamide derivatives  09/20/2006   Alfuzosin hcl er Palpitations 11/15/2023    Past Medical History:  Diagnosis Date   Anal fissure    Anemia    during pregnancy   Anxiety    Basal cell carcinoma    DDD (degenerative disc disease)    Diverticulosis    Hypokalemia    LBP (low back pain)    Melanoma (HCC)    Migraine    MVP (mitral valve prolapse) 1990   psa   Rosacea    Ulcer    as a child    Past Surgical History:  Procedure Laterality Date   ABDOMINAL HYSTERECTOMY  1985   BREAST BIOPSY Left    secondary to infection   I & D EXTREMITY Right 09/09/2012   Procedure: Minor IRRIGATION AND DEBRIDEMENT EXTREMITY paronychia of right thumb;  Surgeon: Arley JONELLE Curia, MD;  Location: [facility];  Service: Orthopedics;  Laterality: Right;   I & D EXTREMITY Left 01/26/2014   Procedure: MINOR IINCISION AND DRAINAGE paronychia left index finger;  Surgeon: Arley Curia, MD;  Location: [facility];  Service: Orthopedics;  Laterality: Left;  left index   OOPHORECTOMY Left 1994   REPAIR KNEE LIGAMENT Left 1989   TONSILLECTOMY  1955   TUBAL LIGATION     VEIN SURGERY      Review of Systems:    All systems reviewed and negative except where noted in HPI.    Physical Exam:  BP 110/62 (BP Location:  Left Arm, Patient Position: Sitting, Cuff Size: Normal)   Pulse 68   Ht 5' 1 (1.549 m)   Wt 111 lb 4 oz (50.5 kg)   BMI 21.02 kg/m  No LMP recorded. Patient has had a hysterectomy.  General: Well-nourished, well-developed in no acute distress.  Lungs: Clear to auscultation bilaterally. Non-labored. Heart: Regular rate and rhythm, no murmurs rubs or gallops.  Abdomen: Bowel sounds are normal; Abdomen is Soft; No hepatosplenomegaly, masses or hernias; mild diffuse lower abdominal Tenderness; no upper abdominal tenderness.  No guarding or rebound tenderness. Neuro: Alert and oriented x 3.  Grossly intact.  Psych: Alert and  cooperative, anxious mood and affect.   Imaging Studies: CT Head Wo Contrast Result Date: 11/26/2023 EXAM: CT HEAD WITHOUT CONTRAST 11/26/2023 11:19:32 PM TECHNIQUE: CT of the head was performed without the administration of intravenous contrast. Automated exposure control, iterative reconstruction, and/or weight based adjustment of the mA/kV was utilized to reduce the radiation dose to as low as reasonably achievable. COMPARISON: 3 / 6 / 2012. CLINICAL HISTORY: Head trauma, minor (Age >= 65y). Fall, rt sided headpain. FINDINGS: BRAIN AND VENTRICLES: No acute hemorrhage. No evidence of acute infarct. No hydrocephalus. No extra-axial collection. No mass effect or midline shift. ORBITS: No acute abnormality. SINUSES: No acute abnormality. SOFT TISSUES AND SKULL: No acute soft tissue abnormality. No skull fracture. IMPRESSION: 1. No acute intracranial abnormality. Electronically signed by: Norman Gatlin MD 11/26/2023 11:40 PM EDT RP Workstation: [id number]   DG Shoulder Right Result Date: 11/26/2023 EXAM: 1 VIEW XRAY OF THE RIGHT SHOULDER 11/26/2023 08:45:00 PM COMPARISON: None available. CLINICAL HISTORY: fall. c/o mechanical fall just PTA, c/o R shoulder, R side head pain. FINDINGS: BONES AND JOINTS: Glenohumeral joint is normally aligned. Cortical step-off of the greater  tuberosity with associated underlying lucency. The [facility] joint is unremarkable in appearance. SOFT TISSUES: No abnormal calcifications. Visualized lung is unremarkable. IMPRESSION: 1. Acute nondisplaced greater tuberosity fracture. Electronically signed by: Morgane Naveau MD 11/26/2023 09:21 PM EDT RP Workstation: [id number]   CT ABDOMEN PELVIS W CONTRAST Result Date: 11/07/2023 CLINICAL DATA:  Abdominal pain and hematuria. EXAM: CT ABDOMEN AND PELVIS WITH CONTRAST TECHNIQUE: Multidetector CT imaging of the abdomen and pelvis was performed using the standard protocol following bolus administration of intravenous contrast. RADIATION DOSE REDUCTION: This exam was performed according to the departmental dose-optimization program which includes automated exposure control, adjustment of the mA and/or kV according to patient size and/or use of iterative reconstruction technique. CONTRAST:  75mL OMNIPAQUE  IOHEXOL  300 MG/ML  SOLN COMPARISON:  08/08/2019 FINDINGS: Lower chest: No acute abnormality. Hepatobiliary: No focal liver abnormality is seen. No gallstones, gallbladder wall thickening, or biliary dilatation. Pancreas: Unremarkable. No pancreatic ductal dilatation or surrounding inflammatory changes. Spleen: Normal in size without focal abnormality. Adrenals/Urinary Tract: No adrenal masses. There is a small 2 mm calculus within the distal left ureter nearly at the ureterovesical junction. This is not causing any hydronephrosis. Two tiny nonobstructing calculi are seen within the lower pole collecting system of the right kidney. The larger measures 2 mm. No hydronephrosis bilaterally. No renal masses or ureteral abnormalities. The bladder is nearly completely decompressed. Stomach/Bowel: Bowel shows no evidence of obstruction, ileus, inflammation or lesion. The appendix is not discretely visualized. No free intraperitoneal air. Vascular/Lymphatic: No significant vascular findings are present. No enlarged abdominal or  pelvic lymph nodes. Reproductive: Status post hysterectomy. No adnexal masses. Other: No abdominal wall hernia or abnormality. No abdominopelvic ascites. Musculoskeletal: No acute or significant osseous findings. IMPRESSION: 1. 2 mm calculus in the distal left ureter nearly at the ureterovesical junction. This is not causing any hydronephrosis. 2. Two tiny nonobstructing calculi in the lower pole collecting system of the right kidney. Electronically Signed   By: Marcey Moan M.D.   On: 11/07/2023 11:05   DG Chest 2 View Result Date: 11/07/2023 CLINICAL DATA:  Fatigue and weakness. EXAM: CHEST - 2 VIEW COMPARISON:  02/22/2018 FINDINGS: The heart size and mediastinal contours are within normal limits. There is no evidence of pulmonary edema, consolidation, pneumothorax, nodule or pleural fluid. The visualized skeletal structures are unremarkable. IMPRESSION: No active cardiopulmonary disease. Electronically Signed  By: Marcey Moan M.D.   On: 11/07/2023 10:50    Labs: CBC    Component Value Date/Time   WBC 10.7 (H) 11/07/2023 0852   RBC 4.22 11/07/2023 0852   HGB 13.0 11/07/2023 0852   HCT 40.0 11/07/2023 0852   PLT 348 11/07/2023 0852   MCV 94.8 11/07/2023 0852   MCH 30.8 11/07/2023 0852   MCHC 32.5 11/07/2023 0852   RDW 12.3 11/07/2023 0852   LYMPHSABS 2.3 08/27/2022 1537   MONOABS 0.7 08/27/2022 1537   EOSABS 0.1 08/27/2022 1537   BASOSABS 0.1 08/27/2022 1537    CMP     Component Value Date/Time   NA 142 11/15/2023 0906   K 3.8 11/15/2023 0906   CL 102 11/15/2023 0906   CO2 28 11/15/2023 0906   GLUCOSE 119 (H) 11/15/2023 0906   BUN 11 11/15/2023 0906   CREATININE 0.57 11/15/2023 0906   CALCIUM  10.2 11/15/2023 0906   PROT 7.9 11/07/2023 0852   ALBUMIN 4.4 11/07/2023 0852   AST 18 11/07/2023 0852   ALT 11 11/07/2023 0852   ALKPHOS 87 11/07/2023 0852   BILITOT 0.6 11/07/2023 0852   GFRNONAA >60 11/11/2023 1504   GFRAA >60 02/22/2018 1342     Assessment and Plan:    [name] is a 73 y.o. y/o female presents for flareup of lower abdominal cramping and loose stools.  I am most suspicious for IBS flare, triggered by anxiety and stress.  She is currently being followed by urologist for recent kidney stones and UTI.  She has taken antibiotic recently.  C. difficile is also in the differential, however I am less suspicious for this based on her symptoms.  1.  Irritable bowel syndrome, diarrhea predominant - I ordered C. difficile toxin PCR stool test per patient request. - Gave samples of IBgard, take 2 capsules twice daily.  If it works well, then she can purchase more over-the-counter. - Continue OTC Imodium as needed. - She will discuss holding metformin  with her PCP if diarrhea is persistent.  2.Recent nephrolithiasis, passed kidney stone Recent episode confirmed by CT scan with severe left lower abdominal pain. - Encourage increased fluid intake. - Advise adding lemon to water to prevent stones. - Continue follow-up with urologist.  3.  History of colon polyps - 5-year repeat colonoscopy will be due 09/2024.   Tracey Console, PA-C  Follow up as needed if worsening or persistent GI symptoms.

## 2023-12-02 ENCOUNTER — Encounter: Payer: Self-pay | Admitting: Family Medicine

## 2023-12-02 ENCOUNTER — Ambulatory Visit (INDEPENDENT_AMBULATORY_CARE_PROVIDER_SITE_OTHER): Admitting: Family Medicine

## 2023-12-02 VITALS — BP 122/62 | HR 69 | Temp 95.1°F | Ht 61.0 in | Wt 112.4 lb

## 2023-12-02 DIAGNOSIS — R110 Nausea: Secondary | ICD-10-CM | POA: Diagnosis not present

## 2023-12-02 DIAGNOSIS — G4709 Other insomnia: Secondary | ICD-10-CM | POA: Diagnosis not present

## 2023-12-02 DIAGNOSIS — T887XXA Unspecified adverse effect of drug or medicament, initial encounter: Secondary | ICD-10-CM | POA: Diagnosis not present

## 2023-12-02 DIAGNOSIS — Z9181 History of falling: Secondary | ICD-10-CM

## 2023-12-02 MED ORDER — ONDANSETRON HCL 4 MG PO TABS: 4.0000 mg | ORAL_TABLET | Freq: Three times a day (TID) | ORAL | 0 refills | Status: AC | PRN

## 2023-12-02 NOTE — Progress Notes (Signed)
 Established Patient Office Visit   Subjective:  Patient ID: [name], female    DOB: [birth year]  Age: 73 y.o. MRN: [id number]  Chief Complaint  Patient presents with   Diarrhea    Pt presents today for diarrhea. Also since she fell last Friday, she has been dizzy, nausea. Has shakes in the morning wants to know if she should stop metformin  for now States its been going on & off with the diarrhea . States the escitalopram (LEXAPRO) 5 MG tablet [[id number]] has made her nauseated, dizzy and a rapid heart beat     Diarrhea  Pertinent negatives include no abdominal pain, headaches or myalgias.   Encounter Diagnoses  Name Primary?   Medication side effect Yes   Other insomnia    History of fall    Nausea    After starting Lexapro patient developed nausea with vomiting, dizziness, diarrhea and palpitations.  She has since discontinued it and these are slowly improving.  Unfortunately she tripped on a box in her garage this past Friday fell and landed on her right side.  She sustained a fracture to her humerus.  The right side of her head hit a stool.  She has some ongoing nausea and lightheadedness.  She is having no headache.  She is also experiencing insomnia.  CT of head was negative.  No evidence for hemorrhage.   Review of Systems  Constitutional: Negative.   HENT: Negative.    Eyes:  Negative for blurred vision, discharge and redness.  Respiratory: Negative.    Cardiovascular: Negative.   Gastrointestinal:  Positive for nausea. Negative for abdominal pain and diarrhea.  Genitourinary: Negative.   Musculoskeletal: Negative.  Negative for myalgias.  Skin:  Negative for rash.  Neurological:  Negative for tingling, loss of consciousness, weakness and headaches.  Endo/Heme/Allergies:  Negative for polydipsia.     Current Outpatient Medications:    alfuzosin (UROXATRAL) 10 MG 24 hr tablet, Take 10 mg by mouth daily., Disp: , Rfl:    atenolol  (TENORMIN ) 25 MG tablet, Take 0.5  tablets (12.5 mg total) by mouth daily as needed., Disp: 30 tablet, Rfl: 3   clobetasol ointment (TEMOVATE) 0.05 %, Apply 1 Application topically 2 (two) times daily., Disp: , Rfl:    Coenzyme Q10 (CO Q10) 100 MG CAPS, Take by mouth daily., Disp: , Rfl:    Doxycycline  Hyclate 50 MG TABS, Take 25 mg by mouth daily at 6 (six) AM., Disp: , Rfl:    Doxylamine Succinate, Sleep, (SLEEP-AID PO), Take by mouth. [facility] sleep aid, Disp: , Rfl:    estradiol (ESTRACE) 0.5 MG tablet, Take 0.5 mg by mouth daily., Disp: , Rfl:    hydrocortisone  (ANUSOL -HC) 2.5 % rectal cream, Place 1 Application rectally 2 (two) times daily., Disp: 30 g, Rfl: 0   meclizine  (ANTIVERT ) 12.5 MG tablet, TAKE 1 TABLET BY MOUTH EVERY 6 -12 HOURS AS NEEDED FOR DIZZINESS, Disp: 30 tablet, Rfl: 1   metFORMIN  (GLUCOPHAGE -XR) 500 MG 24 hr tablet, TAKE 1 TABLET BY MOUTH AT BEDTIME, Disp: 90 tablet, Rfl: 2   Multiple Vitamin (MULTIVITAMIN) tablet, Take 1 tablet by mouth daily., Disp: , Rfl:    nystatin cream (MYCOSTATIN), Apply 1 Application topically daily at 6 (six) AM., Disp: , Rfl:    omeprazole  (PRILOSEC) 20 MG capsule, Take 20 mg by mouth daily., Disp: , Rfl:    ondansetron  (ZOFRAN ) 4 MG tablet, Take 1 tablet (4 mg total) by mouth every 8 (eight) hours as needed for nausea or  vomiting., Disp: 20 tablet, Rfl: 0   phenazopyridine (PYRIDIUM) 200 MG tablet, Take 200 mg by mouth 3 (three) times daily., Disp: , Rfl:    RESTASIS 0.05 % ophthalmic emulsion, , Disp: , Rfl:    rosuvastatin  (CRESTOR ) 10 MG tablet, TAKE 1 TABLET BY MOUTH ONCE DAILY **MUST SCHEDULE AND ATTEND ANNUAL APPOINTMENT FOR FUTURE REFILLS 1ST ATTEMPT, Disp: 90 tablet, Rfl: 3   SAMBUCOL BLACK ELDERBERRY PO, Take 1 capsule by mouth 2 (two) times a week., Disp: , Rfl:    silodosin (RAPAFLO) 4 MG CAPS capsule, Take 4 mg by mouth daily., Disp: , Rfl:    vitamin B-12 (CYANOCOBALAMIN ) 50 MCG tablet, Take 50 mcg by mouth daily., Disp: , Rfl:    zolmitriptan  (ZOMIG -ZMT) 2.5 MG  disintegrating tablet, TAKE 1 TABLET BY MOUTH AS NEEDED FOR MIGRAINE, Disp: 10 tablet, Rfl: 4   Objective:     BP 122/62   Pulse 69   Temp (!) 95.1 F (35.1 C)   Ht 5' 1 (1.549 m)   Wt 112 lb 6.4 oz (51 kg)   SpO2 96%   BMI 21.24 kg/m    Physical Exam Constitutional:      General: She is not in acute distress.    Appearance: Normal appearance. She is not ill-appearing, toxic-appearing or diaphoretic.  HENT:     Head: Normocephalic and atraumatic.      Right Ear: External ear normal.     Left Ear: External ear normal.     Mouth/Throat:     Mouth: Mucous membranes are moist.     Pharynx: Oropharynx is clear. No oropharyngeal exudate or posterior oropharyngeal erythema.  Eyes:     General: No scleral icterus.       Right eye: No discharge.        Left eye: No discharge.     Extraocular Movements: Extraocular movements intact.     Conjunctiva/sclera: Conjunctivae normal.     Pupils: Pupils are equal, round, and reactive to light.  Cardiovascular:     Rate and Rhythm: Normal rate and regular rhythm.  Pulmonary:     Effort: Pulmonary effort is normal. No respiratory distress.     Breath sounds: Normal breath sounds.  Abdominal:     General: Bowel sounds are normal.     Tenderness: There is no abdominal tenderness. There is no guarding.  Musculoskeletal:     Cervical back: No rigidity or tenderness.  Skin:    General: Skin is warm and dry.  Neurological:     Mental Status: She is alert and oriented to person, place, and time.  Psychiatric:        Mood and Affect: Mood normal.        Behavior: Behavior normal.      No results found for any visits on 12/02/23.    The 10-year ASCVD risk score (Arnett DK, et al., 2019) is: 18.8%    Assessment & Plan:   Medication side effect  Other insomnia  History of fall  Nausea -     Ondansetron  HCl; Take 1 tablet (4 mg total) by mouth every 8 (eight) hours as needed for nausea or vomiting.  Dispense: 20 tablet;  Refill: 0    Return Has follow-up on Thursday for physical..  Stop Lexapro.  May hold metformin .  Take magnesium citrate 200 mg nightly.  Hydrate well for lightheadedness.  Elsie Sim Lent, MD

## 2023-12-03 DIAGNOSIS — L820 Inflamed seborrheic keratosis: Secondary | ICD-10-CM | POA: Diagnosis not present

## 2023-12-03 DIAGNOSIS — Z85828 Personal history of other malignant neoplasm of skin: Secondary | ICD-10-CM | POA: Diagnosis not present

## 2023-12-03 NOTE — Progress Notes (Signed)
 Noted

## 2023-12-06 ENCOUNTER — Other Ambulatory Visit

## 2023-12-06 DIAGNOSIS — R197 Diarrhea, unspecified: Secondary | ICD-10-CM | POA: Diagnosis not present

## 2023-12-07 DIAGNOSIS — S42201A Unspecified fracture of upper end of right humerus, initial encounter for closed fracture: Secondary | ICD-10-CM | POA: Diagnosis not present

## 2023-12-08 ENCOUNTER — Ambulatory Visit: Payer: Self-pay | Admitting: Physician Assistant

## 2023-12-08 LAB — CLOSTRIDIUM DIFFICILE BY PCR: Toxigenic C. Difficile by PCR: NEGATIVE

## 2023-12-09 ENCOUNTER — Ambulatory Visit (INDEPENDENT_AMBULATORY_CARE_PROVIDER_SITE_OTHER): Payer: Medicare PPO | Admitting: Family Medicine

## 2023-12-09 ENCOUNTER — Encounter: Payer: Self-pay | Admitting: Family Medicine

## 2023-12-09 ENCOUNTER — Telehealth: Payer: Self-pay | Admitting: Family Medicine

## 2023-12-09 VITALS — BP 108/62 | HR 70 | Temp 97.2°F | Ht 61.0 in | Wt 112.0 lb

## 2023-12-09 DIAGNOSIS — Z0000 Encounter for general adult medical examination without abnormal findings: Secondary | ICD-10-CM | POA: Diagnosis not present

## 2023-12-09 DIAGNOSIS — F418 Other specified anxiety disorders: Secondary | ICD-10-CM | POA: Diagnosis not present

## 2023-12-09 DIAGNOSIS — F5104 Psychophysiologic insomnia: Secondary | ICD-10-CM

## 2023-12-09 LAB — MAGNESIUM: Magnesium: 2.2 mg/dL (ref 1.5–2.5)

## 2023-12-09 MED ORDER — MIRTAZAPINE 7.5 MG PO TABS: 7.5000 mg | ORAL_TABLET | Freq: Every day | ORAL | 1 refills | Status: DC

## 2023-12-09 NOTE — Progress Notes (Signed)
 Established Patient Office Visit   Subjective:  Patient ID: [name], female    DOB: 05-28-1950  Age: 73 y.o. MRN: [id number]  Chief Complaint  Patient presents with   Annual Exam    Pt is here today to be seen for her Annual PEX. Pt reports that she broke her right humerous on the 17th of October. She has had insomnia and anxiety ever since.     HPI Encounter Diagnoses  Name Primary?   Healthcare maintenance Yes   Anxiety with depression    Psychophysiological insomnia    For yearly physical and follow-up of anxiety with depression.  She is exercising regularly by walking.  She does have regular dental care.  She is up-to-date on her health maintenance.  She continues all medicines as directed.  Continues to recover from her right humeral fracture.  Anxiety with depression continues.  She is not sleeping well.  Denies significant pain from the fracture at this point.   Review of Systems  Constitutional: Negative.   HENT: Negative.    Eyes:  Negative for blurred vision, discharge and redness.  Respiratory: Negative.    Cardiovascular: Negative.   Gastrointestinal:  Negative for abdominal pain.  Genitourinary: Negative.   Musculoskeletal: Negative.  Negative for myalgias.  Skin:  Negative for rash.  Neurological:  Negative for tingling, loss of consciousness and weakness.  Endo/Heme/Allergies:  Negative for polydipsia.      12/09/2023    8:50 AM 11/15/2023    8:21 AM 12/07/2022    1:49 PM  Depression screen PHQ 2/9  Decreased Interest 1 0 0  Down, Depressed, Hopeless 1 0 0  PHQ - 2 Score 2 0 0  Altered sleeping 3  0  Tired, decreased energy 1  0  Change in appetite 2  0  Feeling bad or failure about yourself  0  0  Trouble concentrating 1  0  Moving slowly or fidgety/restless 0  0  Suicidal thoughts 0  0  PHQ-9 Score 9  0  Difficult doing work/chores Somewhat difficult  Not difficult at all      Current Outpatient Medications:    alfuzosin (UROXATRAL) 10  MG 24 hr tablet, Take 10 mg by mouth daily., Disp: , Rfl:    atenolol  (TENORMIN ) 25 MG tablet, Take 0.5 tablets (12.5 mg total) by mouth daily as needed., Disp: 30 tablet, Rfl: 3   clobetasol ointment (TEMOVATE) 0.05 %, Apply 1 Application topically 2 (two) times daily., Disp: , Rfl:    Coenzyme Q10 (CO Q10) 100 MG CAPS, Take by mouth daily., Disp: , Rfl:    Doxycycline  Hyclate 50 MG TABS, Take 25 mg by mouth daily at 6 (six) AM., Disp: , Rfl:    Doxylamine Succinate, Sleep, (SLEEP-AID PO), Take by mouth. [employer] sleep aid, Disp: , Rfl:    estradiol (ESTRACE) 0.5 MG tablet, Take 0.5 mg by mouth daily., Disp: , Rfl:    hydrocortisone  (ANUSOL -HC) 2.5 % rectal cream, Place 1 Application rectally 2 (two) times daily., Disp: 30 g, Rfl: 0   meclizine  (ANTIVERT ) 12.5 MG tablet, TAKE 1 TABLET BY MOUTH EVERY 6 -12 HOURS AS NEEDED FOR DIZZINESS, Disp: 30 tablet, Rfl: 1   metFORMIN  (GLUCOPHAGE -XR) 500 MG 24 hr tablet, TAKE 1 TABLET BY MOUTH AT BEDTIME, Disp: 90 tablet, Rfl: 2   mirtazapine (REMERON) 7.5 MG tablet, Take 1 tablet (7.5 mg total) by mouth at bedtime., Disp: 30 tablet, Rfl: 1   Multiple Vitamin (MULTIVITAMIN) tablet, Take 1 tablet  by mouth daily., Disp: , Rfl:    nystatin cream (MYCOSTATIN), Apply 1 Application topically daily at 6 (six) AM., Disp: , Rfl:    omeprazole  (PRILOSEC) 20 MG capsule, Take 20 mg by mouth daily., Disp: , Rfl:    ondansetron  (ZOFRAN ) 4 MG tablet, Take 1 tablet (4 mg total) by mouth every 8 (eight) hours as needed for nausea or vomiting., Disp: 20 tablet, Rfl: 0   phenazopyridine (PYRIDIUM) 200 MG tablet, Take 200 mg by mouth 3 (three) times daily., Disp: , Rfl:    RESTASIS 0.05 % ophthalmic emulsion, , Disp: , Rfl:    rosuvastatin  (CRESTOR ) 10 MG tablet, TAKE 1 TABLET BY MOUTH ONCE DAILY **MUST SCHEDULE AND ATTEND ANNUAL APPOINTMENT FOR FUTURE REFILLS 1ST ATTEMPT, Disp: 90 tablet, Rfl: 3   SAMBUCOL BLACK ELDERBERRY PO, Take 1 capsule by mouth 2 (two) times a week., Disp: ,  Rfl:    silodosin (RAPAFLO) 4 MG CAPS capsule, Take 4 mg by mouth daily., Disp: , Rfl:    vitamin B-12 (CYANOCOBALAMIN ) 50 MCG tablet, Take 50 mcg by mouth daily., Disp: , Rfl:    zolmitriptan  (ZOMIG -ZMT) 2.5 MG disintegrating tablet, TAKE 1 TABLET BY MOUTH AS NEEDED FOR MIGRAINE, Disp: 10 tablet, Rfl: 4   Objective:     BP 108/62 (BP Location: Left Arm, Cuff Size: Normal)   Pulse 70   Temp (!) 97.2 F (36.2 C) (Temporal)   Ht 5' 1 (1.549 m)   Wt 112 lb (50.8 kg)   SpO2 98%   BMI 21.16 kg/m  Wt Readings from Last 3 Encounters:  12/09/23 112 lb (50.8 kg)  12/02/23 112 lb 6.4 oz (51 kg)  12/01/23 111 lb 4 oz (50.5 kg)      Physical Exam Constitutional:      General: She is not in acute distress.    Appearance: Normal appearance. She is not ill-appearing, toxic-appearing or diaphoretic.  HENT:     Head: Normocephalic and atraumatic.     Right Ear: Tympanic membrane, ear canal and external ear normal.     Left Ear: Tympanic membrane, ear canal and external ear normal.     Mouth/Throat:     Mouth: Mucous membranes are moist.     Pharynx: Oropharynx is clear. No oropharyngeal exudate or posterior oropharyngeal erythema.  Eyes:     General: No scleral icterus.       Right eye: No discharge.        Left eye: No discharge.     Extraocular Movements: Extraocular movements intact.     Conjunctiva/sclera: Conjunctivae normal.     Pupils: Pupils are equal, round, and reactive to light.  Cardiovascular:     Rate and Rhythm: Normal rate and regular rhythm.  Pulmonary:     Effort: Pulmonary effort is normal. No respiratory distress.     Breath sounds: Normal breath sounds. No wheezing or rales.  Abdominal:     General: Bowel sounds are normal.  Musculoskeletal:     Cervical back: No rigidity or tenderness.  Skin:    General: Skin is warm and dry.  Neurological:     Mental Status: She is alert and oriented to person, place, and time.  Psychiatric:        Mood and Affect: Mood  normal.        Behavior: Behavior normal.      No results found for any visits on 12/09/23.    The 10-year ASCVD risk score (Arnett DK, et al., 2019) is: 15%  Assessment & Plan:   Healthcare maintenance  Anxiety with depression -     Mirtazapine; Take 1 tablet (7.5 mg total) by mouth at bedtime.  Dispense: 30 tablet; Refill: 1  Psychophysiological insomnia -     Mirtazapine; Take 1 tablet (7.5 mg total) by mouth at bedtime.  Dispense: 30 tablet; Refill: 1 -     Magnesium    Return in about 1 month (around 01/10/2024).  Start low-dose Remeron at at bedtime.  Information given on mindfulness based stress reduction.  Advised that there may be a hangover for the first 3 or 4 days of therapy.  Information given on health maintenance and disease prevention.  Follow-up in 4 weeks  Elsie Sim Lent, MD

## 2023-12-09 NOTE — Telephone Encounter (Signed)
 Pt is wondering if it would be a good idea for her to get a PT order for yoga to help with her shoulder & breathing exercises. Please advise pt at [phone] . She forgot to ask during her cpe today 12/09/23.

## 2023-12-10 ENCOUNTER — Ambulatory Visit: Payer: Self-pay

## 2023-12-10 ENCOUNTER — Ambulatory Visit: Payer: Self-pay | Admitting: Family Medicine

## 2023-12-10 NOTE — Telephone Encounter (Signed)
 CRM # [id number] Owner: None Status: Unresolved Primary Information  Source  Burdette, Gergely (Patient)   Subject  Lucio Rudell LITTIE Dagoberto (Patient)   Topic  Clinical - Red Word Triage    Communication  Red Word that prompted transfer to Nurse Triage:  Patient stated she is getting the shakes from mirtazapine (REMERON) 7.5 MG tablet and is getting the shakes and its making her very anxious spoke with NT over two hours ago stated some one was going to call her back and she has not heard back.   Answer Assessment - Initial Assessment Questions 1. REASON FOR CALL: What is the main reason for your call? or How can I best help you?     Patient following up on previous triage encounter. She was hoping to hear back by now. Aware that provider is with patients. Reached out to CAL to notify of message that pt would like addressed prior to EOD if possible. She did not sleep last night and is very anxious today. CMA alerted to let provider know of message.   Seeking low dose alternative to help pt sleep as Remeron did not help and is causing her to be shaking.  Protocols used: Information Only Call - No Triage-A-AH

## 2023-12-10 NOTE — Telephone Encounter (Signed)
 FYI Only or Action Required?: Action required by provider: clinical question for provider.  Patient was last seen in primary care on 12/09/2023 by Tracey Elsie Sayre, MD.  Called Nurse Triage reporting Medication Reaction.  Symptoms began yesterday.  Interventions attempted: Nothing.  Symptoms are: unchanged. Pt. Took first dose of Remeron last night and did not sleep and is shaky this morning. I can't take that medicine. I need something that will relax me and help me sleep. Please advise pt.  Triage Disposition: Call PCP Now  Patient/caregiver understands and will follow disposition?: Yes      Copied from CRM [id number]. Topic: Clinical - Red Word Triage >> Dec 10, 2023  8:51 AM [name] wrote: [name] that prompted transfer to Nurse Triage: Medication side effects. Patient stated she is getting the shakes from mirtazapine (REMERON) 7.5 MG tablet and it is not helping her sleep. Patient feeling drowsy. Reason for Disposition  [1] Caller has URGENT medicine question about med that primary care doctor (or NP/PA) or specialist prescribed AND [2] triager unable to answer question  Answer Assessment - Initial Assessment Questions 1. NAME of MEDICINE: What medicine(s) are you calling about?     Remeron 2. QUESTION: What is your question? (Rivera.g., double dose of medicine, side effect)     Side effects 3. PRESCRIBER: Who prescribed the medicine? Reason: if prescribed by specialist, call should be referred to that group.     pcp 4. SYMPTOMS: Do you have any symptoms? If Yes, ask: What symptoms are you having?  How bad are the symptoms (Rivera.g., mild, moderate, severe)     Insomnia, shaky 5. PREGNANCY:  Is there any chance that you are pregnant? When was your last menstrual period?     no  Protocols used: Medication Question Call-A-AH

## 2023-12-13 ENCOUNTER — Ambulatory Visit: Admitting: Nurse Practitioner

## 2023-12-13 ENCOUNTER — Ambulatory Visit

## 2023-12-13 ENCOUNTER — Encounter: Payer: Self-pay | Admitting: Nurse Practitioner

## 2023-12-13 ENCOUNTER — Ambulatory Visit (INDEPENDENT_AMBULATORY_CARE_PROVIDER_SITE_OTHER): Admitting: Nurse Practitioner

## 2023-12-13 VITALS — BP 108/62 | HR 67 | Temp 97.4°F | Ht 61.0 in | Wt 110.6 lb

## 2023-12-13 DIAGNOSIS — F419 Anxiety disorder, unspecified: Secondary | ICD-10-CM

## 2023-12-13 MED ORDER — BUSPIRONE HCL 5 MG PO TABS: 5.0000 mg | ORAL_TABLET | Freq: Two times a day (BID) | ORAL | 0 refills | Status: DC

## 2023-12-13 NOTE — Patient Instructions (Signed)
 It was great to see you!  Start buspar 1/2 - 1 tablet twice a day as needed for anxiety   You can take this with or without food   Let's follow-up in 4 weeks, sooner if you have concerns.  If a referral was placed today, you will be contacted for an appointment. Please note that routine referrals can sometimes take up to 3-4 weeks to process. Please call our office if you haven't heard anything after this time frame.  Take care,  Tinnie Harada, NP

## 2023-12-13 NOTE — Progress Notes (Signed)
 Established Patient Office Visit  Subjective   Patient ID: [name], female    DOB: 06-20-1950  Age: 73 y.o. MRN: [id number]  Chief Complaint  Patient presents with   Anxiety    Wants to discuss medication for this    HPI  Discussed the use of AI scribe software for clinical note transcription with the patient, who gave verbal consent to proceed.  History of Present Illness   [name] is a 73 year old female who presents with anxiety following recent stressful events. She is accompanied by her husband.  [name] has experienced significant anxiety since September, exacerbated by her husband's heart attack scare, and her own health issues, including passing kidney stones and fracturing her humerus. Her anxiety is persistent throughout the day and often accompanied by panic attacks. She describes feeling constantly on edge.  She has tried Remeron and Lexapro for anxiety but experienced adverse effects such as nausea, stomach upset, and a rapid heartbeat. She seeks a medication that can manage her anxiety without these side effects.  [name] currently takes Ambien for sleep, using half a pill at night. She regularly takes rosuvastatin  and estradiol, and has temporarily stopped metformin  due to concerns about hypoglycemia related to her anxiety.  She engages in lifestyle modifications, including walking, drinking water, and light exercises at home.        12/13/2023   11:30 AM 12/09/2023    8:50 AM 11/15/2023    8:21 AM 12/07/2022    1:49 PM 08/27/2022    2:48 PM  Depression screen PHQ 2/9  Decreased Interest 1 1 0 0 0  Down, Depressed, Hopeless 0 1 0 0 0  PHQ - 2 Score 1 2 0 0 0  Altered sleeping 0 3  0   Tired, decreased energy 0 1  0   Change in appetite 0 2  0   Feeling bad or failure about yourself  1 0  0   Trouble concentrating 1 1  0   Moving slowly or fidgety/restless 0 0  0   Suicidal thoughts 0 0  0   PHQ-9 Score 3 9  0   Difficult doing work/chores  Somewhat difficult Somewhat difficult  Not difficult at all       12/13/2023   11:31 AM 12/09/2023    8:50 AM 05/20/2022    9:20 AM 05/06/2021    9:24 AM  GAD 7 : Generalized Anxiety Score  Nervous, Anxious, on Edge 3 3 1 1   Control/stop worrying 3 3 1 1   Worry too much - different things 3 3 1 1   Trouble relaxing 3 2 1 1   Restless 3 2 0 1  Easily annoyed or irritable 0 1 0 0  Afraid - awful might happen 1 3 1 1   Total GAD 7 Score 16 17 5 6   Anxiety Difficulty Somewhat difficult Somewhat difficult Not difficult at all Somewhat difficult      ROS See pertinent positives and negatives per HPI.    Objective:     BP 108/62 (BP Location: Left Arm, Patient Position: Sitting, Cuff Size: Small)   Pulse 67   Temp (!) 97.4 F (36.3 C)   Ht 5' 1 (1.549 m)   Wt 110 lb 9.6 oz (50.2 kg)   SpO2 99%   BMI 20.90 kg/m    Physical Exam Vitals and nursing note reviewed.  Constitutional:      General: She is not in acute distress.  Appearance: Normal appearance.  HENT:     Head: Normocephalic.  Eyes:     Conjunctiva/sclera: Conjunctivae normal.  Cardiovascular:     Rate and Rhythm: Normal rate and regular rhythm.     Pulses: Normal pulses.     Heart sounds: Normal heart sounds.  Pulmonary:     Effort: Pulmonary effort is normal.     Breath sounds: Normal breath sounds.  Musculoskeletal:     Cervical back: Normal range of motion.     Comments: Right arm in sling   Skin:    General: Skin is warm.  Neurological:     General: No focal deficit present.     Mental Status: She is alert and oriented to person, place, and time.  Psychiatric:        Mood and Affect: Mood normal.        Behavior: Behavior normal.        Thought Content: Thought content normal.        Judgment: Judgment normal.    The 10-year ASCVD risk score (Arnett DK, et al., 2019) is: 15%    Assessment & Plan:   Problem List Items Addressed This Visit       Other   Anxiety - Primary   Chronic, not  controlled. Anxiety has worsened due to recent stressors, and previous medications including lexapro, amitriptyline , and remeron were not tolerated. She experiences anxiety and occasional panic attacks without depression. Prescribe Buspar 5 mg daily, with the option to split the dose if needed. She can take it with or without food. Educate on potential side effects. Encourage deep breathing and mindfulness practices. Follow-up with PCP in 4 weeks.      Relevant Medications   busPIRone (BUSPAR) 5 MG tablet    Return in about 4 weeks (around 01/10/2024) for Anxiety with PCP.    Tinnie DELENA Harada, NP

## 2023-12-13 NOTE — Assessment & Plan Note (Signed)
 Chronic, not controlled. Anxiety has worsened due to recent stressors, and previous medications including lexapro, amitriptyline , and remeron were not tolerated. She experiences anxiety and occasional panic attacks without depression. Prescribe Buspar 5 mg daily, with the option to split the dose if needed. She can take it with or without food. Educate on potential side effects. Encourage deep breathing and mindfulness practices. Follow-up with PCP in 4 weeks.

## 2023-12-14 DIAGNOSIS — S42201A Unspecified fracture of upper end of right humerus, initial encounter for closed fracture: Secondary | ICD-10-CM | POA: Diagnosis not present

## 2023-12-15 ENCOUNTER — Encounter: Payer: Self-pay | Admitting: Nurse Practitioner

## 2023-12-15 ENCOUNTER — Telehealth: Payer: Self-pay | Admitting: Family Medicine

## 2023-12-15 DIAGNOSIS — H6121 Impacted cerumen, right ear: Secondary | ICD-10-CM | POA: Diagnosis not present

## 2023-12-15 NOTE — Telephone Encounter (Signed)
 Copied from CRM #[id number]. Topic: Clinical - Medication Question >> Dec 15, 2023 11:10 AM Alfonso HERO wrote: Reason for CRM: patient was given New busPIRone HCl and says it isn't helping wondering if she can increase the dosage she's taking. Asking for a call back.

## 2023-12-15 NOTE — Telephone Encounter (Signed)
 I called and spoke with patient and advised her on medication.

## 2023-12-20 ENCOUNTER — Encounter: Payer: Self-pay | Admitting: Nurse Practitioner

## 2023-12-20 MED ORDER — BUSPIRONE HCL 7.5 MG PO TABS: 7.5000 mg | ORAL_TABLET | Freq: Two times a day (BID) | ORAL | 0 refills | Status: DC

## 2023-12-20 NOTE — Telephone Encounter (Signed)
 Copied from CRM [id number]. Topic: Clinical - Medical Advice >> Dec 20, 2023  9:31 AM Laymon HERO wrote: Reason for CRM: patient wanting to know if she is able to increase anxiety medications busPIRone (BUSPAR) 5 MG tablet.She said she thinks taking more would help better

## 2023-12-20 NOTE — Addendum Note (Signed)
 Addended by: BERNETA FALLOW A on: 12/20/2023 10:12 AM   Modules accepted: Orders

## 2023-12-21 ENCOUNTER — Ambulatory Visit: Admitting: Internal Medicine

## 2023-12-21 ENCOUNTER — Encounter: Payer: Self-pay | Admitting: Internal Medicine

## 2023-12-21 VITALS — BP 110/63 | HR 63 | Temp 98.7°F | Ht 61.0 in | Wt 110.0 lb

## 2023-12-21 DIAGNOSIS — F419 Anxiety disorder, unspecified: Secondary | ICD-10-CM

## 2023-12-21 NOTE — Patient Instructions (Addendum)
 Ashwagandha natural supplement- take as needed for anxiety . You can take this in addition to Buspar.   Continue taking your Ambien as needed for sleep at bedtime.

## 2023-12-21 NOTE — Progress Notes (Unsigned)
 [facility] [address] [place] [address] Dept: [phone] Dept Fax: [phone]  Acute Care Office Visit  Subjective:   [name] [birth year] 12/21/2023  Chief Complaint  Patient presents with   Medication Problem    Buspar     HPI:  Discussed the use of AI scribe software for clinical note transcription with the patient, who gave verbal consent to proceed.  History of Present Illness   [name] is a 73 year old female who presents with recently worsening anxiety, which has worsened since her husband's heart attack earlier this year and a recent fall that resulted in a fractured humerus. She has tried various medications for anxiety, including Remeron and Lexapro, but experienced side effects such as nausea, gastrointestinal upset, and rapid heartbeat.  Currently, she is taking Buspar at a dose of 5 mg once daily or as needed. However, the medication has not significantly alleviated her anxiety symptoms. She also notes side effects including low blood sugar, shakes, hot flashes, and diarrhea when taking the 7.5mg  dose, so she went back down to the 5mg  dose. She manages her low blood sugar by consuming orange juice and candy. Not currently taking her Metformin .   She is taking atenolol  for rapid heartbeat and Ambien for sleep disturbances. She takes Ambien at night and sometimes needs an additional half dose if she wakes up during the night, which was prescribed by her OBGYN recently. She is also using Imodium to manage diarrhea.  She has been trying breathing techniques to manage her anxiety but continues to struggle with symptoms.     12/13/2023   11:31 AM 12/09/2023    8:50 AM 05/20/2022    9:20 AM 05/06/2021    9:24 AM  GAD 7 : Generalized Anxiety Score  Nervous, Anxious, on Edge 3 3 1 1   Control/stop worrying 3 3 1 1   Worry too much - different things 3 3 1 1   Trouble relaxing 3 2 1 1    Restless 3 2 0 1  Easily annoyed or irritable 0 1 0 0  Afraid - awful might happen 1 3 1 1   Total GAD 7 Score 16 17 5 6   Anxiety Difficulty Somewhat difficult Somewhat difficult Not difficult at all Somewhat difficult       12/13/2023   11:30 AM 12/09/2023    8:50 AM 11/15/2023    8:21 AM  Depression screen PHQ 2/9  Decreased Interest 1 1 0  Down, Depressed, Hopeless 0 1 0  PHQ - 2 Score 1 2 0  Altered sleeping 0 3   Tired, decreased energy 0 1   Change in appetite 0 2   Feeling bad or failure about yourself  1 0   Trouble concentrating 1 1   Moving slowly or fidgety/restless 0 0   Suicidal thoughts 0 0   PHQ-9 Score 3  9    Difficult doing work/chores Somewhat difficult Somewhat difficult      Data saved with a previous flowsheet row definition         The following portions of the patient's history were reviewed and updated as appropriate: past medical history, past surgical history, family history, social history, allergies, medications, and problem list.   Patient Active Problem List   Diagnosis Date Noted   History of fall 12/02/2023   Post-viral cough syndrome 03/18/2023   Upper respiratory tract infection 02/26/2023   Nausea 02/26/2023   Pain of left hip 11/13/2022  Cervical strain 11/13/2022   Sciatica of right side 09/30/2022   B12 deficiency 09/30/2022   Anxiety 09/30/2022   Agatston coronary artery calcium  score between 200 and 399 08/03/2022   Osteopenia of necks of both femurs 06/23/2022   Dupuytren's contracture of both hands 06/23/2022   Low TSH level 05/20/2022   Prediabetes 05/20/2022   Cellulitis of finger of right hand 09/08/2021   Infection of skin 05/13/2021   Cervicalgia 05/06/2021   Grieving 05/06/2021   Conductive hearing loss of right ear 03/04/2021   Impacted cerumen of right ear 03/04/2021   Medication side effect 03/03/2021   Excessive cerumen in right ear canal 03/03/2021   Labyrinthitis 07/30/2020   Palpitations 04/15/2020    Plantar fasciitis 04/15/2020   Dysthymia 04/15/2020   Vitamin D  deficiency 04/15/2020   Gastroesophageal reflux disease 04/15/2020   Insect bite of left foot 11/24/2019   Pustule 11/24/2019   Pain of right thumb 03/09/2019   External hemorrhoids 03/03/2018   Other chest pain 02/23/2018   Insomnia 02/24/2017   Healthcare maintenance 02/24/2017   Conjunctivitis 03/01/2014   Seborrheic keratosis, inflamed 01/05/2012   Menopausal syndrome 12/22/2011   Encounter for counseling 11/11/2011   Skin cancer 07/14/2011   Basal cell carcinoma of face 03/10/2011   Migraine 05/14/2007   IRRITABLE BOWEL SYNDROME 05/14/2007   History of mitral valve prolapse 05/14/2007   Allergic rhinitis 04/07/2007   DERMATITIS DUE TO SOLAR RADIATION 04/07/2007   Premature beat 09/20/2006   DIVERTICULOSIS, COLON 04/13/2002   Past Medical History:  Diagnosis Date   Anal fissure    Anemia    during pregnancy   Anxiety    Basal cell carcinoma    DDD (degenerative disc disease)    Diverticulosis    Hypokalemia    LBP (low back pain)    Melanoma (HCC)    Migraine    MVP (mitral valve prolapse) 1990   psa   Rosacea    Ulcer    as a child   Past Surgical History:  Procedure Laterality Date   ABDOMINAL HYSTERECTOMY  1985   BREAST BIOPSY Left    secondary to infection   I & D EXTREMITY Right 09/09/2012   Procedure: Minor IRRIGATION AND DEBRIDEMENT EXTREMITY paronychia of right thumb;  Surgeon: Arley JONELLE Curia, MD;  Location: [facility];  Service: Orthopedics;  Laterality: Right;   I & D EXTREMITY Left 01/26/2014   Procedure: MINOR IINCISION AND DRAINAGE paronychia left index finger;  Surgeon: Arley Curia, MD;  Location: [facility];  Service: Orthopedics;  Laterality: Left;  left index   OOPHORECTOMY Left 1994   REPAIR KNEE LIGAMENT Left 1989   TONSILLECTOMY  1955   TUBAL LIGATION     VEIN SURGERY     Family History  Problem Relation Age of Onset   Heart disease Mother     Colon polyps Mother    Heart disease Father    Colitis Father    Colon polyps Father    Esophageal cancer Maternal Aunt    Colon cancer Neg Hx     Current Outpatient Medications:    alfuzosin (UROXATRAL) 10 MG 24 hr tablet, Take 10 mg by mouth daily., Disp: , Rfl:    atenolol  (TENORMIN ) 25 MG tablet, Take 0.5 tablets (12.5 mg total) by mouth daily as needed., Disp: 30 tablet, Rfl: 3   busPIRone (BUSPAR) 7.5 MG tablet, Take 1 tablet (7.5 mg total) by mouth 2 (two) times daily., Disp: 60 tablet, Rfl:  0   cephALEXin  (KEFLEX ) 500 MG capsule, Take 500 mg by mouth 2 (two) times daily., Disp: , Rfl:    clobetasol ointment (TEMOVATE) 0.05 %, Apply 1 Application topically 2 (two) times daily., Disp: , Rfl:    Coenzyme Q10 (CO Q10) 100 MG CAPS, Take by mouth daily., Disp: , Rfl:    estradiol (ESTRACE) 0.5 MG tablet, Take 0.5 mg by mouth daily., Disp: , Rfl:    hydrocortisone  (ANUSOL -HC) 2.5 % rectal cream, Place 1 Application rectally 2 (two) times daily., Disp: 30 g, Rfl: 0   Multiple Vitamin (MULTIVITAMIN) tablet, Take 1 tablet by mouth daily., Disp: , Rfl:    ondansetron  (ZOFRAN ) 4 MG tablet, Take 1 tablet (4 mg total) by mouth every 8 (eight) hours as needed for nausea or vomiting., Disp: 20 tablet, Rfl: 0   RESTASIS 0.05 % ophthalmic emulsion, , Disp: , Rfl:    rosuvastatin  (CRESTOR ) 10 MG tablet, TAKE 1 TABLET BY MOUTH ONCE DAILY **MUST SCHEDULE AND ATTEND ANNUAL APPOINTMENT FOR FUTURE REFILLS 1ST ATTEMPT, Disp: 90 tablet, Rfl: 3   silodosin (RAPAFLO) 4 MG CAPS capsule, Take 4 mg by mouth daily., Disp: , Rfl:    vitamin B-12 (CYANOCOBALAMIN ) 50 MCG tablet, Take 50 mcg by mouth daily., Disp: , Rfl:    zolpidem (AMBIEN) 5 MG tablet, Take 5 mg by mouth at bedtime as needed., Disp: , Rfl:    meclizine  (ANTIVERT ) 12.5 MG tablet, TAKE 1 TABLET BY MOUTH EVERY 6 -12 HOURS AS NEEDED FOR DIZZINESS (Patient taking differently: Take 12.5 mg by mouth as needed. TAKE 1 TABLET BY MOUTH EVERY 6 -12 HOURS AS  NEEDED FOR DIZZINESS), Disp: 30 tablet, Rfl: 1   metFORMIN  (GLUCOPHAGE -XR) 500 MG 24 hr tablet, TAKE 1 TABLET BY MOUTH AT BEDTIME (Patient not taking: Reported on 12/13/2023), Disp: 90 tablet, Rfl: 2   mirtazapine (REMERON) 7.5 MG tablet, Take 1 tablet (7.5 mg total) by mouth at bedtime. (Patient not taking: Reported on 12/13/2023), Disp: 30 tablet, Rfl: 1   nystatin cream (MYCOSTATIN), Apply 1 Application topically daily at 6 (six) AM. (Patient not taking: Reported on 12/21/2023), Disp: , Rfl:    omeprazole  (PRILOSEC) 20 MG capsule, Take 20 mg by mouth daily. (Patient not taking: Reported on 12/21/2023), Disp: , Rfl:    phenazopyridine (PYRIDIUM) 200 MG tablet, Take 200 mg by mouth 3 (three) times daily. (Patient not taking: Reported on 12/13/2023), Disp: , Rfl:    SAMBUCOL BLACK ELDERBERRY PO, Take 1 capsule by mouth 2 (two) times a week. (Patient not taking: Reported on 12/21/2023), Disp: , Rfl:    zolmitriptan  (ZOMIG -ZMT) 2.5 MG disintegrating tablet, TAKE 1 TABLET BY MOUTH AS NEEDED FOR MIGRAINE (Patient taking differently: Take 2.5 mg by mouth as needed. TAKE 1 TABLET BY MOUTH AS NEEDED FOR MIGRAINE), Disp: 10 tablet, Rfl: 4 Allergies  Allergen Reactions   Codeine    Compazine    Gabapentin    Lexapro [Escitalopram Oxalate]     Nausea, lightheadedness, diarrhea, palpitations   Ondansetron  Hcl Other (See Comments)   Other Other (See Comments)   Prednisone     REACTION: makes heart beat hard \\T \ fast   Prochlorperazine Edisylate    Sulfa Antibiotics Other (See Comments)   Sulfonamide Derivatives    Alfuzosin Hcl Er Palpitations     ROS: A complete ROS was performed with pertinent positives/negatives noted in the HPI. The remainder of the ROS are negative.    Objective:   Today's Vitals   12/21/23 1430  BP:  110/63  Pulse: 63  Temp: 98.7 F (37.1 C)  TempSrc: Temporal  SpO2: 99%  Weight: 110 lb (49.9 kg)  Height: 5' 1 (1.549 m)    GENERAL: Well-appearing, in NAD. Well  nourished.  SKIN: Pink, warm and dry.  RESPIRATORY: Chest wall symmetrical. Respirations even and non-labored.  PSYCH/MENTAL STATUS: Alert, oriented x 3. Cooperative, appropriate mood and affect.    No results found for any visits on 12/21/23.    Assessment & Plan:  Assessment and Plan    Anxiety disorder Anxiety exacerbated by stressors. Buspar ineffective.  - Recommended OTC Ashwagandha as needed - Continue Buspar 5mg  BID PRN - Continue Ambien for sleep PRN - Use Imodium as needed for diarrhea. - Maintain bland diet for GI symptoms.      No orders of the defined types were placed in this encounter.  No orders of the defined types were placed in this encounter.  Lab Orders  No laboratory test(s) ordered today   No images are attached to the encounter or orders placed in the encounter.  Return for Scheduled Routine Office Visits and as needed.   Rosina Senters, FNP

## 2023-12-21 NOTE — Telephone Encounter (Signed)
 Message routed to Dr. Berneta for advise.   Copied from CRM [id number]. Topic: Clinical - Prescription Issue >> Dec 21, 2023  8:43 AM Aleatha C wrote: Reason for CRM: Patient started her new dosage of busPIRone (BUSPAR) 7.5 MG tablet and it made her shaky and she thinks it may be too much for her, please contact to her to further discuss

## 2023-12-27 LAB — HEMOGLOBIN A1C
EGFR: 80
Hemoglobin A1C: 5.8

## 2023-12-27 LAB — LIPID PANEL
Cholesterol: 159 (ref 0–200)
HDL: 80 — AB (ref 35–70)
Triglycerides: 89 (ref 40–160)

## 2023-12-28 DIAGNOSIS — S42201A Unspecified fracture of upper end of right humerus, initial encounter for closed fracture: Secondary | ICD-10-CM | POA: Diagnosis not present

## 2023-12-29 NOTE — Therapy (Incomplete)
 OUTPATIENT PHYSICAL THERAPY SHOULDER EVALUATION   Patient Name: [name] MRN: [id number] DOB:[birth year], 73 y.o., female Today's Date: 12/29/2023  END OF SESSION:   Past Medical History:  Diagnosis Date   Anal fissure    Anemia    during pregnancy   Anxiety    Basal cell carcinoma    DDD (degenerative disc disease)    Diverticulosis    Hypokalemia    LBP (low back pain)    Melanoma (HCC)    Migraine    MVP (mitral valve prolapse) 1990   psa   Rosacea    Ulcer    as a child   Past Surgical History:  Procedure Laterality Date   ABDOMINAL HYSTERECTOMY  1985   BREAST BIOPSY Left    secondary to infection   I & D EXTREMITY Right 09/09/2012   Procedure: Minor IRRIGATION AND DEBRIDEMENT EXTREMITY paronychia of right thumb;  Surgeon: Arley JONELLE Curia, MD;  Location: [facility];  Service: Orthopedics;  Laterality: Right;   I & D EXTREMITY Left 01/26/2014   Procedure: MINOR IINCISION AND DRAINAGE paronychia left index finger;  Surgeon: Arley Curia, MD;  Location: [facility];  Service: Orthopedics;  Laterality: Left;  left index   OOPHORECTOMY Left 1994   REPAIR KNEE LIGAMENT Left 1989   TONSILLECTOMY  1955   TUBAL LIGATION     VEIN SURGERY     Patient Active Problem List   Diagnosis Date Noted   History of fall 12/02/2023   Post-viral cough syndrome 03/18/2023   Upper respiratory tract infection 02/26/2023   Nausea 02/26/2023   Pain of left hip 11/13/2022   Cervical strain 11/13/2022   Sciatica of right side 09/30/2022   B12 deficiency 09/30/2022   Anxiety 09/30/2022   Agatston coronary artery calcium  score between 200 and 399 08/03/2022   Osteopenia of necks of both femurs 06/23/2022   Dupuytren's contracture of both hands 06/23/2022   Low TSH level 05/20/2022   Prediabetes 05/20/2022   Cellulitis of finger of right hand 09/08/2021   Infection of skin 05/13/2021   Cervicalgia 05/06/2021   Grieving 05/06/2021   Conductive hearing  loss of right ear 03/04/2021   Impacted cerumen of right ear 03/04/2021   Medication side effect 03/03/2021   Excessive cerumen in right ear canal 03/03/2021   Labyrinthitis 07/30/2020   Palpitations 04/15/2020   Plantar fasciitis 04/15/2020   Dysthymia 04/15/2020   Vitamin D  deficiency 04/15/2020   Gastroesophageal reflux disease 04/15/2020   Insect bite of left foot 11/24/2019   Pustule 11/24/2019   Pain of right thumb 03/09/2019   External hemorrhoids 03/03/2018   Other chest pain 02/23/2018   Insomnia 02/24/2017   Healthcare maintenance 02/24/2017   Conjunctivitis 03/01/2014   Seborrheic keratosis, inflamed 01/05/2012   Menopausal syndrome 12/22/2011   Encounter for counseling 11/11/2011   Skin cancer 07/14/2011   Basal cell carcinoma of face 03/10/2011   Migraine 05/14/2007   IRRITABLE BOWEL SYNDROME 05/14/2007   History of mitral valve prolapse 05/14/2007   Allergic rhinitis 04/07/2007   DERMATITIS DUE TO SOLAR RADIATION 04/07/2007   Premature beat 09/20/2006   DIVERTICULOSIS, COLON 04/13/2002    PCP: Elsie Lent  REFERRING PROVIDER: Debby Fireman  REFERRING DIAG: D57.798J- unspecified fx of upper end of R humerus   THERAPY DIAG:  No diagnosis found.  Rationale for Evaluation and Treatment: Rehabilitation  ONSET DATE: 11/26/23  SUBJECTIVE:  SUBJECTIVE STATEMENT: *** Hand dominance: {MISC; OT HAND [phone]}  PERTINENT HISTORY: 12/28/23--Dr. Kay reviewed/interpreted ap/outlet views of right shoulder showing proximal greater tuberosity fracture in good position and healing recommend gentle physical therapy to return rom and strength f/u in 4 weeks for recheck and repeat x-rays pt and family in agreement denies need for any pain medication   11/26/23- patient suffered  a fall and hit the right side of her head and shoulder. Resulted in a non displaced fracture of the greater tuberosity of R humerus.   PAIN:  Are you having pain? {OPRCPAIN:27236}  PRECAUTIONS: {Therapy precautions:24002}  RED FLAGS: {PT Red Flags:29287}   WEIGHT BEARING RESTRICTIONS: {Yes ***/No:24003}  FALLS:  Has patient fallen in last 6 months? {fallsyesno:27318}  LIVING ENVIRONMENT: Lives with: {OPRC lives with:25569::lives with their family} Lives in: {Lives in:25570} Stairs: {opstairs:27293} Has following equipment at home: {Assistive devices:23999}  OCCUPATION: ***  PLOF: {PLOF:24004}  PATIENT GOALS:***  NEXT MD VISIT:   OBJECTIVE:  Note: Objective measures were completed at Evaluation unless otherwise noted.  DIAGNOSTIC FINDINGS:  ***  PATIENT SURVEYS:  {rehab surveys:24030:a}  COGNITION: Overall cognitive status: {cognition:24006}     SENSATION: {sensation:27233}  POSTURE: ***  UPPER EXTREMITY ROM:   {AROM/PROM:27142} ROM Right eval Left eval  Shoulder flexion    Shoulder extension    Shoulder abduction    Shoulder adduction    Shoulder internal rotation    Shoulder external rotation    Elbow flexion    Elbow extension    Wrist flexion    Wrist extension    Wrist ulnar deviation    Wrist radial deviation    Wrist pronation    Wrist supination    (Blank rows = not tested)  UPPER EXTREMITY MMT:  MMT Right eval Left eval  Shoulder flexion    Shoulder extension    Shoulder abduction    Shoulder adduction    Shoulder internal rotation    Shoulder external rotation    Middle trapezius    Lower trapezius    Elbow flexion    Elbow extension    Wrist flexion    Wrist extension    Wrist ulnar deviation    Wrist radial deviation    Wrist pronation    Wrist supination    Grip strength (lbs)    (Blank rows = not tested)  SHOULDER SPECIAL TESTS: Impingement tests: {shoulder impingement test:25231:a} SLAP lesions: {SLAP  lesions:25232} Instability tests: {shoulder instability test:25233} Rotator cuff assessment: {rotator cuff assessment:25234} Biceps assessment: {biceps assessment:25235}  JOINT MOBILITY TESTING:  ***  PALPATION:  ***                                                                                                                             TREATMENT DATE: ***   PATIENT EDUCATION: Education details: *** Person educated: {Person educated:25204} Education method: {Education Method:25205} Education comprehension: {Education Comprehension:25206}  HOME EXERCISE PROGRAM: ***  ASSESSMENT:  CLINICAL IMPRESSION: Patient is a ***  y.o. *** who was seen today for physical therapy evaluation and treatment for ***.   OBJECTIVE IMPAIRMENTS: {opptimpairments:25111}.   ACTIVITY LIMITATIONS: {activitylimitations:27494}  PARTICIPATION LIMITATIONS: {participationrestrictions:25113}  PERSONAL FACTORS: {Personal factors:25162} are also affecting patient's functional outcome.   REHAB POTENTIAL: {rehabpotential:25112}  CLINICAL DECISION MAKING: {clinical decision making:25114}  EVALUATION COMPLEXITY: {Evaluation complexity:25115}   GOALS: Goals reviewed with patient? {yes/no:20286}  SHORT TERM GOALS: Target date: ***  *** Baseline: Goal status: INITIAL  2.  *** Baseline:  Goal status: INITIAL  3.  *** Baseline:  Goal status: INITIAL  4.  *** Baseline:  Goal status: INITIAL  5.  *** Baseline:  Goal status: INITIAL  6.  *** Baseline:  Goal status: INITIAL  LONG TERM GOALS: Target date: ***  *** Baseline:  Goal status: INITIAL  2.  *** Baseline:  Goal status: INITIAL  3.  *** Baseline:  Goal status: INITIAL  4.  *** Baseline:  Goal status: INITIAL  5.  *** Baseline:  Goal status: INITIAL  6.  *** Baseline:  Goal status: INITIAL  PLAN:  PT FREQUENCY: {rehab frequency:25116}  PT DURATION: {rehab duration:25117}  PLANNED INTERVENTIONS: {rehab  planned interventions:25118::97110-Therapeutic exercises,97530- Therapeutic [phone]- Neuromuscular re-education,97535- Self Rjmz,02859- Manual therapy,Patient/Family education}  PLAN FOR NEXT SESSION: ***   Almetta Fam, PT 12/29/2023, 10:30 AM

## 2023-12-30 ENCOUNTER — Encounter (HOSPITAL_BASED_OUTPATIENT_CLINIC_OR_DEPARTMENT_OTHER): Payer: Self-pay | Admitting: Emergency Medicine

## 2023-12-30 ENCOUNTER — Other Ambulatory Visit: Payer: Self-pay

## 2023-12-30 ENCOUNTER — Emergency Department (HOSPITAL_BASED_OUTPATIENT_CLINIC_OR_DEPARTMENT_OTHER)
Admission: EM | Admit: 2023-12-30 | Discharge: 2023-12-30 | Disposition: A | Discharge status: Home / Self Care | Attending: Emergency Medicine | Admitting: Emergency Medicine

## 2023-12-30 ENCOUNTER — Ambulatory Visit: Payer: Self-pay | Admitting: *Deleted

## 2023-12-30 ENCOUNTER — Ambulatory Visit

## 2023-12-30 ENCOUNTER — Encounter: Payer: Self-pay | Admitting: Family Medicine

## 2023-12-30 ENCOUNTER — Emergency Department (HOSPITAL_BASED_OUTPATIENT_CLINIC_OR_DEPARTMENT_OTHER)

## 2023-12-30 DIAGNOSIS — R197 Diarrhea, unspecified: Secondary | ICD-10-CM | POA: Insufficient documentation

## 2023-12-30 DIAGNOSIS — F419 Anxiety disorder, unspecified: Secondary | ICD-10-CM | POA: Insufficient documentation

## 2023-12-30 DIAGNOSIS — R739 Hyperglycemia, unspecified: Secondary | ICD-10-CM | POA: Insufficient documentation

## 2023-12-30 DIAGNOSIS — R109 Unspecified abdominal pain: Secondary | ICD-10-CM | POA: Diagnosis not present

## 2023-12-30 DIAGNOSIS — R1013 Epigastric pain: Secondary | ICD-10-CM | POA: Insufficient documentation

## 2023-12-30 DIAGNOSIS — E860 Dehydration: Secondary | ICD-10-CM | POA: Insufficient documentation

## 2023-12-30 DIAGNOSIS — D72829 Elevated white blood cell count, unspecified: Secondary | ICD-10-CM | POA: Diagnosis not present

## 2023-12-30 DIAGNOSIS — Z7984 Long term (current) use of oral hypoglycemic drugs: Secondary | ICD-10-CM | POA: Insufficient documentation

## 2023-12-30 DIAGNOSIS — K5730 Diverticulosis of large intestine without perforation or abscess without bleeding: Secondary | ICD-10-CM | POA: Diagnosis not present

## 2023-12-30 DIAGNOSIS — N200 Calculus of kidney: Secondary | ICD-10-CM | POA: Diagnosis not present

## 2023-12-30 LAB — LIPASE, BLOOD: Lipase: 48 U/L (ref 11–51)

## 2023-12-30 LAB — COMPREHENSIVE METABOLIC PANEL WITH GFR
ALT: 24 U/L (ref 0–44)
AST: 32 U/L (ref 15–41)
Albumin: 4.5 g/dL (ref 3.5–5.0)
Alkaline Phosphatase: 83 U/L (ref 38–126)
Anion gap: 10 (ref 5–15)
BUN: 11 mg/dL (ref 8–23)
CO2: 28 mmol/L (ref 22–32)
Calcium: 11.4 mg/dL — ABNORMAL HIGH (ref 8.9–10.3)
Chloride: 98 mmol/L (ref 98–111)
Creatinine, Ser: 0.66 mg/dL (ref 0.44–1.00)
GFR, Estimated: >60 mL/min (ref >60)
Glucose, Bld: 188 mg/dL — ABNORMAL HIGH (ref 70–99)
Potassium: 5.1 mmol/L (ref 3.5–5.1)
Sodium: 136 mmol/L (ref 135–145)
Total Bilirubin: 0.4 mg/dL (ref 0.0–1.2)
Total Protein: 8 g/dL (ref 6.5–8.1)

## 2023-12-30 LAB — CBC
HCT: 41.5 % (ref 36.0–46.0)
Hemoglobin: 14 g/dL (ref 12.0–15.0)
MCH: 31.7 pg (ref 26.0–34.0)
MCHC: 33.7 g/dL (ref 30.0–36.0)
MCV: 94.1 fL (ref 80.0–100.0)
Platelets: 338 K/uL (ref 150–400)
RBC: 4.41 MIL/uL (ref 3.87–5.11)
RDW: 13.6 % (ref 11.5–15.5)
WBC: 12.3 K/uL — ABNORMAL HIGH (ref 4.0–10.5)
nRBC: 0 % (ref 0.0–0.2)

## 2023-12-30 LAB — URINALYSIS, ROUTINE W REFLEX MICROSCOPIC
Bilirubin Urine: NEGATIVE
Glucose, UA: 100 mg/dL — AB
Hgb urine dipstick: NEGATIVE
Ketones, ur: NEGATIVE mg/dL
Leukocytes,Ua: NEGATIVE
Nitrite: NEGATIVE
Protein, ur: NEGATIVE mg/dL
Specific Gravity, Urine: <1.005 — ABNORMAL LOW (ref 1.005–1.030)
pH: 6.5 (ref 5.0–8.0)

## 2023-12-30 MED ORDER — LORAZEPAM 1 MG PO TABS
0.5000 mg | ORAL_TABLET | Freq: Once | ORAL | Status: AC
  Administered 2023-12-30: 0.5 mg via ORAL
  Filled 2023-12-30: qty 1

## 2023-12-30 MED ORDER — LORAZEPAM 1 MG PO TABS: 0.5000 mg | ORAL_TABLET | Freq: Two times a day (BID) | ORAL | 0 refills | Status: DC | PRN

## 2023-12-30 MED ORDER — LACTATED RINGERS IV BOLUS
1000.0000 mL | Freq: Once | INTRAVENOUS | Status: AC
  Administered 2023-12-30: 1000 mL via INTRAVENOUS

## 2023-12-30 MED ORDER — IOHEXOL 300 MG/ML  SOLN
100.0000 mL | Freq: Once | INTRAMUSCULAR | Status: AC | PRN
  Administered 2023-12-30: 85 mL via INTRAVENOUS

## 2023-12-30 MED ORDER — DIPHENHYDRAMINE HCL 50 MG/ML IJ SOLN
12.5000 mg | Freq: Once | INTRAMUSCULAR | Status: AC
Start: 2023-12-30 — End: 2023-12-30
  Administered 2023-12-30: 12.5 mg via INTRAVENOUS
  Filled 2023-12-30: qty 1

## 2023-12-30 NOTE — Discharge Instructions (Signed)
 Stay hydrated and drink plenty of fluids over the next few days.  Keep diet bland wants appearance and symptoms.  May take Imodium 2-3 times daily as needed for diarrhea.  May start taking daily Ensure shakes or other protein shakes to help with caloric intake.  Follow-up with PCP in 1 week if no improvement.  May take Ativan  2-3 times daily as needed for any anxiety.  Do not take Xanax at the same time.  Follow-up with therapy as scheduled tomorrow.  Return to ED sooner if any symptoms worsen including new fevers, severe abdominal pain, uncontrollable nausea/vomiting/diarrhea, blood in the diarrhea.

## 2023-12-30 NOTE — ED Triage Notes (Signed)
 Reports diarrhea and lethargy x 2 days. Denies ABD pain or fevers.

## 2023-12-30 NOTE — Telephone Encounter (Signed)
 FYI Only or Action Required?: Action required by provider: request for appointment.  Patient was last seen in primary care on 12/21/2023 by Billy Knee, FNP.  Called Nurse Triage reporting Diarrhea.  Symptoms began several days ago.  Interventions attempted: OTC medications: imodium.  Symptoms are: gradually worsening.  Triage Disposition: See Physician Within 24 Hours  Patient/caregiver understands and will follow disposition?: No, wishes to speak with PCP   Reason for Triage: Patient has been experiencing diarrhea all week with no improvement to her symptoms.  Reason for Disposition  [1] MODERATE diarrhea (e.g., 4-6 times / day more than normal) AND [2] age > 70 years  Answer Assessment - Initial Assessment Questions Patient is calling to request appointment today- patient states her anxiety is high and she needs it to be calmer. She has started having diarrhea and is making herself eat. Patient states she does have Rx for anxiety- but it is very strong and partial pill puts her to sleep. Patient did take half imodium today for the diarrhea.    1. DIARRHEA SEVERITY: How bad is the diarrhea? How many more stools have you had in the past 24 hours than normal?      4 stools 2. ONSET: When did the diarrhea begin?      Off/on this week- worse today 3. STOOL DESCRIPTION:  How loose or watery is the diarrhea? What is the stool color? Is there any blood or mucous in the stool?     watery 4. VOMITING: Are you also vomiting? If Yes, ask: How many times in the past 24 hours?      no 5. ABDOMEN PAIN: Are you having any abdomen pain? If Yes, ask: What does it feel like? (e.g., crampy, dull, intermittent, constant)      no 6. ABDOMEN PAIN SEVERITY: If present, ask: How bad is the pain?  (e.g., Scale 1-10; mild, moderate, or severe)     no 7. ORAL INTAKE: If vomiting, Have you been able to drink liquids? How much liquids have you had in the past 24 hours?      Patient is making herself eat and hydrate 8. HYDRATION: Any signs of dehydration? (e.g., dry mouth [not just dry lips], too weak to stand, dizziness, new weight loss) When did you last urinate?     Weight loss, dry mouth, slight dizziness- recent injury 9. EXPOSURE: Have you traveled to a foreign country recently? Have you been exposed to anyone with diarrhea? Could you have eaten any food that was spoiled?     no 10. ANTIBIOTIC USE: Are you taking antibiotics now or have you taken antibiotics in the past 2 months?       no 11. OTHER SYMPTOMS: Do you have any other symptoms? (e.g., fever, blood in stool)       anxious  Protocols used: Diarrhea-A-AH

## 2023-12-30 NOTE — Telephone Encounter (Signed)
 Copied from CRM #[id number]. Topic: Clinical - Pink Word Triage >> Dec 30, 2023  8:29 AM Franky GRADE wrote: [name] triggered transfer to Nurse Triage. See Triage Message for details. >> Dec 30, 2023 10:16 AM Drema MATSU wrote: Patient is calling to check on request. Patient is requesting a callback if she can or cannot be seen today. >> Dec 30, 2023  8:30 AM Franky GRADE wrote: Reason for Triage: Patient has been experiencing diarrhea all week with no improvement to her symptoms.  Left message on vmail per HIPAA to call in to see if there are any other [facility] proactices that have any opening or report to UC if diarrhea is persistant. Greer Norse, CMA II

## 2023-12-30 NOTE — ED Provider Notes (Signed)
 [place] EMERGENCY DEPARTMENT AT [facility] Provider Note   CSN: [id number] Arrival date & time: 12/30/23  1305     Patient presents with: Diarrhea   [name] is a 73 y.o. female.   Patient is a 61 female with a history of IBS, diverticulosis, GERD who presents to the ED for diarrhea and weakness for the past 2 days.  Patient notes she had some mild diarrhea yesterday that increased today.  She notes she has had several watery episodes.  States she took Imodium earlier that did help with the diarrhea.  She also took an antiacid medicine.  She states she has had some pain in the center of her abdomen that feels like her indigestion but no other pain otherwise.  No previous abdominal surgeries.  States she has also had anxiety increasing since her shoulder surgery a couple months ago.  She takes Ambien at nighttime.  She states she has tried Xanax and Lexapro  but was unable to tolerate them as they make her drowsy.  She states any medication she takes makes her very drowsy and she is already very drowsy.  She notes a decreased appetite and just feels weak all over.  Denies fevers, chills, headache, chest pain, shortness of breath, nausea/vomiting, urinary symptoms.  No further complaints.   Diarrhea Associated symptoms: abdominal pain   Associated symptoms: no chills, no fever, no headaches and no vomiting        Prior to Admission medications   Medication Sig Start Date End Date Taking? Authorizing Provider  ALPRAZolam (XANAX) 0.25 MG tablet Take by mouth 2 (two) times daily as needed. 12/22/23  Yes [provider]  LORazepam  (ATIVAN ) 1 MG tablet Take 0.5 tablets (0.5 mg total) by mouth 2 (two) times daily as needed for anxiety. 12/30/23  Yes Jehan Ranganathan S, PA-C  TRYPTYR 0.003 % SOLN  12/18/23  Yes [provider]  venlafaxine (EFFEXOR) 37.5 MG tablet Take 18.75 mg by mouth 2 (two) times daily. 12/25/23  Yes [provider]  alfuzosin  (UROXATRAL) 10 MG 24 hr tablet Take 10 mg by mouth daily. 11/08/23   [provider]  atenolol  (TENORMIN ) 25 MG tablet Take 0.5 tablets (12.5 mg total) by mouth daily as needed. 10/01/23   Croitoru, Mihai, MD  busPIRone  (BUSPAR ) 7.5 MG tablet Take 1 tablet (7.5 mg total) by mouth 2 (two) times daily. 12/20/23   [name], MD  cephALEXin  (KEFLEX ) 500 MG capsule Take 500 mg by mouth 2 (two) times daily. 11/15/23   [provider]  clobetasol ointment (TEMOVATE) 0.05 % Apply 1 Application topically 2 (two) times daily.    [provider]  Coenzyme Q10 (CO Q10) 100 MG CAPS Take by mouth daily.    [provider]  estradiol (ESTRACE) 0.5 MG tablet Take 0.5 mg by mouth daily.    [provider]  hydrocortisone  (ANUSOL -HC) 2.5 % rectal cream Place 1 Application rectally 2 (two) times daily. 04/13/23   [name], MD  meclizine  (ANTIVERT ) 12.5 MG tablet TAKE 1 TABLET BY MOUTH EVERY 6 -12 HOURS AS NEEDED FOR DIZZINESS Patient taking differently: Take 12.5 mg by mouth as needed. TAKE 1 TABLET BY MOUTH EVERY 6 -12 HOURS AS NEEDED FOR DIZZINESS 07/26/23   [name], MD  metFORMIN  (GLUCOPHAGE -XR) 500 MG 24 hr tablet TAKE 1 TABLET BY MOUTH AT BEDTIME Patient not taking: Reported on 12/13/2023 07/02/23   [name], MD  mirtazapine  (REMERON ) 7.5 MG tablet Take  1 tablet (7.5 mg total) by mouth at bedtime. Patient not taking: Reported on 12/13/2023 12/09/23   [name], MD  Multiple Vitamin (MULTIVITAMIN) tablet Take 1 tablet by mouth daily.    [provider]  nystatin cream (MYCOSTATIN) Apply 1 Application topically daily at 6 (six) AM. Patient not taking: Reported on 12/21/2023 02/25/23   [provider]  omeprazole  (PRILOSEC) 20 MG capsule Take 20 mg by mouth daily. Patient not taking: Reported on 12/21/2023    [provider]  ondansetron  (ZOFRAN ) 4 MG tablet Take 1 tablet (4 mg total)  by mouth every 8 (eight) hours as needed for nausea or vomiting. 12/02/23   [name], MD  phenazopyridine (PYRIDIUM) 200 MG tablet Take 200 mg by mouth 3 (three) times daily. Patient not taking: Reported on 12/13/2023 11/04/23   [provider]  RESTASIS 0.05 % ophthalmic emulsion  04/02/21   [provider]  rosuvastatin  (CRESTOR ) 10 MG tablet TAKE 1 TABLET BY MOUTH ONCE DAILY **MUST SCHEDULE AND ATTEND ANNUAL APPOINTMENT FOR FUTURE REFILLS 1ST ATTEMPT 10/26/23   Croitoru, Mihai, MD  SAMBUCOL BLACK ELDERBERRY PO Take 1 capsule by mouth 2 (two) times a week. Patient not taking: Reported on 12/21/2023    [provider]  silodosin (RAPAFLO) 4 MG CAPS capsule Take 4 mg by mouth daily. 11/09/23   [provider]  vitamin B-12 (CYANOCOBALAMIN ) 50 MCG tablet Take 50 mcg by mouth daily.    [provider]  zolmitriptan  (ZOMIG -ZMT) 2.5 MG disintegrating tablet TAKE 1 TABLET BY MOUTH AS NEEDED FOR MIGRAINE Patient taking differently: Take 2.5 mg by mouth as needed. TAKE 1 TABLET BY MOUTH AS NEEDED FOR MIGRAINE 06/15/23   [name], MD  zolpidem (AMBIEN) 5 MG tablet Take 5 mg by mouth at bedtime as needed. 12/10/23   [provider]    Allergies: Codeine, Compazine, Gabapentin, Lexapro  [escitalopram  oxalate], Ondansetron  hcl, Other, Prednisone, Prochlorperazine edisylate, Sulfa antibiotics, Sulfonamide derivatives, and Alfuzosin hcl er    Review of Systems  Constitutional:  Negative for chills and fever.  Respiratory:  Negative for shortness of breath.   Cardiovascular:  Negative for chest pain.  Gastrointestinal:  Positive for abdominal pain and diarrhea. Negative for nausea and vomiting.  Genitourinary:  Negative for dysuria.  Neurological:  Negative for headaches.  All other systems reviewed and are negative.   Updated Vital Signs BP 138/66   Pulse 80   Temp 98.6 F (37 C) (Oral)   Resp 18   SpO2 100%   Physical  Exam Constitutional:      Appearance: Normal appearance.  HENT:     Head: Normocephalic and atraumatic.     Nose: Nose normal.     Mouth/Throat:     Mouth: Mucous membranes are moist.     Pharynx: Oropharynx is clear.  Cardiovascular:     Rate and Rhythm: Normal rate.  Pulmonary:     Effort: Pulmonary effort is normal.  Abdominal:     General: Bowel sounds are normal.     Palpations: Abdomen is soft.     Tenderness: There is abdominal tenderness.     Comments: Mild epigastric tenderness.  No guarding masses or distention.  Musculoskeletal:        General: Normal range of motion.     Comments: Ambulates well with no difficulties  Skin:    General: Skin is warm and dry.  Neurological:     Mental Status: She is alert and oriented to person,  place, and time.  Psychiatric:        Mood and Affect: Mood normal.        Behavior: Behavior normal.     (all labs ordered are listed, but only abnormal results are displayed) Labs Reviewed  COMPREHENSIVE METABOLIC PANEL WITH GFR - Abnormal; Notable for the following components:      Result Value   Glucose, Bld 188 (*)    Calcium  11.4 (*)    All other components within normal limits  CBC - Abnormal; Notable for the following components:   WBC 12.3 (*)    All other components within normal limits  URINALYSIS, ROUTINE W REFLEX MICROSCOPIC - Abnormal; Notable for the following components:   Color, Urine COLORLESS (*)    Specific Gravity, Urine <1.005 (*)    Glucose, UA 100 (*)    All other components within normal limits  LIPASE, BLOOD    EKG: None  Radiology: CT ABDOMEN PELVIS W CONTRAST Result Date: 12/30/2023 CLINICAL DATA:  Acute abdominal pain.  Diarrhea. EXAM: CT ABDOMEN AND PELVIS WITH CONTRAST TECHNIQUE: Multidetector CT imaging of the abdomen and pelvis was performed using the standard protocol following bolus administration of intravenous contrast. RADIATION DOSE REDUCTION: This exam was performed according to the  departmental dose-optimization program which includes automated exposure control, adjustment of the mA and/or kV according to patient size and/or use of iterative reconstruction technique. CONTRAST:  85mL OMNIPAQUE  IOHEXOL  300 MG/ML  SOLN COMPARISON:  CT abdomen and pelvis 11/07/2023 FINDINGS: Lower chest: No acute abnormality. Hepatobiliary: No focal liver abnormality is seen. No gallstones, gallbladder wall thickening, or biliary dilatation. Pancreas: Unremarkable. No pancreatic ductal dilatation or surrounding inflammatory changes. Spleen: Normal in size without focal abnormality. Adrenals/Urinary Tract: There is a punctate calculus in the inferior pole the right kidney. Otherwise, the kidneys, adrenal glands and bladder are within normal limits. Stomach/Bowel: Stomach is within normal limits. Appendix appears normal. No evidence of bowel wall thickening, distention, or inflammatory changes. There is sigmoid colon diverticulosis. Vascular/Lymphatic: No significant vascular findings are present. No enlarged abdominal or pelvic lymph nodes. Reproductive: Status post hysterectomy. No adnexal masses. Other: No abdominal wall hernia or abnormality. No abdominopelvic ascites. Musculoskeletal: No acute or significant osseous findings. IMPRESSION: 1. No acute localizing process in the abdomen or pelvis. 2. Nonobstructing right renal calculus. 3. Sigmoid colon diverticulosis. Electronically Signed   By: Greig Pique M.D.   On: 12/30/2023 16:29      Medications Ordered in the ED  lactated ringers  bolus 1,000 mL (0 mLs Intravenous Stopped 12/30/23 1600)  diphenhydrAMINE  (BENADRYL ) injection 12.5 mg (12.5 mg Intravenous Given 12/30/23 1459)  iohexol  (OMNIPAQUE ) 300 MG/ML solution 100 mL (85 mLs Intravenous Contrast Given 12/30/23 1556)  LORazepam  (ATIVAN ) tablet 0.5 mg (0.5 mg Oral Given 12/30/23 1636)                                   Medical Decision Making Amount and/or Complexity of Data Reviewed Labs:  ordered. Radiology: ordered.  Risk Prescription drug management.   Patient is a 73 year old female with a history of diverticulitis, IBS, anxiety who presents to the ED for diarrhea and increasing anxiety that began yesterday.  Please see detailed HPI above.  On exam patient is alert and in no acute distress.  Physical exam as noted above.  Mild epigastric tenderness but otherwise no acute tenderness noted.  Mild leukocytosis of 12.3 on labs.  Hyperglycemia 188 but  labs otherwise reassuring.  UA shows mild dehydration but no acute signs of infection.  CT abdomen pelvis reviewed, no acute process appreciated.  There is right renal stones and diverticulosis but no acute abnormalities.  EKG shows sinus rhythm, less concerns for ACS.  Differential includes acute viral illness, GERD, gastritis, IBS, IBD, acute abdomen.  Suspect patient's symptoms are secondary to GERD versus IBS.  Patient notes diarrhea had stopped after Imodium earlier and she is tolerating oral intake while in ED.  She was given a liter of fluids as well as Benadryl  and Ativan  for anxiety while in ED.  Otherwise stable for discharge home.  Advised to continue Imodium as needed for diarrhea.  Symptomatic care discussed.  Advised to follow-up with therapist as scheduled tomorrow for follow-up and PCP in 1 week for any continued symptoms.  Return precautions provided for worsening symptoms.   Final diagnoses:  Epigastric pain  Diarrhea, unspecified type  Anxiety    ED Discharge Orders          Ordered    LORazepam  (ATIVAN ) 1 MG tablet  2 times daily PRN        12/30/23 1742               Neysa Thersia RAMAN, PA-C 12/30/23 2029    Pamella Ozell LABOR, DO 12/31/23 0016

## 2023-12-31 DIAGNOSIS — F331 Major depressive disorder, recurrent, moderate: Secondary | ICD-10-CM | POA: Diagnosis not present

## 2024-01-03 ENCOUNTER — Ambulatory Visit (INDEPENDENT_AMBULATORY_CARE_PROVIDER_SITE_OTHER): Admitting: Family Medicine

## 2024-01-03 ENCOUNTER — Ambulatory Visit: Payer: Self-pay

## 2024-01-03 ENCOUNTER — Encounter: Payer: Self-pay | Admitting: Family Medicine

## 2024-01-03 VITALS — BP 132/70 | HR 69 | Temp 97.6°F | Ht 61.0 in | Wt 107.0 lb

## 2024-01-03 DIAGNOSIS — F418 Other specified anxiety disorders: Secondary | ICD-10-CM | POA: Diagnosis not present

## 2024-01-03 MED ORDER — LORAZEPAM 1 MG PO TABS: ORAL_TABLET | ORAL | 1 refills | Status: DC

## 2024-01-03 NOTE — Progress Notes (Signed)
 Follow-up follow-up well and follow-up in 1 year okay for increasing anxiety and try to increase daily exercise, she is currently going back to Effexor 75 mg she just came to you  Established Patient Office Visit   Subjective:  Patient ID: [name], female    DOB: 12/20/1950  Age: 73 y.o. MRN: [id number]  Chief Complaint  Patient presents with   Anxiety    Pt states she is having increased anxiety. Pt states she needs medications. Pt states she is having racing heart beat and cotton mouth. Pt was prescribed Lorazepam  1mg .     Anxiety Symptoms include nervous/anxious behavior.     Encounter Diagnoses  Name Primary?   Anxiety with depression Yes   Anxiety and sadness persist.  Have tried multiple SSRIs, buspirone .  She has complained of various side effects.  Alprazolam has caused drowsiness.  Continues with Ambien at at bedtime for sleep.  She is seeing a psychologist.  Insight into her anxiety and sadness is limited.  Was seen in the emergency room and given lorazepam .  There has been some drowsiness with that medication.   Review of Systems  Constitutional: Negative.   HENT: Negative.    Eyes:  Negative for blurred vision, discharge and redness.  Respiratory: Negative.    Cardiovascular: Negative.   Gastrointestinal:  Negative for abdominal pain.  Genitourinary: Negative.   Musculoskeletal: Negative.  Negative for myalgias.  Skin:  Negative for rash.  Neurological:  Negative for tingling, loss of consciousness and weakness.  Endo/Heme/Allergies:  Negative for polydipsia.  Psychiatric/Behavioral:  Positive for depression. The patient is nervous/anxious.       12/13/2023   11:30 AM 12/09/2023    8:50 AM 11/15/2023    8:21 AM  Depression screen PHQ 2/9  Decreased Interest 1 1 0  Down, Depressed, Hopeless 0 1 0  PHQ - 2 Score 1 2 0  Altered sleeping 0 3   Tired, decreased energy 0 1   Change in appetite 0 2   Feeling bad or failure about yourself  1 0   Trouble  concentrating 1 1   Moving slowly or fidgety/restless 0 0   Suicidal thoughts 0 0   PHQ-9 Score 3  9    Difficult doing work/chores Somewhat difficult Somewhat difficult      Data saved with a previous flowsheet row definition      Current Outpatient Medications:    LORazepam  (ATIVAN ) 1 MG tablet, May take 1/2 to 1 tablet twice daily as needed, Disp: 30 tablet, Rfl: 1   alfuzosin (UROXATRAL) 10 MG 24 hr tablet, Take 10 mg by mouth daily., Disp: , Rfl:    atenolol  (TENORMIN ) 25 MG tablet, Take 0.5 tablets (12.5 mg total) by mouth daily as needed., Disp: 30 tablet, Rfl: 3   busPIRone  (BUSPAR ) 7.5 MG tablet, Take 1 tablet (7.5 mg total) by mouth 2 (two) times daily., Disp: 60 tablet, Rfl: 0   cephALEXin  (KEFLEX ) 500 MG capsule, Take 500 mg by mouth 2 (two) times daily., Disp: , Rfl:    clobetasol ointment (TEMOVATE) 0.05 %, Apply 1 Application topically 2 (two) times daily., Disp: , Rfl:    Coenzyme Q10 (CO Q10) 100 MG CAPS, Take by mouth daily., Disp: , Rfl:    estradiol (ESTRACE) 0.5 MG tablet, Take 0.5 mg by mouth daily., Disp: , Rfl:    hydrocortisone  (ANUSOL -HC) 2.5 % rectal cream, Place 1 Application rectally 2 (two) times daily., Disp: 30 g, Rfl: 0   meclizine  (ANTIVERT )  12.5 MG tablet, TAKE 1 TABLET BY MOUTH EVERY 6 -12 HOURS AS NEEDED FOR DIZZINESS (Patient taking differently: Take 12.5 mg by mouth as needed. TAKE 1 TABLET BY MOUTH EVERY 6 -12 HOURS AS NEEDED FOR DIZZINESS), Disp: 30 tablet, Rfl: 1   metFORMIN  (GLUCOPHAGE -XR) 500 MG 24 hr tablet, TAKE 1 TABLET BY MOUTH AT BEDTIME (Patient not taking: Reported on 12/13/2023), Disp: 90 tablet, Rfl: 2   Multiple Vitamin (MULTIVITAMIN) tablet, Take 1 tablet by mouth daily., Disp: , Rfl:    nystatin cream (MYCOSTATIN), Apply 1 Application topically daily at 6 (six) AM. (Patient not taking: Reported on 12/21/2023), Disp: , Rfl:    omeprazole  (PRILOSEC) 20 MG capsule, Take 20 mg by mouth daily. (Patient not taking: Reported on 12/21/2023),  Disp: , Rfl:    ondansetron  (ZOFRAN ) 4 MG tablet, Take 1 tablet (4 mg total) by mouth every 8 (eight) hours as needed for nausea or vomiting., Disp: 20 tablet, Rfl: 0   phenazopyridine (PYRIDIUM) 200 MG tablet, Take 200 mg by mouth 3 (three) times daily. (Patient not taking: Reported on 12/13/2023), Disp: , Rfl:    RESTASIS 0.05 % ophthalmic emulsion, , Disp: , Rfl:    rosuvastatin  (CRESTOR ) 10 MG tablet, TAKE 1 TABLET BY MOUTH ONCE DAILY **MUST SCHEDULE AND ATTEND ANNUAL APPOINTMENT FOR FUTURE REFILLS 1ST ATTEMPT, Disp: 90 tablet, Rfl: 3   SAMBUCOL BLACK ELDERBERRY PO, Take 1 capsule by mouth 2 (two) times a week. (Patient not taking: Reported on 12/21/2023), Disp: , Rfl:    silodosin (RAPAFLO) 4 MG CAPS capsule, Take 4 mg by mouth daily., Disp: , Rfl:    TRYPTYR 0.003 % SOLN, , Disp: , Rfl:    venlafaxine (EFFEXOR) 37.5 MG tablet, Take 18.75 mg by mouth 2 (two) times daily., Disp: , Rfl:    vitamin B-12 (CYANOCOBALAMIN ) 50 MCG tablet, Take 50 mcg by mouth daily., Disp: , Rfl:    zolmitriptan  (ZOMIG -ZMT) 2.5 MG disintegrating tablet, TAKE 1 TABLET BY MOUTH AS NEEDED FOR MIGRAINE (Patient taking differently: Take 2.5 mg by mouth as needed. TAKE 1 TABLET BY MOUTH AS NEEDED FOR MIGRAINE), Disp: 10 tablet, Rfl: 4   zolpidem (AMBIEN) 5 MG tablet, Take 5 mg by mouth at bedtime as needed., Disp: , Rfl:    Objective:     BP 132/70 (BP Location: Left Arm, Patient Position: Sitting, Cuff Size: Normal)   Pulse 69   Temp 97.6 F (36.4 C) (Oral)   Ht 5' 1 (1.549 m)   Wt 107 lb (48.5 kg)   SpO2 97%   BMI 20.22 kg/m    Physical Exam Constitutional:      General: She is not in acute distress.    Appearance: Normal appearance. She is not ill-appearing, toxic-appearing or diaphoretic.  HENT:     Head: Normocephalic and atraumatic.     Right Ear: External ear normal.     Left Ear: External ear normal.  Eyes:     General: No scleral icterus.       Right eye: No discharge.        Left eye: No  discharge.     Extraocular Movements: Extraocular movements intact.     Conjunctiva/sclera: Conjunctivae normal.  Pulmonary:     Effort: Pulmonary effort is normal. No respiratory distress.  Skin:    General: Skin is warm and dry.  Neurological:     Mental Status: She is alert and oriented to person, place, and time.  Psychiatric:  Mood and Affect: Mood normal.        Behavior: Behavior normal.      No results found for any visits on 01/03/24.    The 10-year ASCVD risk score (Arnett DK, et al., 2019) is: 21.9%* (Cholesterol units were assumed)    Assessment & Plan:   Anxiety with depression -     Ambulatory referral to Psychiatry -     LORazepam ; May take 1/2 to 1 tablet twice daily as needed  Dispense: 30 tablet; Refill: 1    Return As previously recommended..  Continue all other medications as directed.  Elsie Sim Lent, MD

## 2024-01-03 NOTE — Telephone Encounter (Signed)
 FYI Only or Action Required?: FYI only for provider: appointment scheduled on 01/03/24.  Patient was last seen in primary care on 12/21/2023 by Billy Knee, FNP.  Called Nurse Triage reporting Hospitalization Follow-up and Anxiety.  Symptoms began several days ago.  Interventions attempted: Prescription medications: atenolol .  Symptoms are: gradually worsening.  Triage Disposition: See HCP Within 4 Hours (Or PCP Triage)  Patient/caregiver understands and will follow disposition?: Yes           Copied from CRM #[id number]. Topic: Clinical - Red Word Triage >> Jan 03, 2024  7:56 AM Mesmerise C wrote: [name] that prompted transfer to Nurse Triage: Patient is experiencing shakiness, dry mouth, anxiety, loosing weight was seen in ED on 11/20 for dehydration wants to be seen Dr. Berneta if possible today Reason for Disposition  Condition / symptoms WORSE  Answer Assessment - Initial Assessment Questions 1. MAIN CONCERN OR SYMPTOM:  What is your main concern right now? What question do you have? What's the main symptom you're worried about? (e.g., breathing difficulty, ankle swelling, weight gain.)     Shaking, jumping out of my skin, 2. ONSET: When did the  sx  start?     This AM 3. BETTER-SAME-WORSE: Are you getting better, staying the same, or getting worse compared to the day you were discharged?     Worse this AM 4. HOSPITALIZATION: How long were you hospitalized? (e.g., days)     D/c same day 5. DISCHARGE DIAGNOSIS:  What problem or disease were you hospitalized for?     Anxiety, see chart 6. DISCHARGE DATE: What date were you discharged from the hospital?     D/c same day 7. DISCHARGE DOCTOR: Who is the main doctor taking care of you now?     See chart 8. DISCHARGE APPOINTMENT: Have you scheduled a follow-up discharge appointment with your doctor?     Calling to schedule today 9. DISCHARGE MEDICINES: Did the doctor (or NP/PA) who discharged you  order any new medicines for you to use? If yes, have you filled the prescription and started taking the medicine?      Ativan  --  10. PAIN: Is there any pain? If Yes, ask: How bad is it?  (Scale 0-10; or none, mild, moderate, severe)       N/a 11. FEVER: Do you have a fever? If Yes, ask: What is it, how was it measured  and when did it start?       N/a 12. OTHER SYMPTOMS: Do you have any other symptoms?       Hx of anxiety  Protocols used: Post-Hospitalization Follow-up Call-A-AH

## 2024-01-05 ENCOUNTER — Telehealth: Payer: Self-pay | Admitting: Cardiovascular Disease

## 2024-01-05 ENCOUNTER — Ambulatory Visit: Payer: Self-pay

## 2024-01-05 NOTE — Telephone Encounter (Signed)
 I would actually recommend taking the atenolol  daily but in the evenings, that way the medicine has its peak effect in the morning, when she seems to be having most of her trouble.  I also think it is worthwhile checking to make sure that she is in normal rhythm (sinus tachycardia) and not in a true arrhythmia.  Can we set her up for an ECG nurse visit?

## 2024-01-05 NOTE — Telephone Encounter (Signed)
 STAT if HR is under 50 or over 120 (normal HR is 60-100 beats per minute)  What is your heart rate?   HR 117  BP 120/81  Do you have a log of your heart rate readings (document readings)?   Yes  Do you have any other symptoms?   Headache  Patient stated she has been anxious lately and has been having rapid HR reading and has been taking 1/2 dosage atenolol  (TENORMIN ) 25 MG tablet.  Patient wants a call back to discuss next steps.

## 2024-01-05 NOTE — Telephone Encounter (Signed)
 Pt calling this morning with questions in regards to her HR and anxiety. Pt extremely anxious and states she has been like this for a few months. Saw PCP Monday and being referred to a psychiatrist. Pt has Lorazepam , but states she doesn't take it because it makes her drowsy. Also asking if it is safe to take Atenolol  with Lorazepam . Pt takes half her Ambien at night to sleep. Pt has been taking her PRN dose of Atenolol  every morning after she gets up and eats breakfast, but states her HR ranges from 115-125 BPM every morning as soon as she wakes up. Pt wanting advice from Dr. Francyne if she should start taking this medication every morning as soon as she wakes up or any other recommendations. Will send this message to Dr. Francyne for further advisements. Pt verbalizes understanding.

## 2024-01-05 NOTE — Telephone Encounter (Signed)
 I would actually recommend taking the atenolol  daily but in the evenings, that way the medicine has its peak effect in the morning, when she seems to be having most of her trouble.  I also think it is worthwhile checking to make sure that she is in normal rhythm (sinus tachycardia) and not in a true arrhythmia.  Can we set her up for an ECG nurse visit?     Gave her the information above. She reports that she just got an EKG and it was fine- when I mentioned getting an EKG- nurse visit. She also asked me several questions about taking the ambien and the atenolol  together. I told her she could space it out by about 30 minutes it she wants to.  She also asked me, what if I wake up and my heart rate is up in the morning, do I take another half a tablet and then take a half at night again too. Told her that she could always take a quarter of a tablet in the morning if her hr is increased and take the other quarter at bedtime.  She verbalized understanding.

## 2024-01-05 NOTE — Telephone Encounter (Signed)
  FYI Only or Action Required?: Action required by provider: referral request.  Patient was last seen in primary care on 01/03/2024 by Tracey Elsie Sayre, MD.  Called Nurse Triage reporting Anxiety.  Symptoms began today.  Interventions attempted: Nothing.  Symptoms are: unchanged.Pt. Reports she has not heard from psychiatry referral. Asking PCP to hurry the process. Will try her Lorazepam . Please advise pt.  Triage Disposition: See PCP When Office is Open (Within 3 Days)  Patient/caregiver understands and will follow disposition?: No, wishes to speak with PCP     Copied from CRM #[id number]. Topic: Clinical - Red Word Triage >> Jan 05, 2024 10:11 AM [name] wrote: [name] that prompted transfer to Nurse Triage: Patient called said that she was in on Monday for Rivera referral for Rivera Psychiatrist and wanted to know was it sent agent informed patient it could take 7-10 business days. Patient said she does not have time for that and is having some problems Reason for Disposition  MODERATE anxiety (e.g., persistent or frequent anxiety symptoms; interferes with sleep, school, or work)  Answer Assessment - Initial Assessment Questions 1. CONCERN: Did anything happen that prompted you to call today?      anxiety 2. ANXIETY SYMPTOMS: Can you describe how you (your loved one; patient) have been feeling? (e.g., tense, restless, panicky, anxious, keyed up, overwhelmed, sense of impending doom).      anxiety 3. ONSET: How long have you been feeling this way? (e.g., hours, days, weeks)     weeks 4. SEVERITY: How would you rate the level of anxiety? (e.g., 0 - 10; or mild, moderate, severe).     severe 5. FUNCTIONAL IMPAIRMENT: How have these feelings affected your ability to do daily activities? Have you had more difficulty than usual doing your normal daily activities? (e.g., getting better, same, worse; self-care, school, work, interactions)     yes 6. HISTORY: Have you felt  this way before? Have you ever been diagnosed with an anxiety problem in the past? (e.g., generalized anxiety disorder, panic attacks, PTSD). If Yes, ask: How was this problem treated? (e.g., medicines, counseling, etc.)     yes 7. RISK OF HARM - SUICIDAL IDEATION: Do you ever have thoughts of hurting or killing yourself? If Yes, ask:  Do you have these feelings now? Do you have Rivera plan on how you would do this?     no 8. TREATMENT:  What has been done so far to treat this anxiety? (e.g., medicines, relaxation strategies). What has helped?     Will take the lorazepam   9. THERAPIST: Do you have Rivera counselor or therapist? If Yes, ask: What is their name?     yes 10. POTENTIAL TRIGGERS: Do you drink caffeinated beverages (e.g., coffee, colas, teas), and how much daily? Do you drink alcohol or use any drugs? Have you started any new medicines recently?       no 11. PATIENT SUPPORT: Who is with you now? Who do you live with? Do you have family or friends who you can talk to?        family 69. OTHER SYMPTOMS: Do you have any other symptoms? (e.g., feeling depressed, trouble concentrating, trouble sleeping, trouble breathing, palpitations or fast heartbeat, chest pain, sweating, nausea, or diarrhea)       palpitations 13. PREGNANCY: Is there any chance you are pregnant? When was your last menstrual period?       no  Protocols used: Anxiety and Panic Attack-Rivera-AH

## 2024-01-08 ENCOUNTER — Telehealth: Payer: Self-pay | Admitting: Student

## 2024-01-08 ENCOUNTER — Other Ambulatory Visit: Payer: Self-pay | Admitting: Nurse Practitioner

## 2024-01-08 NOTE — Telephone Encounter (Signed)
   Patient called the after-hours Answering Service with concerns about difficulty sleeping.  Called and spoke with patient.  She has been dealing with a lot of anxiety recently and also has difficulty sleeping.  PCP recently referred her to psychiatry.  She is on Ambien to help her sleep but she thinks this is too strong for her.  She was calling because she wanted to know if there was anything else we could prescribe to help her sleep other than Ambien.  I recommended she reach out to her PCP for this.  She also mentioned that she is taking 1/4 of her Atenolol  tablet (12.5mg ) in the morning and in the evening to help with her palpitations.  Please see phone note from 01/05/2024 for more information.  She was previously instructed to take 1/2 tablet in the evening but has been taking 1/4 tablet twice daily.  I explained that it is hard to say how much she is actually getting if she is cutting the tablets in fourths but she states this is working for her so okay to continue for now   Tracey Muriel E Marigrace Mccole, PA-C 01/08/2024 10:39 AM

## 2024-01-10 ENCOUNTER — Encounter: Payer: Self-pay | Admitting: Physical Therapy

## 2024-01-10 ENCOUNTER — Ambulatory Visit: Admitting: Physical Therapy

## 2024-01-10 ENCOUNTER — Other Ambulatory Visit: Payer: Self-pay

## 2024-01-10 DIAGNOSIS — M25611 Stiffness of right shoulder, not elsewhere classified: Secondary | ICD-10-CM | POA: Diagnosis present

## 2024-01-10 DIAGNOSIS — F411 Generalized anxiety disorder: Secondary | ICD-10-CM | POA: Diagnosis not present

## 2024-01-10 DIAGNOSIS — M6281 Muscle weakness (generalized): Secondary | ICD-10-CM | POA: Insufficient documentation

## 2024-01-10 DIAGNOSIS — M25511 Pain in right shoulder: Secondary | ICD-10-CM | POA: Insufficient documentation

## 2024-01-10 DIAGNOSIS — F422 Mixed obsessional thoughts and acts: Secondary | ICD-10-CM | POA: Diagnosis not present

## 2024-01-10 NOTE — Telephone Encounter (Signed)
 Requesting: busPIRone  HCL 5 MG TABLET  Last Visit: 12/13/2023 Next Visit: Visit date not found Last Refill: The original prescription was discontinued on 12/20/2023 by [name], MD. Renewing this prescription may not be appropriate.   Please Advise

## 2024-01-10 NOTE — Therapy (Signed)
 OUTPATIENT PHYSICAL THERAPY SHOULDER EVALUATION   Patient Name: [name] MRN: [id number] DOB:Jun 30, 1950, 73 y.o., female Today's Date: 01/10/2024  END OF SESSION:  PT End of Session - 01/10/24 0931     Visit Number 1    Number of Visits 13    Date for Recertification  04/09/24    Authorization Type [employer]    PT Start Time 0930    PT Stop Time 1015    PT Time Calculation (min) 45 min    Activity Tolerance Patient tolerated treatment well    Behavior During Therapy Anxious          Past Medical History:  Diagnosis Date   Anal fissure    Anemia    during pregnancy   Anxiety    Basal cell carcinoma    DDD (degenerative disc disease)    Diverticulosis    Hypokalemia    LBP (low back pain)    Melanoma (HCC)    Migraine    MVP (mitral valve prolapse) 1990   psa   Rosacea    Ulcer    as a child   Past Surgical History:  Procedure Laterality Date   ABDOMINAL HYSTERECTOMY  1985   BREAST BIOPSY Left    secondary to infection   I & D EXTREMITY Right 09/09/2012   Procedure: Minor IRRIGATION AND DEBRIDEMENT EXTREMITY paronychia of right thumb;  Surgeon: Arley JONELLE Curia, MD;  Location: [facility];  Service: Orthopedics;  Laterality: Right;   I & D EXTREMITY Left 01/26/2014   Procedure: MINOR IINCISION AND DRAINAGE paronychia left index finger;  Surgeon: Arley Curia, MD;  Location: [facility];  Service: Orthopedics;  Laterality: Left;  left index   OOPHORECTOMY Left 1994   REPAIR KNEE LIGAMENT Left 1989   TONSILLECTOMY  1955   TUBAL LIGATION     VEIN SURGERY     Patient Active Problem List   Diagnosis Date Noted   History of fall 12/02/2023   Post-viral cough syndrome 03/18/2023   Upper respiratory tract infection 02/26/2023   Nausea 02/26/2023   Pain of left hip 11/13/2022   Cervical strain 11/13/2022   Sciatica of right side 09/30/2022   B12 deficiency 09/30/2022   Anxiety 09/30/2022   Agatston coronary artery calcium  score between  200 and 399 08/03/2022   Osteopenia of necks of both femurs 06/23/2022   Dupuytren's contracture of both hands 06/23/2022   Low TSH level 05/20/2022   Prediabetes 05/20/2022   Cellulitis of finger of right hand 09/08/2021   Infection of skin 05/13/2021   Cervicalgia 05/06/2021   Grieving 05/06/2021   Conductive hearing loss of right ear 03/04/2021   Impacted cerumen of right ear 03/04/2021   Medication side effect 03/03/2021   Excessive cerumen in right ear canal 03/03/2021   Labyrinthitis 07/30/2020   Palpitations 04/15/2020   Plantar fasciitis 04/15/2020   Dysthymia 04/15/2020   Vitamin D  deficiency 04/15/2020   Gastroesophageal reflux disease 04/15/2020   Insect bite of left foot 11/24/2019   Pustule 11/24/2019   Pain of right thumb 03/09/2019   External hemorrhoids 03/03/2018   Other chest pain 02/23/2018   Insomnia 02/24/2017   Healthcare maintenance 02/24/2017   Conjunctivitis 03/01/2014   Seborrheic keratosis, inflamed 01/05/2012   Menopausal syndrome 12/22/2011   Encounter for counseling 11/11/2011   Skin cancer 07/14/2011   Basal cell carcinoma of face 03/10/2011   Migraine 05/14/2007   IRRITABLE BOWEL SYNDROME 05/14/2007   History of mitral valve prolapse  05/14/2007   Allergic rhinitis 04/07/2007   DERMATITIS DUE TO SOLAR RADIATION 04/07/2007   Premature beat 09/20/2006   DIVERTICULOSIS, COLON 04/13/2002    PCP: Elsie Lent  REFERRING PROVIDER: Debby Fireman  REFERRING DIAG: D57.798J- unspecified fx of upper end of R humerus   THERAPY DIAG:  Acute pain of right shoulder  Stiffness of right shoulder, not elsewhere classified  Muscle weakness (generalized)  Rationale for Evaluation and Treatment: Rehabilitation  ONSET DATE: 11/26/23  SUBJECTIVE:                                                                                                                                                                                      SUBJECTIVE  STATEMENT: Patient reports that she feels that she is miserable with anxiety, not sleeping, feels like she cannot live like this.  She had a fall in October and sustained a proximal humerus fracture.  She is feeling depressed  Hand dominance: Right  PERTINENT HISTORY: 12/28/23--Dr. Kay reviewed/interpreted ap/outlet views of right shoulder showing proximal greater tuberosity fracture in good position and healing recommend gentle physical therapy to return rom and strength f/u in 4 weeks for recheck and repeat x-rays pt and family in agreement denies need for any pain medication   11/26/23- patient suffered a fall and hit the right side of her head and shoulder. Resulted in a non displaced fracture of the greater tuberosity of R humerus.   PAIN:  Are you having pain? Yes: NPRS scale: 0/10 Pain location: right shoulder Pain description: ache, tight, sore Aggravating factors: movements pain up to 6-7/10 Relieving factors: rest, pain can be 0/10  PRECAUTIONS: Other: no abduction  RED FLAGS: None   WEIGHT BEARING RESTRICTIONS: No  FALLS:  Has patient fallen in last 6 months? Yes. Number of falls 1  LIVING ENVIRONMENT: Lives with: lives with their family Lives in: House/apartment Stairs: No Has following equipment at home: None  OCCUPATION: retired  PLOF: Independent  PATIENT GOALS:feel better overall, be able to move the arm, do my hair  NEXT MD VISIT: 01/25/24  OBJECTIVE:  Note: Objective measures were completed at Evaluation unless otherwise noted.  DIAGNOSTIC FINDINGS:  Small humeral fracture, non displaced fracture of the greater tuberosity  PATIENT SURVEYS:  Quick dash 70.5%  COGNITION: Overall cognitive status: anxious     SENSATION: WFL  POSTURE: Fwd head, rounded shoulders  UPPER EXTREMITY ROM:   Passive ROM Right PROM eval Left eval  Shoulder flexion 130   Shoulder extension    Shoulder abduction    Shoulder adduction    Shoulder internal  rotation 30   Shoulder external rotation 45   Elbow flexion  Elbow extension    Wrist flexion    Wrist extension    Wrist ulnar deviation    Wrist radial deviation    Wrist pronation    Wrist supination    (Blank rows = not tested)  UPPER EXTREMITY MMT:  not tested due to the fracture  MMT Right eval Left eval  Shoulder flexion    Shoulder extension    Shoulder abduction    Shoulder adduction    Shoulder internal rotation    Shoulder external rotation    Middle trapezius    Lower trapezius    Elbow flexion    Elbow extension    Wrist flexion    Wrist extension    Wrist ulnar deviation    Wrist radial deviation    Wrist pronation    Wrist supination    Grip strength (lbs)    (Blank rows = not tested)  SHOULDER SPECIAL TESTS: Not tested due to fracture  PALPATION:  Very tight and guarded to the upper trap and deltoids                                                                                                                             TREATMENT DATE:  01/10/24 Evaluation   PATIENT EDUCATION: Education details: HEP/POC Person educated: Patient Education method: Programmer, Multimedia, Facilities Manager, Actor cues, Verbal cues, and Handouts Education comprehension: verbalized understanding  HOME EXERCISE PROGRAM: Access Code: [email or web address] URL: [email or web address] Date: 01/10/2024 Prepared by: Ozell Mainland  Exercises - Supine Shoulder Flexion AAROM with Hands Clasped  - 3 x daily - 7 x weekly - 1 sets - 10 reps - 3 hold - Seated Scapular Retraction  - 3 x daily - 7 x weekly - 1 sets - 10 reps - 3 hold - Seated Shoulder Flexion Towel Slide at Table Top  - 3 x daily - 7 x weekly - 1 sets - 10 reps - 3 hold - Circular Shoulder Pendulum with Table Support  - 3 x daily - 7 x weekly - 1 sets - 10 reps - 3 hold - Seated Elbow Extension and Shoulder External Rotation AAROM at Table with Towel  - 3 x daily - 7 x weekly - 1 sets - 10 reps - 3 hold - Seated  Shoulder Flexion Self PROM  - 3 x daily - 7 x weekly - 1 sets - 10 reps - 3 hold - Seated Shoulder Shrug Circles AROM Forward  - 3 x daily - 7 x weekly - 1 sets - 10 reps - 3 hold  ASSESSMENT:  CLINICAL IMPRESSION: Patient is a 73 y.o. female who was seen today for physical therapy evaluation and treatment for fracture of the right humeral greater tuberosity. She was in a sling for about 4 weeks, no out with the limitation of gentle ROM, isometrics and no abduction per MD order.  She is very anxious and reports not sleeping and cannot sit still.  She reports that she was  put on different medications and they made her worse.  I did mention to talk with MD about this or seek talk therapy regarding the high anxiety, she reports that she feels like her body is quitting on her and she cannot function.   Shoulder ROM AAROM is doing well, I did not do abduction, after movements she did report right shoulder and upper arm pain.  She will see the MD in 2 weeks. As of right now her anxiety id the biggest limiting factor, we discussed numerous times regarding the MD order.  OBJECTIVE IMPAIRMENTS: cardiopulmonary status limiting activity, decreased activity tolerance, decreased coordination, decreased endurance, decreased ROM, decreased strength, decreased safety awareness, increased muscle spasms, impaired flexibility, impaired UE functional use, improper body mechanics, postural dysfunction, and pain.   REHAB POTENTIAL: Good  CLINICAL DECISION MAKING: Evolving/moderate complexity  EVALUATION COMPLEXITY: Low   GOALS: Goals reviewed with patient? Yes  SHORT TERM GOALS: Target date: 01/25/24  Independent with initial HEP Baseline: Goal status: INITIAL  LONG TERM GOALS: Target date: 03/21/24  Independent with advanced HEP Baseline:  Goal status: INITIAL  2.  Be able to do hair on her own Baseline: husband or hair dresser is doing it Goal status: INITIAL  3.  Decrease pain overall 50% Baseline:  pain up to 7/10 with motions Goal status: INITIAL  4.  Improve AROM of the right shoulder to 150 degrees flexion Baseline: 130 PROM Goal status: INITIAL  5.  Reach over head with 5# Baseline: unable  Goal status: INITIAL  PLAN:  PT FREQUENCY: 1-2x/week  PT DURATION: 12 weeks  PLANNED INTERVENTIONS: 97164- PT Re-evaluation, 97110-Therapeutic exercises, 97530- Therapeutic activity, 97112- Neuromuscular re-education, 97535- Self Care, 02859- Manual therapy, G0283- Electrical stimulation (unattended), Patient/Family education, Taping, Joint mobilization, Cryotherapy, and Moist heat  PLAN FOR NEXT SESSION: calm her anxiety if possible, follow MD order for gentle ROM, isometrics and avoid abduction due to the tuberosity fracture   OBADIAH OZELL ORN, PT 01/10/2024, 9:32 AM

## 2024-01-12 ENCOUNTER — Encounter: Payer: Self-pay | Admitting: Cardiovascular Disease

## 2024-01-13 ENCOUNTER — Encounter: Payer: Self-pay | Admitting: Family Medicine

## 2024-01-13 ENCOUNTER — Ambulatory Visit: Attending: Cardiology | Admitting: *Deleted

## 2024-01-13 ENCOUNTER — Ambulatory Visit: Admitting: Family Medicine

## 2024-01-13 VITALS — BP 126/64 | HR 81 | Ht 61.0 in | Wt 105.6 lb

## 2024-01-13 VITALS — BP 132/70 | HR 69 | Temp 97.8°F | Ht 61.0 in | Wt 106.8 lb

## 2024-01-13 DIAGNOSIS — I491 Atrial premature depolarization: Secondary | ICD-10-CM | POA: Diagnosis not present

## 2024-01-13 DIAGNOSIS — F418 Other specified anxiety disorders: Secondary | ICD-10-CM | POA: Diagnosis not present

## 2024-01-13 DIAGNOSIS — R002 Palpitations: Secondary | ICD-10-CM | POA: Diagnosis not present

## 2024-01-13 MED ORDER — MIRTAZAPINE 7.5 MG PO TABS: ORAL_TABLET | ORAL | 0 refills | Status: DC

## 2024-01-13 NOTE — Progress Notes (Signed)
 Established Patient Office Visit   Subjective:  Patient ID: [name], female    DOB: 12-23-1950  Age: 73 y.o. MRN: [id number]  Chief Complaint  Patient presents with  . Follow-up    1 month question about medication     HPI Encounter Diagnoses  Name Primary?  SABRA Anxiety with depression Yes   For follow-up of above.  Was recently started on trazodone  by another provider.  It is cause some daytime drowsiness.  Decided not to take the lorazepam .  She is currently taking no other psychoactive medications.  I had introduced mirtazapine  in the past and she had thought that it had led to a rapid heart rate.  She is accompanied by her husband [name].  Initial consultation with psychiatry is on February 4.   Review of Systems  Constitutional: Negative.   HENT: Negative.    Eyes:  Negative for blurred vision, discharge and redness.  Respiratory: Negative.    Cardiovascular: Negative.   Gastrointestinal:  Negative for abdominal pain.  Genitourinary: Negative.   Musculoskeletal: Negative.  Negative for myalgias.  Skin:  Negative for rash.  Neurological:  Negative for tingling, loss of consciousness and weakness.  Endo/Heme/Allergies:  Negative for polydipsia.  Psychiatric/Behavioral:  Positive for depression. The patient is nervous/anxious.      Current Outpatient Medications:  .  atenolol  (TENORMIN ) 25 MG tablet, Take 0.5 tablets (12.5 mg total) by mouth daily as needed., Disp: 30 tablet, Rfl: 3 .  Coenzyme Q10 (CO Q10) 100 MG CAPS, Take by mouth daily., Disp: , Rfl:  .  estradiol (ESTRACE) 0.5 MG tablet, Take 0.5 mg by mouth daily., Disp: , Rfl:  .  hydrocortisone  (ANUSOL -HC) 2.5 % rectal cream, Place 1 Application rectally 2 (two) times daily., Disp: 30 g, Rfl: 0 .  mirtazapine  (REMERON ) 7.5 MG tablet, Take 0.5 tablets (3.75 mg total) by mouth at bedtime for 7 days, THEN 1 tablet (7.5 mg total) at bedtime for 28 days., Disp: 32 tablet, Rfl: 0 .  Multiple Vitamin (MULTIVITAMIN)  tablet, Take 1 tablet by mouth daily., Disp: , Rfl:  .  RESTASIS 0.05 % ophthalmic emulsion, , Disp: , Rfl:  .  rosuvastatin  (CRESTOR ) 10 MG tablet, TAKE 1 TABLET BY MOUTH ONCE DAILY **MUST SCHEDULE AND ATTEND ANNUAL APPOINTMENT FOR FUTURE REFILLS 1ST ATTEMPT, Disp: 90 tablet, Rfl: 3 .  vitamin B-12 (CYANOCOBALAMIN ) 50 MCG tablet, Take 50 mcg by mouth daily., Disp: , Rfl:  .  alfuzosin (UROXATRAL) 10 MG 24 hr tablet, Take 10 mg by mouth daily. (Patient not taking: Reported on 01/13/2024), Disp: , Rfl:  .  busPIRone  (BUSPAR ) 7.5 MG tablet, Take 1 tablet (7.5 mg total) by mouth 2 (two) times daily. (Patient not taking: Reported on 01/13/2024), Disp: 60 tablet, Rfl: 0 .  cephALEXin  (KEFLEX ) 500 MG capsule, Take 500 mg by mouth 2 (two) times daily. (Patient not taking: Reported on 01/13/2024), Disp: , Rfl:  .  clobetasol ointment (TEMOVATE) 0.05 %, Apply 1 Application topically 2 (two) times daily. (Patient not taking: Reported on 01/13/2024), Disp: , Rfl:  .  LORazepam  (ATIVAN ) 1 MG tablet, May take 1/2 to 1 tablet twice daily as needed (Patient not taking: Reported on 01/13/2024), Disp: 30 tablet, Rfl: 1 .  meclizine  (ANTIVERT ) 12.5 MG tablet, TAKE 1 TABLET BY MOUTH EVERY 6 -12 HOURS AS NEEDED FOR DIZZINESS (Patient not taking: Reported on 01/13/2024), Disp: 30 tablet, Rfl: 1 .  metFORMIN  (GLUCOPHAGE -XR) 500 MG 24 hr tablet, TAKE 1 TABLET BY MOUTH AT  BEDTIME (Patient not taking: Reported on 01/13/2024), Disp: 90 tablet, Rfl: 2 .  nystatin cream (MYCOSTATIN), Apply 1 Application topically daily at 6 (six) AM. (Patient not taking: Reported on 01/13/2024), Disp: , Rfl:  .  omeprazole  (PRILOSEC) 20 MG capsule, Take 20 mg by mouth daily. (Patient not taking: Reported on 01/13/2024), Disp: , Rfl:  .  ondansetron  (ZOFRAN ) 4 MG tablet, Take 1 tablet (4 mg total) by mouth every 8 (eight) hours as needed for nausea or vomiting. (Patient not taking: Reported on 01/13/2024), Disp: 20 tablet, Rfl: 0 .  phenazopyridine  (PYRIDIUM) 200 MG tablet, Take 200 mg by mouth 3 (three) times daily. (Patient not taking: Reported on 01/13/2024), Disp: , Rfl:  .  SAMBUCOL BLACK ELDERBERRY PO, Take 1 capsule by mouth 2 (two) times a week. (Patient not taking: Reported on 01/13/2024), Disp: , Rfl:  .  silodosin (RAPAFLO) 4 MG CAPS capsule, Take 4 mg by mouth daily. (Patient not taking: Reported on 01/13/2024), Disp: , Rfl:  .  TRYPTYR 0.003 % SOLN, , Disp: , Rfl:  .  zolmitriptan  (ZOMIG -ZMT) 2.5 MG disintegrating tablet, TAKE 1 TABLET BY MOUTH AS NEEDED FOR MIGRAINE (Patient not taking: Reported on 01/13/2024), Disp: 10 tablet, Rfl: 4 .  zolpidem (AMBIEN) 5 MG tablet, Take 5 mg by mouth at bedtime as needed. (Patient not taking: Reported on 01/13/2024), Disp: , Rfl:    Objective:     BP 132/70   Pulse 69   Temp 97.8 F (36.6 C) (Temporal)   Ht 5' 1 (1.549 m)   Wt 106 lb 12.8 oz (48.4 kg)   SpO2 99%   BMI 20.18 kg/m    Physical Exam Constitutional:      General: She is not in acute distress.    Appearance: Normal appearance. She is not ill-appearing, toxic-appearing or diaphoretic.  HENT:     Head: Normocephalic and atraumatic.     Right Ear: External ear normal.     Left Ear: External ear normal.  Eyes:     General: No scleral icterus.       Right eye: No discharge.        Left eye: No discharge.     Extraocular Movements: Extraocular movements intact.     Conjunctiva/sclera: Conjunctivae normal.  Pulmonary:     Effort: Pulmonary effort is normal. No respiratory distress.  Skin:    General: Skin is warm and dry.  Neurological:     Mental Status: She is alert and oriented to person, place, and time.  Psychiatric:        Mood and Affect: Mood normal.        Behavior: Behavior normal.      No results found for any visits on 01/13/24.    The 10-year ASCVD risk score (Arnett DK, et al., 2019) is: 21.9%* (Cholesterol units were assumed)    Assessment & Plan:   Anxiety with depression -      Mirtazapine ; Take 0.5 tablets (3.75 mg total) by mouth at bedtime for 7 days, THEN 1 tablet (7.5 mg total) at bedtime for 28 days.  Dispense: 32 tablet; Refill: 0    Return in about 29 days (around 02/11/2024).  Will start mirtazapine  at 3.75 nightly and after a week increase to 7.5.  Warned about initial daytime drowsiness for 2 to 3 days that will resolve.  Explained that palpitations were likely secondary to her anxiety more so than the drug.  Elsie Sim Lent, MD

## 2024-01-13 NOTE — Progress Notes (Signed)
 She has a very normal QT interval on ECG.  Remeron  should be okay.

## 2024-01-13 NOTE — Progress Notes (Signed)
   Nurse Visit   Date of Encounter: 01/13/2024 ID: [name], DOB 1950/11/10, MRN [id number]  PCP:  [name], MD   [facility] Providers Cardiologist:  Jerel Balding, MD      Visit Details   VS:  BP 126/64 (BP Location: Left Arm, Patient Position: Sitting, Cuff Size: Normal)   Pulse 81   Ht 5' 1 (1.549 m)   Wt 105 lb 9.6 oz (47.9 kg)   SpO2 98%   BMI 19.95 kg/m  , BMI Body mass index is 19.95 kg/m.  Wt Readings from Last 3 Encounters:  01/13/24 105 lb 9.6 oz (47.9 kg)  01/13/24 106 lb 12.8 oz (48.4 kg)  01/03/24 107 lb (48.5 kg)     Reason for visit: EKG to r/o Arrhythmia per Dr. Balding Performed today: Vitals, EKG, Provider consulted:Dr. Elmira (DOD 1  only for EKG interpretation), and Education; EKG WNL per DOD 1. Changes (medications, testing, etc.) : No other advice per DOD, will route to primary Cardiologist (Croitoru) Length of Visit: 20 minutes  5. Patient would like Dr. Balding (and/or Pharmacy Team) to advise regarding starting Remeron . The Ambien and Trazadone was d/c and now, PCP is recommending Remeron . Also, for right now pt is taking 1/4 tablet of a 25 mg Atenolol  tablet once daily in the mornings. This is working well for her, at the moment. She has been on Atenolol  for years. She will try it in the evenings, per Dr. Tyrone suggestion, once she potentially starts the Remeron .    Medications Adjustments/Labs and Tests Ordered: Orders Placed This Encounter  Procedures   EKG 12-Lead   No orders of the defined types were placed in this encounter.    Signed, Zara Buel Fret, RN  01/13/2024 3:50 PM

## 2024-01-13 NOTE — Telephone Encounter (Signed)
 Spoke with patient and set-up nurse visit for EKG per Dr. Francyne. Pt states she cannot use the elevator and usually parks on 3rd floor and goes in employee entrance on 2nd floor and then takes the stairs to the 5th floor. Spoke with 2nd floor patient check-in and stated they would assist her in the door and up the stairs for visit. Called patient back with phone number to call once she arrives. Pt verbalizes understanding.

## 2024-01-14 ENCOUNTER — Telehealth: Payer: Self-pay

## 2024-01-14 NOTE — Telephone Encounter (Unsigned)
 Copied from CRM [id number]. Topic: Clinical - Medical Advice >> Jan 14, 2024 10:14 AM China J wrote: Reason for CRM: Patient wanted to ask Dr. Berneta about her weight loss and why that is happening.  She wanted to let him know that the medication helped her sleep last night.  Please call the patient at [phone] as she is aware of the same day turn around time.

## 2024-01-14 NOTE — Telephone Encounter (Signed)
 Called and spoke to pt regarding Dr. Tyrone advice on Remeron  medication:  She has a very normal QT interval on ECG.  Remeron  should be okay.    GLENWOOD Balding, Mihai, MD (Physician)     Pt states she did end up starting this medication on 01/13/24 and she slept all night. She was very appreciative of the phone call/ follow up advice from Dr. Balding. She is very concerned about her WEIGHT LOSS and she is consuming all kinds of foods and nutrition shakes to help with gaining weight. She is constantly losing weight, and this all started around September 2025 but has noticed that it has worsened the last 4-5 weeks. Advised her to f/u with her PCP regarding this. She states she has reached out to them but nothing has been done, per pt.

## 2024-01-15 ENCOUNTER — Other Ambulatory Visit: Payer: Self-pay | Admitting: Family Medicine

## 2024-01-15 DIAGNOSIS — F419 Anxiety disorder, unspecified: Secondary | ICD-10-CM

## 2024-01-17 NOTE — Telephone Encounter (Signed)
 I am afraid that is a much more complicated topic and I do not have a straight answer.  Would defer to her PCP.  I did look over the labs and can say that it is not because of hyperthyroidism, which is a common reason for weight loss despite good intake of food.  She had normal thyroid  test recently.

## 2024-01-18 ENCOUNTER — Ambulatory Visit: Admitting: Physical Therapy

## 2024-01-18 ENCOUNTER — Ambulatory Visit: Payer: Self-pay

## 2024-01-18 ENCOUNTER — Encounter: Payer: Self-pay | Admitting: Physical Therapy

## 2024-01-18 DIAGNOSIS — M25511 Pain in right shoulder: Secondary | ICD-10-CM

## 2024-01-18 DIAGNOSIS — M25611 Stiffness of right shoulder, not elsewhere classified: Secondary | ICD-10-CM

## 2024-01-18 DIAGNOSIS — M6281 Muscle weakness (generalized): Secondary | ICD-10-CM

## 2024-01-18 MED ORDER — BUSPIRONE HCL 7.5 MG PO TABS: 7.5000 mg | ORAL_TABLET | Freq: Two times a day (BID) | ORAL | 0 refills | Status: DC

## 2024-01-18 NOTE — Telephone Encounter (Signed)
 Refill request received for Buspirone  7.5mg  QNC:Wnwz LOV: 01/13/2024 Last refill: 12/20/2023 Medication is denied. Pt reports at last appt that she is not taking this medication

## 2024-01-18 NOTE — Telephone Encounter (Signed)
 Spoke with patient says half of sleep meds she is taking is good amount. Do not need increase right now. Doing well on it, no concerns.

## 2024-01-18 NOTE — Addendum Note (Signed)
 Addended by: BERNETA ELSIE LABOR on: 01/18/2024 09:36 AM   Modules accepted: Orders

## 2024-01-18 NOTE — Therapy (Signed)
 OUTPATIENT PHYSICAL THERAPY SHOULDER EVALUATION   Patient Name: [name] MRN: [id number] DOB:08-28-1950, 73 y.o., female Today's Date: 01/18/2024  END OF SESSION:  PT End of Session - 01/18/24 1019     Visit Number 2    Number of Visits 13    PT Start Time 1017    PT Stop Time 1100    PT Time Calculation (min) 43 min    Activity Tolerance Patient tolerated treatment well    Behavior During Therapy Anxious          Past Medical History:  Diagnosis Date   Anal fissure    Anemia    during pregnancy   Anxiety    Basal cell carcinoma    DDD (degenerative disc disease)    Diverticulosis    Hypokalemia    LBP (low back pain)    Melanoma (HCC)    Migraine    MVP (mitral valve prolapse) 1990   psa   Rosacea    Ulcer    as a child   Past Surgical History:  Procedure Laterality Date   ABDOMINAL HYSTERECTOMY  1985   BREAST BIOPSY Left    secondary to infection   I & D EXTREMITY Right 09/09/2012   Procedure: Minor IRRIGATION AND DEBRIDEMENT EXTREMITY paronychia of right thumb;  Surgeon: Arley JONELLE Curia, MD;  Location: [facility];  Service: Orthopedics;  Laterality: Right;   I & D EXTREMITY Left 01/26/2014   Procedure: MINOR IINCISION AND DRAINAGE paronychia left index finger;  Surgeon: Arley Curia, MD;  Location: [facility];  Service: Orthopedics;  Laterality: Left;  left index   OOPHORECTOMY Left 1994   REPAIR KNEE LIGAMENT Left 1989   TONSILLECTOMY  1955   TUBAL LIGATION     VEIN SURGERY     Patient Active Problem List   Diagnosis Date Noted   History of fall 12/02/2023   Post-viral cough syndrome 03/18/2023   Upper respiratory tract infection 02/26/2023   Nausea 02/26/2023   Pain of left hip 11/13/2022   Cervical strain 11/13/2022   Sciatica of right side 09/30/2022   B12 deficiency 09/30/2022   Anxiety 09/30/2022   Agatston coronary artery calcium  score between 200 and 399 08/03/2022   Osteopenia of necks of both femurs  06/23/2022   Dupuytren's contracture of both hands 06/23/2022   Low TSH level 05/20/2022   Prediabetes 05/20/2022   Cellulitis of finger of right hand 09/08/2021   Infection of skin 05/13/2021   Cervicalgia 05/06/2021   Grieving 05/06/2021   Conductive hearing loss of right ear 03/04/2021   Impacted cerumen of right ear 03/04/2021   Medication side effect 03/03/2021   Excessive cerumen in right ear canal 03/03/2021   Labyrinthitis 07/30/2020   Palpitations 04/15/2020   Plantar fasciitis 04/15/2020   Dysthymia 04/15/2020   Vitamin D  deficiency 04/15/2020   Gastroesophageal reflux disease 04/15/2020   Insect bite of left foot 11/24/2019   Pustule 11/24/2019   Pain of right thumb 03/09/2019   External hemorrhoids 03/03/2018   Other chest pain 02/23/2018   Insomnia 02/24/2017   Healthcare maintenance 02/24/2017   Conjunctivitis 03/01/2014   Seborrheic keratosis, inflamed 01/05/2012   Menopausal syndrome 12/22/2011   Encounter for counseling 11/11/2011   Skin cancer 07/14/2011   Basal cell carcinoma of face 03/10/2011   Migraine 05/14/2007   IRRITABLE BOWEL SYNDROME 05/14/2007   History of mitral valve prolapse 05/14/2007   Allergic rhinitis 04/07/2007   DERMATITIS DUE TO SOLAR RADIATION 04/07/2007  Premature beat 09/20/2006   DIVERTICULOSIS, COLON 04/13/2002    PCP: Elsie Lent  REFERRING PROVIDER: Debby Fireman  REFERRING DIAG: D57.798J- unspecified fx of upper end of R humerus   THERAPY DIAG:  Acute pain of right shoulder  Stiffness of right shoulder, not elsewhere classified  Muscle weakness (generalized)  Rationale for Evaluation and Treatment: Rehabilitation  ONSET DATE: 11/26/23  SUBJECTIVE:                                                                                                                                                                                      SUBJECTIVE STATEMENT: I want to get back to normal Im losing weight Im sluggish  I have some shakes Hand dominance: Right  PERTINENT HISTORY: 12/28/23--Dr. Kay reviewed/interpreted ap/outlet views of right shoulder showing proximal greater tuberosity fracture in good position and healing recommend gentle physical therapy to return rom and strength f/u in 4 weeks for recheck and repeat x-rays pt and family in agreement denies need for any pain medication   11/26/23- patient suffered a fall and hit the right side of her head and shoulder. Resulted in a non displaced fracture of the greater tuberosity of R humerus.   PAIN:  Are you having pain? Yes: NPRS scale: 0/10 Pain location: right shoulder Pain description: ache, tight, sore Aggravating factors: movements pain up to 6-7/10 Relieving factors: rest, pain can be 0/10  PRECAUTIONS: Other: no abduction  RED FLAGS: None   WEIGHT BEARING RESTRICTIONS: No  FALLS:  Has patient fallen in last 6 months? Yes. Number of falls 1  LIVING ENVIRONMENT: Lives with: lives with their family Lives in: House/apartment Stairs: No Has following equipment at home: None  OCCUPATION: retired  PLOF: Independent  PATIENT GOALS:feel better overall, be able to move the arm, do my hair  NEXT MD VISIT: 01/25/24  OBJECTIVE:  Note: Objective measures were completed at Evaluation unless otherwise noted.  DIAGNOSTIC FINDINGS:  Small humeral fracture, non displaced fracture of the greater tuberosity  PATIENT SURVEYS:  Quick dash 70.5%  COGNITION: Overall cognitive status: anxious     SENSATION: WFL  POSTURE: Fwd head, rounded shoulders  UPPER EXTREMITY ROM:   Passive ROM Right PROM eval Left eval  Shoulder flexion 130   Shoulder extension    Shoulder abduction    Shoulder adduction    Shoulder internal rotation 30   Shoulder external rotation 45   Elbow flexion    Elbow extension    Wrist flexion    Wrist extension    Wrist ulnar deviation    Wrist radial deviation    Wrist pronation    Wrist  supination    (  Blank rows = not tested)  UPPER EXTREMITY MMT:  not tested due to the fracture  MMT Right eval Left eval  Shoulder flexion    Shoulder extension    Shoulder abduction    Shoulder adduction    Shoulder internal rotation    Shoulder external rotation    Middle trapezius    Lower trapezius    Elbow flexion    Elbow extension    Wrist flexion    Wrist extension    Wrist ulnar deviation    Wrist radial deviation    Wrist pronation    Wrist supination    Grip strength (lbs)    (Blank rows = not tested)  SHOULDER SPECIAL TESTS: Not tested due to fracture  PALPATION:  Very tight and guarded to the upper trap and deltoids                                                                                                                             TREATMENT DATE:  01/18/24 NuStep L 4 x 4 min, 2 min LE only  AAROM Flex, Ext, IR 1lb WaTE x10  Sit to stands 2x10 Hs curls green 2x10 RUE Isometrics flex, Ext, IR, ER 2x5 w/ 3 sec holds  01/10/24 Evaluation   PATIENT EDUCATION: Education details: HEP/POC Person educated: Patient Education method: Programmer, Multimedia, Facilities Manager, Actor cues, Verbal cues, and Handouts Education comprehension: verbalized understanding  HOME EXERCISE PROGRAM: Access Code: [email or web address] URL: [email or web address] Date: 01/10/2024 Prepared by: Ozell Albright/ Exercises - Supine Shoulder Flexion AAROM with Hands Clasped  - 3 x daily - 7 x weekly - 1 sets - 10 reps - 3 hold - Seated Scapular Retraction  - 3 x daily - 7 x weekly - 1 sets - 10 reps - 3 hold - Seated Shoulder Flexion Towel Slide at Table Top  - 3 x daily - 7 x weekly - 1 sets - 10 reps - 3 hold - Circular Shoulder Pendulum with Table Support  - 3 x daily - 7 x weekly - 1 sets - 10 reps - 3 hold - Seated Elbow Extension and Shoulder External Rotation AAROM at Table with Towel  - 3 x daily - 7 x weekly - 1 sets - 10 reps - 3 hold - Seated Shoulder Flexion Self PROM   - 3 x daily - 7 x weekly - 1 sets - 10 reps - 3 hold - Seated Shoulder Shrug Circles AROM Forward  - 3 x daily - 7 x weekly - 1 sets - 10 reps - 3 hold  ASSESSMENT:  CLINICAL IMPRESSION: Patient is a 73 y.o. female who was seen today for physical therapy treatment for fracture of the right humeral greater tuberosity. She was in a sling for about 4 weeks, no out with the limitation of gentle ROM, isometrics and no abduction per MD order.  She is very anxious and reports not sleeping and cannot sit still.   Again shoulder ROM AAROM  is doing well, avoided abduction. She will see the MD in 2 weeks. As of right now her anxiety id the biggest limiting factor, we discussed numerous times regarding the MD order.  OBJECTIVE IMPAIRMENTS: cardiopulmonary status limiting activity, decreased activity tolerance, decreased coordination, decreased endurance, decreased ROM, decreased strength, decreased safety awareness, increased muscle spasms, impaired flexibility, impaired UE functional use, improper body mechanics, postural dysfunction, and pain.   REHAB POTENTIAL: Good  CLINICAL DECISION MAKING: Evolving/moderate complexity  EVALUATION COMPLEXITY: Low   GOALS: Goals reviewed with patient? Yes  SHORT TERM GOALS: Target date: 01/25/24  Independent with initial HEP Baseline: Goal status: Progressing 01/18/24  LONG TERM GOALS: Target date: 03/21/24  Independent with advanced HEP Baseline:  Goal status: INITIAL  2.  Be able to do hair on her own Baseline: husband or hair dresser is doing it Goal status: INITIAL  3.  Decrease pain overall 50% Baseline: pain up to 7/10 with motions Goal status: INITIAL  4.  Improve AROM of the right shoulder to 150 degrees flexion Baseline: 130 PROM Goal status: INITIAL  5.  Reach over head with 5# Baseline: unable  Goal status: INITIAL  PLAN:  PT FREQUENCY: 1-2x/week  PT DURATION: 12 weeks  PLANNED INTERVENTIONS: 97164- PT Re-evaluation,  97110-Therapeutic exercises, 97530- Therapeutic activity, 97112- Neuromuscular re-education, 97535- Self Care, 02859- Manual therapy, [id number]- Electrical stimulation (unattended), Patient/Family education, Taping, Joint mobilization, Cryotherapy, and Moist heat  PLAN FOR NEXT SESSION: calm her anxiety if possible, follow MD order for gentle ROM, isometrics and avoid abduction due to the tuberosity fracture   Tanda KANDICE Sorrow, PTA 01/18/2024, 10:20 AM

## 2024-01-18 NOTE — Telephone Encounter (Signed)
 FYI Only or Action Required?: Action required by provider: clinical question for provider.  Patient was last seen in primary care on 01/13/2024 by [name], MD.  Called Nurse Triage reporting Fatigue.  Symptoms began several days ago.  Interventions attempted: OTC medications: B12 500 mcg and Rest, hydration, or home remedies.  Symptoms are: unchanged.  Triage Disposition: See PCP When Office is Open (Within 3 Days)  Patient/caregiver understands and will follow disposition?: No, wishes to speak with PCP    Copied from CRM #[id number]. Topic: Clinical - Medication Question >> Jan 18, 2024  1:47 PM [name] wrote: Reason for CRM: Patient called back and is asking is there a medication that she can take to help with her energy boosting in the day? Or, if Dr.Kremer suggest any type of nutritional drinks to help with energy. Something that doesn't have caffeine . Something that could work now, versus two weeks from now.   [phone] (H) Reason for Disposition  [1] Fatigue (i.e., tires easily, decreased energy) AND [2] persists > 1 week  Answer Assessment - Initial Assessment Questions Patient called to ask Dr. Tresa recommendations for increasing her energy during the day while adjusting to Remeron . She reports she started Remeron  7.5mg   one week ago but only taking half tablet, she has noticed that she has low energy during the afternoon, sometimes will need to nap and she does not want to interfere with her night time sleep. She reports she takes otc B12 500 mcg and plans to increase to 1,038mcg. She would like to know if Dr. Berneta thinks 1,000 mcg of B12 per day is ok for her to take and also if he has other non caffeine  recommendations. Requesting call back with pcp recommendation for low energy. Declined need for follow up appointment.   1. DESCRIPTION: Describe how you are feeling.     Low energy-feeling she needs a nap in the afternoon.  2. SEVERITY: How bad is it?   Can you stand and walk?     Able to go about her day.  3. ONSET: When did these symptoms begin? (e.g., hours, days, weeks, months)     Ongoing  4. CAUSE: What do you think is causing the weakness or fatigue? (e.g., not drinking enough fluids, medical problem, trouble sleeping)     New Remeron  5. NEW MEDICINES:  Have you started on any new medicines recently? (e.g., opioid pain medicines, benzodiazepines, muscle relaxants, antidepressants, antihistamines, neuroleptics, beta blockers)     Remeron  taking half tablet  6. OTHER SYMPTOMS: Do you have any other symptoms? (e.g., chest pain, fever, cough, SOB, vomiting, diarrhea, bleeding, other areas of pain)     She has been improving her weight.  Protocols used: Weakness (Generalized) and Fatigue-A-AH

## 2024-01-20 ENCOUNTER — Telehealth: Payer: Self-pay | Admitting: Cardiovascular Disease

## 2024-01-20 NOTE — Telephone Encounter (Signed)
 Pt is aware of recommendations to f/u with PCP or psych specialist.

## 2024-01-20 NOTE — Telephone Encounter (Signed)
 You are correct.  I do not think I should be advising on this topic.  She should discuss it with her primary care provider or psych specialist.

## 2024-01-20 NOTE — Telephone Encounter (Signed)
 Advised pt this is a class of medication this office typically does not order and she should follow up with MD who did order.  She reports she just wants to come off this medication and get her life back.  Reports having insomnia for some time and this medication makes her feel drowsy the next day.  Reports she is only taking 1/2 of the dose prescribed.  States she understands this office doesn't typically order these medications but she trusts Dr Tyrone opinion and wants to be able to make a suggestion to be prescribing MD.  Myles I will forward this to him for review but not sure he will have any recommendations.  She is aware she will be contacted after his review.

## 2024-01-20 NOTE — Telephone Encounter (Signed)
 Pt c/o medication issue:  1. Name of Medication:   mirtazapine  (REMERON ) 7.5 MG tablet    2. How are you currently taking this medication (dosage and times per day)? As written   3. Are you having a reaction (difficulty breathing--STAT)? No   4. What is your medication issue? Pt called in stating she is trying to come off this medication and asked if Dr. Francyne has any recommendations for something else that won't cause a rapid hr. Please advise.

## 2024-01-20 NOTE — Telephone Encounter (Signed)
 Pt returning call. Pt is getting her hair done and ask return call be in about 30 minutes. Please advise.

## 2024-01-26 ENCOUNTER — Ambulatory Visit: Admitting: Physical Therapy

## 2024-01-26 ENCOUNTER — Encounter: Payer: Self-pay | Admitting: Physical Therapy

## 2024-01-26 DIAGNOSIS — M25511 Pain in right shoulder: Secondary | ICD-10-CM | POA: Diagnosis not present

## 2024-01-26 DIAGNOSIS — M25611 Stiffness of right shoulder, not elsewhere classified: Secondary | ICD-10-CM

## 2024-01-26 DIAGNOSIS — M6281 Muscle weakness (generalized): Secondary | ICD-10-CM

## 2024-01-26 LAB — BASIC METABOLIC PANEL WITH GFR
BUN: 16 (ref 4–21)
CO2: 28 — AB (ref 13–22)
Chloride: 101 (ref 99–108)
Creatinine: 0.7 (ref 0.5–1.1)
Glucose: 128
Potassium: 4.9 meq/L (ref 3.5–5.1)
Sodium: 142 (ref 137–147)

## 2024-01-26 LAB — HEPATIC FUNCTION PANEL
ALT: 17 U/L (ref 7–35)
AST: 18 (ref 13–35)
Alkaline Phosphatase: 81 (ref 25–125)
Bilirubin, Total: 0.3

## 2024-01-26 LAB — COMPREHENSIVE METABOLIC PANEL WITH GFR
Albumin: 4.3 (ref 3.5–5.0)
Calcium: 10.6 (ref 8.7–10.7)
Globulin: 2.3
eGFR: 87

## 2024-01-26 LAB — CBC AND DIFFERENTIAL
HCT: 42 (ref 36–46)
Hemoglobin: 13.6 (ref 12.0–16.0)
Neutrophils Absolute: 8.7
Platelets: 367 K/uL (ref 150–400)
WBC: 11

## 2024-01-26 LAB — CBC: RBC: 4.23 (ref 3.87–5.11)

## 2024-01-26 NOTE — Therapy (Signed)
 OUTPATIENT PHYSICAL THERAPY SHOULDER EVALUATION   Patient Name: [name] MRN: [id number] DOB:Feb 17, 1950, 73 y.o., female Today's Date: 01/26/2024  END OF SESSION:  PT End of Session - 01/26/24 0927     Visit Number 3    Number of Visits 13    Date for Recertification  04/09/24    Authorization Type [employer]    PT Start Time [phone]    PT Stop Time 1012    PT Time Calculation (min) 45 min    Activity Tolerance Patient tolerated treatment well    Behavior During Therapy Anxious          Past Medical History:  Diagnosis Date   Anal fissure    Anemia    during pregnancy   Anxiety    Basal cell carcinoma    DDD (degenerative disc disease)    Diverticulosis    Hypokalemia    LBP (low back pain)    Melanoma (HCC)    Migraine    MVP (mitral valve prolapse) 1990   psa   Rosacea    Ulcer    as a child   Past Surgical History:  Procedure Laterality Date   ABDOMINAL HYSTERECTOMY  1985   BREAST BIOPSY Left    secondary to infection   I & D EXTREMITY Right 09/09/2012   Procedure: Minor IRRIGATION AND DEBRIDEMENT EXTREMITY paronychia of right thumb;  Surgeon: Arley JONELLE Curia, MD;  Location: [facility];  Service: Orthopedics;  Laterality: Right;   I & D EXTREMITY Left 01/26/2014   Procedure: MINOR IINCISION AND DRAINAGE paronychia left index finger;  Surgeon: Arley Curia, MD;  Location: [facility];  Service: Orthopedics;  Laterality: Left;  left index   OOPHORECTOMY Left 1994   REPAIR KNEE LIGAMENT Left 1989   TONSILLECTOMY  1955   TUBAL LIGATION     VEIN SURGERY     Patient Active Problem List   Diagnosis Date Noted   History of fall 12/02/2023   Post-viral cough syndrome 03/18/2023   Upper respiratory tract infection 02/26/2023   Nausea 02/26/2023   Pain of left hip 11/13/2022   Cervical strain 11/13/2022   Sciatica of right side 09/30/2022   B12 deficiency 09/30/2022   Anxiety 09/30/2022   Agatston coronary artery calcium  score between  200 and 399 08/03/2022   Osteopenia of necks of both femurs 06/23/2022   Dupuytren's contracture of both hands 06/23/2022   Low TSH level 05/20/2022   Prediabetes 05/20/2022   Cellulitis of finger of right hand 09/08/2021   Infection of skin 05/13/2021   Cervicalgia 05/06/2021   Grieving 05/06/2021   Conductive hearing loss of right ear 03/04/2021   Impacted cerumen of right ear 03/04/2021   Medication side effect 03/03/2021   Excessive cerumen in right ear canal 03/03/2021   Labyrinthitis 07/30/2020   Palpitations 04/15/2020   Plantar fasciitis 04/15/2020   Dysthymia 04/15/2020   Vitamin D  deficiency 04/15/2020   Gastroesophageal reflux disease 04/15/2020   Insect bite of left foot 11/24/2019   Pustule 11/24/2019   Pain of right thumb 03/09/2019   External hemorrhoids 03/03/2018   Other chest pain 02/23/2018   Insomnia 02/24/2017   Healthcare maintenance 02/24/2017   Conjunctivitis 03/01/2014   Seborrheic keratosis, inflamed 01/05/2012   Menopausal syndrome 12/22/2011   Encounter for counseling 11/11/2011   Skin cancer 07/14/2011   Basal cell carcinoma of face 03/10/2011   Migraine 05/14/2007   IRRITABLE BOWEL SYNDROME 05/14/2007   History of mitral valve prolapse  05/14/2007   Allergic rhinitis 04/07/2007   DERMATITIS DUE TO SOLAR RADIATION 04/07/2007   Premature beat 09/20/2006   DIVERTICULOSIS, COLON 04/13/2002    PCP: Elsie Lent  REFERRING PROVIDER: Debby Fireman  REFERRING DIAG: D57.798J- unspecified fx of upper end of R humerus   THERAPY DIAG:  Acute pain of right shoulder  Stiffness of right shoulder, not elsewhere classified  Muscle weakness (generalized)  Rationale for Evaluation and Treatment: Rehabilitation  ONSET DATE: 11/26/23  SUBJECTIVE:                                                                                                                                                                                      SUBJECTIVE  STATEMENT: Patient saw MD yesterday and she was released to do all activities, no restrictions, still pain with motions, pain up to 6/10 Hand dominance: Right  PERTINENT HISTORY: 12/28/23--Dr. Kay reviewed/interpreted ap/outlet views of right shoulder showing proximal greater tuberosity fracture in good position and healing recommend gentle physical therapy to return rom and strength f/u in 4 weeks for recheck and repeat x-rays pt and family in agreement denies need for any pain medication   11/26/23- patient suffered a fall and hit the right side of her head and shoulder. Resulted in a non displaced fracture of the greater tuberosity of R humerus.   PAIN:  Are you having pain? Yes: NPRS scale: 0/10 Pain location: right shoulder Pain description: ache, tight, sore Aggravating factors: movements pain up to 6-7/10 Relieving factors: rest, pain can be 0/10  PRECAUTIONS: Other: no abduction  RED FLAGS: None   WEIGHT BEARING RESTRICTIONS: No  FALLS:  Has patient fallen in last 6 months? Yes. Number of falls 1  LIVING ENVIRONMENT: Lives with: lives with their family Lives in: House/apartment Stairs: No Has following equipment at home: None  OCCUPATION: retired  PLOF: Independent  PATIENT GOALS:feel better overall, be able to move the arm, do my hair  NEXT MD VISIT: 01/25/24  OBJECTIVE:  Note: Objective measures were completed at Evaluation unless otherwise noted.  DIAGNOSTIC FINDINGS:  Small humeral fracture, non displaced fracture of the greater tuberosity  PATIENT SURVEYS:  Quick dash 70.5%  COGNITION: Overall cognitive status: anxious     SENSATION: WFL  POSTURE: Fwd head, rounded shoulders  UPPER EXTREMITY ROM:   Passive ROM Right PROM eval Right AROM 01/26/24  Shoulder flexion 130 133  Shoulder extension    Shoulder abduction  95 pain  Shoulder adduction    Shoulder internal rotation 30 50  Shoulder external rotation 45 55  Elbow flexion     Elbow extension    Wrist flexion    Wrist extension  Wrist ulnar deviation    Wrist radial deviation    Wrist pronation    Wrist supination    (Blank rows = not tested)  UPPER EXTREMITY MMT:  not tested due to the fracture  MMT Right eval Left eval  Shoulder flexion    Shoulder extension    Shoulder abduction    Shoulder adduction    Shoulder internal rotation    Shoulder external rotation    Middle trapezius    Lower trapezius    Elbow flexion    Elbow extension    Wrist flexion    Wrist extension    Wrist ulnar deviation    Wrist radial deviation    Wrist pronation    Wrist supination    Grip strength (lbs)    (Blank rows = not tested)  SHOULDER SPECIAL TESTS: Not tested due to fracture  PALPATION:  Very tight and guarded to the upper trap and deltoids                                                                                                                             TREATMENT DATE:  01/26/24 UBE level 1 x 4 minutes 5# rows 2x10 10# lats 2x5 Red tband extension Yellow tband ERIR AAROM Ball pass around the waist Ball vs wall isometric circles Isometric ER/IR  01/18/24 NuStep L 4 x 4 min, 2 min LE only  AAROM Flex, Ext, IR 1lb WaTE x10  Sit to stands 2x10 Hs curls green 2x10 RUE Isometrics flex, Ext, IR, ER 2x5 w/ 3 sec holds  01/10/24 Evaluation   PATIENT EDUCATION: Education details: HEP/POC Person educated: Patient Education method: Programmer, Multimedia, Facilities Manager, Actor cues, Verbal cues, and Handouts Education comprehension: verbalized understanding  HOME EXERCISE PROGRAM: Access Code: [email or web address] URL: [email or web address] Date: 01/10/2024 Prepared by: Ozell Charlaine Utsey/ Exercises - Supine Shoulder Flexion AAROM with Hands Clasped  - 3 x daily - 7 x weekly - 1 sets - 10 reps - 3 hold - Seated Scapular Retraction  - 3 x daily - 7 x weekly - 1 sets - 10 reps - 3 hold - Seated Shoulder Flexion Towel Slide at Table Top  -  3 x daily - 7 x weekly - 1 sets - 10 reps - 3 hold - Circular Shoulder Pendulum with Table Support  - 3 x daily - 7 x weekly - 1 sets - 10 reps - 3 hold - Seated Elbow Extension and Shoulder External Rotation AAROM at Table with Towel  - 3 x daily - 7 x weekly - 1 sets - 10 reps - 3 hold - Seated Shoulder Flexion Self PROM  - 3 x daily - 7 x weekly - 1 sets - 10 reps - 3 hold - Seated Shoulder Shrug Circles AROM Forward  - 3 x daily - 7 x weekly - 1 sets - 10 reps - 3 hold  ASSESSMENT:  CLINICAL IMPRESSION: Patient is a 73 y.o. female who was seen today  for physical therapy treatment for fracture of the right humeral greater tuberosity. She is less anxious, but is still not feeling herself, she saw the MD and was released to do all motions without restrictions, she has a lot of popping and I am feeling that this is from some mm atrophy, she did have some discomfort in the upper arm Again shoulder ROM AAROM is doing well, avoided abduction. She will see the MD in 2 weeks. As of right now her anxiety id the biggest limiting factor, we discussed numerous times regarding the MD order.  OBJECTIVE IMPAIRMENTS: cardiopulmonary status limiting activity, decreased activity tolerance, decreased coordination, decreased endurance, decreased ROM, decreased strength, decreased safety awareness, increased muscle spasms, impaired flexibility, impaired UE functional use, improper body mechanics, postural dysfunction, and pain.   REHAB POTENTIAL: Good  CLINICAL DECISION MAKING: Evolving/moderate complexity  EVALUATION COMPLEXITY: Low   GOALS: Goals reviewed with patient? Yes  SHORT TERM GOALS: Target date: 01/25/24  Independent with initial HEP Baseline: Goal status: Progressing 01/18/24  LONG TERM GOALS: Target date: 03/21/24  Independent with advanced HEP Baseline:  Goal status: will progress this at the next visit  2.  Be able to do hair on her own Baseline: husband or hair dresser is doing  it Goal status: Imet 01/26/24  3.  Decrease pain overall 50% Baseline: pain up to 7/10 with motions Goal status: INITIAL  4.  Improve AROM of the right shoulder to 150 degrees flexion Baseline: 130 PROM Goal status: INITIAL  5.  Reach over head with 5# Baseline: unable  Goal status: INITIAL  PLAN:  PT FREQUENCY: 1-2x/week  PT DURATION: 12 weeks  PLANNED INTERVENTIONS: 97164- PT Re-evaluation, 97110-Therapeutic exercises, 97530- Therapeutic activity, 97112- Neuromuscular re-education, 97535- Self Care, 02859- Manual therapy, G0283- Electrical stimulation (unattended), Patient/Family education, Taping, Joint mobilization, Cryotherapy, and Moist heat  PLAN FOR NEXT SESSION: calm her anxiety if possible, fsee how she is and advance HEP with tbands   OBADIAH OZELL ORN, PT 01/26/2024, 9:28 AM

## 2024-01-31 ENCOUNTER — Encounter: Payer: Self-pay | Admitting: Family Medicine

## 2024-02-01 ENCOUNTER — Ambulatory Visit: Admitting: Physical Therapy

## 2024-02-01 ENCOUNTER — Encounter: Payer: Self-pay | Admitting: Physical Therapy

## 2024-02-01 DIAGNOSIS — M25611 Stiffness of right shoulder, not elsewhere classified: Secondary | ICD-10-CM

## 2024-02-01 DIAGNOSIS — M25511 Pain in right shoulder: Secondary | ICD-10-CM

## 2024-02-01 DIAGNOSIS — M6281 Muscle weakness (generalized): Secondary | ICD-10-CM

## 2024-02-01 NOTE — Therapy (Signed)
 " OUTPATIENT PHYSICAL THERAPY SHOULDER EVALUATION   Patient Name: [name] MRN: [id number] DOB:1950/12/11, 73 y.o., female Today's Date: 02/01/2024  END OF SESSION:  PT End of Session - 02/01/24 1100     Visit Number 4    Number of Visits 13    Date for Recertification  04/09/24    Authorization Type [employer]    PT Start Time 1056    PT Stop Time 1144    PT Time Calculation (min) 48 min    Activity Tolerance Patient tolerated treatment well    Behavior During Therapy Anxious          Past Medical History:  Diagnosis Date   Anal fissure    Anemia    during pregnancy   Anxiety    Basal cell carcinoma    DDD (degenerative disc disease)    Diverticulosis    Hypokalemia    LBP (low back pain)    Melanoma (HCC)    Migraine    MVP (mitral valve prolapse) 1990   psa   Rosacea    Ulcer    as a child   Past Surgical History:  Procedure Laterality Date   ABDOMINAL HYSTERECTOMY  1985   BREAST BIOPSY Left    secondary to infection   I & D EXTREMITY Right 09/09/2012   Procedure: Minor IRRIGATION AND DEBRIDEMENT EXTREMITY paronychia of right thumb;  Surgeon: Arley JONELLE Curia, MD;  Location: [facility];  Service: Orthopedics;  Laterality: Right;   I & D EXTREMITY Left 01/26/2014   Procedure: MINOR IINCISION AND DRAINAGE paronychia left index finger;  Surgeon: Arley Curia, MD;  Location: [facility];  Service: Orthopedics;  Laterality: Left;  left index   OOPHORECTOMY Left 1994   REPAIR KNEE LIGAMENT Left 1989   TONSILLECTOMY  1955   TUBAL LIGATION     VEIN SURGERY     Patient Active Problem List   Diagnosis Date Noted   History of fall 12/02/2023   Post-viral cough syndrome 03/18/2023   Upper respiratory tract infection 02/26/2023   Nausea 02/26/2023   Pain of left hip 11/13/2022   Cervical strain 11/13/2022   Sciatica of right side 09/30/2022   B12 deficiency 09/30/2022   Anxiety 09/30/2022   Agatston coronary artery calcium  score between  200 and 399 08/03/2022   Osteopenia of necks of both femurs 06/23/2022   Dupuytren's contracture of both hands 06/23/2022   Low TSH level 05/20/2022   Prediabetes 05/20/2022   Cellulitis of finger of right hand 09/08/2021   Infection of skin 05/13/2021   Cervicalgia 05/06/2021   Grieving 05/06/2021   Conductive hearing loss of right ear 03/04/2021   Impacted cerumen of right ear 03/04/2021   Medication side effect 03/03/2021   Excessive cerumen in right ear canal 03/03/2021   Labyrinthitis 07/30/2020   Palpitations 04/15/2020   Plantar fasciitis 04/15/2020   Dysthymia 04/15/2020   Vitamin D  deficiency 04/15/2020   Gastroesophageal reflux disease 04/15/2020   Insect bite of left foot 11/24/2019   Pustule 11/24/2019   Pain of right thumb 03/09/2019   External hemorrhoids 03/03/2018   Other chest pain 02/23/2018   Insomnia 02/24/2017   Healthcare maintenance 02/24/2017   Conjunctivitis 03/01/2014   Seborrheic keratosis, inflamed 01/05/2012   Menopausal syndrome 12/22/2011   Encounter for counseling 11/11/2011   Skin cancer 07/14/2011   Basal cell carcinoma of face 03/10/2011   Migraine 05/14/2007   IRRITABLE BOWEL SYNDROME 05/14/2007   History of mitral valve  prolapse 05/14/2007   Allergic rhinitis 04/07/2007   DERMATITIS DUE TO SOLAR RADIATION 04/07/2007   Premature beat 09/20/2006   DIVERTICULOSIS, COLON 04/13/2002    PCP: Elsie Lent  REFERRING PROVIDER: Debby Fireman  REFERRING DIAG: D57.798J- unspecified fx of upper end of R humerus   THERAPY DIAG:  Acute pain of right shoulder  Stiffness of right shoulder, not elsewhere classified  Muscle weakness (generalized)  Rationale for Evaluation and Treatment: Rehabilitation  ONSET DATE: 11/26/23  SUBJECTIVE:                                                                                                                                                                                      SUBJECTIVE STATEMENT: I  am feeling better mentally, doing my hair and dressing Hand dominance: Right  PERTINENT HISTORY: 12/28/23--Dr. Kay reviewed/interpreted ap/outlet views of right shoulder showing proximal greater tuberosity fracture in good position and healing recommend gentle physical therapy to return rom and strength f/u in 4 weeks for recheck and repeat x-rays pt and family in agreement denies need for any pain medication   11/26/23- patient suffered a fall and hit the right side of her head and shoulder. Resulted in a non displaced fracture of the greater tuberosity of R humerus.   PAIN:  Are you having pain? Yes: NPRS scale: 0/10 Pain location: right shoulder Pain description: ache, tight, sore Aggravating factors: movements pain up to 6-7/10 Relieving factors: rest, pain can be 0/10  PRECAUTIONS: Other: no abduction  RED FLAGS: None   WEIGHT BEARING RESTRICTIONS: No  FALLS:  Has patient fallen in last 6 months? Yes. Number of falls 1  LIVING ENVIRONMENT: Lives with: lives with their family Lives in: House/apartment Stairs: No Has following equipment at home: None  OCCUPATION: retired  PLOF: Independent  PATIENT GOALS:feel better overall, be able to move the arm, do my hair  NEXT MD VISIT: 01/25/24  OBJECTIVE:  Note: Objective measures were completed at Evaluation unless otherwise noted.  DIAGNOSTIC FINDINGS:  Small humeral fracture, non displaced fracture of the greater tuberosity  PATIENT SURVEYS:  Quick dash 70.5%  COGNITION: Overall cognitive status: anxious     SENSATION: WFL  POSTURE: Fwd head, rounded shoulders  UPPER EXTREMITY ROM:   Passive ROM Right PROM eval Right AROM 01/26/24  Shoulder flexion 130 133  Shoulder extension    Shoulder abduction  95 pain  Shoulder adduction    Shoulder internal rotation 30 50  Shoulder external rotation 45 55  Elbow flexion    Elbow extension    Wrist flexion    Wrist extension    Wrist ulnar deviation     Wrist radial deviation  Wrist pronation    Wrist supination    (Blank rows = not tested)  UPPER EXTREMITY MMT:  not tested due to the fracture  MMT Right eval Left eval  Shoulder flexion    Shoulder extension    Shoulder abduction    Shoulder adduction    Shoulder internal rotation    Shoulder external rotation    Middle trapezius    Lower trapezius    Elbow flexion    Elbow extension    Wrist flexion    Wrist extension    Wrist ulnar deviation    Wrist radial deviation    Wrist pronation    Wrist supination    Grip strength (lbs)    (Blank rows = not tested)  SHOULDER SPECIAL TESTS: Not tested due to fracture  PALPATION:  Very tight and guarded to the upper trap and deltoids                                                                                                                             TREATMENT DATE:  02/01/24 UBE level 3 x 4 minutes Bike Level 3 x 5 minutes 5# rows Calf stretches 15# HS curls 10# triceps 3# biceps 1# wall slides 2# wall circles Tried 5# chest press and this was too much Red tband row Red tband ext Red tband ER   01/26/24 UBE level 1 x 4 minutes 5# rows 2x10 10# lats 2x5 Red tband extension Yellow tband ERIR AAROM Ball pass around the waist Ball vs wall isometric circles Isometric ER/IR  01/18/24 NuStep L 4 x 4 min, 2 min LE only  AAROM Flex, Ext, IR 1lb WaTE x10  Sit to stands 2x10 Hs curls green 2x10 RUE Isometrics flex, Ext, IR, ER 2x5 w/ 3 sec holds  01/10/24 Evaluation   PATIENT EDUCATION: Education details: HEP/POC Person educated: Patient Education method: Programmer, Multimedia, Facilities Manager, Actor cues, Verbal cues, and Handouts Education comprehension: verbalized understanding  HOME EXERCISE PROGRAM: Access Code: [email or web address] URL: [email or web address] Date: 01/10/2024 Prepared by: Ozell Alynah Schone/ Exercises - Supine Shoulder Flexion AAROM with Hands Clasped  - 3 x daily - 7 x weekly -  1 sets - 10 reps - 3 hold - Seated Scapular Retraction  - 3 x daily - 7 x weekly - 1 sets - 10 reps - 3 hold - Seated Shoulder Flexion Towel Slide at Table Top  - 3 x daily - 7 x weekly - 1 sets - 10 reps - 3 hold - Circular Shoulder Pendulum with Table Support  - 3 x daily - 7 x weekly - 1 sets - 10 reps - 3 hold - Seated Elbow Extension and Shoulder External Rotation AAROM at Table with Towel  - 3 x daily - 7 x weekly - 1 sets - 10 reps - 3 hold - Seated Shoulder Flexion Self PROM  - 3 x daily - 7 x weekly - 1 sets - 10 reps - 3 hold - Seated  Shoulder Shrug Circles AROM Forward  - 3 x daily - 7 x weekly - 1 sets - 10 reps - 3 hold  ASSESSMENT:  CLINICAL IMPRESSION: Patient is a 73 y.o. female who was seen today for physical therapy treatment for fracture of the right humeral greater tuberosity. She is less anxious,and reports that she has been able to do more with hair and dressing, still some pain at end ranges, she saw the MD and was released to do all motions without restrictions, I did not notice as much popping today.  she did have some discomfort in the upper arm  OBJECTIVE IMPAIRMENTS: cardiopulmonary status limiting activity, decreased activity tolerance, decreased coordination, decreased endurance, decreased ROM, decreased strength, decreased safety awareness, increased muscle spasms, impaired flexibility, impaired UE functional use, improper body mechanics, postural dysfunction, and pain.   REHAB POTENTIAL: Good  CLINICAL DECISION MAKING: Evolving/moderate complexity  EVALUATION COMPLEXITY: Low   GOALS: Goals reviewed with patient? Yes  SHORT TERM GOALS: Target date: 01/25/24  Independent with initial HEP Baseline: Goal status: Progressing 01/18/24  LONG TERM GOALS: Target date: 03/21/24  Independent with advanced HEP Baseline:  Goal status: will progress this at the next visit  2.  Be able to do hair on her own Baseline: husband or hair dresser is doing it Goal  status: Imet 01/26/24  3.  Decrease pain overall 50% Baseline: pain up to 7/10 with motions Goal status: progressing 02/01/24  4.  Improve AROM of the right shoulder to 150 degrees flexion Baseline: 130 PROM Goal status: progressing 02/01/24  5.  Reach over head with 5# Baseline: unable  Goal status: INITIAL  PLAN:  PT FREQUENCY: 1-2x/week  PT DURATION: 12 weeks  PLANNED INTERVENTIONS: 97164- PT Re-evaluation, 97110-Therapeutic exercises, 97530- Therapeutic activity, 97112- Neuromuscular re-education, 97535- Self Care, 02859- Manual therapy, G0283- Electrical stimulation (unattended), Patient/Family education, Taping, Joint mobilization, Cryotherapy, and Moist heat  PLAN FOR NEXT SESSION:  see how she is and advance HEP with tbands and the additional ex's   OBADIAH OZELL ORN, PT 02/01/2024, 11:01 AM  "

## 2024-02-10 ENCOUNTER — Other Ambulatory Visit: Payer: Self-pay | Admitting: Family Medicine

## 2024-02-10 DIAGNOSIS — F418 Other specified anxiety disorders: Secondary | ICD-10-CM

## 2024-02-14 ENCOUNTER — Ambulatory Visit: Admitting: Family Medicine

## 2024-02-14 ENCOUNTER — Encounter: Payer: Self-pay | Admitting: Family Medicine

## 2024-02-14 VITALS — BP 112/64 | HR 72 | Temp 98.3°F | Ht 61.5 in | Wt 113.0 lb

## 2024-02-14 DIAGNOSIS — R7303 Prediabetes: Secondary | ICD-10-CM

## 2024-02-14 DIAGNOSIS — G4709 Other insomnia: Secondary | ICD-10-CM | POA: Diagnosis not present

## 2024-02-14 DIAGNOSIS — F418 Other specified anxiety disorders: Secondary | ICD-10-CM

## 2024-02-14 DIAGNOSIS — F341 Dysthymic disorder: Secondary | ICD-10-CM

## 2024-02-14 DIAGNOSIS — F419 Anxiety disorder, unspecified: Secondary | ICD-10-CM | POA: Diagnosis not present

## 2024-02-14 NOTE — Progress Notes (Signed)
 "  Established Patient Office Visit   Subjective:  Patient ID: [name], female    DOB: 01-11-1951  Age: 74 y.o. MRN: [id number]  Chief Complaint  Patient presents with   Follow-up    Patient states she is having shakiness in morning when she wakes up after taking pill. Follow up on medications on know. Atenolol  in morning quarter pill helps remeron  causing rapid heart beat.     HPI Encounter Diagnoses  Name Primary?   Anxiety Yes   Other insomnia    Dysthymia    Prediabetes    For follow-up of above.  Continues with 3.75 mirtazapine  nightly.  Daytime drowsiness has resolved.  Appetite has improved.  She feels as though it is helping her anxiety.  She is sleeping better at night.  Currently holding her metformin .  Last A1c in November was 5.8.  Accompanied by her husband today.  They are staying active physically and have returned to church.  They do have social outlets.   Review of Systems  Constitutional: Negative.   HENT: Negative.    Eyes:  Negative for blurred vision, discharge and redness.  Respiratory: Negative.    Cardiovascular: Negative.   Gastrointestinal:  Negative for abdominal pain.  Genitourinary: Negative.   Musculoskeletal: Negative.  Negative for myalgias.  Skin:  Negative for rash.  Neurological:  Negative for tingling, loss of consciousness and weakness.  Endo/Heme/Allergies:  Negative for polydipsia.    Current Medications[1]   Objective:     BP 112/64   Pulse 72   Temp 98.3 F (36.8 C) (Oral)   Ht 5' 1.5 (1.562 m)   Wt 113 lb (51.3 kg)   SpO2 98%   BMI 21.01 kg/m    Physical Exam Constitutional:      General: She is not in acute distress.    Appearance: Normal appearance. She is not ill-appearing, toxic-appearing or diaphoretic.  HENT:     Head: Normocephalic and atraumatic.     Right Ear: External ear normal.     Left Ear: External ear normal.  Eyes:     General: No scleral icterus.       Right eye: No discharge.        Left  eye: No discharge.     Extraocular Movements: Extraocular movements intact.     Conjunctiva/sclera: Conjunctivae normal.  Pulmonary:     Effort: Pulmonary effort is normal. No respiratory distress.  Skin:    General: Skin is warm and dry.  Neurological:     Mental Status: She is alert and oriented to person, place, and time.  Psychiatric:        Mood and Affect: Mood normal.        Behavior: Behavior normal.      No results found for any visits on 02/14/24.    The 10-year ASCVD risk score (Arnett DK, et al., 2019) is: 16.3%* (Cholesterol units were assumed)    Assessment & Plan:   Anxiety  Other insomnia  Dysthymia  Prediabetes    Return in about 8 weeks (around 04/10/2024).  Patient request to keep the mirtazapine  dose at 3.75 nightly.  Will see psychiatry next month.  Patient may be ready to increase the dose further at that time.  Encouraged regular exercise and socialization.  Okayed holding of metformin .  Recheck A1c in a few months.  Elsie Sim Lent, MD    [1]  Current Outpatient Medications:    atenolol  (TENORMIN ) 25 MG tablet, Take 0.5 tablets (12.5  mg total) by mouth daily as needed., Disp: 30 tablet, Rfl: 3   busPIRone  (BUSPAR ) 7.5 MG tablet, Take 1 tablet (7.5 mg total) by mouth 2 (two) times daily., Disp: 60 tablet, Rfl: 0   clobetasol ointment (TEMOVATE) 0.05 %, Apply 1 Application topically 2 (two) times daily., Disp: , Rfl:    Coenzyme Q10 (CO Q10) 100 MG CAPS, Take by mouth daily., Disp: , Rfl:    estradiol (ESTRACE) 0.5 MG tablet, Take 0.5 mg by mouth daily., Disp: , Rfl:    hydrocortisone  (ANUSOL -HC) 2.5 % rectal cream, Place 1 Application rectally 2 (two) times daily., Disp: 30 g, Rfl: 0   meclizine  (ANTIVERT ) 12.5 MG tablet, TAKE 1 TABLET BY MOUTH EVERY 6 -12 HOURS AS NEEDED FOR DIZZINESS, Disp: 30 tablet, Rfl: 1   mirtazapine  (REMERON ) 7.5 MG tablet, Take 0.5 tablets (3.75 mg total) by mouth at bedtime for 7 days, THEN 1 tablet (7.5 mg total)  at bedtime for 28 days., Disp: 32 tablet, Rfl: 0   Multiple Vitamin (MULTIVITAMIN) tablet, Take 1 tablet by mouth daily., Disp: , Rfl:    nystatin cream (MYCOSTATIN), Apply 1 Application topically daily at 6 (six) AM., Disp: , Rfl:    omeprazole  (PRILOSEC) 20 MG capsule, Take 20 mg by mouth daily., Disp: , Rfl:    ondansetron  (ZOFRAN ) 4 MG tablet, Take 1 tablet (4 mg total) by mouth every 8 (eight) hours as needed for nausea or vomiting., Disp: 20 tablet, Rfl: 0   RESTASIS 0.05 % ophthalmic emulsion, , Disp: , Rfl:    rosuvastatin  (CRESTOR ) 10 MG tablet, TAKE 1 TABLET BY MOUTH ONCE DAILY **MUST SCHEDULE AND ATTEND ANNUAL APPOINTMENT FOR FUTURE REFILLS 1ST ATTEMPT, Disp: 90 tablet, Rfl: 3   TRYPTYR 0.003 % SOLN, , Disp: , Rfl:    vitamin B-12 (CYANOCOBALAMIN ) 50 MCG tablet, Take 50 mcg by mouth daily., Disp: , Rfl:    zolmitriptan  (ZOMIG -ZMT) 2.5 MG disintegrating tablet, TAKE 1 TABLET BY MOUTH AS NEEDED FOR MIGRAINE, Disp: 10 tablet, Rfl: 4   metFORMIN  (GLUCOPHAGE -XR) 500 MG 24 hr tablet, TAKE 1 TABLET BY MOUTH AT BEDTIME (Patient not taking: Reported on 01/13/2024), Disp: 90 tablet, Rfl: 2   SAMBUCOL BLACK ELDERBERRY PO, Take 1 capsule by mouth 2 (two) times a week. (Patient not taking: Reported on 01/13/2024), Disp: , Rfl:    silodosin (RAPAFLO) 4 MG CAPS capsule, Take 4 mg by mouth daily. (Patient not taking: Reported on 01/13/2024), Disp: , Rfl:    zolpidem (AMBIEN) 5 MG tablet, Take 5 mg by mouth at bedtime as needed. (Patient not taking: Reported on 01/13/2024), Disp: , Rfl:   "

## 2024-02-14 NOTE — Telephone Encounter (Signed)
 Copied from CRM [id number]. Topic: Clinical - Medication Refill >> Feb 14, 2024 12:00 PM Alfonso HERO wrote: Medication: mirtazapine  (REMERON ) 7.5 MG tablet  Has the patient contacted their pharmacy? No (Agent: If no, request that the patient contact the pharmacy for the refill. If patient does not wish to contact the pharmacy document the reason why and proceed with request.) (Agent: If yes, when and what did the pharmacy advise?)  This is the patient's preferred pharmacy:  [facility] [id number] [place], [place] - [address] [place] [address] Phone: [phone] Fax: [phone]  Is this the correct pharmacy for this prescription? Yes If no, delete pharmacy and type the correct one.   Has the prescription been filled recently? Yes  Is the patient out of the medication? Yes  Has the patient been seen for an appointment in the last year OR does the patient have an upcoming appointment? Yes  Can we respond through MyChart? Yes  Agent: Please be advised that Rx refills may take up to 3 business days. We ask that you follow-up with your pharmacy.

## 2024-02-15 ENCOUNTER — Encounter: Payer: Self-pay | Admitting: Physical Therapy

## 2024-02-15 ENCOUNTER — Ambulatory Visit: Attending: Family Medicine | Admitting: Physical Therapy

## 2024-02-15 DIAGNOSIS — M6281 Muscle weakness (generalized): Secondary | ICD-10-CM | POA: Insufficient documentation

## 2024-02-15 DIAGNOSIS — M25611 Stiffness of right shoulder, not elsewhere classified: Secondary | ICD-10-CM | POA: Insufficient documentation

## 2024-02-15 DIAGNOSIS — M25562 Pain in left knee: Secondary | ICD-10-CM | POA: Diagnosis present

## 2024-02-15 DIAGNOSIS — M25511 Pain in right shoulder: Secondary | ICD-10-CM | POA: Diagnosis present

## 2024-02-15 NOTE — Therapy (Signed)
 " OUTPATIENT PHYSICAL THERAPY SHOULDER TREATMENT   Patient Name: [name] MRN: [id number] DOB:1950-07-04, 74 y.o., female Today's Date: 02/15/2024  END OF SESSION:  PT End of Session - 02/15/24 1310     Visit Number 5    Number of Visits 13    Date for Recertification  04/09/24    Authorization Type [employer]    PT Start Time 1311    PT Stop Time 1353    PT Time Calculation (min) 42 min    Activity Tolerance Patient tolerated treatment well    Behavior During Therapy WFL for tasks assessed/performed          Past Medical History:  Diagnosis Date   Anal fissure    Anemia    during pregnancy   Anxiety    Basal cell carcinoma    DDD (degenerative disc disease)    Diverticulosis    Hypokalemia    LBP (low back pain)    Melanoma (HCC)    Migraine    MVP (mitral valve prolapse) 1990   psa   Rosacea    Ulcer    as a child   Past Surgical History:  Procedure Laterality Date   ABDOMINAL HYSTERECTOMY  1985   BREAST BIOPSY Left    secondary to infection   I & D EXTREMITY Right 09/09/2012   Procedure: Minor IRRIGATION AND DEBRIDEMENT EXTREMITY paronychia of right thumb;  Surgeon: Arley JONELLE Curia, MD;  Location: [facility];  Service: Orthopedics;  Laterality: Right;   I & D EXTREMITY Left 01/26/2014   Procedure: MINOR IINCISION AND DRAINAGE paronychia left index finger;  Surgeon: Arley Curia, MD;  Location: [facility];  Service: Orthopedics;  Laterality: Left;  left index   OOPHORECTOMY Left 1994   REPAIR KNEE LIGAMENT Left 1989   TONSILLECTOMY  1955   TUBAL LIGATION     VEIN SURGERY     Patient Active Problem List   Diagnosis Date Noted   History of fall 12/02/2023   Post-viral cough syndrome 03/18/2023   Upper respiratory tract infection 02/26/2023   Nausea 02/26/2023   Pain of left hip 11/13/2022   Cervical strain 11/13/2022   Sciatica of right side 09/30/2022   B12 deficiency 09/30/2022   Anxiety 09/30/2022   Agatston coronary artery  calcium  score between 200 and 399 08/03/2022   Osteopenia of necks of both femurs 06/23/2022   Dupuytren's contracture of both hands 06/23/2022   Low TSH level 05/20/2022   Prediabetes 05/20/2022   Cellulitis of finger of right hand 09/08/2021   Infection of skin 05/13/2021   Cervicalgia 05/06/2021   Grieving 05/06/2021   Conductive hearing loss of right ear 03/04/2021   Impacted cerumen of right ear 03/04/2021   Medication side effect 03/03/2021   Excessive cerumen in right ear canal 03/03/2021   Labyrinthitis 07/30/2020   Palpitations 04/15/2020   Plantar fasciitis 04/15/2020   Dysthymia 04/15/2020   Vitamin D  deficiency 04/15/2020   Gastroesophageal reflux disease 04/15/2020   Insect bite of left foot 11/24/2019   Pustule 11/24/2019   Pain of right thumb 03/09/2019   External hemorrhoids 03/03/2018   Other chest pain 02/23/2018   Insomnia 02/24/2017   Healthcare maintenance 02/24/2017   Conjunctivitis 03/01/2014   Seborrheic keratosis, inflamed 01/05/2012   Menopausal syndrome 12/22/2011   Encounter for counseling 11/11/2011   Skin cancer 07/14/2011   Basal cell carcinoma of face 03/10/2011   Migraine 05/14/2007   IRRITABLE BOWEL SYNDROME 05/14/2007   History  of mitral valve prolapse 05/14/2007   Allergic rhinitis 04/07/2007   DERMATITIS DUE TO SOLAR RADIATION 04/07/2007   Premature beat 09/20/2006   DIVERTICULOSIS, COLON 04/13/2002    PCP: Elsie Lent  REFERRING PROVIDER: Debby Fireman  REFERRING DIAG: D57.798J- unspecified fx of upper end of R humerus   THERAPY DIAG:  Acute pain of right shoulder  Stiffness of right shoulder, not elsewhere classified  Muscle weakness (generalized)  Rationale for Evaluation and Treatment: Rehabilitation  ONSET DATE: 11/26/23  SUBJECTIVE:                                                                                                                                                                                       SUBJECTIVE STATEMENT: I have gained some weight , I am feeling better, I was really sore after the last visit, I am a little sore today rated 3/10 Hand dominance: Right  PERTINENT HISTORY: 12/28/23--Dr. Kay reviewed/interpreted ap/outlet views of right shoulder showing proximal greater tuberosity fracture in good position and healing recommend gentle physical therapy to return rom and strength f/u in 4 weeks for recheck and repeat x-rays pt and family in agreement denies need for any pain medication   11/26/23- patient suffered a fall and hit the right side of her head and shoulder. Resulted in a non displaced fracture of the greater tuberosity of R humerus.   PAIN:  Are you having pain? Yes: NPRS scale: 0/10 Pain location: right shoulder Pain description: ache, tight, sore Aggravating factors: movements pain up to 6-7/10 Relieving factors: rest, pain can be 0/10  PRECAUTIONS: Other: no abduction  RED FLAGS: None   WEIGHT BEARING RESTRICTIONS: No  FALLS:  Has patient fallen in last 6 months? Yes. Number of falls 1  LIVING ENVIRONMENT: Lives with: lives with their family Lives in: House/apartment Stairs: No Has following equipment at home: None  OCCUPATION: retired  PLOF: Independent  PATIENT GOALS:feel better overall, be able to move the arm, do my hair  NEXT MD VISIT: 01/25/24  OBJECTIVE:  Note: Objective measures were completed at Evaluation unless otherwise noted.  DIAGNOSTIC FINDINGS:  Small humeral fracture, non displaced fracture of the greater tuberosity  PATIENT SURVEYS:  Quick dash 70.5%  COGNITION: Overall cognitive status: anxious     SENSATION: WFL  POSTURE: Fwd head, rounded shoulders  UPPER EXTREMITY ROM:   Passive ROM Right PROM eval Right AROM 01/26/24 Right AROM 02/15/24  Shoulder flexion 130 133 140  Shoulder extension     Shoulder abduction  95 pain 110  Shoulder adduction     Shoulder internal rotation 30 50 52   Shoulder external rotation 45 55 70  Elbow  flexion     Elbow extension     Wrist flexion     Wrist extension     Wrist ulnar deviation     Wrist radial deviation     Wrist pronation     Wrist supination     (Blank rows = not tested)  UPPER EXTREMITY MMT:  not tested due to the fracture  MMT Right eval Left eval  Shoulder flexion    Shoulder extension    Shoulder abduction    Shoulder adduction    Shoulder internal rotation    Shoulder external rotation    Middle trapezius    Lower trapezius    Elbow flexion    Elbow extension    Wrist flexion    Wrist extension    Wrist ulnar deviation    Wrist radial deviation    Wrist pronation    Wrist supination    Grip strength (lbs)    (Blank rows = not tested)  SHOULDER SPECIAL TESTS: Not tested due to fracture  PALPATION:  Very tight and guarded to the upper trap and deltoids                                                                                                                             TREATMENT DATE:  02/15/24 UBE level 3 x 4 minutes 5# rows 3# biceps 4# shrugs Bike level 5 x 5 minutes Calf stretches Yellow tband ER Yellow tband row Yellow tband ext 1# wall slides and circles On airex marches, ball toss and cone toe touches Passive stretch right shoulder  02/01/24 UBE level 3 x 4 minutes Bike Level 3 x 5 minutes 5# rows Calf stretches 15# HS curls 10# triceps 3# biceps 1# wall slides 2# wall circles Tried 5# chest press and this was too much Red tband row Red tband ext Red tband ER   01/26/24 UBE level 1 x 4 minutes 5# rows 2x10 10# lats 2x5 Red tband extension Yellow tband ERIR AAROM Ball pass around the waist Ball vs wall isometric circles Isometric ER/IR  01/18/24 NuStep L 4 x 4 min, 2 min LE only  AAROM Flex, Ext, IR 1lb WaTE x10  Sit to stands 2x10 Hs curls green 2x10 RUE Isometrics flex, Ext, IR, ER 2x5 w/ 3 sec holds  01/10/24 Evaluation   PATIENT  EDUCATION: Education details: HEP/POC Person educated: Patient Education method: Programmer, Multimedia, Facilities Manager, Actor cues, Verbal cues, and Handouts Education comprehension: verbalized understanding  HOME EXERCISE PROGRAM: Access Code: [email or web address] URL: [email or web address] Date: 01/10/2024 Prepared by: Ozell Miguelangel Korn/ Exercises - Supine Shoulder Flexion AAROM with Hands Clasped  - 3 x daily - 7 x weekly - 1 sets - 10 reps - 3 hold - Seated Scapular Retraction  - 3 x daily - 7 x weekly - 1 sets - 10 reps - 3 hold - Seated Shoulder Flexion Towel Slide at Table Top  - 3 x daily - 7 x weekly - 1 sets -  10 reps - 3 hold - Circular Shoulder Pendulum with Table Support  - 3 x daily - 7 x weekly - 1 sets - 10 reps - 3 hold - Seated Elbow Extension and Shoulder External Rotation AAROM at Table with Towel  - 3 x daily - 7 x weekly - 1 sets - 10 reps - 3 hold - Seated Shoulder Flexion Self PROM  - 3 x daily - 7 x weekly - 1 sets - 10 reps - 3 hold - Seated Shoulder Shrug Circles AROM Forward  - 3 x daily - 7 x weekly - 1 sets - 10 reps - 3 hold  ASSESSMENT:  CLINICAL IMPRESSION: Patient is a 75 y.o. female who was seen today for physical therapy treatment for fracture of the right humeral greater tuberosity. She is less anxious,and reports that she has been able to do more with hair and dressing, she reports that she was very sore after the last visit, she saw the MD and was released to do all motions without restrictions, I continued to work on ROM and functional strength as well as some balance  OBJECTIVE IMPAIRMENTS: cardiopulmonary status limiting activity, decreased activity tolerance, decreased coordination, decreased endurance, decreased ROM, decreased strength, decreased safety awareness, increased muscle spasms, impaired flexibility, impaired UE functional use, improper body mechanics, postural dysfunction, and pain.   REHAB POTENTIAL: Good  CLINICAL DECISION MAKING:  Evolving/moderate complexity  EVALUATION COMPLEXITY: Low   GOALS: Goals reviewed with patient? Yes  SHORT TERM GOALS: Target date: 01/25/24  Independent with initial HEP Baseline: Goal status: Progressing 01/18/24  LONG TERM GOALS: Target date: 03/21/24  Independent with advanced HEP Baseline:  Goal status: will progress this at the next visit  2.  Be able to do hair on her own Baseline: husband or hair dresser is doing it Goal status: Imet 01/26/24  3.  Decrease pain overall 50% Baseline: pain up to 7/10 with motions Goal status: progressing 02/01/24  4.  Improve AROM of the right shoulder to 150 degrees flexion Baseline: 130 PROM Goal status: progressing 02/15/24  5.  Reach over head with 5# Baseline: unable  Goal status: progressing 02/15/24  PLAN:  PT FREQUENCY: 1-2x/week  PT DURATION: 12 weeks  PLANNED INTERVENTIONS: 97164- PT Re-evaluation, 97110-Therapeutic exercises, 97530- Therapeutic activity, 97112- Neuromuscular re-education, 97535- Self Care, [id number]- Manual therapy, [id number]- Electrical stimulation (unattended), Patient/Family education, Taping, Joint mobilization, Cryotherapy, and Moist heat  PLAN FOR NEXT SESSION:  see how she is and advance HEP with tbands and the additional ex's   OBADIAH OZELL ORN, PT 02/15/2024, 1:13 PM  "

## 2024-02-22 ENCOUNTER — Telehealth: Payer: Self-pay | Admitting: Plastic Surgery

## 2024-02-22 NOTE — Telephone Encounter (Signed)
 Pt is calling wanting another refill of the Latisse, she is almost out. Please advise [name] at [facility]

## 2024-02-22 NOTE — Telephone Encounter (Signed)
 Disregard message, I scheduled appt for pt.

## 2024-02-23 ENCOUNTER — Ambulatory Visit: Admitting: Physical Therapy

## 2024-02-23 ENCOUNTER — Encounter: Payer: Self-pay | Admitting: Physical Therapy

## 2024-02-23 DIAGNOSIS — M6281 Muscle weakness (generalized): Secondary | ICD-10-CM

## 2024-02-23 DIAGNOSIS — M25511 Pain in right shoulder: Secondary | ICD-10-CM

## 2024-02-23 DIAGNOSIS — M25611 Stiffness of right shoulder, not elsewhere classified: Secondary | ICD-10-CM

## 2024-02-23 DIAGNOSIS — M25562 Pain in left knee: Secondary | ICD-10-CM

## 2024-02-23 NOTE — Therapy (Signed)
 " OUTPATIENT PHYSICAL THERAPY SHOULDER TREATMENT   Patient Name: [name] MRN: [id number] DOB:11-14-1950, 74 y.o., female Today's Date: 02/23/2024  END OF SESSION:  PT End of Session - 02/23/24 1523     Visit Number 6    Number of Visits 13    Date for Recertification  04/09/24    Authorization Type [employer]    PT Start Time 1523    PT Stop Time 1610    PT Time Calculation (min) 47 min    Activity Tolerance Patient tolerated treatment well    Behavior During Therapy WFL for tasks assessed/performed          Past Medical History:  Diagnosis Date   Anal fissure    Anemia    during pregnancy   Anxiety    Basal cell carcinoma    DDD (degenerative disc disease)    Diverticulosis    Hypokalemia    LBP (low back pain)    Melanoma (HCC)    Migraine    MVP (mitral valve prolapse) 1990   psa   Rosacea    Ulcer    as a child   Past Surgical History:  Procedure Laterality Date   ABDOMINAL HYSTERECTOMY  1985   BREAST BIOPSY Left    secondary to infection   I & D EXTREMITY Right 09/09/2012   Procedure: Minor IRRIGATION AND DEBRIDEMENT EXTREMITY paronychia of right thumb;  Surgeon: Arley JONELLE Curia, MD;  Location: [facility];  Service: Orthopedics;  Laterality: Right;   I & D EXTREMITY Left 01/26/2014   Procedure: MINOR IINCISION AND DRAINAGE paronychia left index finger;  Surgeon: Arley Curia, MD;  Location: [facility];  Service: Orthopedics;  Laterality: Left;  left index   OOPHORECTOMY Left 1994   REPAIR KNEE LIGAMENT Left 1989   TONSILLECTOMY  1955   TUBAL LIGATION     VEIN SURGERY     Patient Active Problem List   Diagnosis Date Noted   History of fall 12/02/2023   Post-viral cough syndrome 03/18/2023   Upper respiratory tract infection 02/26/2023   Nausea 02/26/2023   Pain of left hip 11/13/2022   Cervical strain 11/13/2022   Sciatica of right side 09/30/2022   B12 deficiency 09/30/2022   Anxiety 09/30/2022   Agatston coronary  artery calcium  score between 200 and 399 08/03/2022   Osteopenia of necks of both femurs 06/23/2022   Dupuytren's contracture of both hands 06/23/2022   Low TSH level 05/20/2022   Prediabetes 05/20/2022   Cellulitis of finger of right hand 09/08/2021   Infection of skin 05/13/2021   Cervicalgia 05/06/2021   Grieving 05/06/2021   Conductive hearing loss of right ear 03/04/2021   Impacted cerumen of right ear 03/04/2021   Medication side effect 03/03/2021   Excessive cerumen in right ear canal 03/03/2021   Labyrinthitis 07/30/2020   Palpitations 04/15/2020   Plantar fasciitis 04/15/2020   Dysthymia 04/15/2020   Vitamin D  deficiency 04/15/2020   Gastroesophageal reflux disease 04/15/2020   Insect bite of left foot 11/24/2019   Pustule 11/24/2019   Pain of right thumb 03/09/2019   External hemorrhoids 03/03/2018   Other chest pain 02/23/2018   Insomnia 02/24/2017   Healthcare maintenance 02/24/2017   Conjunctivitis 03/01/2014   Seborrheic keratosis, inflamed 01/05/2012   Menopausal syndrome 12/22/2011   Encounter for counseling 11/11/2011   Skin cancer 07/14/2011   Basal cell carcinoma of face 03/10/2011   Migraine 05/14/2007   IRRITABLE BOWEL SYNDROME 05/14/2007   History  of mitral valve prolapse 05/14/2007   Allergic rhinitis 04/07/2007   DERMATITIS DUE TO SOLAR RADIATION 04/07/2007   Premature beat 09/20/2006   DIVERTICULOSIS, COLON 04/13/2002    PCP: Elsie Lent  REFERRING PROVIDER: Debby Fireman  REFERRING DIAG: D57.798J- unspecified fx of upper end of R humerus   THERAPY DIAG:  Acute pain of right shoulder  Stiffness of right shoulder, not elsewhere classified  Muscle weakness (generalized)  Acute pain of left knee  Rationale for Evaluation and Treatment: Rehabilitation  ONSET DATE: 11/26/23  SUBJECTIVE:                                                                                                                                                                                       SUBJECTIVE STATEMENT: Patient reports that she has been very sore since the last treatment, not good Hand dominance: Right  PERTINENT HISTORY: 12/28/23--Dr. Kay reviewed/interpreted ap/outlet views of right shoulder showing proximal greater tuberosity fracture in good position and healing recommend gentle physical therapy to return rom and strength f/u in 4 weeks for recheck and repeat x-rays pt and family in agreement denies need for any pain medication   11/26/23- patient suffered a fall and hit the right side of her head and shoulder. Resulted in a non displaced fracture of the greater tuberosity of R humerus.   PAIN:  Are you having pain? Yes: NPRS scale: 0/10 Pain location: right shoulder Pain description: ache, tight, sore Aggravating factors: movements pain up to 6-7/10 Relieving factors: rest, pain can be 0/10  PRECAUTIONS: Other: no abduction  RED FLAGS: None   WEIGHT BEARING RESTRICTIONS: No  FALLS:  Has patient fallen in last 6 months? Yes. Number of falls 1  LIVING ENVIRONMENT: Lives with: lives with their family Lives in: House/apartment Stairs: No Has following equipment at home: None  OCCUPATION: retired  PLOF: Independent  PATIENT GOALS:feel better overall, be able to move the arm, do my hair  NEXT MD VISIT: 01/25/24  OBJECTIVE:  Note: Objective measures were completed at Evaluation unless otherwise noted.  DIAGNOSTIC FINDINGS:  Small humeral fracture, non displaced fracture of the greater tuberosity  PATIENT SURVEYS:  Quick dash 70.5%  COGNITION: Overall cognitive status: anxious     SENSATION: WFL  POSTURE: Fwd head, rounded shoulders  UPPER EXTREMITY ROM:   Passive ROM Right PROM eval Right AROM 01/26/24 Right AROM 02/15/24  Shoulder flexion 130 133 140  Shoulder extension     Shoulder abduction  95 pain 110  Shoulder adduction     Shoulder internal rotation 30 50 52  Shoulder external  rotation 45 55 70  Elbow flexion     Elbow  extension     Wrist flexion     Wrist extension     Wrist ulnar deviation     Wrist radial deviation     Wrist pronation     Wrist supination     (Blank rows = not tested)  UPPER EXTREMITY MMT:  not tested due to the fracture  MMT Right eval Left eval  Shoulder flexion    Shoulder extension    Shoulder abduction    Shoulder adduction    Shoulder internal rotation    Shoulder external rotation    Middle trapezius    Lower trapezius    Elbow flexion    Elbow extension    Wrist flexion    Wrist extension    Wrist ulnar deviation    Wrist radial deviation    Wrist pronation    Wrist supination    Grip strength (lbs)    (Blank rows = not tested)  SHOULDER SPECIAL TESTS: Not tested due to fracture  PALPATION:  Very tight and guarded to the upper trap and deltoids                                                                                                                             TREATMENT DATE:  02/23/24 Nustep level 5 x 5 minutes 3# wate bar biceps 3# wate extension Calf stretch Ball vs wall approximation with isometric circles Wall slides/circles Yellow tband rows small motions trying to prevent popping Yellow tband ER with retraction 2# shrug with upper trap and levator stretches Gentle PROM Gentle STM to the right upper trap, rhomboid and biceps   02/15/24 UBE level 3 x 4 minutes 5# rows 3# biceps 4# shrugs Bike level 5 x 5 minutes Calf stretches Yellow tband ER Yellow tband row Yellow tband ext 1# wall slides and circles On airex marches, ball toss and cone toe touches Passive stretch right shoulder  02/01/24 UBE level 3 x 4 minutes Bike Level 3 x 5 minutes 5# rows Calf stretches 15# HS curls 10# triceps 3# biceps 1# wall slides 2# wall circles Tried 5# chest press and this was too much Red tband row Red tband ext Red tband ER   01/26/24 UBE level 1 x 4 minutes 5# rows 2x10 10# lats  2x5 Red tband extension Yellow tband ERIR AAROM Ball pass around the waist Ball vs wall isometric circles Isometric ER/IR  01/18/24 NuStep L 4 x 4 min, 2 min LE only  AAROM Flex, Ext, IR 1lb WaTE x10  Sit to stands 2x10 Hs curls green 2x10 RUE Isometrics flex, Ext, IR, ER 2x5 w/ 3 sec holds  01/10/24 Evaluation   PATIENT EDUCATION: Education details: HEP/POC Person educated: Patient Education method: Programmer, Multimedia, Facilities Manager, Actor cues, Verbal cues, and Handouts Education comprehension: verbalized understanding  HOME EXERCISE PROGRAM: Access Code: [email or web address] URL: [email or web address] Date: 01/10/2024 Prepared by: Ozell Merari Pion/ Exercises - Supine Shoulder Flexion AAROM with Hands Clasped  - 3 x daily -  7 x weekly - 1 sets - 10 reps - 3 hold - Seated Scapular Retraction  - 3 x daily - 7 x weekly - 1 sets - 10 reps - 3 hold - Seated Shoulder Flexion Towel Slide at Table Top  - 3 x daily - 7 x weekly - 1 sets - 10 reps - 3 hold - Circular Shoulder Pendulum with Table Support  - 3 x daily - 7 x weekly - 1 sets - 10 reps - 3 hold - Seated Elbow Extension and Shoulder External Rotation AAROM at Table with Towel  - 3 x daily - 7 x weekly - 1 sets - 10 reps - 3 hold - Seated Shoulder Flexion Self PROM  - 3 x daily - 7 x weekly - 1 sets - 10 reps - 3 hold - Seated Shoulder Shrug Circles AROM Forward  - 3 x daily - 7 x weekly - 1 sets - 10 reps - 3 hold  ASSESSMENT:  CLINICAL IMPRESSION: Patient is a 74 y.o. female who was seen today for physical therapy treatment for fracture of the right humeral greater tuberosity. She reports that she has been sore since the last treatment, she reports pain in the anterior upper arm.  I did a few quick tests, biceps is intact, has good ER/IR strength, drop arm and empty can test had good strength but some pain.  She ha a small knot in the right biceps area that is tender.  We changed the exercises today to be lighter and less  stressful, when doing the rows she would completely relax the mm and would get a pop, had her do without continued tension  OBJECTIVE IMPAIRMENTS: cardiopulmonary status limiting activity, decreased activity tolerance, decreased coordination, decreased endurance, decreased ROM, decreased strength, decreased safety awareness, increased muscle spasms, impaired flexibility, impaired UE functional use, improper body mechanics, postural dysfunction, and pain.   REHAB POTENTIAL: Good  CLINICAL DECISION MAKING: Evolving/moderate complexity  EVALUATION COMPLEXITY: Low   GOALS: Goals reviewed with patient? Yes  SHORT TERM GOALS: Target date: 01/25/24  Independent with initial HEP Baseline: Goal status: met 02/23/24  LONG TERM GOALS: Target date: 03/21/24  Independent with advanced HEP Baseline:  Goal status: will progress this at the next visit  2.  Be able to do hair on her own Baseline: husband or hair dresser is doing it Goal status: Imet 01/26/24  3.  Decrease pain overall 50% Baseline: pain up to 7/10 with motions Goal status: progressing 02/01/24  4.  Improve AROM of the right shoulder to 150 degrees flexion Baseline: 130 PROM Goal status: progressing 02/15/24  5.  Reach over head with 5# Baseline: unable  Goal status: progressing 02/23/24  PLAN:  PT FREQUENCY: 1-2x/week  PT DURATION: 12 weeks  PLANNED INTERVENTIONS: [id number]- PT Re-evaluation, 97110-Therapeutic exercises, 97530- Therapeutic activity, 97112- Neuromuscular re-education, 97535- Self Care, [id number]- Manual therapy, [id number]- Electrical stimulation (unattended), Patient/Family education, Taping, Joint mobilization, Cryotherapy, and Moist heat  PLAN FOR NEXT SESSION:  see how backing down did,  Keyandra Swenson W, PT 02/23/2024, 3:23 PM  "

## 2024-02-25 ENCOUNTER — Ambulatory Visit: Admitting: Plastic Surgery

## 2024-02-28 NOTE — Progress Notes (Signed)
 " Psychiatric Initial Adult Assessment   Patient Identification: [name] MRN:  [id number] Date of Evaluation:  03/13/2024 Referral Source: PCP Chief Complaint:   Chief Complaint  Patient presents with   Establish Care   Visit Diagnosis:    ICD-10-CM   1. Other insomnia  G47.09 mirtazapine  (REMERON ) 7.5 MG tablet    2. Anxiety with depression  F41.8 mirtazapine  (REMERON ) 7.5 MG tablet       Assessment:  [name] is a 74 y.o. female with a history of MDD, GAD, Insomnia who presents in person to [facility] for initial evaluation on 03/13/2024.    At initial evaluation patient reports an improvement in her sleep quality and duration for which she was initially referred for.  She has been sleeping adequately each night, on an average about 6 to 7 hours, does have nighttime awakenings to void bladder, however is able to go back to sleep and feels refreshed in the morning.  She is not feeling depressed or anxious, had adjustment disorder prior due to the psychosocial stressors related to ongoing health concerns in relation to herself and her husband however has not felt depressed recently over the past 1 month.  She has enough energy to do tasks throughout the day and has felt positive.  She does not use any substances including alcohol or cigarettes.  She is not actively passively homicidal or suicidal, her appetite has improved and she has recently gained weight back to her normal self.  There are no safety concerns.  She has been getting her prescription from her primary care provider however wanted to get a second opinion about medications, alternatives and dosage.  We discussed the risk benefits of Remeron , counseled against the use of Ambien and Xanax on which she has been in the past.  We also discussed therapy options however the patient declined and she would reach out in future if she needed therapy appointments.  She does have a good support  system at home including her husband, good insight into her condition and good coping skills.  She will reach out to the clinic if she needed any further appointments and will manage her medications through her primary care provider.   Risk Assessment: A suicide and violence risk assessment was performed as part of this evaluation. There patient is deemed to be at chronic elevated risk for self-harm/suicide given the following factors: history of depression. These risk factors are mitigated by the following factors: lack of active SI/HI, no known access to weapons or firearms, no history of previous suicide attempts, and no history of violence. The patient is deemed to be at chronic elevated risk for violence given the following factors: N/A. These risk factors are mitigated by the following factors: no known history of violence towards others, no known violence towards others in the last 6 months, no known history of threats of harm towards others, no known homicidal ideation in the last 6 months, no command hallucinations to harm others in the last 6 months, no active symptoms of psychosis, and no active symptoms of mania. There is no acute risk for suicide or violence at this time. The patient was educated about relevant modifiable risk factors including following recommendations for treatment of psychiatric illness and abstaining from substance abuse.  While future psychiatric events cannot be accurately predicted, the patient does not currently require  acute inpatient psychiatric care and does not currently meet [facility]  involuntary commitment criteria.  Patient was  given contact information for crisis resources, behavioral health clinic and was instructed to call 911 for emergencies.    Plan: # Insomnia, unspecified Past medication trials: Ativan , Ambien, trazodone  Status of problem: New to the writer Interventions: -- Continue Remeron  7.5 mg nightly, currently taking 3.75 mg, can increase  up to the regular dose of 7.5 mg.  # MDD/GAD Past medication trials: Zoloft , Lexapro , buspirone  Status of problem: New to the writer Interventions: -- Continue Remeron  7.5 mg nightly, patient only takes 3.75 mg -- Patient declined any therapy options   History of Present Illness:   Ms. Len is a 74 year old female with a history of MDD, GAD, insomnia that presented to the clinic today after the referral of her primary care provider for sleep issues.  She reported that I am doing very well now, reported that issue was around September 9 my husband was having a heart attack at the beach, we found out that he was doing okay on Thursday, I woke up in the morning and found blood in my urine, had a panic attack, even at the hospital .  Reported that chemical balance was a problem back.  Stated that she went to see urology and was found to have a kidney stone that was eventually removed after 2 weeks.  Stated that in October she was back to beach I was having a good time , and on October 17 I fell in the garage , reported that she went to [facility] and had a right arm fracture and was in a sling for 6 weeks.  Stated that during this time I lost a lot of weight from 112 206 pounds .  Reported that during this time they tried to get me sleep, I was on Ambien, became a Xanax for sleep .  Reported that she had difficult time in December to go to bed I was in and out of doctor's office .  Stated that she was eventually started on Remeron  7.5 mg I take half of it, I cannot tolerate medicines , reported that she has been sleeping very well I even gained my weight back to 114 pounds.  Reported that currently she sleeps well, wakes up occasionally to void bladder, is able to go back to sleep and feels refreshed in the morning. When asked about depression I do not feel depressed anymore, I am happy , when asked about anxiety she stated no, denied any panic attacks.  She denied any safety  concerns, denied any active or passive SI/HI/AVH.  Reported that in addition to this she has been taking atenolol  one fourth of a pill in the morning it helps my heart slow down .  Reported that her PCP is managing it.  Stated that I am feeling to enjoy my life . She denied using any substances including alcohol or cigarettes.  She denied any concerns at home, reported that her husband is having a surgery on Wednesday .  Stated that she has to drive him to the hospital has been taking little more than half of pill of Remeron .  She denied any side effects.  We discussed the risk benefits of the medication, encouraged her to be compliant for sleep.  She declined any therapy options. We discussed a follow-up however she stated that she will call if she needed a further follow-up.   Associated Signs/Symptoms: Depression Symptoms:  None (Hypo) Manic Symptoms:  None Anxiety Symptoms:  Excessive Worry, Psychotic Symptoms:  None PTSD Symptoms: None  Past Psychiatric History:  Past psychiatric diagnoses: Gad, MDD, Insomnia Psychiatric hospitalizations: None Past suicide attempts: Denies Hx of self harm: Denies Hx of violence towards others: Denies Prior psychiatric providers: None Prior therapy: None Access to firearms: Denies  Prior medication trials: Mirtazapine  7.5 mg, trazodone  50 mg, Ambien, Effexor 25 mg, Zoloft  50 mg, Lexapro  5 mg, Lorazepam  1 mg, Buspirone  7.5 mg,   Substance use: Denies  Past Medical History:  Past Medical History:  Diagnosis Date   Anal fissure    Anemia    during pregnancy   Anxiety    Basal cell carcinoma    DDD (degenerative disc disease)    Diverticulosis    Hypokalemia    LBP (low back pain)    Melanoma (HCC)    Migraine    MVP (mitral valve prolapse) 1990   psa   Rosacea    Ulcer    as a child    Past Surgical History:  Procedure Laterality Date   ABDOMINAL HYSTERECTOMY  1985   BREAST BIOPSY Left    secondary to infection   I & D  EXTREMITY Right 09/09/2012   Procedure: Minor IRRIGATION AND DEBRIDEMENT EXTREMITY paronychia of right thumb;  Surgeon: Arley JONELLE Curia, MD;  Location: [facility];  Service: Orthopedics;  Laterality: Right;   I & D EXTREMITY Left 01/26/2014   Procedure: MINOR IINCISION AND DRAINAGE paronychia left index finger;  Surgeon: Arley Curia, MD;  Location: [facility];  Service: Orthopedics;  Laterality: Left;  left index   OOPHORECTOMY Left 1994   REPAIR KNEE LIGAMENT Left 1989   TONSILLECTOMY  1955   TUBAL LIGATION     VEIN SURGERY      Family Psychiatric History: Nothing significant  Family History:  Family History  Problem Relation Age of Onset   Heart disease Mother    Colon polyps Mother    Heart disease Father    Colitis Father    Colon polyps Father    Esophageal cancer Maternal Aunt    Colon cancer Neg Hx     Social History:   Social History   Socioeconomic History   Marital status: Married    Spouse name: Not on file   Number of children: 2   Years of education: Not on file   Highest education level: Not on file  Occupational History   Occupation: retired school system  Tobacco Use   Smoking status: Never   Smokeless tobacco: Never  Vaping Use   Vaping status: Never Used  Substance and Sexual Activity   Alcohol use: Not Currently    Alcohol/week: 1.0 standard drink of alcohol    Types: 1 Standard drinks or equivalent per week   Drug use: No   Sexual activity: Yes  Other Topics Concern   Not on file  Social History Narrative   Not on file   Social Drivers of Health   Tobacco Use: Low Risk (03/08/2024)   Patient History    Smoking Tobacco Use: Never    Smokeless Tobacco Use: Never    Passive Exposure: Not on file  Financial Resource Strain: Low Risk (12/07/2022)   Overall Financial Resource Strain (CARDIA)    Difficulty of Paying Living Expenses: Not hard at all  Food Insecurity: Low Risk (02/24/2023)   Received from [facility]    Epic    Within the past 12 months, you worried that your food would run out before you got money to buy more: Never true    Within the  past 12 months, the food you bought just didn't last and you didn't have money to get more. : Never true  Transportation Needs: No Transportation Needs (02/24/2023)   Received from [facility]    In the past 12 months, has lack of reliable transportation kept you from medical appointments, meetings, work or from getting things needed for daily living? : No  Physical Activity: Sufficiently Active (12/07/2022)   Exercise Vital Sign    Days of Exercise per Week: 3 days    Minutes of Exercise per Session: 120 min  Stress: No Stress Concern Present (12/07/2022)   [employer] of Occupational Health - Occupational Stress Questionnaire    Feeling of Stress : Not at all  Social Connections: Moderately Integrated (12/07/2022)   Social Connection and Isolation Panel    Frequency of Communication with Friends and Family: More than three times a week    Frequency of Social Gatherings with Friends and Family: Once a week    Attends Religious Services: More than 4 times per year    Active Member of Clubs or Organizations: No    Attends Banker Meetings: Never    Marital Status: Married  Depression (PHQ2-9): Low Risk (12/13/2023)   Depression (PHQ2-9)    PHQ-2 Score: 3  Recent Concern: Depression (PHQ2-9) - Medium Risk (12/09/2023)   Depression (PHQ2-9)    PHQ-2 Score: 9  Alcohol Screen: Low Risk (12/07/2022)   Alcohol Screen    Last Alcohol Screening Score (AUDIT): 2  Housing: Low Risk (02/24/2023)   Received from [facility]   Epic    What is your living situation today?: I have a steady place to live    Think about the place you live. Do you have problems with any of the following? Choose all that apply:: None/None on this list  Utilities: Low Risk (02/24/2023)   Received from [facility]   Utilities    In the past 12  months has the electric, gas, oil, or water company threatened to shut off services in your home? : No  Health Literacy: Adequate Health Literacy (12/07/2022)   B1300 Health Literacy    Frequency of need for help with medical instructions: Never    Additional Social History: She lives at home with her husband.  Allergies:  Allergies[1]  Metabolic Disorder Labs: Lab Results  Component Value Date   HGBA1C 5.8 12/27/2023   No results found for: PROLACTIN Lab Results  Component Value Date   CHOL 159 12/27/2023   TRIG 89 12/27/2023   HDL 80 (A) 12/27/2023   CHOLHDL 2.1 09/03/2023   VLDL 78.5 02/19/2023   LDLCALC 56 09/03/2023   LDLCALC 58 02/19/2023   Lab Results  Component Value Date   TSH 2.50 11/15/2023    Therapeutic Level Labs: No results found for: LITHIUM No results found for: CBMZ No results found for: VALPROATE  Current Medications: Current Outpatient Medications  Medication Sig Dispense Refill   atenolol  (TENORMIN ) 25 MG tablet Take 0.5 tablets (12.5 mg total) by mouth daily as needed. 30 tablet 3   clobetasol ointment (TEMOVATE) 0.05 % Apply 1 Application topically 2 (two) times daily.     Coenzyme Q10 (CO Q10) 100 MG CAPS Take by mouth daily.     estradiol (ESTRACE) 0.5 MG tablet Take 0.5 mg by mouth daily.     hydrocortisone  (ANUSOL -HC) 2.5 % rectal cream Place 1 Application rectally 2 (two) times daily. 30 g 0   meclizine  (ANTIVERT ) 12.5 MG  tablet TAKE 1 TABLET BY MOUTH EVERY 6 -12 HOURS AS NEEDED FOR DIZZINESS 30 tablet 1   metFORMIN  (GLUCOPHAGE -XR) 500 MG 24 hr tablet TAKE 1 TABLET BY MOUTH AT BEDTIME (Patient not taking: Reported on 01/13/2024) 90 tablet 2   mirtazapine  (REMERON ) 7.5 MG tablet Take 1 tablet (7.5 mg total) by mouth at bedtime. 30 tablet 2   Multiple Vitamin (MULTIVITAMIN) tablet Take 1 tablet by mouth daily.     nystatin cream (MYCOSTATIN) Apply 1 Application topically daily at 6 (six) AM.     omeprazole  (PRILOSEC) 20 MG capsule Take  20 mg by mouth daily.     ondansetron  (ZOFRAN ) 4 MG tablet Take 1 tablet (4 mg total) by mouth every 8 (eight) hours as needed for nausea or vomiting. 20 tablet 0   RESTASIS 0.05 % ophthalmic emulsion      rosuvastatin  (CRESTOR ) 10 MG tablet TAKE 1 TABLET BY MOUTH ONCE DAILY **MUST SCHEDULE AND ATTEND ANNUAL APPOINTMENT FOR FUTURE REFILLS 1ST ATTEMPT 90 tablet 3   SAMBUCOL BLACK ELDERBERRY PO Take 1 capsule by mouth 2 (two) times a week. (Patient not taking: Reported on 01/13/2024)     silodosin (RAPAFLO) 4 MG CAPS capsule Take 4 mg by mouth daily. (Patient not taking: Reported on 01/13/2024)     TRYPTYR 0.003 % SOLN      vitamin B-12 (CYANOCOBALAMIN ) 50 MCG tablet Take 50 mcg by mouth daily.     zolmitriptan  (ZOMIG -ZMT) 2.5 MG disintegrating tablet TAKE 1 TABLET BY MOUTH AS NEEDED FOR MIGRAINE 10 tablet 4   No current facility-administered medications for this visit.    Musculoskeletal: Strength & Muscle Tone: within normal limits Gait & Station: normal Patient leans: N/A  Psychiatric Specialty Exam:  Psychiatric Specialty Exam: There were no vitals taken for this visit.There is no height or weight on file to calculate BMI. Review of Systems  General Appearance: Casual and Fairly Groomed  Eye Contact:  Good  Speech:  Clear and Coherent  Volume:  Normal  Mood:  Euthymic  Affect:  Congruent  Thought Content: Logical   Suicidal Thoughts:  No  Homicidal Thoughts:  No  Thought Process:  Linear  Orientation:  Full (Time, Place, and Person)    Memory: Immediate;   Fair Recent;   Fair  Judgment:  Good  Insight:  Good  Concentration:  Concentration: Good and Attention Span: Fair  Recall:  not formally assessed   Fund of Knowledge: Fair  Language: Good  Psychomotor Activity:  Normal  Akathisia:  No  AIMS (if indicated): not done  Assets:  Communication Skills Desire for Improvement Financial Resources/Insurance Housing Intimacy Resilience Social  Support Transportation Vocational/Educational  ADL's:  Intact  Cognition: WNL  Sleep:  Fair    Screenings: GAD-7    Garment/textile Technologist Visit from 12/13/2023 in [facility] [employer] Visit from 12/09/2023 in [facility] [employer] Visit from 05/20/2022 in [facility] [employer] Visit from 05/06/2021 in [facility] at [employer] Visit from 04/15/2020 in [facility] [employer]  Total GAD-7 Score 16 17 5 6 14    PHQ2-9    Flowsheet Row Office Visit from 12/13/2023 in [facility] Visit from 12/09/2023 in [facility] [employer] Visit from 11/15/2023 in [facility] [employer] Clinical Support from 12[facility]07/2022 in [facility]  [employer] from 08/27/2022 in [facility] at [employer]  PHQ-2 Total Score 1 2 0 0 0  PHQ-9 Total Score 3 9 -- 0 --   Flowsheet Row ED from 12/30/2023 in [facility] at [facility] ED from 11/26/2023 in [facility] at [facility] ED from 11/11/2023 in [facility] at [facility]  C-SSRS RISK CATEGORY No Risk No Risk No Risk     Collaboration of Care: Medication Management AEB Dr Carvin, PCP notes  Patient/Guardian was advised Release of Information must be obtained prior to any record release in order to collaborate their care with an outside provider. Patient/Guardian was advised if they have not already done so to contact the registration department to sign all necessary forms in order for us  to release information regarding their care.   Consent: Patient/Guardian gives verbal consent for treatment and assignment of benefits for services provided during  this visit. Patient/Guardian expressed understanding and agreed to proceed.   Jontae Sonier, MD 2/2/20261:57 PM     [1]  Allergies Allergen Reactions   Ambien [Zolpidem]    Codeine    Compazine    Gabapentin    Lexapro  [Escitalopram  Oxalate]     Nausea, lightheadedness, diarrhea, palpitations   Ondansetron  Hcl Other (See Comments)   Other Other (See Comments)   Prednisone     REACTION: makes heart beat hard \\T \ fast   Prochlorperazine Edisylate    Sulfa Antibiotics Other (See Comments)   Sulfonamide Derivatives    Alfuzosin Hcl Er Palpitations   "

## 2024-03-03 ENCOUNTER — Encounter: Payer: Self-pay | Admitting: Physical Therapy

## 2024-03-03 ENCOUNTER — Ambulatory Visit: Admitting: Physical Therapy

## 2024-03-03 DIAGNOSIS — M6281 Muscle weakness (generalized): Secondary | ICD-10-CM

## 2024-03-03 DIAGNOSIS — M25611 Stiffness of right shoulder, not elsewhere classified: Secondary | ICD-10-CM

## 2024-03-03 DIAGNOSIS — M25511 Pain in right shoulder: Secondary | ICD-10-CM

## 2024-03-03 NOTE — Therapy (Signed)
 " OUTPATIENT PHYSICAL THERAPY SHOULDER TREATMENT   Patient Name: [name] MRN: [id number] DOB:[birth year], 75 y.o., female Today's Date: 03/03/2024  END OF SESSION:  PT End of Session - 03/03/24 1018     Visit Number 7    Number of Visits 13    Date for Recertification  03/21/24    Authorization Type [employer]    PT Start Time 1014    PT Stop Time 1058    PT Time Calculation (min) 44 min    Activity Tolerance Patient tolerated treatment well    Behavior During Therapy WFL for tasks assessed/performed          Past Medical History:  Diagnosis Date   Anal fissure    Anemia    during pregnancy   Anxiety    Basal cell carcinoma    DDD (degenerative disc disease)    Diverticulosis    Hypokalemia    LBP (low back pain)    Melanoma (HCC)    Migraine    MVP (mitral valve prolapse) 1990   psa   Rosacea    Ulcer    as a child   Past Surgical History:  Procedure Laterality Date   ABDOMINAL HYSTERECTOMY  1985   BREAST BIOPSY Left    secondary to infection   I & D EXTREMITY Right 09/09/2012   Procedure: Minor IRRIGATION AND DEBRIDEMENT EXTREMITY paronychia of right thumb;  Surgeon: Arley JONELLE Curia, MD;  Location: [facility];  Service: Orthopedics;  Laterality: Right;   I & D EXTREMITY Left 01/26/2014   Procedure: MINOR IINCISION AND DRAINAGE paronychia left index finger;  Surgeon: Arley Curia, MD;  Location: [facility];  Service: Orthopedics;  Laterality: Left;  left index   OOPHORECTOMY Left 1994   REPAIR KNEE LIGAMENT Left 1989   TONSILLECTOMY  1955   TUBAL LIGATION     VEIN SURGERY     Patient Active Problem List   Diagnosis Date Noted   History of fall 12/02/2023   Post-viral cough syndrome 03/18/2023   Upper respiratory tract infection 02/26/2023   Nausea 02/26/2023   Pain of left hip 11/13/2022   Cervical strain 11/13/2022   Sciatica of right side 09/30/2022   B12 deficiency 09/30/2022   Anxiety 09/30/2022   Agatston coronary  artery calcium  score between 200 and 399 08/03/2022   Osteopenia of necks of both femurs 06/23/2022   Dupuytren's contracture of both hands 06/23/2022   Low TSH level 05/20/2022   Prediabetes 05/20/2022   Cellulitis of finger of right hand 09/08/2021   Infection of skin 05/13/2021   Cervicalgia 05/06/2021   Grieving 05/06/2021   Conductive hearing loss of right ear 03/04/2021   Impacted cerumen of right ear 03/04/2021   Medication side effect 03/03/2021   Excessive cerumen in right ear canal 03/03/2021   Labyrinthitis 07/30/2020   Palpitations 04/15/2020   Plantar fasciitis 04/15/2020   Dysthymia 04/15/2020   Vitamin D  deficiency 04/15/2020   Gastroesophageal reflux disease 04/15/2020   Insect bite of left foot 11/24/2019   Pustule 11/24/2019   Pain of right thumb 03/09/2019   External hemorrhoids 03/03/2018   Other chest pain 02/23/2018   Insomnia 02/24/2017   Healthcare maintenance 02/24/2017   Conjunctivitis 03/01/2014   Seborrheic keratosis, inflamed 01/05/2012   Menopausal syndrome 12/22/2011   Encounter for counseling 11/11/2011   Skin cancer 07/14/2011   Basal cell carcinoma of face 03/10/2011   Migraine 05/14/2007   IRRITABLE BOWEL SYNDROME 05/14/2007   History  of mitral valve prolapse 05/14/2007   Allergic rhinitis 04/07/2007   DERMATITIS DUE TO SOLAR RADIATION 04/07/2007   Premature beat 09/20/2006   DIVERTICULOSIS, COLON 04/13/2002    PCP: Elsie Lent  REFERRING PROVIDER: Debby Fireman  REFERRING DIAG: D57.798J- unspecified fx of upper end of R humerus   THERAPY DIAG:  Acute pain of right shoulder  Stiffness of right shoulder, not elsewhere classified  Muscle weakness (generalized)  Rationale for Evaluation and Treatment: Rehabilitation  ONSET DATE: 11/26/23  SUBJECTIVE:                                                                                                                                                                                       SUBJECTIVE STATEMENT: Patient reports that she really tried to not do much and rest lately, she reports that she is feeling better and less pain, pain a 1-2/10 most of the time, pain up to 6/10 toward the end of the day with activity Hand dominance: Right  PERTINENT HISTORY: 12/28/23--Dr. Kay reviewed/interpreted ap/outlet views of right shoulder showing proximal greater tuberosity fracture in good position and healing recommend gentle physical therapy to return rom and strength f/u in 4 weeks for recheck and repeat x-rays pt and family in agreement denies need for any pain medication   11/26/23- patient suffered a fall and hit the right side of her head and shoulder. Resulted in a non displaced fracture of the greater tuberosity of R humerus.   PAIN:  Are you having pain? Yes: NPRS scale: 0/10 Pain location: right shoulder Pain description: ache, tight, sore Aggravating factors: movements pain up to 6-7/10 Relieving factors: rest, pain can be 0/10  PRECAUTIONS: Other: no abduction  RED FLAGS: None   WEIGHT BEARING RESTRICTIONS: No  FALLS:  Has patient fallen in last 6 months? Yes. Number of falls 1  LIVING ENVIRONMENT: Lives with: lives with their family Lives in: House/apartment Stairs: No Has following equipment at home: None  OCCUPATION: retired  PLOF: Independent  PATIENT GOALS:feel better overall, be able to move the arm, do my hair  NEXT MD VISIT: 01/25/24  OBJECTIVE:  Note: Objective measures were completed at Evaluation unless otherwise noted.  DIAGNOSTIC FINDINGS:  Small humeral fracture, non displaced fracture of the greater tuberosity  PATIENT SURVEYS:  Quick dash 70.5%  COGNITION: Overall cognitive status: anxious     SENSATION: WFL  POSTURE: Fwd head, rounded shoulders  UPPER EXTREMITY ROM:   Passive ROM Right PROM eval Right AROM 01/26/24 Right AROM 02/15/24 Right  AROM 03/03/24  Shoulder flexion 130 133 140 145  Shoulder  extension      Shoulder abduction  95 pain 110  125  Shoulder adduction      Shoulder internal rotation 30 50 52 60  Shoulder external rotation 45 55 70 70  Elbow flexion      Elbow extension      Wrist flexion      Wrist extension      Wrist ulnar deviation      Wrist radial deviation      Wrist pronation      Wrist supination      (Blank rows = not tested)  UPPER EXTREMITY MMT:  not tested due to the fracture  MMT Right eval Left eval  Shoulder flexion    Shoulder extension    Shoulder abduction    Shoulder adduction    Shoulder internal rotation    Shoulder external rotation    Middle trapezius    Lower trapezius    Elbow flexion    Elbow extension    Wrist flexion    Wrist extension    Wrist ulnar deviation    Wrist radial deviation    Wrist pronation    Wrist supination    Grip strength (lbs)    (Blank rows = not tested)  SHOULDER SPECIAL TESTS: Not tested due to fracture  PALPATION:  Very tight and guarded to the upper trap and deltoids                                                                                                                             TREATMENT DATE:  03/03/24 UBE level 2 x 4 minutes Calf stretch Wall circles Wall slides 3# shrugs Red tband row Red tband extension 3# biceps Red tband ER Red tband IR Gentle STM to the right upper trap, right pectoral and right upper arm Gentle PROM right shoulder Measured AROM in standing  02/23/24 Nustep level 5 x 5 minutes 3# wate bar biceps 3# wate extension Calf stretch Ball vs wall approximation with isometric circles Wall slides/circles Yellow tband rows small motions trying to prevent popping Yellow tband ER with retraction 2# shrug with upper trap and levator stretches Gentle PROM Gentle STM to the right upper trap, rhomboid and biceps   02/15/24 UBE level 3 x 4 minutes 5# rows 3# biceps 4# shrugs Bike level 5 x 5 minutes Calf stretches Yellow tband ER Yellow tband  row Yellow tband ext 1# wall slides and circles On airex marches, ball toss and cone toe touches Passive stretch right shoulder  02/01/24 UBE level 3 x 4 minutes Bike Level 3 x 5 minutes 5# rows Calf stretches 15# HS curls 10# triceps 3# biceps 1# wall slides 2# wall circles Tried 5# chest press and this was too much Red tband row Red tband ext Red tband ER   01/26/24 UBE level 1 x 4 minutes 5# rows 2x10 10# lats 2x5 Red tband extension Yellow tband ERIR AAROM Ball pass around the waist Ball vs wall isometric circles Isometric ER/IR  01/18/24 NuStep L 4 x  4 min, 2 min LE only  AAROM Flex, Ext, IR 1lb WaTE x10  Sit to stands 2x10 Hs curls green 2x10 RUE Isometrics flex, Ext, IR, ER 2x5 w/ 3 sec holds  01/10/24 Evaluation   PATIENT EDUCATION: Education details: HEP/POC Person educated: Patient Education method: Programmer, Multimedia, Facilities Manager, Actor cues, Verbal cues, and Handouts Education comprehension: verbalized understanding  HOME EXERCISE PROGRAM: Access Code: [email or web address] URL: [email or web address] Date: 01/10/2024 Prepared by: Ozell Avyukt Cimo/ Exercises - Supine Shoulder Flexion AAROM with Hands Clasped  - 3 x daily - 7 x weekly - 1 sets - 10 reps - 3 hold - Seated Scapular Retraction  - 3 x daily - 7 x weekly - 1 sets - 10 reps - 3 hold - Seated Shoulder Flexion Towel Slide at Table Top  - 3 x daily - 7 x weekly - 1 sets - 10 reps - 3 hold - Circular Shoulder Pendulum with Table Support  - 3 x daily - 7 x weekly - 1 sets - 10 reps - 3 hold - Seated Elbow Extension and Shoulder External Rotation AAROM at Table with Towel  - 3 x daily - 7 x weekly - 1 sets - 10 reps - 3 hold - Seated Shoulder Flexion Self PROM  - 3 x daily - 7 x weekly - 1 sets - 10 reps - 3 hold - Seated Shoulder Shrug Circles AROM Forward  - 3 x daily - 7 x weekly - 1 sets - 10 reps - 3 hold  ASSESSMENT:  CLINICAL IMPRESSION: Patient is a 73 y.o. female who was seen  today for physical therapy treatment for fracture of the right humeral greater tuberosity. She reports that she tried to take it easier, she reports pain in the anterior upper arm. Her ROM is improved.  She has a small knot in the right biceps area that is tender.  Still guarded and tight in the upper trap and upper arm OBJECTIVE IMPAIRMENTS: cardiopulmonary status limiting activity, decreased activity tolerance, decreased coordination, decreased endurance, decreased ROM, decreased strength, decreased safety awareness, increased muscle spasms, impaired flexibility, impaired UE functional use, improper body mechanics, postural dysfunction, and pain.   REHAB POTENTIAL: Good  CLINICAL DECISION MAKING: Evolving/moderate complexity  EVALUATION COMPLEXITY: Low   GOALS: Goals reviewed with patient? Yes  SHORT TERM GOALS: Target date: 01/25/24  Independent with initial HEP Baseline: Goal status: met 02/23/24  LONG TERM GOALS: Target date: 03/21/24  Independent with advanced HEP Baseline:  Goal status: will progress this at the next visit  2.  Be able to do hair on her own Baseline: husband or hair dresser is doing it Goal status: Imet 01/26/24  3.  Decrease pain overall 50% Baseline: pain up to 7/10 with motions Goal status: progressing 02/01/24, progressing 03/03/24  4.  Improve AROM of the right shoulder to 150 degrees flexion Baseline: 130 PROM Goal status: progressing 02/15/24  5.  Reach over head with 5# Baseline: unable  Goal status: progressing 02/23/24  PLAN:  PT FREQUENCY: 1-2x/week  PT DURATION: 12 weeks  PLANNED INTERVENTIONS: 97164- PT Re-evaluation, 97110-Therapeutic exercises, 97530- Therapeutic activity, 97112- Neuromuscular re-education, 97535- Self Care, 02859- Manual therapy, G0283- Electrical stimulation (unattended), Patient/Family education, Taping, Joint mobilization, Cryotherapy, and Moist heat  PLAN FOR NEXT SESSION:  see how backing down  did,  Bobi Daudelin W, PT 03/03/2024, 10:19 AM  "

## 2024-03-06 ENCOUNTER — Telehealth: Payer: Self-pay | Admitting: Nurse Practitioner

## 2024-03-06 NOTE — Telephone Encounter (Signed)
 Copied from CRM #[id number]. Topic: Clinical - Medication Question >> Mar 06, 2024 10:05 AM Maisie C wrote: Reason for CRM: pt called to let pcp know she is doing well at 115lbs and has been gaining weight. She wants to know if she should start back taking the metormin and what time should she take it. Please call and advise.

## 2024-03-08 ENCOUNTER — Encounter: Payer: Self-pay | Admitting: Physical Therapy

## 2024-03-08 ENCOUNTER — Ambulatory Visit: Admitting: Physical Therapy

## 2024-03-08 DIAGNOSIS — M25611 Stiffness of right shoulder, not elsewhere classified: Secondary | ICD-10-CM

## 2024-03-08 DIAGNOSIS — M6281 Muscle weakness (generalized): Secondary | ICD-10-CM

## 2024-03-08 DIAGNOSIS — M25511 Pain in right shoulder: Secondary | ICD-10-CM | POA: Diagnosis not present

## 2024-03-08 NOTE — Therapy (Signed)
 " OUTPATIENT PHYSICAL THERAPY SHOULDER TREATMENT   Patient Name: [name] MRN: [id number] DOB:12-10-1950, 74 y.o., female Today's Date: 03/08/2024  END OF SESSION:  PT End of Session - 03/08/24 1612     Visit Number 8    Number of Visits 13    Date for Recertification  03/21/24    Authorization Type [employer]    PT Start Time 1612    PT Stop Time 1657    PT Time Calculation (min) 45 min    Activity Tolerance Patient tolerated treatment well    Behavior During Therapy WFL for tasks assessed/performed          Past Medical History:  Diagnosis Date   Anal fissure    Anemia    during pregnancy   Anxiety    Basal cell carcinoma    DDD (degenerative disc disease)    Diverticulosis    Hypokalemia    LBP (low back pain)    Melanoma (HCC)    Migraine    MVP (mitral valve prolapse) 1990   psa   Rosacea    Ulcer    as a child   Past Surgical History:  Procedure Laterality Date   ABDOMINAL HYSTERECTOMY  1985   BREAST BIOPSY Left    secondary to infection   I & D EXTREMITY Right 09/09/2012   Procedure: Minor IRRIGATION AND DEBRIDEMENT EXTREMITY paronychia of right thumb;  Surgeon: Arley JONELLE Curia, MD;  Location: [facility];  Service: Orthopedics;  Laterality: Right;   I & D EXTREMITY Left 01/26/2014   Procedure: MINOR IINCISION AND DRAINAGE paronychia left index finger;  Surgeon: Arley Curia, MD;  Location: [facility];  Service: Orthopedics;  Laterality: Left;  left index   OOPHORECTOMY Left 1994   REPAIR KNEE LIGAMENT Left 1989   TONSILLECTOMY  1955   TUBAL LIGATION     VEIN SURGERY     Patient Active Problem List   Diagnosis Date Noted   History of fall 12/02/2023   Post-viral cough syndrome 03/18/2023   Upper respiratory tract infection 02/26/2023   Nausea 02/26/2023   Pain of left hip 11/13/2022   Cervical strain 11/13/2022   Sciatica of right side 09/30/2022   B12 deficiency 09/30/2022   Anxiety 09/30/2022   Agatston coronary  artery calcium  score between 200 and 399 08/03/2022   Osteopenia of necks of both femurs 06/23/2022   Dupuytren's contracture of both hands 06/23/2022   Low TSH level 05/20/2022   Prediabetes 05/20/2022   Cellulitis of finger of right hand 09/08/2021   Infection of skin 05/13/2021   Cervicalgia 05/06/2021   Grieving 05/06/2021   Conductive hearing loss of right ear 03/04/2021   Impacted cerumen of right ear 03/04/2021   Medication side effect 03/03/2021   Excessive cerumen in right ear canal 03/03/2021   Labyrinthitis 07/30/2020   Palpitations 04/15/2020   Plantar fasciitis 04/15/2020   Dysthymia 04/15/2020   Vitamin D  deficiency 04/15/2020   Gastroesophageal reflux disease 04/15/2020   Insect bite of left foot 11/24/2019   Pustule 11/24/2019   Pain of right thumb 03/09/2019   External hemorrhoids 03/03/2018   Other chest pain 02/23/2018   Insomnia 02/24/2017   Healthcare maintenance 02/24/2017   Conjunctivitis 03/01/2014   Seborrheic keratosis, inflamed 01/05/2012   Menopausal syndrome 12/22/2011   Encounter for counseling 11/11/2011   Skin cancer 07/14/2011   Basal cell carcinoma of face 03/10/2011   Migraine 05/14/2007   IRRITABLE BOWEL SYNDROME 05/14/2007   History  of mitral valve prolapse 05/14/2007   Allergic rhinitis 04/07/2007   DERMATITIS DUE TO SOLAR RADIATION 04/07/2007   Premature beat 09/20/2006   DIVERTICULOSIS, COLON 04/13/2002    PCP: Elsie Lent  REFERRING PROVIDER: Debby Fireman  REFERRING DIAG: D57.798J- unspecified fx of upper end of R humerus   THERAPY DIAG:  Acute pain of right shoulder  Stiffness of right shoulder, not elsewhere classified  Muscle weakness (generalized)  Rationale for Evaluation and Treatment: Rehabilitation  ONSET DATE: 11/26/23  SUBJECTIVE:                                                                                                                                                                                       SUBJECTIVE STATEMENT: Doing better overall, still hurting with reaching out to the side.  She saw hand surgeon and he is going to monitor the hand Hand dominance: Right  PERTINENT HISTORY: 12/28/23--Dr. Kay reviewed/interpreted ap/outlet views of right shoulder showing proximal greater tuberosity fracture in good position and healing recommend gentle physical therapy to return rom and strength f/u in 4 weeks for recheck and repeat x-rays pt and family in agreement denies need for any pain medication   11/26/23- patient suffered a fall and hit the right side of her head and shoulder. Resulted in a non displaced fracture of the greater tuberosity of R humerus.   PAIN:  Are you having pain? Yes: NPRS scale: 0/10 Pain location: right shoulder Pain description: ache, tight, sore Aggravating factors: movements pain up to 6-7/10 Relieving factors: rest, pain can be 0/10  PRECAUTIONS: Other: no abduction  RED FLAGS: None   WEIGHT BEARING RESTRICTIONS: No  FALLS:  Has patient fallen in last 6 months? Yes. Number of falls 1  LIVING ENVIRONMENT: Lives with: lives with their family Lives in: House/apartment Stairs: No Has following equipment at home: None  OCCUPATION: retired  PLOF: Independent  PATIENT GOALS:feel better overall, be able to move the arm, do my hair  NEXT MD VISIT: 01/25/24  OBJECTIVE:  Note: Objective measures were completed at Evaluation unless otherwise noted.  DIAGNOSTIC FINDINGS:  Small humeral fracture, non displaced fracture of the greater tuberosity  PATIENT SURVEYS:  Quick dash 70.5%  COGNITION: Overall cognitive status: anxious     SENSATION: WFL  POSTURE: Fwd head, rounded shoulders  UPPER EXTREMITY ROM:   Passive ROM Right PROM eval Right AROM 01/26/24 Right AROM 02/15/24 Right  AROM 03/03/24  Shoulder flexion 130 133 140 145  Shoulder extension      Shoulder abduction  95 pain 110 125  Shoulder adduction      Shoulder  internal rotation 30 50 52 60  Shoulder  external rotation 45 55 70 70  Elbow flexion      Elbow extension      Wrist flexion      Wrist extension      Wrist ulnar deviation      Wrist radial deviation      Wrist pronation      Wrist supination      (Blank rows = not tested)  UPPER EXTREMITY MMT:  not tested due to the fracture  MMT Right eval Left eval  Shoulder flexion    Shoulder extension    Shoulder abduction    Shoulder adduction    Shoulder internal rotation    Shoulder external rotation    Middle trapezius    Lower trapezius    Elbow flexion    Elbow extension    Wrist flexion    Wrist extension    Wrist ulnar deviation    Wrist radial deviation    Wrist pronation    Wrist supination    Grip strength (lbs)    (Blank rows = not tested)  SHOULDER SPECIAL TESTS: Not tested due to fracture  PALPATION:  Very tight and guarded to the upper trap and deltoids                                                                                                                             TREATMENT DATE:  03/08/24 Nsutep level 4 x 2 minutes c/o right upper arm pain UBE level 2 x 4 minutes Wall slides and circles Yellow tband ER Yellow tband IR Small yellow tband abduction Ball vs wall small circles with approximation Passive stretch right shoulder and arm 3# biceps Isometrics all right shoulder motions and showed her how she could replicate at home  03/03/24 UBE level 2 x 4 minutes Calf stretch Wall circles Wall slides 3# shrugs Red tband row Red tband extension 3# biceps Red tband ER Red tband IR Gentle STM to the right upper trap, right pectoral and right upper arm Gentle PROM right shoulder Measured AROM in standing  02/23/24 Nustep level 5 x 5 minutes 3# wate bar biceps 3# wate extension Calf stretch Ball vs wall approximation with isometric circles Wall slides/circles Yellow tband rows small motions trying to prevent popping Yellow tband ER with  retraction 2# shrug with upper trap and levator stretches Gentle PROM Gentle STM to the right upper trap, rhomboid and biceps   02/15/24 UBE level 3 x 4 minutes 5# rows 3# biceps 4# shrugs Bike level 5 x 5 minutes Calf stretches Yellow tband ER Yellow tband row Yellow tband ext 1# wall slides and circles On airex marches, ball toss and cone toe touches Passive stretch right shoulder  02/01/24 UBE level 3 x 4 minutes Bike Level 3 x 5 minutes 5# rows Calf stretches 15# HS curls 10# triceps 3# biceps 1# wall slides 2# wall circles Tried 5# chest press and this was too much Red tband row Red tband  ext Red tband ER   01/26/24 UBE level 1 x 4 minutes 5# rows 2x10 10# lats 2x5 Red tband extension Yellow tband ERIR AAROM Ball pass around the waist Ball vs wall isometric circles Isometric ER/IR  01/18/24 NuStep L 4 x 4 min, 2 min LE only  AAROM Flex, Ext, IR 1lb WaTE x10  Sit to stands 2x10 Hs curls green 2x10 RUE Isometrics flex, Ext, IR, ER 2x5 w/ 3 sec holds  01/10/24 Evaluation   PATIENT EDUCATION: Education details: HEP/POC Person educated: Patient Education method: Programmer, Multimedia, Facilities Manager, Actor cues, Verbal cues, and Handouts Education comprehension: verbalized understanding  HOME EXERCISE PROGRAM: Access Code: [email or web address] URL: [email or web address] Date: 01/10/2024 Prepared by: Ozell Deaire Mcwhirter/ Exercises - Supine Shoulder Flexion AAROM with Hands Clasped  - 3 x daily - 7 x weekly - 1 sets - 10 reps - 3 hold - Seated Scapular Retraction  - 3 x daily - 7 x weekly - 1 sets - 10 reps - 3 hold - Seated Shoulder Flexion Towel Slide at Table Top  - 3 x daily - 7 x weekly - 1 sets - 10 reps - 3 hold - Circular Shoulder Pendulum with Table Support  - 3 x daily - 7 x weekly - 1 sets - 10 reps - 3 hold - Seated Elbow Extension and Shoulder External Rotation AAROM at Table with Towel  - 3 x daily - 7 x weekly - 1 sets - 10 reps - 3 hold -  Seated Shoulder Flexion Self PROM  - 3 x daily - 7 x weekly - 1 sets - 10 reps - 3 hold - Seated Shoulder Shrug Circles AROM Forward  - 3 x daily - 7 x weekly - 1 sets - 10 reps - 3 hold  ASSESSMENT:  CLINICAL IMPRESSION: Patient is a 74 y.o. female who was seen today for physical therapy treatment for fracture of the right humeral greater tuberosity. She reports that she continues to have pain mostly with reaching out into abduction and with IR.  Her ROM is improved.  We started some isometrics today to help with pain and popping. OBJECTIVE IMPAIRMENTS: cardiopulmonary status limiting activity, decreased activity tolerance, decreased coordination, decreased endurance, decreased ROM, decreased strength, decreased safety awareness, increased muscle spasms, impaired flexibility, impaired UE functional use, improper body mechanics, postural dysfunction, and pain.   REHAB POTENTIAL: Good  CLINICAL DECISION MAKING: Evolving/moderate complexity  EVALUATION COMPLEXITY: Low   GOALS: Goals reviewed with patient? Yes  SHORT TERM GOALS: Target date: 01/25/24  Independent with initial HEP Baseline: Goal status: met 02/23/24  LONG TERM GOALS: Target date: 03/21/24  Independent with advanced HEP Baseline:  Goal status:progressing 03/08/24  2.  Be able to do hair on her own Baseline: husband or hair dresser is doing it Goal status: met 01/26/24  3.  Decrease pain overall 50% Baseline: pain up to 7/10 with motions Goal status: progressing 02/01/24, progressing 03/03/24  4.  Improve AROM of the right shoulder to 150 degrees flexion Baseline: 130 PROM Goal status: progressing 02/15/24  5.  Reach over head with 5# Baseline: unable  Goal status: progressing 02/23/24  PLAN:  PT FREQUENCY: 1-2x/week  PT DURATION: 12 weeks  PLANNED INTERVENTIONS: 97164- PT Re-evaluation, 97110-Therapeutic exercises, 97530- Therapeutic activity, 97112- Neuromuscular re-education, 97535- Self Care, 02859- Manual  therapy, G0283- Electrical stimulation (unattended), Patient/Family education, Taping, Joint mobilization, Cryotherapy, and Moist heat  PLAN FOR NEXT SESSION:  assess ROM and functional strength  Reece Fehnel W, PT 03/08/2024, 4:13 PM  "

## 2024-03-09 ENCOUNTER — Ambulatory Visit: Admitting: Physical Therapy

## 2024-03-13 ENCOUNTER — Telehealth (HOSPITAL_COMMUNITY): Payer: Self-pay

## 2024-03-13 DIAGNOSIS — F418 Other specified anxiety disorders: Secondary | ICD-10-CM | POA: Diagnosis not present

## 2024-03-13 DIAGNOSIS — G4709 Other insomnia: Secondary | ICD-10-CM

## 2024-03-13 MED ORDER — MIRTAZAPINE 7.5 MG PO TABS: 7.5000 mg | ORAL_TABLET | Freq: Every day | ORAL | 2 refills | Status: AC

## 2024-03-14 ENCOUNTER — Encounter: Payer: Self-pay | Admitting: Physical Therapy

## 2024-03-14 ENCOUNTER — Ambulatory Visit: Admitting: Physical Therapy

## 2024-03-14 DIAGNOSIS — M542 Cervicalgia: Secondary | ICD-10-CM

## 2024-03-14 DIAGNOSIS — M6281 Muscle weakness (generalized): Secondary | ICD-10-CM

## 2024-03-14 DIAGNOSIS — M25611 Stiffness of right shoulder, not elsewhere classified: Secondary | ICD-10-CM

## 2024-03-14 DIAGNOSIS — M25511 Pain in right shoulder: Secondary | ICD-10-CM

## 2024-03-20 ENCOUNTER — Ambulatory Visit: Admitting: Physical Therapy

## 2024-04-11 ENCOUNTER — Ambulatory Visit: Admitting: Family Medicine

## 2024-04-18 ENCOUNTER — Ambulatory Visit: Admitting: Plastic Surgery
